# Patient Record
Sex: Female | Born: 1945 | Race: Black or African American | Hispanic: No | Smoking: Former smoker
Health system: Southern US, Community
[De-identification: ages and names within clinical notes are randomized; demographics above are authoritative.]

## PROBLEM LIST (undated history)

## (undated) DIAGNOSIS — E782 Mixed hyperlipidemia: Secondary | ICD-10-CM

## (undated) DIAGNOSIS — G4733 Obstructive sleep apnea (adult) (pediatric): Secondary | ICD-10-CM

## (undated) DIAGNOSIS — E119 Type 2 diabetes mellitus without complications: Secondary | ICD-10-CM

## (undated) DIAGNOSIS — Z9989 Dependence on other enabling machines and devices: Secondary | ICD-10-CM

## (undated) DIAGNOSIS — G4730 Sleep apnea, unspecified: Secondary | ICD-10-CM

## (undated) DIAGNOSIS — I10 Essential (primary) hypertension: Secondary | ICD-10-CM

## (undated) DIAGNOSIS — H269 Unspecified cataract: Secondary | ICD-10-CM

## (undated) DIAGNOSIS — E611 Iron deficiency: Secondary | ICD-10-CM

## (undated) DIAGNOSIS — R011 Cardiac murmur, unspecified: Secondary | ICD-10-CM

## (undated) DIAGNOSIS — D649 Anemia, unspecified: Secondary | ICD-10-CM

## (undated) DIAGNOSIS — E041 Nontoxic single thyroid nodule: Secondary | ICD-10-CM

## (undated) DIAGNOSIS — M1990 Unspecified osteoarthritis, unspecified site: Secondary | ICD-10-CM

## (undated) DIAGNOSIS — N189 Chronic kidney disease, unspecified: Secondary | ICD-10-CM

## (undated) HISTORY — DX: Obstructive sleep apnea (adult) (pediatric): G47.33

## (undated) HISTORY — DX: Unspecified cataract: H26.9

## (undated) HISTORY — DX: Anemia, unspecified: D64.9

## (undated) HISTORY — DX: Nontoxic single thyroid nodule: E04.1

## (undated) HISTORY — DX: Cardiac murmur, unspecified: R01.1

## (undated) HISTORY — DX: Essential (primary) hypertension: I10

## (undated) HISTORY — DX: Type 2 diabetes mellitus without complications: E11.9

## (undated) HISTORY — PX: THYROID SURGERY: SHX805

## (undated) HISTORY — DX: Iron deficiency: E61.1

## (undated) HISTORY — PX: EYE SURGERY: SHX253

## (undated) HISTORY — DX: Dependence on other enabling machines and devices: Z99.89

## (undated) HISTORY — PX: JOINT REPLACEMENT: SHX530

## (undated) HISTORY — DX: Mixed hyperlipidemia: E78.2

## (undated) HISTORY — DX: Chronic kidney disease, unspecified: N18.9

## (undated) HISTORY — DX: Sleep apnea, unspecified: G47.30

## (undated) HISTORY — DX: Unspecified osteoarthritis, unspecified site: M19.90

---

## 1994-10-14 HISTORY — PX: ABDOMINAL HYSTERECTOMY: SHX81

## 2003-01-03 ENCOUNTER — Encounter: Admission: RE | Admit: 2003-01-03 | Discharge: 2003-04-03 | Payer: Self-pay | Admitting: Internal Medicine

## 2003-05-24 ENCOUNTER — Encounter: Admission: RE | Admit: 2003-05-24 | Discharge: 2003-08-22 | Payer: Self-pay | Admitting: Internal Medicine

## 2006-04-10 ENCOUNTER — Emergency Department (HOSPITAL_COMMUNITY): Admission: EM | Admit: 2006-04-10 | Discharge: 2006-04-10 | Payer: Self-pay | Admitting: Emergency Medicine

## 2010-10-18 ENCOUNTER — Encounter
Admission: RE | Admit: 2010-10-18 | Discharge: 2010-10-18 | Payer: Self-pay | Source: Home / Self Care | Attending: Family Medicine | Admitting: Family Medicine

## 2010-12-10 ENCOUNTER — Other Ambulatory Visit: Payer: Self-pay | Admitting: Endocrinology

## 2010-12-10 DIAGNOSIS — E041 Nontoxic single thyroid nodule: Secondary | ICD-10-CM

## 2011-01-08 ENCOUNTER — Other Ambulatory Visit: Payer: Self-pay | Admitting: Interventional Radiology

## 2011-01-08 ENCOUNTER — Other Ambulatory Visit (HOSPITAL_COMMUNITY)
Admission: RE | Admit: 2011-01-08 | Discharge: 2011-01-08 | Disposition: A | Discharge status: Home / Self Care | Payer: PRIVATE HEALTH INSURANCE | Source: Ambulatory Visit | Attending: Interventional Radiology | Admitting: Interventional Radiology

## 2011-01-08 ENCOUNTER — Ambulatory Visit
Admission: RE | Admit: 2011-01-08 | Discharge: 2011-01-08 | Disposition: A | Discharge status: Home / Self Care | Payer: PRIVATE HEALTH INSURANCE | Source: Ambulatory Visit | Attending: Endocrinology | Admitting: Endocrinology

## 2011-01-08 DIAGNOSIS — E041 Nontoxic single thyroid nodule: Secondary | ICD-10-CM

## 2011-06-18 ENCOUNTER — Encounter (HOSPITAL_COMMUNITY)
Admission: RE | Admit: 2011-06-18 | Discharge: 2011-06-18 | Disposition: A | Discharge status: Home / Self Care | Payer: 59 | Source: Ambulatory Visit | Attending: Otolaryngology | Admitting: Otolaryngology

## 2011-06-18 ENCOUNTER — Other Ambulatory Visit (HOSPITAL_COMMUNITY): Payer: Self-pay | Admitting: Otolaryngology

## 2011-06-18 DIAGNOSIS — D34 Benign neoplasm of thyroid gland: Secondary | ICD-10-CM

## 2011-06-18 LAB — BASIC METABOLIC PANEL
BUN: 11 mg/dL (ref 6–23)
CO2: 31 mEq/L (ref 19–32)
Calcium: 10.6 mg/dL — ABNORMAL HIGH (ref 8.4–10.5)
Chloride: 97 mEq/L (ref 96–112)
Creatinine, Ser: 0.77 mg/dL (ref 0.50–1.10)
GFR calc Af Amer: >60 mL/min (ref >60)
GFR calc non Af Amer: >60 mL/min (ref >60)
Glucose, Bld: 209 mg/dL — ABNORMAL HIGH (ref 70–99)
Potassium: 3.6 mEq/L (ref 3.5–5.1)
Sodium: 137 mEq/L (ref 135–145)

## 2011-06-18 LAB — CBC
HCT: 39.9 % (ref 36.0–46.0)
Hemoglobin: 13 g/dL (ref 12.0–15.0)
MCH: 28.1 pg (ref 26.0–34.0)
MCHC: 32.6 g/dL (ref 30.0–36.0)
MCV: 86.4 fL (ref 78.0–100.0)
Platelets: 204 10*3/uL (ref 150–400)
RBC: 4.62 MIL/uL (ref 3.87–5.11)
RDW: 12.1 % (ref 11.5–15.5)
WBC: 3.2 10*3/uL — ABNORMAL LOW (ref 4.0–10.5)

## 2011-06-18 LAB — SURGICAL PCR SCREEN
MRSA, PCR: NEGATIVE
Staphylococcus aureus: NEGATIVE

## 2011-06-19 ENCOUNTER — Other Ambulatory Visit: Payer: Self-pay | Admitting: Otolaryngology

## 2011-06-19 ENCOUNTER — Ambulatory Visit (HOSPITAL_COMMUNITY)
Admission: RE | Admit: 2011-06-19 | Discharge: 2011-06-20 | Disposition: A | Discharge status: Home / Self Care | Payer: 59 | Source: Ambulatory Visit | Attending: Otolaryngology | Admitting: Otolaryngology

## 2011-06-19 DIAGNOSIS — I10 Essential (primary) hypertension: Secondary | ICD-10-CM | POA: Insufficient documentation

## 2011-06-19 DIAGNOSIS — D34 Benign neoplasm of thyroid gland: Secondary | ICD-10-CM | POA: Insufficient documentation

## 2011-06-19 DIAGNOSIS — Z01818 Encounter for other preprocedural examination: Secondary | ICD-10-CM | POA: Insufficient documentation

## 2011-06-19 DIAGNOSIS — Z01812 Encounter for preprocedural laboratory examination: Secondary | ICD-10-CM | POA: Insufficient documentation

## 2011-06-19 DIAGNOSIS — E119 Type 2 diabetes mellitus without complications: Secondary | ICD-10-CM | POA: Insufficient documentation

## 2011-06-19 DIAGNOSIS — E063 Autoimmune thyroiditis: Secondary | ICD-10-CM | POA: Insufficient documentation

## 2011-06-19 DIAGNOSIS — Z01810 Encounter for preprocedural cardiovascular examination: Secondary | ICD-10-CM | POA: Insufficient documentation

## 2011-06-19 LAB — GLUCOSE, CAPILLARY
Glucose-Capillary: 230 mg/dL — ABNORMAL HIGH (ref 70–99)
Glucose-Capillary: 223 mg/dL — ABNORMAL HIGH (ref 70–99)

## 2011-06-20 LAB — GLUCOSE, CAPILLARY
Glucose-Capillary: 223 mg/dL — ABNORMAL HIGH (ref 70–99)
Glucose-Capillary: 205 mg/dL — ABNORMAL HIGH (ref 70–99)

## 2011-07-17 NOTE — Op Note (Signed)
  Catherine Mcconnell, Catherine Mcconnell                 ACCOUNT NO.:  [id number]  MEDICAL RECORD NO.:  [id number]  LOCATION:  5121                         FACILITY:  [facility]  PHYSICIAN:  Yuri Flener H. Pollyann Kennedy, MD     DATE OF BIRTH:  [birth year]  DATE OF PROCEDURE:  06/19/2011 DATE OF DISCHARGE:                              OPERATIVE REPORT   PREOPERATIVE DIAGNOSIS:  Left thyroid mass.  POSTOPERATIVE DIAGNOSIS:  Left thyroid mass.  PROCEDURE:  Left thyroid lobectomy.  SURGEON:  Khalee Mazo H. Pollyann Kennedy, MD  ASSISTANT:  Aquilla Hacker, [facility]  BLOOD LOSS:  None.  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  No complications.  No other findings.  REFERRING PHYSICIAN:  Dorisann Frames, MD.  HISTORY:  A 65 year old lady with a thyroid mass on the left side.  FNA revealed follicular cells with atypical cytology.  Risks, benefits, alternatives, complications to the procedure were explained to the patient who seemed to understand and agreed to surgery.  PROCEDURE:  The patient was taken to the operating room, placed on the operating table in supine position.  Following induction of general endotracheal anesthesia, the table was turned.  The patient was draped in a standard fashion.  A shoulder roll was placed beneath the shoulder blades.  The neck was extended.  A low collar incision was outlined with a marking pen.  Electrocautery was used to incise the skin and subcutaneous tissue and through the platysma layer.  Subplatysmal flap was developed superiorly and inferiorly allowing adequate visualization of the thyroid gland.  Midline fascia was divided and reflected laterally to the left exposing the thyroid lobe which was then medially retracted.  The superior vasculature was ligated between clamps and divided separately.  The superior pole was brought down, the recurrent nerve was identified and preserved.  A superior and inferior parathyroid was identified  and preserved with its blood supply.  The middle thyroid vein and the inferior vasculature was also ligated between clamps and divided.  A 4-0 silk ties were used throughout.  As the gland was brought forward, ligament of Allyson Sabal was divided and the gland was dissected off the trachea.  The isthmus was divided to the right at the midline.  The specimen sent for pathologic evaluation.  The wound was irrigated with saline and hemostasis was completed.  The wound was closed in layers using 4-0 chromic on the midline fascia, the platysma layer and the subcuticular layer.  A 7-French round JP drain was left in the wound, exited through the right side of the incision, secured in place with nylon suture. Dermabond was used on the skin.  The patient was awakened, extubated, and transferred to recovery in stable condition.     Arhum Peeples H. Pollyann Kennedy, MD     JHR/MEDQ  D:  06/19/2011  T:  06/19/2011  Job:  [id number]  cc:   Dorisann Frames, M.D.  Electronically Signed by Serena Colonel MD on 07/17/2011 09:30:44 PM

## 2011-10-10 ENCOUNTER — Ambulatory Visit (INDEPENDENT_AMBULATORY_CARE_PROVIDER_SITE_OTHER): Payer: PRIVATE HEALTH INSURANCE

## 2011-10-10 DIAGNOSIS — E789 Disorder of lipoprotein metabolism, unspecified: Secondary | ICD-10-CM

## 2011-10-10 DIAGNOSIS — I10 Essential (primary) hypertension: Secondary | ICD-10-CM

## 2011-10-10 DIAGNOSIS — E119 Type 2 diabetes mellitus without complications: Secondary | ICD-10-CM

## 2012-03-13 ENCOUNTER — Ambulatory Visit (INDEPENDENT_AMBULATORY_CARE_PROVIDER_SITE_OTHER): Payer: PRIVATE HEALTH INSURANCE | Admitting: Family Medicine

## 2012-03-13 VITALS — BP 152/80 | HR 72 | Temp 98.6°F | Resp 16 | Ht 66.0 in | Wt 159.2 lb

## 2012-03-13 DIAGNOSIS — E785 Hyperlipidemia, unspecified: Secondary | ICD-10-CM

## 2012-03-13 DIAGNOSIS — I10 Essential (primary) hypertension: Secondary | ICD-10-CM

## 2012-03-13 DIAGNOSIS — E119 Type 2 diabetes mellitus without complications: Secondary | ICD-10-CM

## 2012-03-13 DIAGNOSIS — IMO0001 Reserved for inherently not codable concepts without codable children: Secondary | ICD-10-CM

## 2012-03-13 LAB — COMPREHENSIVE METABOLIC PANEL
ALT: 20 U/L (ref 0–35)
AST: 18 U/L (ref 0–37)
Albumin: 4.7 g/dL (ref 3.5–5.2)
Alkaline Phosphatase: 58 U/L (ref 39–117)
BUN: 9 mg/dL (ref 6–23)
CO2: 31 mEq/L (ref 19–32)
Calcium: 10.3 mg/dL (ref 8.4–10.5)
Chloride: 99 mEq/L (ref 96–112)
Creat: 0.84 mg/dL (ref 0.50–1.10)
Glucose, Bld: 148 mg/dL — ABNORMAL HIGH (ref 70–99)
Potassium: 3.9 mEq/L (ref 3.5–5.3)
Sodium: 138 mEq/L (ref 135–145)
Total Bilirubin: 0.5 mg/dL (ref 0.3–1.2)
Total Protein: 7.7 g/dL (ref 6.0–8.3)

## 2012-03-13 LAB — LIPID PANEL
Cholesterol: 146 mg/dL (ref 0–200)
HDL: 52 mg/dL (ref >39)
LDL Cholesterol: 85 mg/dL (ref 0–99)
Total CHOL/HDL Ratio: 2.8 Ratio
Triglycerides: 43 mg/dL (ref <150)
VLDL: 9 mg/dL (ref 0–40)

## 2012-03-13 LAB — POCT GLYCOSYLATED HEMOGLOBIN (HGB A1C): Hemoglobin A1C: 10.3

## 2012-03-13 MED ORDER — METFORMIN HCL 1000 MG PO TABS: 1000.0000 mg | ORAL_TABLET | Freq: Two times a day (BID) | ORAL | Status: DC

## 2012-03-13 MED ORDER — AMLODIPINE BESYLATE 10 MG PO TABS: 10.0000 mg | ORAL_TABLET | Freq: Every day | ORAL | Status: DC

## 2012-03-13 MED ORDER — ROSUVASTATIN CALCIUM 20 MG PO TABS: 20.0000 mg | ORAL_TABLET | Freq: Every day | ORAL | Status: DC

## 2012-03-13 MED ORDER — SITAGLIPTIN PHOSPHATE 100 MG PO TABS: 100.0000 mg | ORAL_TABLET | Freq: Every day | ORAL | Status: DC

## 2012-03-13 NOTE — Patient Instructions (Addendum)
Try to be more faithful in taking her medications.  Continue getting regular exercise  It is good for a diabetic take aspirin 81 mg one daily in addition to the other medications.  When you get to [facility] please establish yourself with a regular physician in the near future. You need better diabetic control.

## 2012-03-13 NOTE — Progress Notes (Signed)
Subjective:  she is moving to [facility] tomorrow. She came in for one last visit here before moving. She has been not very faithful in taking her medications lately. She has a history of fairly poorly controlled diabetes in the past. She says her sugars have run in the 200s.  Review of systems is really unremarkable. HEENT no headaches or dizziness or other symptoms.  Respiratory unremarkable  cardiovascular unremarkable GI unremarkable  GU unremarkable  musculoskeletal unremarkable  Objective: Pleasant alert Afro-American female in no acute distress this time. HEENT: TMs are normal. Throat clear. Eyes PERRLA. Neck supple without nodes thyromegaly. Chest clear. Heart regular without murmurs. Soft and mass or tenderness. Ankles without edema.  Assessment:  Diabetes Hyperlipidemia   Plan: Represcribed her medications. Advise compliance.   Results for orders placed in visit on 03/13/12  POCT GLYCOSYLATED HEMOGLOBIN (HGB A1C)      Component Value Range   Hemoglobin A1C 10.3

## 2012-03-17 ENCOUNTER — Encounter: Payer: Self-pay | Admitting: Family Medicine

## 2012-03-22 ENCOUNTER — Telehealth: Payer: Self-pay | Admitting: Family Medicine

## 2012-03-22 NOTE — Telephone Encounter (Signed)
Received fax from [employer] (see TL wall), needing to verify patients new address and [employer] information.  LMOM for patient to CB.

## 2012-03-22 NOTE — Telephone Encounter (Signed)
Spoke with patient and verified  [employer] id - [id number] Patients address:  [address] [place], [place] [address] Phone: [phone]  Per ppwk, need to call [employer] Monday at [phone] to verify info.  Will leave forms at [place] station to contact them tomorrow.

## 2012-03-23 NOTE — Telephone Encounter (Signed)
Called [facility] [employer] and gave them pt info. They were able to find pt and update phone and address and will contact pt first also to verify shipping address. Gave pharmacist all Rx info from Dr Frederik Pear 5/31 Rxs

## 2013-03-26 ENCOUNTER — Telehealth: Payer: Self-pay

## 2013-03-26 NOTE — Telephone Encounter (Signed)
PT HAS MOVED TO [place].  WANTS [place] TO MAIL HER AN AUTHORIZATION TO RELEASE HER MEDICAL RECORDS.  NEW ADDRESS [address] [place], [place] [address]  [phone]

## 2013-03-30 NOTE — Telephone Encounter (Signed)
Done

## 2015-10-15 HISTORY — PX: MASS EXCISION: SHX2000

## 2018-01-20 LAB — LIPID PANEL
Cholesterol: 219 — AB (ref 0–200)
HDL: 58 (ref 35–70)
LDL Cholesterol: 139

## 2018-01-20 LAB — BASIC METABOLIC PANEL
BUN: 26 — AB (ref 4–21)
Creatinine: 1.9 — AB (ref 0.5–1.1)
Glucose: 316
Potassium: 4.2 (ref 3.4–5.3)
Sodium: 136 — AB (ref 137–147)

## 2018-03-19 ENCOUNTER — Encounter: Payer: Self-pay | Admitting: Neurology

## 2018-03-23 ENCOUNTER — Encounter: Payer: Self-pay | Admitting: Neurology

## 2018-03-23 ENCOUNTER — Ambulatory Visit: Payer: Medicare Other | Admitting: Neurology

## 2018-03-23 VITALS — BP 142/70 | HR 61 | Ht 67.0 in | Wt 164.0 lb

## 2018-03-23 DIAGNOSIS — Z9989 Dependence on other enabling machines and devices: Secondary | ICD-10-CM

## 2018-03-23 DIAGNOSIS — G4733 Obstructive sleep apnea (adult) (pediatric): Secondary | ICD-10-CM

## 2018-03-23 NOTE — Progress Notes (Signed)
SLEEP MEDICINE CLINIC   Provider:  Larey Seat, M.D.   Primary Care Physician:     Referring Provider: Not established.   Chief Complaint  Patient presents with  . New Patient (Initial Visit)    pt alone, rm 10. pt is an established CPAP user. last sleep study was 04/03/2017 and was started on CPAP at that time. DME: [facility]. pt moved here from [facility].     HPI:  Catherine Mcconnell is a 72 y.o. female , seen here as in a referral for transfer of care- The patient has just moved form [place], and has seen Dr. [name] at [facility]. She has not been established with a PCP.   I have the pleasure of meeting Catherine Mcconnell today on 23 March 2018.  She moved to [place] several months ago from.  She has a history of hypertension, diabetes mellitus, hyperlipidemia, chronic kidney disease, heart murmur and was evaluated in [facility] by [facility] [place].  She has an excellent functional status she can walk about a mile without any limitation no shortness of breath is recorded.  Her EKG done at her last primary care physician's office showed left ventricular hypertrophy with normal sinus rhythm.  The patient has no symptoms attributable to valvular heart disease.  An echocardiogram on 01 February 2017 showed normal left ventricular ejection fraction of 60%.  Normal left atrial pressure, minimal tricuspid regurgitation and pulmonary regurgitation, grade 1 diastolic dysfunction.  No functional limitations.  EKG in sinus rhythm as of 09 January 2017.  [facility] also ordered a sleep study on 21 Feb 2017 , resulting in an AHI of 57.3/h with O2 desaturation to a nadir of 81% during non-REM sleep.  CPAP therapy and weight reduction were recommended.  Her cardiologist was Dr. Manuella Ghazi, MD, her primary care physician was [name] , MD.  One of Dr. Trena Platt last office notes from May 20, 2017 stated that the patient's essential  primary hypertension -was controlled on Norvasc 10 mg daily and Coreg 6.25 mg twice daily, type 2 diabetes was managed by primary care, hyperlipidemia treated with Lipitor 40 mg at night, CKD with a creatinine of 1.62 is followed now by nephrologist Dr. [name], cardiac monitor was unspecified the patient was considered not a good candidate for dehydration or diuretics.   About a year ago she had a spell of dehydration and had to go to her local [facility] emergency room where she was IV hydrated.    Obstructive sleep apnea was treated with CPAP at 11 cmH2O pressure.  I told hear from her last sleep study from 12 Mar 2017 the patient's respirations stabilized on the optimal nasal CPAP pressure of 11 cmH2O.   The AHI was reduced to 0.0/h the EKG showed sinus rhythm with a variable heart rate between 44 bpm and 71 bpm snoring was eliminated with nasal CPAP under a DreamWear mask.  No significant periodic leg movement, REM latency was 88 minutes.  She uses medium small cushions no chinstrap and heated humidity.  Interval history the patient had very good compliance data in [facility] and we were able to also interrogate her machine by Wi-Fi.  Over the 90 day period from September through November 2018 she has used the machine 100% of days, with an average user time of 8 hours and 7 minutes, CPAP is set at 11 cmH2O pressure with 3 cm expiratory pressure relief -  her residual AHI in November was 1.1.    Today's compliance record again is 100% average use of time 7 hours 48 minutes, CPAP still set at 11 cmH2O pressure with 3 cm EPR and the residual AHI is 1.7 there were no Cheyne-Stokes respiration arising under therapy.  Air leak varies night by night, her residual apnea is considered low enough to be well controlled.  She continues to feel comfortable with a DreamWear nasal pillow mask.  Chief complaint according to patient :" I need new supplies."   Sleep habits are as follows: She goes to bed around 11  Pm, usually reading for 30-40 minutes before falling asleep, the bedroom is cool, quiet and dark. The patient sleeps on her side- but moves a lot during sleep. She sleeps on 2 pillows. She sleeps alone. 2-3 nocturias each night. She rises between 7-9 AM, mostly around 8 AM.  She feel rested in AM, no headaches, no palpitations, no lightheadedness were reported..     Medical history and family sleep history: HTN and DM 2 were diagnosed 20 years ago, she was of the same weight as now. No tonsillectomy, no nasal septal deviation. Father was a heavy snorer.   Social history: divorced, 6 biological children, 2 son have passed away , middle son in January 30, 2008 following a liver transplant, the eldest in 49 with pancreatic cancer, both were veterans. Non smoker- seldomly drinking alcohol, Caffeine - consumes iced tea and coffee, not sodas. 1 cup in AM, 1 glass of tea with lunch and dinner.  Worked as Training and development officer .     Review of Systems: Out of a complete 14 system review, the patient complains of only the following symptoms, and all other reviewed systems are negative.   Catherine Mcconnell reports swelling in her legs, snoring which may have been alleviated on the CPAP, anemia, joint pain, birthmarks, nocturia, changes in appetite and restless legs.  She has a past surgical history of has to hysterectomy in 1995-01-30 thyroid surgery a biopsy revealed a benign nodule.  And she had a skin mass removed from her right upper arm in January 30, 2016 which  was not cancerous.  Epworth Sleepiness score 13/ 24 - but takes no naps.   , Fatigue severity score 36  , depression score 3/ 5    Social History   Socioeconomic History  . Marital status: Divorced    Spouse name: Not on file  . Number of children: Not on file  . Years of education: Not on file  . Highest education level: Not on file  Occupational History  . Not on file  Social Needs  . Financial resource strain: Not on file  . Food insecurity:    Worry: Not on file      Inability: Not on file  . Transportation needs:    Medical: Not on file    Non-medical: Not on file  Tobacco Use  . Smoking status: Former Research scientist (life sciences)  . Smokeless tobacco: Never Used  . Tobacco comment: only socially in high school  Substance and Sexual Activity  . Alcohol use: Not Currently  . Drug use: Never  . Sexual activity: Not on file  Lifestyle  . Physical activity:    Days per week: Not on file    Minutes per session: Not on file  . Stress: Not on file  Relationships  . Social connections:    Talks on phone: Not on file    Gets together: Not on file  Attends religious service: Not on file    Active member of club or organization: Not on file    Attends meetings of clubs or organizations: Not on file    Relationship status: Not on file  . Intimate partner violence:    Fear of current or ex partner: Not on file    Emotionally abused: Not on file    Physically abused: Not on file    Forced sexual activity: Not on file  Other Topics Concern  . Not on file  Social History Narrative  . Not on file    Family History  Problem Relation Age of Onset  . Stroke Mother   . Hypertension Mother   . Heart attack Father   . Diabetes Sister   . Prostate cancer Son     Past Medical History:  Diagnosis Date  . Chronic kidney disease   . Diabetes mellitus without complication ([place])   . Hypertension   . Mixed hyperlipidemia       Current Outpatient Medications  Medication Sig Dispense Refill  . amLODipine (NORVASC) 10 MG tablet Take 1 tablet (10 mg total) by mouth daily. 90 tablet 1  . atorvastatin (LIPITOR) 40 MG tablet Take 40 mg by mouth daily.    . carvedilol (COREG) 6.25 MG tablet Take 6.25 mg by mouth 2 (two) times daily with a meal.    . ferrous sulfate (IRON SUPPLEMENT) 325 (65 FE) MG tablet Take 325 mg by mouth 2 (two) times daily with a meal.    . glipiZIDE (GLUCOTROL) 10 MG tablet Take 10 mg by mouth daily before breakfast.    . losartan (COZAAR) 25 MG  tablet TK 1 T PO QD  2  . sitaGLIPtin (JANUVIA) 100 MG tablet Take 100 mg by mouth daily.    . traMADol (ULTRAM) 50 MG tablet Take by mouth every 6 (six) hours as needed.    . sitaGLIPtin (JANUVIA) 100 MG tablet Take 1 tablet (100 mg total) by mouth daily. 90 tablet 1   No current facility-administered medications for this visit.     Allergies as of 03/23/2018 - Review Complete 03/23/2018  Allergen Reaction Noted  . Metformin and related Swelling 03/23/2018  . Lisinopril Rash 03/23/2018    Vitals: BP (!) 142/70   Pulse 61   Ht 5\' 7"  (1.702 m)   Wt 164 lb (74.4 kg)   BMI 25.69 kg/m  Last Weight:  Wt Readings from Last 1 Encounters:  03/23/18 164 lb (74.4 kg)   NGE:XBMW mass index is 25.69 kg/m.     Last Height:   Ht Readings from Last 1 Encounters:  03/23/18 5\' 7"  (1.702 m)    Physical exam:  General: The patient is awake, alert and appears not in acute distress. The patient is well groomed. Head: Normocephalic, atraumatic. Neck is supple. Mallampati 4, full plate dentures.  neck circumference 14 . Nasal airflow patent , . Retrognathia is grade 2 .  Cardiovascular:  Regular rate and rhythm , without  murmurs or carotid bruit, and without distended neck veins. Respiratory: Lungs are clear to auscultation. Skin:  Without evidence of edema, or rash Trunk: BMI is 26. The patient's posture is erect.   Neurologic exam : The patient is awake and alert, oriented to place and time.   Memory subjective  described as intact.  Attention span & concentration ability appears normal.  Speech is fluent,  without dysarthria, dysphonia or aphasia.  Mood and affect are appropriate.  Cranial nerves: Pupils are  equal and briskly reactive to light. Funduscopic exam without  evidence of pallor or edema. No cataracts.   Extraocular movements in vertical and horizontal planes intact and without nystagmus. Visual fields by finger perimetry are intact. Hearing to finger rub intact.  Facial  sensation intact to fine touch.  Facial motor strength is symmetric and tongue and uvula move midline. Shoulder shrug was symmetrical.   Motor exam:  Normal tone, muscle bulk and symmetric strength in all extremities.  Sensory:  Fine touch, pinprick and vibration were tested in all extremities. Proprioception tested in the upper extremities was normal.  Coordination: Rapid alternating movements in the fingers/hands was normal. Finger-to-nose maneuver  normal without evidence of ataxia, dysmetria or tremor.  Gait and station: Patient walks without assistive device Deep tendon reflexes: in the  upper and lower extremities are symmetric and intact.   Assessment:  After physical and neurologic examination, review of laboratory studies,  Personal review of imaging studies, reports of other /same  Imaging studies, results of polysomnography and / or neurophysiology testing and pre-existing records as far as provided in visit., my assessment is   1) Transfer of CPAP care- will order the named supplies. This patient is a CPAP user for 13 month and has been highly compliant, feels better on CPAP, sleeps deeper.   2) EDS- she has still some residual excessive sleepiness while on CPAP . May be related to CKD, DM, HTN , anemia and related medications.    The patient was advised of the nature of the diagnosed disorder , the treatment options and the  risks for general health and wellness arising from not treating the condition.   I spent more than 35  minutes of face to face time with the patient.  Greater than 50% of time was spent in counseling and coordination of care. We have discussed the diagnosis and differential and I answered the patient's questions.    Plan:  Treatment plan and additional workup :  I will order CPAP supplies through [facility] - she lives close to [place].    CPAP supplies entail: airfit p 10  mask, small size nasal pillow- and supplies for ResMed Airsense  10 .   Larey Seat, MD 01/07/7123, 5:80 AM  Certified in Neurology by ABPN Certified in [place] by [facility] [address], [place], [place] [address]

## 2018-03-23 NOTE — Patient Instructions (Signed)

## 2018-06-08 ENCOUNTER — Other Ambulatory Visit: Payer: Self-pay | Admitting: Nurse Practitioner

## 2018-06-08 DIAGNOSIS — Z1231 Encounter for screening mammogram for malignant neoplasm of breast: Secondary | ICD-10-CM

## 2018-06-17 ENCOUNTER — Other Ambulatory Visit: Payer: Self-pay | Admitting: Nurse Practitioner

## 2018-06-17 DIAGNOSIS — E2839 Other primary ovarian failure: Secondary | ICD-10-CM

## 2018-07-09 ENCOUNTER — Ambulatory Visit
Admission: RE | Admit: 2018-07-09 | Discharge: 2018-07-09 | Disposition: A | Discharge status: Home / Self Care | Payer: Medicare Other | Source: Ambulatory Visit | Attending: Nurse Practitioner | Admitting: Nurse Practitioner

## 2018-07-09 DIAGNOSIS — Z1231 Encounter for screening mammogram for malignant neoplasm of breast: Secondary | ICD-10-CM

## 2018-07-09 LAB — HM MAMMOGRAPHY

## 2018-07-14 LAB — HM DIABETES EYE EXAM

## 2018-08-11 ENCOUNTER — Ambulatory Visit: Payer: Medicare Other | Admitting: Physician Assistant

## 2018-08-12 ENCOUNTER — Ambulatory Visit
Admission: RE | Admit: 2018-08-12 | Discharge: 2018-08-12 | Disposition: A | Discharge status: Home / Self Care | Payer: Medicare Other | Source: Ambulatory Visit | Attending: Nurse Practitioner | Admitting: Nurse Practitioner

## 2018-08-12 DIAGNOSIS — E2839 Other primary ovarian failure: Secondary | ICD-10-CM

## 2018-08-13 ENCOUNTER — Ambulatory Visit (INDEPENDENT_AMBULATORY_CARE_PROVIDER_SITE_OTHER): Payer: Medicare Other | Admitting: Physician Assistant

## 2018-08-13 ENCOUNTER — Encounter: Payer: Self-pay | Admitting: Physician Assistant

## 2018-08-13 VITALS — BP 138/88 | HR 60 | Temp 98.4°F | Resp 16 | Ht 66.0 in | Wt 167.8 lb

## 2018-08-13 DIAGNOSIS — E785 Hyperlipidemia, unspecified: Secondary | ICD-10-CM

## 2018-08-13 DIAGNOSIS — E119 Type 2 diabetes mellitus without complications: Secondary | ICD-10-CM | POA: Diagnosis not present

## 2018-08-13 DIAGNOSIS — M1990 Unspecified osteoarthritis, unspecified site: Secondary | ICD-10-CM | POA: Diagnosis not present

## 2018-08-13 DIAGNOSIS — N183 Chronic kidney disease, stage 3 unspecified: Secondary | ICD-10-CM

## 2018-08-13 DIAGNOSIS — I10 Essential (primary) hypertension: Secondary | ICD-10-CM

## 2018-08-13 LAB — LIPID PANEL
Cholesterol: 182 mg/dL (ref 0–200)
HDL: 46.7 mg/dL (ref >39.00)
LDL Cholesterol: 118 mg/dL — ABNORMAL HIGH (ref 0–99)
NonHDL: 135.3
Total CHOL/HDL Ratio: 4
Triglycerides: 86 mg/dL (ref 0.0–149.0)
VLDL: 17.2 mg/dL (ref 0.0–40.0)

## 2018-08-13 LAB — CBC WITH DIFFERENTIAL/PLATELET
Basophils Absolute: 0 10*3/uL (ref 0.0–0.1)
Basophils Relative: 0.9 % (ref 0.0–3.0)
Eosinophils Absolute: 0.2 10*3/uL (ref 0.0–0.7)
Eosinophils Relative: 5.8 % — ABNORMAL HIGH (ref 0.0–5.0)
HCT: 34.2 % — ABNORMAL LOW (ref 36.0–46.0)
Hemoglobin: 11.7 g/dL — ABNORMAL LOW (ref 12.0–15.0)
Lymphocytes Relative: 38.1 % (ref 12.0–46.0)
Lymphs Abs: 1.1 10*3/uL (ref 0.7–4.0)
MCHC: 34.3 g/dL (ref 30.0–36.0)
MCV: 86.8 fl (ref 78.0–100.0)
Monocytes Absolute: 0.2 10*3/uL (ref 0.1–1.0)
Monocytes Relative: 8.2 % (ref 3.0–12.0)
Neutro Abs: 1.3 10*3/uL — ABNORMAL LOW (ref 1.4–7.7)
Neutrophils Relative %: 47 % (ref 43.0–77.0)
Platelets: 165 10*3/uL (ref 150.0–400.0)
RBC: 3.94 Mil/uL (ref 3.87–5.11)
RDW: 13.1 % (ref 11.5–15.5)
WBC: 2.8 10*3/uL — ABNORMAL LOW (ref 4.0–10.5)

## 2018-08-13 LAB — COMPREHENSIVE METABOLIC PANEL
ALT: 15 U/L (ref 0–35)
AST: 15 U/L (ref 0–37)
Albumin: 4.1 g/dL (ref 3.5–5.2)
Alkaline Phosphatase: 57 U/L (ref 39–117)
BUN: 25 mg/dL — ABNORMAL HIGH (ref 6–23)
CO2: 30 mEq/L (ref 19–32)
Calcium: 9.2 mg/dL (ref 8.4–10.5)
Chloride: 101 mEq/L (ref 96–112)
Creatinine, Ser: 1.53 mg/dL — ABNORMAL HIGH (ref 0.40–1.20)
GFR: 42.9 mL/min — ABNORMAL LOW (ref >60.00)
Glucose, Bld: 196 mg/dL — ABNORMAL HIGH (ref 70–99)
Potassium: 3.8 mEq/L (ref 3.5–5.1)
Sodium: 136 mEq/L (ref 135–145)
Total Bilirubin: 0.6 mg/dL (ref 0.2–1.2)
Total Protein: 6.6 g/dL (ref 6.0–8.3)

## 2018-08-13 LAB — HEMOGLOBIN A1C: Hgb A1c MFr Bld: 8.6 % — ABNORMAL HIGH (ref 4.6–6.5)

## 2018-08-13 MED ORDER — CARVEDILOL 6.25 MG PO TABS: 6.2500 mg | ORAL_TABLET | Freq: Two times a day (BID) | ORAL | 2 refills | Status: DC

## 2018-08-13 MED ORDER — GLIPIZIDE 10 MG PO TABS: 10.0000 mg | ORAL_TABLET | Freq: Two times a day (BID) | ORAL | 3 refills | Status: DC

## 2018-08-13 MED ORDER — TRAMADOL HCL 50 MG PO TABS: 50.0000 mg | ORAL_TABLET | Freq: Four times a day (QID) | ORAL | 2 refills | Status: DC | PRN

## 2018-08-13 MED ORDER — ATORVASTATIN CALCIUM 40 MG PO TABS: 40.0000 mg | ORAL_TABLET | Freq: Every day | ORAL | 1 refills | Status: DC

## 2018-08-13 MED ORDER — LOSARTAN POTASSIUM 25 MG PO TABS: ORAL_TABLET | ORAL | 1 refills | Status: DC

## 2018-08-13 MED ORDER — SITAGLIPTIN PHOSPHATE 100 MG PO TABS: 100.0000 mg | ORAL_TABLET | Freq: Every day | ORAL | 1 refills | Status: DC

## 2018-08-13 MED ORDER — AMLODIPINE BESYLATE 10 MG PO TABS: 10.0000 mg | ORAL_TABLET | Freq: Every day | ORAL | 1 refills | Status: DC

## 2018-08-13 MED ORDER — FERROUS SULFATE 325 (65 FE) MG PO TABS: 325.0000 mg | ORAL_TABLET | Freq: Two times a day (BID) | ORAL | 2 refills | Status: DC

## 2018-08-13 MED ORDER — ASPIRIN EC 81 MG PO TBEC: 81.0000 mg | DELAYED_RELEASE_TABLET | Freq: Every day | ORAL | 1 refills | Status: DC

## 2018-08-13 NOTE — Progress Notes (Signed)
Catherine Mcconnell is a 72 y.o. female here for a new problem.  History of Present Illness:   Chief Complaint  Patient presents with  . Establish Care    HPI  HLD -- currently on atorvastatin 40 mg, ASA 81 mg daily. She sees cardiology. She is unable to tell me if she has had an echo, stress test, or any other imaging/tests in the past. Will request records.  HTN  -- Currently taking Amlodopine 10 mg daily, Carvedilol 6.25 mg BID, Losartan 25 mg daily. At home blood pressure readings are: 120-130/80-90. Patient denies chest pain, SOB, blurred vision, dizziness, unusual headaches, lower leg swelling. Patient is very compliant with medication. Denies excessive caffeine intake, stimulant usage, excessive alcohol intake, or increase in salt consumption.  DM -- has been diabetic for >20 years (dx at age 76 y/o). Most recent HgbA1c is in the 8's but she is unable to tell me when this was, states at least 6 months. Is currently on Januvia 100 mg and Glipizide XL10 mg BID. Denies any low blood sugars.  CKD 3 -- sees kidney doctor q 6 months or so. She tells me that she had a bad GI bug and waited too long to go to the hospital and a result has had some permanent kidney damage.  Arthritis -- takes Tramadol prn. States that she does not take it daily.  The 10-year ASCVD risk score Mikey Bussing DC Brooke Bonito., et al., 2013) is: 29.1%   Values used to calculate the score:     Age: 51 years     Sex: Female     Is Non-Hispanic African American: Yes     Diabetic: Yes     Tobacco smoker: No     Systolic Blood Pressure: 353 mmHg     Is BP treated: Yes     HDL Cholesterol: 46.7 mg/dL     Total Cholesterol: 182 mg/dL   Past Medical History:  Diagnosis Date  . Arthritis   . Chronic kidney disease   . Diabetes mellitus without complication ([facility])   . Hypertension   . Iron deficiency    on oral iron supplements, has not required transfusion  . Mixed hyperlipidemia   . OSA on CPAP    compliant  . Thyroid nodule     removed     Social History   Socioeconomic History  . Marital status: Divorced    Spouse name: Not on file  . Number of children: 6  . Years of education: some college  . Highest education level: Not on file  Occupational History  . Not on file  Social Needs  . Financial resource strain: Not on file  . Food insecurity:    Worry: Not on file    Inability: Not on file  . Transportation needs:    Medical: Not on file    Non-medical: Not on file  Tobacco Use  . Smoking status: Former Research scientist (life sciences)  . Smokeless tobacco: Never Used  . Tobacco comment: only socially in high school  Substance and Sexual Activity  . Alcohol use: Not Currently  . Drug use: Never  . Sexual activity: Not on file  Lifestyle  . Physical activity:    Days per week: Not on file    Minutes per session: Not on file  . Stress: Not on file  Relationships  . Social connections:    Talks on phone: Not on file    Gets together: Not on file    Attends religious service:  Not on file    Active member of club or organization: Not on file    Attends meetings of clubs or organizations: Not on file    Relationship status: Not on file  . Intimate partner violence:    Fear of current or ex partner: Not on file    Emotionally abused: Not on file    Physically abused: Not on file    Forced sexual activity: Not on file  Other Topics Concern  . Not on file  Social History Narrative   Mother of 5   --Two children have passed, one from from prostate cancer and one from liver failure   Used to be a CNA, [employer]   Recently moved back to [place]    Past Surgical History:  Procedure Laterality Date  . ABDOMINAL HYSTERECTOMY  1996  . MASS EXCISION Right 2017   right arm mass removal  . THYROID SURGERY     left side removal benign nodule    Family History  Problem Relation Age of Onset  . Stroke Mother   . Hypertension Mother   . Heart disease Mother   . Heart attack Father   . Early death Father   .  Hypertension Father   . Diabetes Sister   . Early death Sister   . Hypertension Sister   . Stroke Sister   . Prostate cancer Son   . Cancer Son   . Early death Son   . Breast cancer Cousin   . Diabetes Sister   . Early death Sister   . Hyperlipidemia Sister   . Kidney disease Sister   . Depression Daughter   . Hypertension Daughter   . Hyperlipidemia Daughter   . Diabetes Sister   . Hyperlipidemia Sister   . Hypertension Sister   . Early death Brother   . Arthritis Brother   . Heart attack Brother   . Heart disease Brother   . Arthritis Brother     Allergies  Allergen Reactions  . Metformin And Related Swelling  . Lisinopril Rash    Current Medications:   Current Outpatient Medications:  .  amLODipine (NORVASC) 10 MG tablet, Take 1 tablet (10 mg total) by mouth daily., Disp: 90 tablet, Rfl: 1 .  aspirin EC 81 MG tablet, Take 1 tablet (81 mg total) by mouth daily., Disp: 90 tablet, Rfl: 1 .  atorvastatin (LIPITOR) 40 MG tablet, Take 1 tablet (40 mg total) by mouth daily., Disp: 90 tablet, Rfl: 1 .  carvedilol (COREG) 6.25 MG tablet, Take 1 tablet (6.25 mg total) by mouth 2 (two) times daily with a meal., Disp: 60 tablet, Rfl: 2 .  ferrous sulfate (IRON SUPPLEMENT) 325 (65 FE) MG tablet, Take 1 tablet (325 mg total) by mouth 2 (two) times daily with a meal., Disp: 60 tablet, Rfl: 2 .  losartan (COZAAR) 25 MG tablet, TK 1 T PO QD, Disp: 90 tablet, Rfl: 1 .  sitaGLIPtin (JANUVIA) 100 MG tablet, Take 1 tablet (100 mg total) by mouth daily., Disp: 90 tablet, Rfl: 1 .  traMADol (ULTRAM) 50 MG tablet, Take 1 tablet (50 mg total) by mouth every 6 (six) hours as needed., Disp: 30 tablet, Rfl: 2 .  vitamin B-12 (CYANOCOBALAMIN) 500 MCG tablet, Take 500 mcg by mouth daily., Disp: , Rfl:  .  glipiZIDE (GLUCOTROL) 10 MG tablet, Take 1 tablet (10 mg total) by mouth 2 (two) times daily before a meal., Disp: 60 tablet, Rfl: 3   Review of Systems:  ROS  Negative unless otherwise  specified per HPI.   Vitals:   Vitals:   08/13/18 0835  BP: 138/88  Pulse: 60  Resp: 16  Temp: 98.4 F (36.9 C)  TempSrc: Oral  SpO2: 100%  Weight: 167 lb 12.8 oz (76.1 kg)  Height: 5\' 6"  (1.676 m)     Body mass index is 27.08 kg/m.  Physical Exam:   Physical Exam  Constitutional: She appears well-developed. She is cooperative.  Non-toxic appearance. She does not have a sickly appearance. She does not appear ill. No distress.  Cardiovascular: Normal rate, regular rhythm, S1 normal, S2 normal, normal heart sounds and normal pulses.  No LE edema  Pulmonary/Chest: Effort normal and breath sounds normal.  Neurological: She is alert. GCS eye subscore is 4. GCS verbal subscore is 5. GCS motor subscore is 6.  Skin: Skin is warm, dry and intact.  Psychiatric: She has a normal mood and affect. Her speech is normal and behavior is normal.  Nursing note and vitals reviewed.   Assessment and Plan:    Catherine Mcconnell was seen today for establish care.  Diagnoses and all orders for this visit:  Hyperlipidemia, unspecified hyperlipidemia type Continue lipitor 40 mg. -     Lipid panel  Hypertension Well controlled. Continue current regimen. Requesting cardiology records. -     amLODipine (NORVASC) 10 MG tablet; Take 1 tablet (10 mg total) by mouth daily. -     CBC with Differential/Platelet -     Comprehensive metabolic panel  Diabetes mellitus without complication ([place]) Refer to dietitian. Decrease Januvia to 50 mg due to CKD stage III. Continue Glipizide 10 mg BID. Follow-up in 3 months. -     Hemoglobin A1c  Arthritis She signed pain contract today. Discussed that if she requires more medication than I have sent in, we will have to go to pain management. She verbalized understanding.  CKD (chronic kidney disease) stage 3, GFR 30-59 ml/min (HCC) Management per renal. We do need to adjust her Januvia down to 50 mg.  Other orders -     traMADol (ULTRAM) 50 MG tablet; Take 1 tablet  (50 mg total) by mouth every 6 (six) hours as needed. -     sitaGLIPtin (JANUVIA) 100 MG tablet; Take 1 tablet (100 mg total) by mouth daily. -     losartan (COZAAR) 25 MG tablet; TK 1 T PO QD -     ferrous sulfate (IRON SUPPLEMENT) 325 (65 FE) MG tablet; Take 1 tablet (325 mg total) by mouth 2 (two) times daily with a meal. -     carvedilol (COREG) 6.25 MG tablet; Take 1 tablet (6.25 mg total) by mouth 2 (two) times daily with a meal. -     glipiZIDE (GLUCOTROL) 10 MG tablet; Take 1 tablet (10 mg total) by mouth 2 (two) times daily before a meal. -     atorvastatin (LIPITOR) 40 MG tablet; Take 1 tablet (40 mg total) by mouth daily. -     aspirin EC 81 MG tablet; Take 1 tablet (81 mg total) by mouth daily.  . Reviewed expectations re: course of current medical issues. . Discussed self-management of symptoms. . Outlined signs and symptoms indicating need for more acute intervention. . Patient verbalized understanding and all questions were answered. . See orders for this visit as documented in the electronic medical record. . Patient received an After-Visit Summary.   Inda Coke, PA-C

## 2018-08-14 ENCOUNTER — Other Ambulatory Visit: Payer: Self-pay | Admitting: Physician Assistant

## 2018-08-14 ENCOUNTER — Encounter: Payer: Self-pay | Admitting: Physician Assistant

## 2018-08-14 MED ORDER — SITAGLIPTIN PHOSPHATE 50 MG PO TABS: 50.0000 mg | ORAL_TABLET | Freq: Every day | ORAL | 0 refills | Status: DC

## 2018-08-21 ENCOUNTER — Other Ambulatory Visit: Payer: Self-pay | Admitting: Physician Assistant

## 2018-08-21 ENCOUNTER — Other Ambulatory Visit: Payer: Self-pay | Admitting: Nurse Practitioner

## 2018-08-27 ENCOUNTER — Telehealth: Payer: Self-pay

## 2018-08-27 NOTE — Telephone Encounter (Signed)
Patient called to cancel her appointment and declined to reschedule it at this time. YRL,RMA

## 2018-08-31 ENCOUNTER — Ambulatory Visit: Payer: Medicare Other | Admitting: Nurse Practitioner

## 2018-09-15 LAB — HM DIABETES EYE EXAM

## 2018-09-16 ENCOUNTER — Encounter: Payer: Self-pay | Admitting: Physician Assistant

## 2018-09-16 ENCOUNTER — Ambulatory Visit (INDEPENDENT_AMBULATORY_CARE_PROVIDER_SITE_OTHER): Payer: Medicare Other | Admitting: Physician Assistant

## 2018-09-16 VITALS — BP 152/70 | HR 62 | Temp 98.0°F | Resp 16 | Wt 168.0 lb

## 2018-09-16 DIAGNOSIS — Z713 Dietary counseling and surveillance: Secondary | ICD-10-CM | POA: Diagnosis not present

## 2018-09-16 DIAGNOSIS — E119 Type 2 diabetes mellitus without complications: Secondary | ICD-10-CM | POA: Diagnosis not present

## 2018-09-16 NOTE — Patient Instructions (Addendum)
It was so great to see you!  1. Add veggies at lunch 2. Watch starch portions at dinner (think about a computer mouse when portioning out your spaghetti!) 3. Add protein at breakfast 4. Add protein with dinner at snack 5. Work on Research officer, political party

## 2018-09-16 NOTE — Progress Notes (Signed)
Catherine Mcconnell is a 73 y.o. female here for a follow up of a pre-existing problem.  History of Present Illness:   Chief Complaint  Patient presents with  . nutrition visit  . Diabetes    HPI  Patient is here to discuss diet and nutrition.   Lab Results  Component Value Date   HGBA1C 8.6 (H) 08/13/2018     Dietary recall: Wakes up at 7:30 Breakfast -- oatmeal with honey, coffee with creamer Lunch -- sandwich (fried bologna) [employer] bread and chips (sour cream and onion or BBQ), applesauce or a fruit Dinner -- meat; spaghetti, shepherds pie, soup Snacks -- small bowl of ice cream or graham crackers, applesauce  Beverages  -- water  Weight: Wt Readings from Last 3 Encounters:  09/16/18 168 lb (76.2 kg)  08/13/18 167 lb 12.8 oz (76.1 kg)  03/23/18 164 lb (74.4 kg)    Exercise: Walking in the house for about 20 minutes  Support system: daughter  Sleep: Good sleep  Goals: 1- lose 5 lb 2- eat more balanced 3- slowly come off medications  Estimated daily energy needs: Calories: 1500-1700 kcal Protein: 50-65 g Fluid: 2000 ml  Past Medical History:  Diagnosis Date  . Arthritis   . Chronic kidney disease   . Diabetes mellitus without complication ([place])   . Hypertension   . Iron deficiency    on oral iron supplements, has not required transfusion  . Mixed hyperlipidemia   . OSA on CPAP    compliant  . Thyroid nodule    removed     Social History   Socioeconomic History  . Marital status: Divorced    Spouse name: Not on file  . Number of children: 6  . Years of education: some college  . Highest education level: Not on file  Occupational History  . Not on file  Social Needs  . Financial resource strain: Not on file  . Food insecurity:    Worry: Not on file    Inability: Not on file  . Transportation needs:    Medical: Not on file    Non-medical: Not on file  Tobacco Use  . Smoking status: Former Research scientist (life sciences)  . Smokeless tobacco: Never Used  .  Tobacco comment: only socially in high school  Substance and Sexual Activity  . Alcohol use: Not Currently  . Drug use: Never  . Sexual activity: Not on file  Lifestyle  . Physical activity:    Days per week: Not on file    Minutes per session: Not on file  . Stress: Not on file  Relationships  . Social connections:    Talks on phone: Not on file    Gets together: Not on file    Attends religious service: Not on file    Active member of club or organization: Not on file    Attends meetings of clubs or organizations: Not on file    Relationship status: Not on file  . Intimate partner violence:    Fear of current or ex partner: Not on file    Emotionally abused: Not on file    Physically abused: Not on file    Forced sexual activity: Not on file  Other Topics Concern  . Not on file  Social History Narrative   Mother of 53   --Two children have passed, one from from prostate cancer and one from liver failure   Used to be a CNA, [employer]   Recently moved back to [place]  Past Surgical History:  Procedure Laterality Date  . ABDOMINAL HYSTERECTOMY  1996  . MASS EXCISION Right 2017   right arm mass removal  . THYROID SURGERY     left side removal benign nodule    Family History  Problem Relation Age of Onset  . Stroke Mother   . Hypertension Mother   . Heart disease Mother   . Heart attack Father   . Early death Father   . Hypertension Father   . Diabetes Sister   . Early death Sister   . Hypertension Sister   . Stroke Sister   . Prostate cancer Son   . Cancer Son   . Early death Son   . Breast cancer Cousin   . Diabetes Sister   . Early death Sister   . Hyperlipidemia Sister   . Kidney disease Sister   . Depression Daughter   . Hypertension Daughter   . Hyperlipidemia Daughter   . Diabetes Sister   . Hyperlipidemia Sister   . Hypertension Sister   . Early death Brother   . Arthritis Brother   . Heart attack Brother   . Heart disease Brother   . Arthritis  Brother     Allergies  Allergen Reactions  . Metformin And Related Swelling  . Lisinopril Rash    Current Medications:   Current Outpatient Medications:  .  amLODipine (NORVASC) 10 MG tablet, Take 1 tablet (10 mg total) by mouth daily., Disp: 90 tablet, Rfl: 1 .  aspirin EC 81 MG tablet, Take 1 tablet (81 mg total) by mouth daily., Disp: 90 tablet, Rfl: 1 .  atorvastatin (LIPITOR) 40 MG tablet, Take 1 tablet (40 mg total) by mouth daily., Disp: 90 tablet, Rfl: 1 .  carvedilol (COREG) 6.25 MG tablet, TAKE 1 TABLET BY MOUTH TWO  TIMES DAILY WITH A MEAL, Disp: 180 tablet, Rfl: 0 .  ferrous sulfate (IRON SUPPLEMENT) 325 (65 FE) MG tablet, Take 1 tablet (325 mg total) by mouth 2 (two) times daily with a meal., Disp: 60 tablet, Rfl: 2 .  glipiZIDE (GLUCOTROL) 10 MG tablet, Take 1 tablet (10 mg total) by mouth 2 (two) times daily before a meal., Disp: 60 tablet, Rfl: 3 .  JANUVIA 50 MG tablet, TAKE 1 TABLET BY MOUTH  DAILY, Disp: 90 tablet, Rfl: 0 .  Lancets (ONETOUCH ULTRASOFT) lancets, CHECK TWICE PER DAY BEFORE  BREAKFAST AND DINNER, Disp: 200 each, Rfl: 1 .  losartan (COZAAR) 25 MG tablet, TK 1 T PO QD, Disp: 90 tablet, Rfl: 1 .  traMADol (ULTRAM) 50 MG tablet, Take 1 tablet (50 mg total) by mouth every 6 (six) hours as needed., Disp: 30 tablet, Rfl: 2 .  vitamin B-12 (CYANOCOBALAMIN) 500 MCG tablet, Take 500 mcg by mouth daily., Disp: , Rfl:  .  glucose blood (ONE TOUCH ULTRA TEST) test strip, Use to test blood sugars 2 times daily., Disp: , Rfl:  .  LUMIGAN 0.01 % SOLN, Place 1 drop into both eyes every evening., Disp: , Rfl:    Review of Systems:   ROS  Negative unless otherwise specified per HPI.  Vitals:   Vitals:   09/16/18 0757  BP: (!) 152/70  Pulse: 62  Resp: 16  Temp: 98 F (36.7 C)  TempSrc: Oral  SpO2: 98%  Weight: 168 lb (76.2 kg)     Body mass index is 27.12 kg/m.  Physical Exam:   Physical Exam  Constitutional: She is oriented to person, place, and time.  She  appears well-developed and well-nourished.  HENT:  Head: Normocephalic and atraumatic.  Eyes: Conjunctivae and EOM are normal.  Neck: Normal range of motion. Neck supple.  Pulmonary/Chest: Effort normal.  Musculoskeletal: Normal range of motion.  Neurological: She is alert and oriented to person, place, and time.  Skin: Skin is warm and dry.  Psychiatric: She has a normal mood and affect. Her behavior is normal. Judgment and thought content normal.    Assessment and Plan:    Alvin was seen today for nutrition visit and diabetes.  Diagnoses and all orders for this visit:  Encounter for nutritional counseling  Diabetes mellitus without complication ([place])   Discussed specific, individualized recommendations regarding nutrition including decreasing sugary beverages, limiting portions, balancing out meals, and eating regularly throughout the day. Handouts provided included: Balanced Plate, Balanced Snack List, Balanced Breakfast, 1800 Calorie Sample Menus. Provided emotional support and encouraged slow, steady weight loss. Patient's questions answered throughout encounter. Follow-up with me prn.  . Reviewed expectations re: course of current medical issues. . Discussed self-management of symptoms. . Outlined signs and symptoms indicating need for more acute intervention. . Patient verbalized understanding and all questions were answered. . See orders for this visit as documented in the electronic medical record. . Patient received an After-Visit Summary.  Inda Coke, PA-C

## 2018-11-12 NOTE — Progress Notes (Signed)
Catherine Mcconnell is a 73 y.o. female is here to discuss: Diabetes   I acted as a Education administrator for [employer], PA-C Anselmo Pickler, LPN   History of Present Illness:   Chief Complaint  Patient presents with  . Diabetes    Diabetes  She presents for her follow-up diabetic visit. She has type 2 diabetes mellitus. No MedicAlert identification noted. Her disease course has been fluctuating. There are no hypoglycemic associated symptoms. Pertinent negatives for hypoglycemia include no dizziness or headaches. Pertinent negatives for diabetes include no blurred vision, no chest pain, no foot paresthesias, no foot ulcerations, no visual change and no weight loss. There are no hypoglycemic complications. Symptoms are stable. She is compliant with treatment all of the time. Her weight is stable. She is following a low fat/cholesterol diet. Meal planning includes avoidance of concentrated sweets. She has not had a previous visit with a dietitian. She participates in exercise intermittently. (Pt is checking blood sugars twice a day, first in the AM and then evening. Sugars ranging 130's to 240.) An ACE inhibitor/angiotensin II receptor blocker is being taken. She does not see a podiatrist.Eye exam is current.   She is currently on Januvia 50 mg daily and Glipizide 10 mg BID.  Denies any hypoglycemia.  Admits to some dietary indiscretion since she last saw me.   Health Maintenance Due  Topic Date Due  . Hepatitis C Screening  1946/09/03  . FOOT EXAM  07/15/1956  . COLONOSCOPY  07/15/1996  . PNA vac Low Risk Adult (1 of 2 - PCV13) 07/16/2011    Past Medical History:  Diagnosis Date  . Arthritis   . Chronic kidney disease   . Diabetes mellitus without complication ([place])   . Hypertension   . Iron deficiency    on oral iron supplements, has not required transfusion  . Mixed hyperlipidemia   . OSA on CPAP    compliant  . Thyroid nodule    removed     Social History   Socioeconomic  History  . Marital status: Divorced    Spouse name: Not on file  . Number of children: 6  . Years of education: some college  . Highest education level: Not on file  Occupational History  . Not on file  Social Needs  . Financial resource strain: Not on file  . Food insecurity:    Worry: Not on file    Inability: Not on file  . Transportation needs:    Medical: Not on file    Non-medical: Not on file  Tobacco Use  . Smoking status: Former Research scientist (life sciences)  . Smokeless tobacco: Never Used  . Tobacco comment: only socially in high school  Substance and Sexual Activity  . Alcohol use: Not Currently  . Drug use: Never  . Sexual activity: Not on file  Lifestyle  . Physical activity:    Days per week: Not on file    Minutes per session: Not on file  . Stress: Not on file  Relationships  . Social connections:    Talks on phone: Not on file    Gets together: Not on file    Attends religious service: Not on file    Active member of club or organization: Not on file    Attends meetings of clubs or organizations: Not on file    Relationship status: Not on file  . Intimate partner violence:    Fear of current or ex partner: Not on file    Emotionally  abused: Not on file    Physically abused: Not on file    Forced sexual activity: Not on file  Other Topics Concern  . Not on file  Social History Narrative   Mother of 46   --Two children have passed, one from from prostate cancer and one from liver failure   Used to be a CNA, [employer]   Recently moved back to [place]    Past Surgical History:  Procedure Laterality Date  . ABDOMINAL HYSTERECTOMY  1996  . MASS EXCISION Right 2017   right arm mass removal  . THYROID SURGERY     left side removal benign nodule    Family History  Problem Relation Age of Onset  . Stroke Mother   . Hypertension Mother   . Heart disease Mother   . Heart attack Father   . Early death Father   . Hypertension Father   . Diabetes Sister   . Early death  Sister   . Hypertension Sister   . Stroke Sister   . Prostate cancer Son   . Cancer Son   . Early death Son   . Breast cancer Cousin   . Diabetes Sister   . Early death Sister   . Hyperlipidemia Sister   . Kidney disease Sister   . Depression Daughter   . Hypertension Daughter   . Hyperlipidemia Daughter   . Diabetes Sister   . Hyperlipidemia Sister   . Hypertension Sister   . Early death Brother   . Arthritis Brother   . Heart attack Brother   . Heart disease Brother   . Arthritis Brother     PMHx, SurgHx, SocialHx, FamHx, Medications, and Allergies were reviewed in the Visit Navigator and updated as appropriate.   Patient Active Problem List   Diagnosis Date Noted  . Diabetes mellitus without complication ([place]) 38/75/6433  . OSA on CPAP   . Chronic kidney disease   . Hypertension   . Mixed hyperlipidemia     Social History   Tobacco Use  . Smoking status: Former Research scientist (life sciences)  . Smokeless tobacco: Never Used  . Tobacco comment: only socially in high school  Substance Use Topics  . Alcohol use: Not Currently  . Drug use: Never    Current Medications and Allergies:    Current Outpatient Medications:  .  amLODipine (NORVASC) 10 MG tablet, Take 1 tablet (10 mg total) by mouth daily., Disp: 90 tablet, Rfl: 1 .  aspirin EC 81 MG tablet, Take 1 tablet (81 mg total) by mouth daily., Disp: 90 tablet, Rfl: 1 .  atorvastatin (LIPITOR) 40 MG tablet, Take 1 tablet (40 mg total) by mouth daily., Disp: 90 tablet, Rfl: 1 .  carvedilol (COREG) 6.25 MG tablet, TAKE 1 TABLET BY MOUTH TWO  TIMES DAILY WITH A MEAL, Disp: 180 tablet, Rfl: 0 .  ferrous sulfate (IRON SUPPLEMENT) 325 (65 FE) MG tablet, Take 1 tablet (325 mg total) by mouth 2 (two) times daily with a meal., Disp: 60 tablet, Rfl: 2 .  glipiZIDE (GLUCOTROL) 10 MG tablet, Take 1 tablet (10 mg total) by mouth 2 (two) times daily before a meal., Disp: 60 tablet, Rfl: 3 .  glucose blood (ONE TOUCH ULTRA TEST) test strip, Use to  test blood sugars 2 times daily., Disp: , Rfl:  .  JANUVIA 50 MG tablet, TAKE 1 TABLET BY MOUTH  DAILY, Disp: 90 tablet, Rfl: 0 .  Lancets (ONETOUCH ULTRASOFT) lancets, CHECK TWICE PER DAY BEFORE  BREAKFAST AND DINNER,  Disp: 200 each, Rfl: 1 .  losartan (COZAAR) 25 MG tablet, TK 1 T PO QD, Disp: 90 tablet, Rfl: 1 .  LUMIGAN 0.01 % SOLN, Place 1 drop into both eyes every evening., Disp: , Rfl:  .  traMADol (ULTRAM) 50 MG tablet, Take 1 tablet (50 mg total) by mouth every 6 (six) hours as needed., Disp: 30 tablet, Rfl: 2 .  vitamin B-12 (CYANOCOBALAMIN) 500 MCG tablet, Take 500 mcg by mouth daily., Disp: , Rfl:    Allergies  Allergen Reactions  . Metformin And Related Swelling  . Lisinopril Rash    Review of Systems   Review of Systems  Constitutional: Negative for chills, fever, malaise/fatigue and weight loss.  Eyes: Negative for blurred vision.  Respiratory: Negative for shortness of breath.   Cardiovascular: Negative for chest pain, orthopnea, claudication and leg swelling.  Gastrointestinal: Negative for heartburn, nausea and vomiting.  Neurological: Negative for dizziness, tingling and headaches.    Vitals:   Vitals:   11/13/18 0833  BP: 140/80  Pulse: (!) 56  Temp: 98.7 F (37.1 C)  TempSrc: Oral  SpO2: 99%  Weight: 165 lb 8 oz (75.1 kg)  Height: 5\' 6"  (1.676 m)     Body mass index is 26.71 kg/m.   Physical Exam:    Physical Exam Vitals signs and nursing note reviewed.  Constitutional:      General: She is not in acute distress.    Appearance: She is well-developed. She is not ill-appearing or toxic-appearing.  Cardiovascular:     Rate and Rhythm: Normal rate and regular rhythm.     Pulses: Normal pulses.     Heart sounds: Normal heart sounds, S1 normal and S2 normal.     Comments: No LE edema Pulmonary:     Effort: Pulmonary effort is normal.     Breath sounds: Normal breath sounds.  Skin:    General: Skin is warm and dry.  Neurological:     Mental  Status: She is alert.     GCS: GCS eye subscore is 4. GCS verbal subscore is 5. GCS motor subscore is 6.  Psychiatric:        Speech: Speech normal.        Behavior: Behavior normal. Behavior is cooperative.      Assessment and Plan:    Maragret was seen today for diabetes.  Diagnoses and all orders for this visit:  Diabetes mellitus without complication ([place]); Stage 3 chronic kidney disease ([place]) Poorly controlled. We did discuss that her goal is closer to 7-8, and not necessarily 6.5 due to her age. If kidney function allows, we will increase Januvia to 100 mg. Otherwise, our options include patient working harder on diet and exercise, considering switching out Januvia to [employer] if insurance allows. -     POCT glycosylated hemoglobin (Hb A1C) -     Basic metabolic panel   . Reviewed expectations re: course of current medical issues. . Discussed self-management of symptoms. . Outlined signs and symptoms indicating need for more acute intervention. . Patient verbalized understanding and all questions were answered. . See orders for this visit as documented in the electronic medical record. . Patient received an After Visit Summary.  CMA or LPN served as scribe during this visit. History, Physical, and Plan performed by medical provider. The above documentation has been reviewed and is accurate and complete.  Inda Coke, PA-C [place] 11/13/2018  Follow-up: Return in 13 weeks (on 02/12/2019) for Diabetes F/U.

## 2018-11-13 ENCOUNTER — Encounter: Payer: Self-pay | Admitting: Physician Assistant

## 2018-11-13 ENCOUNTER — Ambulatory Visit (INDEPENDENT_AMBULATORY_CARE_PROVIDER_SITE_OTHER): Payer: Medicare Other | Admitting: Physician Assistant

## 2018-11-13 VITALS — BP 140/80 | HR 56 | Temp 98.7°F | Ht 66.0 in | Wt 165.5 lb

## 2018-11-13 DIAGNOSIS — N183 Chronic kidney disease, stage 3 unspecified: Secondary | ICD-10-CM

## 2018-11-13 DIAGNOSIS — Z23 Encounter for immunization: Secondary | ICD-10-CM | POA: Diagnosis not present

## 2018-11-13 DIAGNOSIS — E119 Type 2 diabetes mellitus without complications: Secondary | ICD-10-CM

## 2018-11-13 DIAGNOSIS — G4733 Obstructive sleep apnea (adult) (pediatric): Secondary | ICD-10-CM | POA: Insufficient documentation

## 2018-11-13 DIAGNOSIS — Z9989 Dependence on other enabling machines and devices: Secondary | ICD-10-CM

## 2018-11-13 DIAGNOSIS — N189 Chronic kidney disease, unspecified: Secondary | ICD-10-CM | POA: Insufficient documentation

## 2018-11-13 DIAGNOSIS — E782 Mixed hyperlipidemia: Secondary | ICD-10-CM | POA: Insufficient documentation

## 2018-11-13 DIAGNOSIS — I10 Essential (primary) hypertension: Secondary | ICD-10-CM | POA: Insufficient documentation

## 2018-11-13 LAB — BASIC METABOLIC PANEL
BUN: 18 mg/dL (ref 6–23)
CO2: 29 mEq/L (ref 19–32)
Calcium: 9.4 mg/dL (ref 8.4–10.5)
Chloride: 101 mEq/L (ref 96–112)
Creatinine, Ser: 1.4 mg/dL — ABNORMAL HIGH (ref 0.40–1.20)
GFR: 44.68 mL/min — ABNORMAL LOW (ref >60.00)
Glucose, Bld: 180 mg/dL — ABNORMAL HIGH (ref 70–99)
Potassium: 3.9 mEq/L (ref 3.5–5.1)
Sodium: 136 mEq/L (ref 135–145)

## 2018-11-13 LAB — POCT GLYCOSYLATED HEMOGLOBIN (HGB A1C): Hemoglobin A1C: 9 % — AB (ref 4.0–5.6)

## 2018-11-13 NOTE — Patient Instructions (Signed)
It was great to see you!  I will call you with your lab results and a recommendation for a plan.   Take care,  Inda Coke PA-C

## 2018-11-26 LAB — HM DIABETES EYE EXAM

## 2018-12-02 ENCOUNTER — Ambulatory Visit (INDEPENDENT_AMBULATORY_CARE_PROVIDER_SITE_OTHER): Payer: Medicare Other | Admitting: Physician Assistant

## 2018-12-02 ENCOUNTER — Encounter: Payer: Self-pay | Admitting: Physician Assistant

## 2018-12-02 VITALS — BP 138/70 | HR 88 | Temp 98.3°F | Ht 66.0 in | Wt 167.0 lb

## 2018-12-02 DIAGNOSIS — E119 Type 2 diabetes mellitus without complications: Secondary | ICD-10-CM

## 2018-12-02 DIAGNOSIS — M1990 Unspecified osteoarthritis, unspecified site: Secondary | ICD-10-CM

## 2018-12-02 MED ORDER — DULAGLUTIDE 0.75 MG/0.5ML ~~LOC~~ SOAJ: 0.7500 mg | SUBCUTANEOUS | 0 refills | Status: DC

## 2018-12-02 MED ORDER — DULAGLUTIDE 0.75 MG/0.5ML ~~LOC~~ SOAJ: 0.7500 mg | SUBCUTANEOUS | 1 refills | Status: DC

## 2018-12-02 NOTE — Progress Notes (Signed)
Catherine Mcconnell is a 73 y.o. female is here to follow up on Diabetes.  I as a scribe for [employer], PA-C Anselmo Pickler, LPN  History of Present Illness:   Chief Complaint  Patient presents with  . Diabetes    Medication follow up    HPI   Diabetes Pt here for follow up on medication Trulicity, Pt tolerating medication well. She is checking blood sugar twice a day, on average in the morning 140-160 and 160-170 in the evening.  Wt Readings from Last 3 Encounters:  12/02/18 167 lb (75.8 kg)  11/13/18 165 lb 8 oz (75.1 kg)  09/16/18 168 lb (76.2 kg)   Arthritis Patient reports issues with pain control at times, especially with rainy and colder weather. Has R hip arthritis. Takes Tylenol Arthritis prn and if insufficient for pain control, will also try Tramadol. Denies: numbness, tingling, lower leg swelling.    Health Maintenance Due  Topic Date Due  . Hepatitis C Screening  1946-01-29  . FOOT EXAM  07/15/1956  . COLONOSCOPY  07/15/1996    Past Medical History:  Diagnosis Date  . Arthritis   . Chronic kidney disease   . Diabetes mellitus without complication ([place])   . Hypertension   . Iron deficiency    on oral iron supplements, has not required transfusion  . Mixed hyperlipidemia   . OSA on CPAP    compliant  . Thyroid nodule    removed     Social History   Socioeconomic History  . Marital status: Divorced    Spouse name: Not on file  . Number of children: 6  . Years of education: some college  . Highest education level: Not on file  Occupational History  . Not on file  Social Needs  . Financial resource strain: Not on file  . Food insecurity:    Worry: Not on file    Inability: Not on file  . Transportation needs:    Medical: Not on file    Non-medical: Not on file  Tobacco Use  . Smoking status: Former Research scientist (life sciences)  . Smokeless tobacco: Never Used  . Tobacco comment: only socially in high school  Substance and Sexual Activity  . Alcohol  use: Not Currently  . Drug use: Never  . Sexual activity: Not on file  Lifestyle  . Physical activity:    Days per week: Not on file    Minutes per session: Not on file  . Stress: Not on file  Relationships  . Social connections:    Talks on phone: Not on file    Gets together: Not on file    Attends religious service: Not on file    Active member of club or organization: Not on file    Attends meetings of clubs or organizations: Not on file    Relationship status: Not on file  . Intimate partner violence:    Fear of current or ex partner: Not on file    Emotionally abused: Not on file    Physically abused: Not on file    Forced sexual activity: Not on file  Other Topics Concern  . 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 ABDOMINAL HYSTERECTOMY  1996  .  MASS EXCISION Right 2017   right arm mass removal  . THYROID SURGERY     left side removal benign nodule    Family History  Problem Relation Age of Onset  . Stroke Mother   . Hypertension Mother   . Heart disease Mother   . Heart attack Father   . Early death Father   . Hypertension Father   . Diabetes Sister   . Early death Sister   . Hypertension Sister   . Stroke Sister   . Prostate cancer Son   . Cancer Son   . Early death Son   . Breast cancer Cousin   . Diabetes Sister   . Early death Sister   . Hyperlipidemia Sister   . Kidney disease Sister   . Depression Daughter   . Hypertension Daughter   . Hyperlipidemia Daughter   . Diabetes Sister   . Hyperlipidemia Sister   . Hypertension Sister   . Early death Brother   . Arthritis Brother   . Heart attack Brother   . Heart disease Brother   . Arthritis Brother     PMHx, SurgHx, SocialHx, FamHx, Medications, and Allergies were reviewed in  the Visit Navigator and updated as appropriate.   Patient Active Problem List   Diagnosis Date Noted  . Diabetes mellitus without complication ([place]) 47/42/5956  . OSA on CPAP   . Chronic kidney disease   . Hypertension   . Mixed hyperlipidemia     Social History   Tobacco Use  . Smoking status: Former Research scientist (life sciences)  . Smokeless tobacco: Never Used  . Tobacco comment: only socially in high school  Substance Use Topics  . Alcohol use: Not Currently  . Drug use: Never    Current Medications and Allergies:    Current Outpatient Medications:  .  amLODipine (NORVASC) 10 MG tablet, Take 1 tablet (10 mg total) by mouth daily., Disp: 90 tablet, Rfl: 1 .  aspirin EC 81 MG tablet, Take 1 tablet (81 mg total) by mouth daily., Disp: 90 tablet, Rfl: 1 .  atorvastatin (LIPITOR) 40 MG tablet, Take 1 tablet (40 mg total) by mouth daily., Disp: 90 tablet, Rfl: 1 .  carvedilol (COREG) 6.25 MG tablet, TAKE 1 TABLET BY MOUTH TWO  TIMES DAILY WITH A MEAL, Disp: 180 tablet, Rfl: 0 .  ferrous sulfate (IRON SUPPLEMENT) 325 (65 FE) MG tablet, Take 1 tablet (325 mg total) by mouth 2 (two) times daily with a meal., Disp: 60 tablet, Rfl: 2 .  glucose blood (ONE TOUCH ULTRA TEST) test strip, Use to test blood sugars 2 times daily., Disp: , Rfl:  .  JANUVIA 50 MG tablet, TAKE 1 TABLET BY MOUTH  DAILY, Disp: 90 tablet, Rfl: 0 .  Lancets (ONETOUCH ULTRASOFT) lancets, CHECK TWICE PER DAY BEFORE  BREAKFAST AND DINNER, Disp: 200 each, Rfl: 1 .  losartan (COZAAR) 25 MG tablet, TK 1 T PO QD, Disp: 90 tablet, Rfl: 1 .  LUMIGAN 0.01 % SOLN, Place 1 drop into both eyes every evening., Disp: , Rfl:  .  traMADol (ULTRAM) 50 MG tablet, Take 1 tablet (50 mg total) by mouth every 6 (six) hours as needed., Disp: 30 tablet, Rfl: 2 .  vitamin B-12 (CYANOCOBALAMIN) 500 MCG tablet, Take 500 mcg by mouth daily., Disp: , Rfl:  .  Dulaglutide (TRULICITY) 3.87 FI/4.3PI SOPN, Inject 0.75 mg into the skin once a week., Disp: 4 pen, Rfl: 1 .   Dulaglutide (TRULICITY) 9.51 OA/4.1YS SOPN, Inject 0.75 mg  into the skin once a week., Disp: 2 pen, Rfl: 0   Allergies  Allergen Reactions  . Metformin And Related Swelling  . Lisinopril Rash    Review of Systems   Review of Systems  Constitutional: Negative for chills, fever, malaise/fatigue and weight loss.  Respiratory: Negative for shortness of breath.   Cardiovascular: Negative for chest pain, orthopnea, claudication and leg swelling.  Gastrointestinal: Negative for heartburn, nausea and vomiting.  Genitourinary: Negative for dysuria.  Musculoskeletal: Positive for joint pain.  Neurological: Negative for dizziness, tingling and headaches.  Psychiatric/Behavioral: Negative for depression. The patient is not nervous/anxious and does not have insomnia.     Vitals:   Vitals:   12/02/18 0819  BP: 138/70  Pulse: 88  Temp: 98.3 F (36.8 C)  TempSrc: Oral  SpO2: 99%  Weight: 167 lb (75.8 kg)  Height: 5\' 6"  (1.676 m)     Body mass index is 26.95 kg/m.   Physical Exam:    Physical Exam Vitals signs and nursing note reviewed.  Constitutional:      General: She is not in acute distress.    Appearance: She is well-developed. She is not ill-appearing or toxic-appearing.  Cardiovascular:     Rate and Rhythm: Normal rate and regular rhythm.     Pulses: Normal pulses.     Heart sounds: Normal heart sounds, S1 normal and S2 normal.     Comments: No LE edema Pulmonary:     Effort: Pulmonary effort is normal.     Breath sounds: Normal breath sounds.  Skin:    General: Skin is warm and dry.  Neurological:     Mental Status: She is alert.     GCS: GCS eye subscore is 4. GCS verbal subscore is 5. GCS motor subscore is 6.  Psychiatric:        Speech: Speech normal.        Behavior: Behavior normal. Behavior is cooperative.      Assessment and Plan:    Kandra was seen today for diabetes.  Diagnoses and all orders for this visit:  Diabetes mellitus without  complication [facility]) Doing well on the Trulicity thus far. Not due for HgbA1c. Will plan to recheck after 02/11/2019. Continue current regimen. Discussed that she should work on adequate calorie and protein intake.  Arthritis She declines change in medication. We briefly discussed trialing gabapentin but she doesn't want another daily medication. Continue to monitor.  Other orders -     Dulaglutide (TRULICITY) 7.62 GB/1.5VV SOPN; Inject 0.75 mg into the skin once a week. -     Dulaglutide (TRULICITY) 6.16 WV/3.7TG SOPN; Inject 0.75 mg into the skin once a week.   Reviewed expectations re: course of current medical issues. . Discussed self-management of symptoms. . Outlined signs and symptoms indicating need for more acute intervention. . Patient verbalized understanding and all questions were answered. . See orders for this visit as documented in the electronic medical record. . Patient received an After Visit Summary.  CMA or LPN served as scribe during this visit. History, Physical, and Plan performed by medical provider. The above documentation has been reviewed and is accurate and complete.   Inda Coke, PA-C [place] 12/02/2018  Follow-up: No follow-ups on file.

## 2018-12-02 NOTE — Patient Instructions (Signed)
It was great to see you!  Continue Trulicity.  Follow up with [place] after February 11, 2019.  Take care,  Inda Coke PA-C

## 2018-12-04 ENCOUNTER — Telehealth: Payer: Self-pay | Admitting: *Deleted

## 2018-12-04 NOTE — Telephone Encounter (Signed)
Spoke to pt, told her [name] would like you to stop your Januvia 50 mg tablet due to on Trulicity. Pt verbalized understanding.

## 2018-12-08 ENCOUNTER — Encounter: Payer: Self-pay | Admitting: Physician Assistant

## 2019-01-27 NOTE — Progress Notes (Signed)
Virtual Visit via Video Note   Subjective:   Catherine Mcconnell, female    DOB: Nov 14, 1945, 73 y.o.   MRN: [id number]   I connected with the patient on 01/28/19 by a video enabled telemedicine application and verified that I am speaking with the correct person using two identifiers.     I discussed the limitations of evaluation and management by telemedicine and the availability of in person appointments. The patient expressed understanding and agreed to proceed.   This visit type was conducted due to national recommendations for restrictions regarding the COVID-19 Pandemic (e.g. social distancing).  This format is felt to be most appropriate for this patient at this time.  All issues noted in this document were discussed and addressed.  No physical exam was performed (except for noted visual exam findings with Tele health visits).  The patient has consented to conduct a Tele health visit and understands insurance will be billed.     Chief complaint:  Shortness of breath  HPI  73 y/o [place] female with hypertension, hyperlipidemia, type 2 DM, CKD 3.  Workup was reassuring in 2019, with normal stress test. Patient has had no new complaints since last visit. She denies chest pain, shortness of breath, palpitations, leg edema, orthopnea, PND, TIA/syncope. Her diabetes remains uncontrolled, but improving after starting Trulicity. She is no longer on Glipizide. She is on Aspirin 81 mg. While she denies any melena, she is on iron supplements for anemia.   Past Medical History:  Diagnosis Date   Arthritis    Chronic kidney disease    Diabetes mellitus without complication (HCC)    Hypertension    Iron deficiency    on oral iron supplements, has not required transfusion   Mixed hyperlipidemia    OSA on CPAP    compliant   Thyroid nodule    removed     Past Surgical History:  Procedure Laterality Date   ABDOMINAL HYSTERECTOMY  1996   MASS EXCISION Right 2017   right arm mass removal   THYROID SURGERY     left side removal benign nodule     Social History   Socioeconomic History   Marital status: Divorced    Spouse name: Not on file   Number of children: 6   Years of education: some college   Highest education level: Not on file  Occupational History   Not on file  Social Needs   Financial resource strain: Not on file   Food insecurity:    Worry: Not on file    Inability: Not on file   Transportation needs:    Medical: Not on file    Non-medical: Not on file  Tobacco Use   Smoking status: Former Smoker   Smokeless tobacco: Never Used   Tobacco comment: only socially in high school  Substance and Sexual Activity   Alcohol use: Not Currently   Drug use: Never   Sexual activity: Not on file  Lifestyle   Physical activity:    Days per week: Not on file    Minutes per session: Not on file   Stress: Not on file  Relationships   Social connections:    Talks on phone: Not on file    Gets together: Not on file    Attends religious service: Not on file    Active member of club or organization: Not on file    Attends meetings of clubs or organizations: Not on file    Relationship status: Not  on file   Intimate partner violence:    Fear of current or ex partner: Not on file    Emotionally abused: Not on file    Physically abused: Not on file    Forced sexual activity: Not on file  Other Topics Concern   Not on file  Social History Narrative   Mother of 37   --Two children have passed, one from from prostate cancer and one from liver failure   Used to be a CNA, Child psychotherapist   Recently moved back to [place]     Family History  Problem Relation Age of Onset   Stroke Mother    Hypertension Mother    Heart disease Mother    Heart attack Father    Early death Father    Hypertension Father    Diabetes Sister    Early death Sister    Hypertension Sister    Stroke Sister    Prostate cancer Son     Cancer Son    Early death Son    Breast cancer Cousin    Diabetes Sister    Early death Sister    Hyperlipidemia Sister    Kidney disease Sister    Depression Daughter    Hypertension Daughter    Hyperlipidemia Daughter    Diabetes Sister    Hyperlipidemia Sister    Hypertension Sister    Early death Brother    Arthritis Brother    Heart attack Brother    Heart disease Brother    Arthritis Brother      Current Outpatient Medications on File Prior to Visit  Medication Sig Dispense Refill   amLODipine (NORVASC) 10 MG tablet Take 1 tablet (10 mg total) by mouth daily. 90 tablet 1   aspirin EC 81 MG tablet Take 1 tablet (81 mg total) by mouth daily. 90 tablet 1   atorvastatin (LIPITOR) 40 MG tablet Take 1 tablet (40 mg total) by mouth daily. 90 tablet 1   carvedilol (COREG) 6.25 MG tablet TAKE 1 TABLET BY MOUTH TWO  TIMES DAILY WITH A MEAL 180 tablet 0   Dulaglutide (TRULICITY) 8.78 MV/6.7MC SOPN Inject 0.75 mg into the skin once a week. 2 pen 0   ferrous sulfate (IRON SUPPLEMENT) 325 (65 FE) MG tablet Take 1 tablet (325 mg total) by mouth 2 (two) times daily with a meal. 60 tablet 2         glucose blood (ONE TOUCH ULTRA TEST) test strip Use to test blood sugars 2 times daily.     Lancets (ONETOUCH ULTRASOFT) lancets CHECK TWICE PER DAY BEFORE  BREAKFAST AND DINNER 200 each 1   losartan (COZAAR) 25 MG tablet TK 1 T PO QD (Patient taking differently: Take 25 mg by mouth daily. TK 1 T PO QD) 90 tablet 1   LUMIGAN 0.01 % SOLN Place 1 drop into both eyes every evening.     traMADol (ULTRAM) 50 MG tablet Take 1 tablet (50 mg total) by mouth every 6 (six) hours as needed. 30 tablet 2   No current facility-administered medications on file prior to visit.     Cardiovascular studies:  Exercise Treadmill Stress Test 03/06/2018:  Indication: Cardiac Evaluation  The patient exercised on Bruce protocol for 5:04 min. Patient achieved  7.05 METS and reached HR   148 bpm, which is   99% of maximum age-predicted HR.  Stress test terminated due to Fatigue.  Exercise capacity was below average for age. HR Response to Exercise: Exaggerated HR response.BP  Response to Exercise: Resting hypertension- exaggerated response. Chest Pain: none. Arrhythmias: none. ST Changes: With peak exercise there waere nonspecific ST-T changes in inferior leads.   Overall Impression: Normal stress test. Continue primary/secondary prevention.  EKG 12/24/2017: Sinus bradycardia 53 bpm. Low voltage precordial leads. Otherwise normal EKG.   Echocardiogram 02/05/2017: EF 60-65%.  Grade 1 diastolic dysfunction.  Normal tricuspid regurgitation.  Recent labs: Results for ROYETTA, PROBUS (MRN [id number]) as of 01/28/2019 07:22  Ref. Range 11/13/2018 08:45 11/13/2018 08:54 11/26/2018 76:73  BASIC METABOLIC PANEL Unknown  Rpt (A)   Sodium Latest Ref Range: 135 - 145 mEq/L  136   Potassium Latest Ref Range: 3.5 - 5.1 mEq/L  3.9   Chloride Latest Ref Range: 96 - 112 mEq/L  101   CO2 Latest Ref Range: 19 - 32 mEq/L  29   Glucose Latest Ref Range: 70 - 99 mg/dL  180 (H)   BUN Latest Ref Range: 6 - 23 mg/dL  18   Creatinine Latest Ref Range: 0.40 - 1.20 mg/dL  1.40 (H)   Calcium Latest Ref Range: 8.4 - 10.5 mg/dL  9.4   GFR Latest Ref Range: >60.00 mL/min  44.68 (L)   Hemoglobin A1C Latest Ref Range: 4.0 - 5.6 % 9.0 (A)     Results for EVANGELYNE, LOJA (MRN [id number]) as of 01/28/2019 07:22  Ref. Range 08/13/2018 09:16  WBC Latest Ref Range: 4.0 - 10.5 K/uL 2.8 (L)  RBC Latest Ref Range: 3.87 - 5.11 Mil/uL 3.94  Hemoglobin Latest Ref Range: 12.0 - 15.0 g/dL 11.7 (L)  HCT Latest Ref Range: 36.0 - 46.0 % 34.2 (L)  MCV Latest Ref Range: 78.0 - 100.0 fl 86.8  MCHC Latest Ref Range: 30.0 - 36.0 g/dL 34.3  RDW Latest Ref Range: 11.5 - 15.5 % 13.1  Platelets Latest Ref Range: 150.0 - 400.0 K/uL 165.0    Review of Systems  Constitution: Negative for decreased appetite,  malaise/fatigue, weight gain and weight loss.  HENT: Negative for congestion.   Eyes: Negative for visual disturbance.  Cardiovascular: Negative for chest pain, dyspnea on exertion, leg swelling, palpitations and syncope.  Respiratory: Negative for shortness of breath.   Endocrine: Negative for cold intolerance.  Hematologic/Lymphatic: Does not bruise/bleed easily.  Skin: Negative for itching and rash.  Musculoskeletal: Negative for myalgias.  Gastrointestinal: Negative for abdominal pain, nausea and vomiting.  Genitourinary: Negative for dysuria.  Neurological: Negative for dizziness and weakness.  Psychiatric/Behavioral: The patient is not nervous/anxious.   All other systems reviewed and are negative.        Vitals:   01/28/19 1011  BP: 131/71  Pulse: (!) 55   (Measured by the patient using a home BP monitor)   Observation/findings during video visit   Objective:    Physical Exam  Constitutional: She is oriented to person, place, and time. She appears well-developed and well-nourished. No distress.  Pulmonary/Chest: Effort normal.  Neurological: She is alert and oriented to person, place, and time.  Psychiatric: She has a normal mood and affect.  Nursing note and vitals reviewed.         Assessment & Recommendations:   72 y/o [place] female with hypertension, hyperlipidemia, type 2 DM, CKD 3.  Hypertension: Well controlled. Continue current therapy  Hyperlipidemia: Improved, but could be better. Getting lipid panel next month with PCP.   Type 2 DM: Uncontrolled. HbA1C 9.1%. Following up with PCP. Encouraged regular physical activity.  Primary prevention: In absence of known CAD, low risk stress  test, and anemia, risks of aspirin outweigh benefits. Stopped aspirin.  I will see her back in 1 year.    Nigel Mormon, MD [facility] [place] Pager: [phone] Office: [phone] If no answer Cell [phone]

## 2019-01-28 ENCOUNTER — Other Ambulatory Visit: Payer: Self-pay

## 2019-01-28 ENCOUNTER — Ambulatory Visit (INDEPENDENT_AMBULATORY_CARE_PROVIDER_SITE_OTHER): Payer: Medicare Other | Admitting: Cardiology

## 2019-01-28 ENCOUNTER — Encounter: Payer: Self-pay | Admitting: Cardiology

## 2019-01-28 VITALS — BP 131/71 | HR 55 | Ht 67.0 in | Wt 157.2 lb

## 2019-01-28 DIAGNOSIS — E119 Type 2 diabetes mellitus without complications: Secondary | ICD-10-CM | POA: Diagnosis not present

## 2019-01-28 DIAGNOSIS — E782 Mixed hyperlipidemia: Secondary | ICD-10-CM

## 2019-01-28 DIAGNOSIS — I10 Essential (primary) hypertension: Secondary | ICD-10-CM | POA: Diagnosis not present

## 2019-01-29 ENCOUNTER — Other Ambulatory Visit: Payer: Self-pay | Admitting: Physician Assistant

## 2019-02-02 ENCOUNTER — Other Ambulatory Visit: Payer: Self-pay | Admitting: Physician Assistant

## 2019-02-09 ENCOUNTER — Telehealth: Payer: Self-pay | Admitting: *Deleted

## 2019-02-09 NOTE — Telephone Encounter (Signed)
Left message on voicemail to call office. Need to change appt 5/1 to Doxy and also schedule Annual Wellness visit one week later.

## 2019-02-12 ENCOUNTER — Ambulatory Visit: Payer: Medicare Other | Admitting: Physician Assistant

## 2019-02-14 NOTE — Progress Notes (Signed)
Virtual Visit via Video   I connected with Catherine Mcconnell on 02/15/19 at  8:00 AM EDT by a video enabled telemedicine application and verified that I am speaking with the correct person using two identifiers. Location patient: Home Location provider: [facility], Office Persons participating in the virtual visit: Tamantha, Saline [place], [place]   I discussed the limitations of evaluation and management by telemedicine and the availability of in person appointments. The patient expressed understanding and agreed to proceed.  Subjective:   HPI:   Diabetes Patient presents for follow-up on her DM. Last HgbA1c on 11/13/18 was 9% (up from 8.6%, 3 months prior). She was changed from Glipizide 10 mg daily and Januvia 50 mg daily to Trulicity 2.68 mg weekly. She is tolerating this regimen well. Current fasting blood sugars are ranging from 129 - 165. Her weight is overall stable, currently at 157 lb.  Wt Readings from Last 3 Encounters:  01/28/19 157 lb 3.2 oz (71.3 kg)  12/02/18 167 lb (75.8 kg)  11/13/18 165 lb 8 oz (75.1 kg)   CKD We are also following up on her CKD today. She is due for recheck of her renal labs. She avoids nephrotoxic agents, such as ibuprofen.  Hip pain and R wrist pain She continues to have ongoing issues with pain. She takes about two tramadol a week and intermittently will take tylenol arthritis in between. She feels like she may be dealing with some R carpal tunnel. She has a R wrist brace that she is tolerating well.  ROS: See pertinent positives and negatives per HPI.  Patient Active Problem List   Diagnosis Date Noted  . Diabetes mellitus without complication ([place]) 34/19/6222  . OSA on CPAP   . Chronic kidney disease   . Hypertension   . Mixed hyperlipidemia     Social History   Tobacco Use  . Smoking status: Former Research scientist (life sciences)  . Smokeless tobacco: Never Used  . Tobacco comment: only socially in high school  Substance Use Topics  . Alcohol  use: Not Currently    Current Outpatient Medications:  .  amLODipine (NORVASC) 10 MG tablet, Take 1 tablet (10 mg total) by mouth daily., Disp: 90 tablet, Rfl: 1 .  atorvastatin (LIPITOR) 40 MG tablet, TAKE 1 TABLET BY MOUTH  DAILY, Disp: 90 tablet, Rfl: 0 .  carvedilol (COREG) 6.25 MG tablet, TAKE 1 TABLET BY MOUTH TWO  TIMES DAILY WITH A MEAL, Disp: 180 tablet, Rfl: 0 .  ferrous sulfate (IRON SUPPLEMENT) 325 (65 FE) MG tablet, Take 1 tablet (325 mg total) by mouth 2 (two) times daily with a meal., Disp: 60 tablet, Rfl: 2 .  gabapentin (NEURONTIN) 100 MG capsule, Take 1 capsule by mouth daily for three days, then increase to 2 capsules daily for three days, then increase to goal of 3 capsules daily., Disp: 30 capsule, Rfl: 3 .  glucose blood (ONE TOUCH ULTRA TEST) test strip, Use to test blood sugars 2 times daily., Disp: , Rfl:  .  Lancets (ONETOUCH ULTRASOFT) lancets, CHECK TWICE PER DAY BEFORE  BREAKFAST AND DINNER, Disp: 200 each, Rfl: 1 .  losartan (COZAAR) 25 MG tablet, TK 1 T PO QD (Patient taking differently: Take 25 mg by mouth daily. TK 1 T PO QD), Disp: 90 tablet, Rfl: 1 .  LUMIGAN 0.01 % SOLN, Place 1 drop into both eyes every evening., Disp: , Rfl:  .  traMADol (ULTRAM) 50 MG tablet, Take 1 tablet (50 mg total) by  mouth every 6 (six) hours as needed., Disp: 30 tablet, Rfl: 2 .  TRULICITY 4.81 EH/6.3JS SOPN, INJECT THE CONTENTS OF 1  PEN SUBCUTANEOUSLY WEEKLY  AS DIRECTED, Disp: 6 mL, Rfl: 1  Allergies  Allergen Reactions  . Lisinopril Rash  . Metformin And Related Swelling    Objective:   VITALS: Per patient if applicable, see vitals. GENERAL: Alert, appears well and in no acute distress. HEENT: Atraumatic, conjunctiva clear, no obvious abnormalities on inspection of external nose and ears. NECK: Normal movements of the head and neck. CARDIOPULMONARY: No increased WOB. Speaking in clear sentences. I:E ratio WNL.  MS: Moves all visible extremities without noticeable  abnormality. PSYCH: Pleasant and cooperative, well-groomed. Speech normal rate and rhythm. Affect is appropriate. Insight and judgement are appropriate. Attention is focused, linear, and appropriate.  NEURO: CN grossly intact. Oriented as arrived to appointment on time with no prompting. Moves both UE equally.  SKIN: No obvious lesions, wounds, erythema, or cyanosis noted on face or hands.  Assessment and Plan:   Diagnoses and all orders for this visit:  Diabetes mellitus without complication ([place]) Due for recheck of HgbA1c today. She is going to come by the office this afternoon. Will adjust regimen based on results. -     Basic metabolic panel; Future -     Hemoglobin A1c; Future  Stage 3 chronic kidney disease ([place]) Recheck today.  Pain of both hip joints We are going to trial gabapentin. Discussed ramping up from 100 mg daily to eventual dose of 300 mg daily. I recommended that she let us know if this is not sufficient for her, we may need to send to Charlann Boxer for further eval and treatment.  Other orders -     gabapentin (NEURONTIN) 100 MG capsule; Take 1 capsule by mouth daily for three days, then increase to 2 capsules daily for three days, then increase to goal of 3 capsules daily.    . Reviewed expectations re: course of current medical issues. . Discussed self-management of symptoms. . Outlined signs and symptoms indicating need for more acute intervention. . Patient verbalized understanding and all questions were answered. Marland Kitchen Health Maintenance issues including appropriate healthy diet, exercise, and smoking avoidance were discussed with patient. . See orders for this visit as documented in the electronic medical record.  I discussed the assessment and treatment plan with the patient. The patient was provided an opportunity to ask questions and all were answered. The patient agreed with the plan and demonstrated an understanding of the instructions.   The patient was  advised to call back or seek an in-person evaluation if the symptoms worsen or if the condition fails to improve as anticipated.    [place], [place] 02/15/2019

## 2019-02-15 ENCOUNTER — Ambulatory Visit (INDEPENDENT_AMBULATORY_CARE_PROVIDER_SITE_OTHER): Payer: Medicare Other | Admitting: Physician Assistant

## 2019-02-15 ENCOUNTER — Encounter: Payer: Self-pay | Admitting: Physician Assistant

## 2019-02-15 ENCOUNTER — Other Ambulatory Visit: Payer: Self-pay | Admitting: Physician Assistant

## 2019-02-15 DIAGNOSIS — M25552 Pain in left hip: Secondary | ICD-10-CM

## 2019-02-15 DIAGNOSIS — M25551 Pain in right hip: Secondary | ICD-10-CM

## 2019-02-15 DIAGNOSIS — E119 Type 2 diabetes mellitus without complications: Secondary | ICD-10-CM | POA: Diagnosis not present

## 2019-02-15 DIAGNOSIS — N183 Chronic kidney disease, stage 3 unspecified: Secondary | ICD-10-CM

## 2019-02-15 LAB — BASIC METABOLIC PANEL
BUN: 21 mg/dL (ref 6–23)
CO2: 28 mEq/L (ref 19–32)
Calcium: 9.3 mg/dL (ref 8.4–10.5)
Chloride: 101 mEq/L (ref 96–112)
Creatinine, Ser: 1.38 mg/dL — ABNORMAL HIGH (ref 0.40–1.20)
GFR: 45.4 mL/min — ABNORMAL LOW (ref >60.00)
Glucose, Bld: 204 mg/dL — ABNORMAL HIGH (ref 70–99)
Potassium: 4 mEq/L (ref 3.5–5.1)
Sodium: 136 mEq/L (ref 135–145)

## 2019-02-15 LAB — HEMOGLOBIN A1C: Hgb A1c MFr Bld: 9.4 % — ABNORMAL HIGH (ref 4.6–6.5)

## 2019-02-15 MED ORDER — DULAGLUTIDE 1.5 MG/0.5ML ~~LOC~~ SOAJ: 1.5000 mg | SUBCUTANEOUS | 2 refills | Status: DC

## 2019-02-15 MED ORDER — GABAPENTIN 100 MG PO CAPS: ORAL_CAPSULE | ORAL | 3 refills | Status: DC

## 2019-02-15 NOTE — Addendum Note (Signed)
Addended by: Francis Dowse T on: 02/15/2019 02:23 PM   Modules accepted: Orders

## 2019-02-16 ENCOUNTER — Other Ambulatory Visit: Payer: Self-pay | Admitting: Nephrology

## 2019-02-16 DIAGNOSIS — N183 Chronic kidney disease, stage 3 unspecified: Secondary | ICD-10-CM

## 2019-03-04 ENCOUNTER — Ambulatory Visit
Admission: RE | Admit: 2019-03-04 | Discharge: 2019-03-04 | Disposition: A | Discharge status: Home / Self Care | Payer: Medicare Other | Source: Ambulatory Visit | Attending: Nephrology | Admitting: Nephrology

## 2019-03-04 DIAGNOSIS — N183 Chronic kidney disease, stage 3 unspecified: Secondary | ICD-10-CM

## 2019-03-12 ENCOUNTER — Other Ambulatory Visit: Payer: Self-pay | Admitting: Physician Assistant

## 2019-03-12 DIAGNOSIS — I10 Essential (primary) hypertension: Secondary | ICD-10-CM

## 2019-03-23 ENCOUNTER — Encounter: Payer: Self-pay | Admitting: Adult Health

## 2019-03-24 ENCOUNTER — Telehealth: Payer: Self-pay | Admitting: *Deleted

## 2019-03-24 NOTE — Telephone Encounter (Signed)
Due to current COVID 19 pandemic, our office is severely reducing in office visits until further notice, in order to minimize the risk to our patients and healthcare providers.  Pt understands that although there may be some limitations with this type of visit, we will take all precautions to reduce any security or privacy concerns.  Pt understands that this will be treated like an in office visit and we will file with pt's insurance, and there may be a patient responsible charge related to this service.  She consented to [email or web address] visit.  email sent to [email or web address].

## 2019-03-29 ENCOUNTER — Ambulatory Visit (INDEPENDENT_AMBULATORY_CARE_PROVIDER_SITE_OTHER): Payer: Medicare Other | Admitting: Adult Health

## 2019-03-29 ENCOUNTER — Other Ambulatory Visit: Payer: Self-pay | Admitting: Physician Assistant

## 2019-03-29 ENCOUNTER — Other Ambulatory Visit: Payer: Self-pay

## 2019-03-29 DIAGNOSIS — G4733 Obstructive sleep apnea (adult) (pediatric): Secondary | ICD-10-CM

## 2019-03-29 DIAGNOSIS — Z9989 Dependence on other enabling machines and devices: Secondary | ICD-10-CM

## 2019-03-29 NOTE — Progress Notes (Signed)
  [facility] [address] [place]. [place] [address] [phone]     Virtual Visit via Telephone Note  I connected with Catherine Mcconnell on 03/29/19 at 11:00 AM EDT by telephone located remotely at [facility] and verified that I am speaking with the correct person using two identifiers who reports being located at home.     Visit scheduled by RN. She discussed the limitations, risks, security and privacy concerns of performing an evaluation and management service by telephone and the availability of in person appointments. I also discussed with the patient that there may be a patient responsible charge related to this service. The patient expressed understanding and agreed to proceed. See telephone note for consent and additional scheduling information.    History of Present Illness:  Catherine Mcconnell is a 73 y.o. female who has been followed in this office for OSA on CPAP. She was initially scheduled for face-to-face office follow up visit today time but due to [place], visit rescheduled for non-face-to-face telephone visit with patients consent. Unable to participate in video visit due to lack of access to device with camera.    Today 03/29/19 Catherine Mcconnell is a 74 year old female with a history of obstructive sleep apnea.  Her download shows that she use her machine 30 out of 30 days for compliance of 100%.  She use her machine greater than 4 hours each night.  On average she uses her machine 8 hours and 22 minutes.  Her residual AHI is 1.8 on 11 cm of water with EPR of 3.  She has a leak in the 95th percentile at 35.2 L/min.  She states that she sometimes feels the mask leaking at night.  She states that she does have an extra small mask and she may try that as it may get better.    Observations/Objective:   Neurological examination  Mentation: Alert oriented to time, place, history taking. speech and language fluent  Assessment and Plan:  1.   Obstructive sleep apnea on CPAP  The patient CPAP download shows excellent compliance and good treatment of her apnea.  She is encouraged to continue using the CPAP nightly and greater than 4 hours each night.  She will try the extra small mask.  If her mask continues to leak we will send her for mask refitting.  She will follow-up in 1 year or sooner if needed.  Follow Up Instructions:   FU in 1 year    I discussed the assessment and treatment plan with the patient.  The patient was provided an opportunity to ask questions and all were answered to their satisfaction. The patient agreed with the plan and verbalized an understanding of the instructions.   I provided 15 minutes of non-face-to-face time during this encounter.    Ward Givens NP-C  [facility] [address] [place], [place] [address]  Phone [phone] Fax [phone]

## 2019-04-25 ENCOUNTER — Other Ambulatory Visit: Payer: Self-pay | Admitting: Physician Assistant

## 2019-04-26 ENCOUNTER — Other Ambulatory Visit: Payer: Self-pay | Admitting: Physician Assistant

## 2019-05-17 ENCOUNTER — Other Ambulatory Visit: Payer: Self-pay | Admitting: Nurse Practitioner

## 2019-06-10 ENCOUNTER — Other Ambulatory Visit: Payer: Self-pay | Admitting: Physician Assistant

## 2019-06-14 ENCOUNTER — Other Ambulatory Visit: Payer: Self-pay | Admitting: Physician Assistant

## 2019-07-02 ENCOUNTER — Other Ambulatory Visit: Payer: Self-pay

## 2019-07-02 ENCOUNTER — Ambulatory Visit (INDEPENDENT_AMBULATORY_CARE_PROVIDER_SITE_OTHER): Payer: Medicare Other

## 2019-07-02 ENCOUNTER — Encounter: Payer: Self-pay | Admitting: Physician Assistant

## 2019-07-02 DIAGNOSIS — Z23 Encounter for immunization: Secondary | ICD-10-CM

## 2019-07-07 ENCOUNTER — Telehealth: Payer: Self-pay | Admitting: Physician Assistant

## 2019-07-07 DIAGNOSIS — E119 Type 2 diabetes mellitus without complications: Secondary | ICD-10-CM

## 2019-07-07 NOTE — Telephone Encounter (Signed)
Patient requesting traMADol (ULTRAM) 50 MG tablet, patient states she's completely out, informed patient please allow 48 to 72 hour turn around time .  Patient requesting a referral to ophthalmology for Diabetic retinopathy, please advise

## 2019-07-07 NOTE — Telephone Encounter (Signed)
See note

## 2019-07-08 ENCOUNTER — Other Ambulatory Visit: Payer: Self-pay | Admitting: Physician Assistant

## 2019-07-08 NOTE — Telephone Encounter (Signed)
Given [name], I need to do a visit with her to refill prescription pain medications. Can be virtual.  Catherine Mcconnell

## 2019-07-08 NOTE — Telephone Encounter (Signed)
Catherine Mcconnell, pt requesting Tramadol refill. Please send and then send back to me so I can take care of referral. Thanks

## 2019-07-08 NOTE — Telephone Encounter (Signed)
Left message on voicemail to call office. Need to ask pt about referral.

## 2019-07-09 NOTE — Telephone Encounter (Signed)
Spoke to pt told her needs to have a virtual visit with [name] before she can refill your Tramadol. Pt verbalized understanding. Appt scheduled for Monday at 8:40 AM virtual visit. Pt verbalized understanding. Asked pt about referral to eye doctor, looks like you already see someone? Pt said yes but not happy with provider wants to change. Told pt I will send referral for Ophthalmologist and someone will contact you to schedule appt. Pt verbalized understanding. Referral placed.

## 2019-07-12 ENCOUNTER — Other Ambulatory Visit (INDEPENDENT_AMBULATORY_CARE_PROVIDER_SITE_OTHER): Payer: Medicare Other

## 2019-07-12 ENCOUNTER — Encounter: Payer: Self-pay | Admitting: Physician Assistant

## 2019-07-12 ENCOUNTER — Ambulatory Visit (INDEPENDENT_AMBULATORY_CARE_PROVIDER_SITE_OTHER): Payer: Medicare Other | Admitting: Physician Assistant

## 2019-07-12 ENCOUNTER — Other Ambulatory Visit: Payer: Self-pay

## 2019-07-12 VITALS — BP 124/67 | HR 55

## 2019-07-12 DIAGNOSIS — N183 Chronic kidney disease, stage 3 unspecified: Secondary | ICD-10-CM

## 2019-07-12 DIAGNOSIS — M1990 Unspecified osteoarthritis, unspecified site: Secondary | ICD-10-CM

## 2019-07-12 DIAGNOSIS — E119 Type 2 diabetes mellitus without complications: Secondary | ICD-10-CM

## 2019-07-12 LAB — BASIC METABOLIC PANEL
BUN: 26 mg/dL — ABNORMAL HIGH (ref 6–23)
CO2: 27 mEq/L (ref 19–32)
Calcium: 9.7 mg/dL (ref 8.4–10.5)
Chloride: 99 mEq/L (ref 96–112)
Creatinine, Ser: 1.6 mg/dL — ABNORMAL HIGH (ref 0.40–1.20)
GFR: 38.23 mL/min — ABNORMAL LOW (ref >60.00)
Glucose, Bld: 211 mg/dL — ABNORMAL HIGH (ref 70–99)
Potassium: 4 mEq/L (ref 3.5–5.1)
Sodium: 135 mEq/L (ref 135–145)

## 2019-07-12 LAB — HEMOGLOBIN A1C: Hgb A1c MFr Bld: 10 % — ABNORMAL HIGH (ref 4.6–6.5)

## 2019-07-12 MED ORDER — TRAMADOL HCL 50 MG PO TABS: 50.0000 mg | ORAL_TABLET | Freq: Two times a day (BID) | ORAL | 1 refills | Status: DC | PRN

## 2019-07-12 NOTE — Progress Notes (Signed)
I have reviewed this visit and I agree on the patient's plan of dosage and recommendations. Elya Tarquinio, DO   

## 2019-07-12 NOTE — Progress Notes (Signed)
Virtual Visit via Video   I connected with Catherine Mcconnell on 07/12/19 at  8:40 AM EDT by a video enabled telemedicine application and verified that I am speaking with the correct person using two identifiers. Location patient: Home Location provider: [facility], Office Persons participating in the virtual visit: Adolph Platania, Inda Coke PA-C, Anselmo Pickler, LPN   I discussed the limitations of evaluation and management by telemedicine and the availability of in person appointments. The patient expressed understanding and agreed to proceed.  I acted as a Education administrator for [employer], PA-C [employer], LPN  Subjective:   HPI:   Arthritis Ongoing issue for her. Last refill of Tramadol was in early spring (April), was given 30 tablets of Tramadol 50 mg. She is requesting refill today. Pt says Tramadol is helping with her pain, but the Gabapentin is not helping at all.  She denies any new changes to her pain.  Diabetes Last A1c in May was 9.4%, which is an ongoing increase for her.  She was increased to max dose of Trulicity at last visit.  She states that she takes this regularly.  She does state that she is noncompliant with her diet.  She may have had some slight weight gain since May.  She is also overdue for recheck of kidney function, so we will also get that checked today. Lab Results  Component Value Date   HGBA1C 9.4 (H) 02/15/2019   ROS: See pertinent positives and negatives per HPI.  Patient Active Problem List   Diagnosis Date Noted  . Diabetes mellitus without complication ([place]) 123XX123  . OSA on CPAP   . Chronic kidney disease   . Hypertension   . Mixed hyperlipidemia     Social History   Tobacco Use  . Smoking status: Former Research scientist (life sciences)  . Smokeless tobacco: Never Used  . Tobacco comment: only socially in high school  Substance Use Topics  . Alcohol use: Not Currently    Current Outpatient Medications:  .  amLODipine (NORVASC) 10 MG tablet,  TAKE 1 TABLET BY MOUTH  DAILY, Disp: 90 tablet, Rfl: 1 .  atorvastatin (LIPITOR) 40 MG tablet, TAKE 1 TABLET BY MOUTH  DAILY, Disp: 90 tablet, Rfl: 3 .  carvedilol (COREG) 6.25 MG tablet, TAKE 1 TABLET BY MOUTH  TWICE DAILY WITH MEALS, Disp: 180 tablet, Rfl: 3 .  ferrous sulfate (IRON SUPPLEMENT) 325 (65 FE) MG tablet, Take 1 tablet (325 mg total) by mouth 2 (two) times daily with a meal., Disp: 60 tablet, Rfl: 2 .  gabapentin (NEURONTIN) 100 MG capsule, TAKE 3 CAPSULES BY MOUTH DAILY., Disp: 360 capsule, Rfl: 1 .  glucose blood (ONE TOUCH ULTRA TEST) test strip, Use to test blood sugars 2 times daily., Disp: , Rfl:  .  Lancets (ONETOUCH ULTRASOFT) lancets, CHECK TWICE PER DAY BEFORE  BREAKFAST AND DINNER, Disp: 200 each, Rfl: 1 .  losartan (COZAAR) 25 MG tablet, TAKE 1 TABLET BY MOUTH  EVERY DAY, Disp: 90 tablet, Rfl: 1 .  LUMIGAN 0.01 % SOLN, Place 1 drop into both eyes every evening., Disp: , Rfl:  .  traMADol (ULTRAM) 50 MG tablet, Take 1 tablet (50 mg total) by mouth every 6 (six) hours as needed., Disp: 30 tablet, Rfl: 2 .  TRULICITY 1.5 0000000 SOPN, INJECT THE CONTENTS OF 1  PEN SUBCUTANEOUSLY WEEKLY  AS DIRECTED, Disp: 4 pen, Rfl: 0  Allergies  Allergen Reactions  . Lisinopril Rash  . Metformin And Related Swelling  Objective:   VITALS: Per patient if applicable, see vitals. GENERAL: Alert, appears well and in no acute distress. HEENT: Atraumatic, conjunctiva clear, no obvious abnormalities on inspection of external nose and ears. NECK: Normal movements of the head and neck. CARDIOPULMONARY: No increased WOB. Speaking in clear sentences. I:E ratio WNL.  MS: Moves all visible extremities without noticeable abnormality. PSYCH: Pleasant and cooperative, well-groomed. Speech normal rate and rhythm. Affect is appropriate. Insight and judgement are appropriate. Attention is focused, linear, and appropriate.  NEURO: CN grossly intact. Oriented as arrived to appointment on time with no  prompting. Moves both UE equally.  SKIN: No obvious lesions, wounds, erythema, or cyanosis noted on face or hands.  Assessment and Plan:   [name] was seen today for medication refill.  Diagnoses and all orders for this visit:  Arthritis Reviewed PDMP, she is compliant with 1 prescriber.  Will refill her tramadol today, with 1 refill.  I do think it is appropriate for her to continue this medication.  Follow-up in 3 months.  Stage 3 chronic kidney disease ([place]) Update renal function labs today.  She is coming in later this afternoon for labs. -     Basic metabolic panel; Future  Diabetes mellitus without complication ([place]) Noncompliant with diet.  Will reassess A1c with lab draw later today.  Will make recommendations based on results. -     Hemoglobin A1c; Future  . Reviewed expectations re: course of current medical issues. . Discussed self-management of symptoms. . Outlined signs and symptoms indicating need for more acute intervention. . Patient verbalized understanding and all questions were answered. Marland Kitchen Health Maintenance issues including appropriate healthy diet, exercise, and smoking avoidance were discussed with patient. . See orders for this visit as documented in the electronic medical record.  I discussed the assessment and treatment plan with the patient. The patient was provided an opportunity to ask questions and all were answered. The patient agreed with the plan and demonstrated an understanding of the instructions.   The patient was advised to call back or seek an in-person evaluation if the symptoms worsen or if the condition fails to improve as anticipated.   CMA or LPN served as scribe during this visit. History, Physical, and Plan performed by medical provider. The above documentation has been reviewed and is accurate and complete.   [place], [place] 07/12/2019

## 2019-07-14 ENCOUNTER — Other Ambulatory Visit: Payer: Self-pay

## 2019-07-14 ENCOUNTER — Ambulatory Visit (INDEPENDENT_AMBULATORY_CARE_PROVIDER_SITE_OTHER): Payer: Medicare Other | Admitting: Physician Assistant

## 2019-07-14 ENCOUNTER — Encounter: Payer: Self-pay | Admitting: Physician Assistant

## 2019-07-14 VITALS — BP 150/80 | HR 62 | Temp 97.3°F | Ht 67.0 in | Wt 161.2 lb

## 2019-07-14 DIAGNOSIS — E119 Type 2 diabetes mellitus without complications: Secondary | ICD-10-CM

## 2019-07-14 DIAGNOSIS — N183 Chronic kidney disease, stage 3 unspecified: Secondary | ICD-10-CM

## 2019-07-14 MED ORDER — GLIPIZIDE 5 MG PO TABS: 2.5000 mg | ORAL_TABLET | Freq: Every day | ORAL | 0 refills | Status: DC

## 2019-07-14 NOTE — Patient Instructions (Addendum)
It was great to see you!  You will be contacted about your referral to endocrinology.  Please get back on track with eating.  Continue current medications.  Let's follow-up anytime after January 1st, sooner if you have concerns.  Take care,  Inda Coke PA-C

## 2019-07-14 NOTE — Progress Notes (Signed)
Catherine Mcconnell is a 73 y.o. female is here to discuss: Lab results  I acted as a Education administrator for [employer], PA-C Anselmo Pickler, LPN  History of Present Illness:   Chief Complaint  Patient presents with  . Discuss lab results    HPI  Lab results Pt is here to discuss abnormal lab results.  HgbA1c has increased to 10%. GFR has decreased to 38. She is currently not on any medications that need to be adjusted for kidney disease.  Her daughter, Catherine Mcconnell, is here today with her. She reports that the patient is not eating much throughout the day. She has had trouble with maintaining a schedule with sleeping and eating. She does not exercise. She is about to move to a new house with her daughter.  She denies any hypoglycemic symptoms, vision changes.   Health Maintenance Due  Topic Date Due  . Hepatitis C Screening  17-Dec-1945  . FOOT EXAM  07/15/1956  . COLONOSCOPY  07/15/1996    Past Medical History:  Diagnosis Date  . Arthritis   . Chronic kidney disease   . Diabetes mellitus without complication ([place])   . Hypertension   . Iron deficiency    on oral iron supplements, has not required transfusion  . Mixed hyperlipidemia   . OSA on CPAP    compliant  . Thyroid nodule    removed     Social History   Socioeconomic History  . Marital status: Divorced    Spouse name: Not on file  . Number of children: 6  . Years of education: some college  . Highest education level: Not on file  Occupational History  . Not on file  Social Needs  . Financial resource strain: Not on file  . Food insecurity    Worry: Not on file    Inability: Not on file  . Transportation needs    Medical: Not on file    Non-medical: Not on file  Tobacco Use  . Smoking status: Former Research scientist (life sciences)  . Smokeless tobacco: Never Used  . Tobacco comment: only socially in high school  Substance and Sexual Activity  . Alcohol use: Not Currently  . Drug use: Never  . Sexual activity: Not on file   Lifestyle  . Physical activity    Days per week: Not on file    Minutes per session: Not on file  . Stress: Not on file  Relationships  . Social Herbalist on phone: Not on file    Gets together: Not on file    Attends religious service: Not on file    Active member of club or organization: Not on file    Attends meetings of clubs or organizations: Not on file    Relationship status: Not on file  . Intimate partner violence    Fear of current or ex partner: Not on file    Emotionally abused: Not on file    Physically abused: Not on file    Forced sexual activity: Not on file  Other Topics Concern  . Not on file  Social History Narrative   Mother of 66   --Two children have passed, one from from prostate cancer and one from liver failure   Used to be a CNA, [employer]   Recently moved back to [place]    Past Surgical History:  Procedure Laterality Date  . ABDOMINAL HYSTERECTOMY  1996  . MASS EXCISION Right 2017   right arm mass removal  .  THYROID SURGERY     left side removal benign nodule    Family History  Problem Relation Age of Onset  . Stroke Mother   . Hypertension Mother   . Heart disease Mother   . Heart attack Father   . Early death Father   . Hypertension Father   . Diabetes Sister   . Early death Sister   . Hypertension Sister   . Stroke Sister   . Prostate cancer Son   . Cancer Son   . Early death Son   . Breast cancer Cousin   . Diabetes Sister   . Early death Sister   . Hyperlipidemia Sister   . Kidney disease Sister   . Depression Daughter   . Hypertension Daughter   . Hyperlipidemia Daughter   . Diabetes Sister   . Hyperlipidemia Sister   . Hypertension Sister   . Early death Brother   . Arthritis Brother   . Heart attack Brother   . Heart disease Brother   . Arthritis Brother     PMHx, SurgHx, SocialHx, FamHx, Medications, and Allergies were reviewed in the Visit Navigator and updated as appropriate.   Patient Active Problem  List   Diagnosis Date Noted  . Diabetes mellitus without complication ([place]) 123XX123  . OSA on CPAP   . Chronic kidney disease   . Hypertension   . Mixed hyperlipidemia     Social History   Tobacco Use  . Smoking status: Former Research scientist (life sciences)  . Smokeless tobacco: Never Used  . Tobacco comment: only socially in high school  Substance Use Topics  . Alcohol use: Not Currently  . Drug use: Never    Current Medications and Allergies:    Current Outpatient Medications:  .  amLODipine (NORVASC) 10 MG tablet, TAKE 1 TABLET BY MOUTH  DAILY, Disp: 90 tablet, Rfl: 1 .  atorvastatin (LIPITOR) 40 MG tablet, TAKE 1 TABLET BY MOUTH  DAILY, Disp: 90 tablet, Rfl: 3 .  carvedilol (COREG) 6.25 MG tablet, TAKE 1 TABLET BY MOUTH  TWICE DAILY WITH MEALS, Disp: 180 tablet, Rfl: 3 .  ferrous sulfate (IRON SUPPLEMENT) 325 (65 FE) MG tablet, Take 1 tablet (325 mg total) by mouth 2 (two) times daily with a meal., Disp: 60 tablet, Rfl: 2 .  gabapentin (NEURONTIN) 100 MG capsule, TAKE 3 CAPSULES BY MOUTH DAILY., Disp: 360 capsule, Rfl: 1 .  glucose blood (ONE TOUCH ULTRA TEST) test strip, Use to test blood sugars 2 times daily., Disp: , Rfl:  .  Lancets (ONETOUCH ULTRASOFT) lancets, CHECK TWICE PER DAY BEFORE  BREAKFAST AND DINNER, Disp: 200 each, Rfl: 1 .  losartan (COZAAR) 25 MG tablet, TAKE 1 TABLET BY MOUTH  EVERY DAY, Disp: 90 tablet, Rfl: 1 .  LUMIGAN 0.01 % SOLN, Place 1 drop into both eyes every evening., Disp: , Rfl:  .  traMADol (ULTRAM) 50 MG tablet, Take 1 tablet (50 mg total) by mouth every 12 (twelve) hours as needed., Disp: 30 tablet, Rfl: 1 .  TRULICITY 1.5 0000000 SOPN, INJECT THE CONTENTS OF 1  PEN SUBCUTANEOUSLY WEEKLY  AS DIRECTED, Disp: 4 pen, Rfl: 0   Allergies  Allergen Reactions  . Lisinopril Rash  . Metformin And Related Swelling    Review of Systems   ROS  Negative unless otherwise specified per HPI.   Vitals:   Vitals:   07/14/19 1352  BP: (!) 150/80  Pulse: 62  Temp:  (!) 97.3 F (36.3 C)  TempSrc: Temporal  SpO2: 97%  Weight: 161 lb 4 oz (73.1 kg)  Height: 5\' 7"  (1.702 m)     Body mass index is 25.26 kg/m.   Physical Exam:    Physical Exam Constitutional:      Appearance: She is well-developed.  HENT:     Head: Normocephalic and atraumatic.  Eyes:     Conjunctiva/sclera: Conjunctivae normal.  Neck:     Musculoskeletal: Normal range of motion and neck supple.  Pulmonary:     Effort: Pulmonary effort is normal.  Musculoskeletal: Normal range of motion.  Skin:    General: Skin is warm and dry.  Neurological:     Mental Status: She is alert and oriented to person, place, and time.  Psychiatric:        Behavior: Behavior normal.        Thought Content: Thought content normal.        Judgment: Judgment normal.      Assessment and Plan:    Catherine Mcconnell was seen today for discuss lab results.  Diagnoses and all orders for this visit:  Diabetes mellitus without complication ([place]); Stage 3 chronic kidney disease Long discussion with patient and daughter regarding need for better control. Recommend more regular diet and consistent exercise. Referral to endocrinology for further evaluation and treatment.   . Reviewed expectations re: course of current medical issues. . Discussed self-management of symptoms. . Outlined signs and symptoms indicating need for more acute intervention. . Patient verbalized understanding and all questions were answered. . See orders for this visit as documented in the electronic medical record. . Patient received an After Visit Summary.  CMA or LPN served as scribe during this visit. History, Physical, and Plan performed by medical provider. The above documentation has been reviewed and is accurate and complete.  I spent 25 minutes with this patient, greater than 50% was face-to-face time counseling regarding the above diagnoses.  Inda Coke, PA-C [place] 07/14/2019  Follow-up: No  follow-ups on file.

## 2019-08-04 ENCOUNTER — Other Ambulatory Visit: Payer: Self-pay | Admitting: Physician Assistant

## 2019-08-04 DIAGNOSIS — I10 Essential (primary) hypertension: Secondary | ICD-10-CM

## 2019-08-05 LAB — BASIC METABOLIC PANEL
BUN: 19 (ref 4–21)
Creatinine: 1.4 — AB (ref 0.5–1.1)
Glucose: 226
Potassium: 3.8 (ref 3.4–5.3)
Sodium: 136 — AB (ref 137–147)

## 2019-08-05 LAB — CBC AND DIFFERENTIAL
HCT: 34 — AB (ref 36–46)
Hemoglobin: 11.5 — AB (ref 12.0–16.0)
Platelets: 200 (ref 150–399)
WBC: 2.8

## 2019-08-05 LAB — COMPREHENSIVE METABOLIC PANEL
Albumin: 4.6 (ref 3.5–5.0)
Calcium: 9.6 (ref 8.7–10.7)
GFR calc Af Amer: 44
GFR calc non Af Amer: 38

## 2019-08-05 LAB — CBC: RBC: 3.85 — AB (ref 3.87–5.11)

## 2019-08-09 ENCOUNTER — Other Ambulatory Visit: Payer: Self-pay

## 2019-08-09 ENCOUNTER — Ambulatory Visit (INDEPENDENT_AMBULATORY_CARE_PROVIDER_SITE_OTHER): Payer: Medicare Other

## 2019-08-09 DIAGNOSIS — Z0000 Encounter for general adult medical examination without abnormal findings: Secondary | ICD-10-CM | POA: Diagnosis not present

## 2019-08-09 DIAGNOSIS — Z1231 Encounter for screening mammogram for malignant neoplasm of breast: Secondary | ICD-10-CM | POA: Diagnosis not present

## 2019-08-09 NOTE — Progress Notes (Addendum)
I connected with Catherine Mcconnell on 08/09/19 at 47 by phone and verified that I am speaking with the correct person using two identifiers. Location patient: Home Location provider:  [facility], Office Persons participating in the virtual visit: Denman George LPN and Dr. Orma Flaming    I discussed the limitations of evaluation and management by telemedicine and the availability of in person appointments. The patient expressed understanding and agreed to proceed.  Subjective:   Catherine Mcconnell is a 73 y.o. female who presents for an Initial Medicare Annual Wellness Visit.  Review of Systems     Cardiac Risk Factors include: advanced age (>52men, >22 women);diabetes mellitus;dyslipidemia;hypertension    Objective:    There were no vitals filed for this visit. There is no height or weight on file to calculate BMI.  Advanced Directives 08/09/2019  Does Patient Have a Medical Advance Directive? No  Would patient like information on creating a medical advance directive? No - Patient declined    Current Medications (verified) Outpatient Encounter Medications as of 08/09/2019  Medication Sig  . amLODipine (NORVASC) 10 MG tablet TAKE 1 TABLET BY MOUTH  DAILY  . aspirin EC 81 MG tablet Take 81 mg by mouth daily.  Marland Kitchen atorvastatin (LIPITOR) 40 MG tablet TAKE 1 TABLET BY MOUTH  DAILY  . carvedilol (COREG) 6.25 MG tablet TAKE 1 TABLET BY MOUTH  TWICE DAILY WITH MEALS  . ferrous sulfate (IRON SUPPLEMENT) 325 (65 FE) MG tablet Take 1 tablet (325 mg total) by mouth 2 (two) times daily with a meal.  . gabapentin (NEURONTIN) 100 MG capsule TAKE 3 CAPSULES BY MOUTH  DAILY  . glucose blood (ONE TOUCH ULTRA TEST) test strip Use to test blood sugars 2 times daily.  . Lancets (ONETOUCH ULTRASOFT) lancets CHECK TWICE PER DAY BEFORE  BREAKFAST AND DINNER  . losartan (COZAAR) 25 MG tablet TAKE 1 TABLET BY MOUTH  DAILY  . LUMIGAN 0.01 % SOLN Place 1 drop into both eyes every evening.  . traMADol  (ULTRAM) 50 MG tablet Take 1 tablet (50 mg total) by mouth every 12 (twelve) hours as needed.  . TRULICITY 1.5 0000000 SOPN INJECT THE CONTENTS OF 1  PEN SUBCUTANEOUSLY WEEKLY  AS DIRECTED   No facility-administered encounter medications on file as of 08/09/2019.     Allergies (verified) Lisinopril and Metformin and related   History: Past Medical History:  Diagnosis Date  . Arthritis   . Chronic kidney disease   . Diabetes mellitus without complication ([place])   . Hypertension   . Iron deficiency    on oral iron supplements, has not required transfusion  . Mixed hyperlipidemia   . OSA on CPAP    compliant  . Thyroid nodule    removed   Past Surgical History:  Procedure Laterality Date  . ABDOMINAL HYSTERECTOMY  1996  . MASS EXCISION Right 2017   right arm mass removal  . THYROID SURGERY     left side removal benign nodule   Family History  Problem Relation Age of Onset  . Stroke Mother   . Hypertension Mother   . Heart disease Mother   . Heart attack Father   . Early death Father   . Hypertension Father   . Diabetes Sister   . Early death Sister   . Hypertension Sister   . Stroke Sister   . Prostate cancer Son   . Cancer Son   . Early death Son   . Breast cancer Cousin   .  Diabetes Sister   . Early death Sister   . Hyperlipidemia Sister   . Kidney disease Sister   . Depression Daughter   . Hypertension Daughter   . Hyperlipidemia Daughter   . Diabetes Sister   . Hyperlipidemia Sister   . Hypertension Sister   . Early death Brother   . Arthritis Brother   . Heart attack Brother   . Heart disease Brother   . Arthritis Brother    Social History   Socioeconomic History  . Marital status: Divorced    Spouse name: Not on file  . Number of children: 6  . Years of education: some college  . Highest education level: Not on file  Occupational History  . Occupation: Retired   Scientific laboratory technician  . Financial resource strain: Not on file  . Food insecurity     Worry: Not on file    Inability: Not on file  . Transportation needs    Medical: Not on file    Non-medical: Not on file  Tobacco Use  . Smoking status: Former Research scientist (life sciences)  . Smokeless tobacco: Never Used  . Tobacco comment: only socially in high school  Substance and Sexual Activity  . Alcohol use: Not Currently  . Drug use: Never  . Sexual activity: Not on file  Lifestyle  . Physical activity    Days per week: Not on file    Minutes per session: Not on file  . Stress: Not on file  Relationships  . Social Herbalist on phone: Not on file    Gets together: Not on file    Attends religious service: Not on file    Active member of club or organization: Not on file    Attends meetings of clubs or organizations: Not on file    Relationship status: Not on file  Other Topics Concern  . 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 bring in CBG monitor from home?: No  How often do you need to have someone help you when you read instructions, pamphlets, or other written materials from your doctor or pharmacy?: 2 - Rarely  Interpreter Needed?: No  Information entered by :: Denman George LPN   Activities of Daily Living In your present state of health, do you have any difficulty performing the following activities: 08/09/2019  Hearing? N  Vision? N  Difficulty concentrating or making decisions? N  Walking or climbing stairs? N  Dressing or bathing? N  Doing errands, shopping? N  Preparing Food and eating ? N  Using the Toilet? N  In the past six months, have you  accidently leaked urine? N  Do you have problems with loss of bowel control? N  Managing your Medications? N  Managing your Finances? N  Housekeeping or managing your Housekeeping? N  Some recent data might be hidden     Immunizations and Health Maintenance Immunization History  Administered Date(s) Administered  . Fluad Quad(high Dose 65+) 07/02/2019  . Influenza, High Dose Seasonal PF 07/14/2018  . Pneumococcal Polysaccharide-23 11/13/2018  .  Tdap 08/05/2018  . Zoster Recombinat (Shingrix) 06/05/2018, 08/05/2018   Health Maintenance Due  Topic Date Due  . Hepatitis C Screening  [birth year]  . FOOT EXAM  07/15/1956  . COLONOSCOPY  07/15/1996    Patient Care Team: Inda Coke, [place] as PCP - General (Physician Assistant) Nigel Mormon, MD as Consulting Physician (Cardiology) [name], MD as Consulting Physician (Nephrology) Minette Brine, FNP (General Practice) Doh[place]ier, Asencion Partridge, MD as Consulting Physician (Neurology) [facility] as Consulting Physician  Indicate any recent Medical Services you may have received from other than [facility] providers in the past year (date may be approximate).     Assessment:   This is a routine wellness examination for [place].  Hearing/Vision screen No exam data present  Dietary issues and exercise activities discussed: Current Exercise Habits: The patient does not participate in regular exercise at present  Goals   None    Depression Screen PHQ 2/9 Scores 08/09/2019 08/13/2018  PHQ - 2 Score 0 0    Fall Risk Fall Risk  08/09/2019 08/13/2018  Falls in the past year? 0 No  Injury with Fall? 0 -  Follow up Falls evaluation completed;Education provided;Falls prevention discussed -    Is the patient's home free of loose throw rugs in walkways, pet beds, electrical cords, etc?   yes      Grab bars in the bathroom? yes      Handrails on the stairs?   yes      Adequate lighting?   yes  Cognitive  Function: no cognitive concerns at this time    6CIT Screen 08/09/2019  What Year? 0 points  What month? 0 points  What time? 0 points  Count back from 20 0 points  Months in reverse 0 points  Repeat phrase 0 points  Total Score 0    Screening Tests Health Maintenance  Topic Date Due  . Hepatitis C Screening  24-Apr-1946  . FOOT EXAM  07/15/1956  . COLONOSCOPY  07/15/1996  . PNA vac Low Risk Adult (2 of 2 - PCV13) 11/14/2019  . OPHTHALMOLOGY EXAM  11/27/2019  . HEMOGLOBIN A1C  01/09/2020  . MAMMOGRAM  07/09/2020  . TETANUS/TDAP  08/05/2028  . INFLUENZA VACCINE  Completed  . DEXA SCAN  Completed    Qualifies for Shingles Vaccine? Discussed and patient will check with pharmacy for coverage.  Patient education handout provided   Cancer Screenings: Lung: Low Dose CT Chest recommended if Age 56-80 years, 30 pack-year currently smoking OR have quit w/in 15years. Patient does not qualify. Breast: Up to date on Mammogram? Yes; ordered today Up to date of Bone Density/Dexa? Yes; last 08/12/18 Colorectal: Cologuard information provided to patient; will discuss at next visit with pcp   Additional Screenings:  Hepatitis C Screening: needed    Plan:  I have personally reviewed and addressed the Medicare Annual Wellness questionnaire and have noted the following in the patient's chart:  A. Medical and social history B. Use of alcohol, tobacco or illicit drugs  C. Current medications and supplements D. Functional ability and status E.  Nutritional status F.  Physical activity G. Advance directives H. List of other physicians I.  Hospitalizations, surgeries, and ER visits in previous 12 months J.  [place] such as hearing and vision if needed, cognitive and depression L. Referrals, records requested, and appointments- mammogram ordered   In addition, I have reviewed and discussed with patient certain preventive protocols, quality metrics, and best practice  recommendations. A  written personalized care plan for preventive services as well as general preventive health recommendations were provided to patient.   Signed,  Denman George, LPN  Nurse Health Advisor   Nurse Notes: no additional

## 2019-08-09 NOTE — Progress Notes (Signed)
I have reviewed the documentation from the recent AWV done by Courtney Slade; I agree with the documentation and will follow up on any recommendations or abnormal findings as suggested. Hillary Struss, MD [place]    

## 2019-08-09 NOTE — Patient Instructions (Signed)
Catherine Mcconnell , Thank you for taking time to come for your Medicare Wellness Visit. I appreciate your ongoing commitment to your health goals. Please review the following plan we discussed and let me know if I can assist you in the future.   Screening recommendations/referrals: Colorectal Screening: recommended; please review the information on Cologuard Mammogram: ordered today Bone Density: up to date; last 08/12/18  Vision and Dental Exams: Recommended annual ophthalmology exams for early detection of glaucoma and other disorders of the eye Recommended annual dental exams for proper oral hygiene  Diabetic Exams: Diabetic Eye Exam: recommended yearly; scheduled with Dr. Katy Fitch  Diabetic Foot Exam: at next office visit   Vaccinations: Influenza vaccine: completed 07/02/19 Pneumococcal vaccine: up to date; last 11/13/18 Tdap vaccine: up to date; last 08/05/18  Shingles vaccine: Shingrix completed   Advanced directives: Advance directives discussed with you today. I have provided information on how to complete the notarization portion.  Please bring a copy of your POA (Power of [place] ) and/or Living Will to your next appointment.    Goals: Recommend to drink at least 6-8 8oz glasses of water per day and consume a balanced diet rich in fresh fruits and vegetables.   Work to lower A1C.   Next appointment: Please schedule your Annual Wellness Visit with your Nurse Health Advisor in one year.  Preventive Care 73 Years and Older, Female Preventive care refers to lifestyle choices and visits with your health care provider that can promote health and wellness. What does preventive care include?  A yearly physical exam. This is also called an annual well check.  Dental exams once or twice a year.  Routine eye exams. Ask your health care provider how often you should have your eyes checked.  Personal lifestyle choices, including:  Daily care of your teeth and gums.  Regular physical  activity.  Eating a healthy diet.  Avoiding tobacco and drug use.  Limiting alcohol use.  Practicing safe sex.  Taking low-dose aspirin every day if recommended by your health care provider.  Taking vitamin and mineral supplements as recommended by your health care provider. What happens during an annual well check? The services and screenings done by your health care provider during your annual well check will depend on your age, overall health, lifestyle risk factors, and family history of disease. Counseling  Your health care provider may ask you questions about your:  Alcohol use.  Tobacco use.  Drug use.  Emotional well-being.  Home and relationship well-being.  Sexual activity.  Eating habits.  History of falls.  Memory and ability to understand (cognition).  Work and work Statistician.  Reproductive health. Screening  You may have the following tests or measurements:  Height, weight, and BMI.  Blood pressure.  Lipid and cholesterol levels. These may be checked every 5 years, or more frequently if you are over 56 years old.  Skin check.  Lung cancer screening. You may have this screening every year starting at age 54 if you have a 30-pack-year history of smoking and currently smoke or have quit within the past 15 years.  Fecal occult blood test (FOBT) of the stool. You may have this test every year starting at age 85.  Flexible sigmoidoscopy or colonoscopy. You may have a sigmoidoscopy every 5 years or a colonoscopy every 10 years starting at age 22.  Hepatitis C blood test.  Hepatitis B blood test.  Sexually transmitted disease (STD) testing.  Diabetes screening. This is done by checking your  blood sugar (glucose) after you have not eaten for a while (fasting). You may have this done every 1-3 years.  Bone density scan. This is done to screen for osteoporosis. You may have this done starting at age 82.  Mammogram. This may be done every 1-2  years. Talk to your health care provider about how often you should have regular mammograms. Talk with your health care provider about your test results, treatment options, and if necessary, the need for more tests. Vaccines  Your health care provider may recommend certain vaccines, such as:  Influenza vaccine. This is recommended every year.  Tetanus, diphtheria, and acellular pertussis (Tdap, Td) vaccine. You may need a Td booster every 10 years.  Zoster vaccine. You may need this after age 76.  Pneumococcal 13-valent conjugate (PCV13) vaccine. One dose is recommended after age 54.  Pneumococcal polysaccharide (PPSV23) vaccine. One dose is recommended after age 42. Talk to your health care provider about which screenings and vaccines you need and how often you need them. This information is not intended to replace advice given to you by your health care provider. Make sure you discuss any questions you have with your health care provider. Document Released: 10/27/2015 Document Revised: 06/19/2016 Document Reviewed: 08/01/2015 Elsevier Interactive Patient Education  2017 [place] Prevention in the Home Falls can cause injuries. They can happen to people of all ages. There are many things you can do to make your home safe and to help prevent falls. What can I do on the outside of my home?  Regularly fix the edges of walkways and driveways and fix any cracks.  Remove anything that might make you trip as you walk through a door, such as a raised step or threshold.  Trim any bushes or trees on the path to your home.  Use bright outdoor lighting.  Clear any walking paths of anything that might make someone trip, such as rocks or tools.  Regularly check to see if handrails are loose or broken. Make sure that both sides of any steps have handrails.  Any raised decks and porches should have guardrails on the edges.  Have any leaves, snow, or ice cleared regularly.  Use  sand or salt on walking paths during winter.  Clean up any spills in your garage right away. This includes oil or grease spills. What can I do in the bathroom?  Use night lights.  Install grab bars by the toilet and in the tub and shower. Do not use towel bars as grab bars.  Use non-skid mats or decals in the tub or shower.  If you need to sit down in the shower, use a plastic, non-slip stool.  Keep the floor dry. Clean up any water that spills on the floor as soon as it happens.  Remove soap buildup in the tub or shower regularly.  Attach bath mats securely with double-sided non-slip rug tape.  Do not have throw rugs and other things on the floor that can make you trip. What can I do in the bedroom?  Use night lights.  Make sure that you have a light by your bed that is easy to reach.  Do not use any sheets or blankets that are too big for your bed. They should not hang down onto the floor.  Have a firm chair that has side arms. You can use this for support while you get dressed.  Do not have throw rugs and other things on the floor that  can make you trip. What can I do in the kitchen?  Clean up any spills right away.  Avoid walking on wet floors.  Keep items that you use a lot in easy-to-reach places.  If you need to reach something above you, use a strong step stool that has a grab bar.  Keep electrical cords out of the way.  Do not use floor polish or wax that makes floors slippery. If you must use wax, use non-skid floor wax.  Do not have throw rugs and other things on the floor that can make you trip. What can I do with my stairs?  Do not leave any items on the stairs.  Make sure that there are handrails on both sides of the stairs and use them. Fix handrails that are broken or loose. Make sure that handrails are as long as the stairways.  Check any carpeting to make sure that it is firmly attached to the stairs. Fix any carpet that is loose or worn.  Avoid  having throw rugs at the top or bottom of the stairs. If you do have throw rugs, attach them to the floor with carpet tape.  Make sure that you have a light switch at the top of the stairs and the bottom of the stairs. If you do not have them, ask someone to add them for you. What else can I do to help prevent falls?  Wear shoes that:  Do not have high heels.  Have rubber bottoms.  Are comfortable and fit you well.  Are closed at the toe. Do not wear sandals.  If you use a stepladder:  Make sure that it is fully opened. Do not climb a closed stepladder.  Make sure that both sides of the stepladder are locked into place.  Ask someone to hold it for you, if possible.  Clearly mark and make sure that you can see:  Any grab bars or handrails.  First and last steps.  Where the edge of each step is.  Use tools that help you move around (mobility aids) if they are needed. These include:  Canes.  Walkers.  Scooters.  Crutches.  Turn on the lights when you go into a dark area. Replace any light bulbs as soon as they burn out.  Set up your furniture so you have a clear path. Avoid moving your furniture around.  If any of your floors are uneven, fix them.  If there are any pets around you, be aware of where they are.  Review your medicines with your doctor. Some medicines can make you feel dizzy. This can increase your chance of falling. Ask your doctor what other things that you can do to help prevent falls. This information is not intended to replace advice given to you by your health care provider. Make sure you discuss any questions you have with your health care provider. Document Released: 07/27/2009 Document Revised: 03/07/2016 Document Reviewed: 11/04/2014 Elsevier Interactive Patient Education  2017 [employer].

## 2019-08-10 ENCOUNTER — Telehealth: Payer: Self-pay | Admitting: Physical Therapy

## 2019-08-10 ENCOUNTER — Ambulatory Visit (INDEPENDENT_AMBULATORY_CARE_PROVIDER_SITE_OTHER): Payer: Medicare Other | Admitting: Endocrinology

## 2019-08-10 ENCOUNTER — Encounter: Payer: Self-pay | Admitting: Endocrinology

## 2019-08-10 VITALS — BP 126/72 | HR 62 | Ht 67.0 in | Wt 158.8 lb

## 2019-08-10 DIAGNOSIS — E119 Type 2 diabetes mellitus without complications: Secondary | ICD-10-CM

## 2019-08-10 MED ORDER — TRULICITY 1.5 MG/0.5ML ~~LOC~~ SOAJ: 1.5000 mg | SUBCUTANEOUS | 3 refills | Status: DC

## 2019-08-10 MED ORDER — CANAGLIFLOZIN 300 MG PO TABS: 300.0000 mg | ORAL_TABLET | Freq: Every day | ORAL | 3 refills | Status: DC

## 2019-08-10 NOTE — Telephone Encounter (Signed)
Copied from [place] [id number]. Topic: General - Other >> Aug 10, 2019 11:15 AM Keene Breath wrote: Reason for CRM: Patient's daughter would like the nurse or doctor to call her regarding an appt. The patient had today with an Endo doctor.  Please call Willimena at [phone]

## 2019-08-10 NOTE — Patient Instructions (Addendum)
good diet and exercise significantly improve the control of your diabetes.  please let me know if you wish to be referred to a dietician.  high blood sugar is very risky to your health.  you should see an eye doctor and dentist every year.  It is very important to get all recommended vaccinations.  Controlling your blood pressure and cholesterol drastically reduces the damage diabetes does to your body.  Those who smoke should quit.  Please discuss these with your doctor.  check your blood sugar once a day.  vary the time of day when you check, between before the 3 meals, and at bedtime.  also check if you have symptoms of your blood sugar being too high or too low.  please keep a record of the readings and bring it to your next appointment here (or you can bring the meter itself).  You can write it on any piece of paper.  please call us sooner if your blood sugar goes below 70, or if you have a lot of readings over 200. Please continue the same Trulicity, and:  I have sent a prescription to your pharmacy, to add "Invokana." Our goal will be to see if we can get the blood sugar better without taking insulin.   Please come back for a follow-up appointment in 1 month.

## 2019-08-10 NOTE — Telephone Encounter (Signed)
Samantha, please see message. 

## 2019-08-10 NOTE — Telephone Encounter (Signed)
Spoke to pt's daughter, she is called about today's visit with Dr. Loanne Drilling. She and pt were not happy with visit, he did not listen to them at all and was rushing them, did not give them any education and just said pt will have to go on Insulin for the rest of her life if started and said see you back in a month. Catherine Mcconnell said pt has been doing very well on watching her sugars and exercising, sugars past 2 mornings were 124 and 130. They would like to see another provider. [name] I will send [name] the message and see what we can do, she is out of the office till Thurs but I will get back to you. Catherine Mcconnell verbalized understanding.

## 2019-08-10 NOTE — Progress Notes (Signed)
Subjective:    Patient ID: Catherine Mcconnell, female    DOB: [birth year], 73 y.o.   MRN: [id number]  HPI pt is referred by Inda Coke, PA, for diabetes.  Pt states DM was dx'ed in 1998; she has mild if any neuropathy of the lower extremities, but she has associated renal failure; she has never been on insulin; pt says her diet and exercise are improved recently; she has never had GDM, pancreatitis, pancreatic surgery, severe hypoglycemia or DKA.  Pt says cbg's are in the 100's.  He takes Trulicity only, as renal failure limits rx options Past Medical History:  Diagnosis Date  . Arthritis   . Chronic kidney disease   . Diabetes mellitus without complication ([place])   . Hypertension   . Iron deficiency    on oral iron supplements, has not required transfusion  . Mixed hyperlipidemia   . OSA on CPAP    compliant  . Thyroid nodule    removed    Past Surgical History:  Procedure Laterality Date  . ABDOMINAL HYSTERECTOMY  1996  . MASS EXCISION Right 2017   right arm mass removal  . THYROID SURGERY     left side removal benign nodule    Social History   Socioeconomic History  . Marital status: Divorced    Spouse name: Not on file  . Number of children: 6  . Years of education: some college  . Highest education level: Not on file  Occupational History  . Occupation: Retired   Scientific laboratory technician  . Financial resource strain: Not on file  . Food insecurity    Worry: Not on file    Inability: Not on file  . Transportation needs    Medical: Not on file    Non-medical: Not on file  Tobacco Use  . Smoking status: Former Research scientist (life sciences)  . Smokeless tobacco: Never Used  . Tobacco comment: only socially in high school  Substance and Sexual Activity  . Alcohol use: Not Currently  . Drug use: Never  . Sexual activity: Not on file  Lifestyle  . Physical activity    Days per week: Not on file    Minutes per session: Not on file  . Stress: Not on file  Relationships  . Social  Herbalist on phone: Not on file    Gets together: Not on file    Attends religious service: Not on file    Active member of club or organization: Not on file    Attends meetings of clubs or organizations: Not on file    Relationship status: Not on file  . Intimate partner violence    Fear of current or ex partner: Not on file    Emotionally abused: Not on file    Physically abused: Not on file    Forced sexual activity: Not on file  Other Topics Concern  . Not on file  Social History Narrative   Mother of 56   --Two children have passed, one from from prostate cancer and one from liver failure   Used to be a CNA, [employer]   Recently moved back to [place]    Current Outpatient Medications on File Prior to Visit  Medication Sig Dispense Refill  . amLODipine (NORVASC) 10 MG tablet TAKE 1 TABLET BY MOUTH  DAILY 90 tablet 1  . aspirin EC 81 MG tablet Take 81 mg by mouth daily.    Marland Kitchen atorvastatin (LIPITOR) 40 MG tablet TAKE 1 TABLET BY MOUTH  DAILY 90 tablet 3  . carvedilol (COREG) 6.25 MG tablet TAKE 1 TABLET BY MOUTH  TWICE DAILY WITH MEALS 180 tablet 3  . ferrous sulfate (IRON SUPPLEMENT) 325 (65 FE) MG tablet Take 1 tablet (325 mg total) by mouth 2 (two) times daily with a meal. (Patient taking differently: Take 325 mg by mouth daily. ) 60 tablet 2  . gabapentin (NEURONTIN) 100 MG capsule TAKE 3 CAPSULES BY MOUTH  DAILY 270 capsule 1  . glucose blood (ONE TOUCH ULTRA TEST) test strip Use to test blood sugars 2 times daily.    . Lancets (ONETOUCH ULTRASOFT) lancets CHECK TWICE PER DAY BEFORE  BREAKFAST AND DINNER 200 each 1  . losartan (COZAAR) 25 MG tablet TAKE 1 TABLET BY MOUTH  DAILY 90 tablet 1  . LUMIGAN 0.01 % SOLN Place 1 drop into both eyes every evening.    . traMADol (ULTRAM) 50 MG tablet Take 1 tablet (50 mg total) by mouth every 12 (twelve) hours as needed. 30 tablet 1   No current facility-administered medications on file prior to visit.     Allergies  Allergen  Reactions  . Lisinopril Rash  . Metformin And Related Swelling    Family History  Problem Relation Age of Onset  . Stroke Mother   . Hypertension Mother   . Heart disease Mother   . Heart attack Father   . Early death Father   . Hypertension Father   . Diabetes Sister   . Early death Sister   . Hypertension Sister   . Stroke Sister   . Prostate cancer Son   . Cancer Son   . Early death Son   . Breast cancer Cousin   . Diabetes Sister   . Early death Sister   . Hyperlipidemia Sister   . Kidney disease Sister   . Depression Daughter   . Hypertension Daughter   . Hyperlipidemia Daughter   . Diabetes Sister   . Hyperlipidemia Sister   . Hypertension Sister   . Early death Brother   . Arthritis Brother   . Heart attack Brother   . Heart disease Brother   . Arthritis Brother     BP 126/72 (BP Location: Left Arm, Patient Position: Sitting, Cuff Size: Normal)   Pulse 62   Ht 5\' 7"  (1.702 m)   Wt 158 lb 12.8 oz (72 kg)   SpO2 97%   BMI 24.87 kg/m    Review of Systems denies weight loss, blurry vision, headache, chest pain, sob, n/v, urinary frequency, muscle cramps, excessive diaphoresis, memory loss, depression, cold intolerance, rhinorrhea, and easy bruising.       Objective:   Physical Exam VS: see vs page GEN: no distress HEAD: head: no deformity eyes: no periorbital swelling, no proptosis external nose and ears are normal NECK: supple, thyroid is not enlarged CHEST WALL: no deformity LUNGS: clear to auscultation CV: reg rate and rhythm, no murmur ABD: abdomen is soft, nontender.  no hepatosplenomegaly.  not distended.  no hernia MUSCULOSKELETAL: muscle bulk and strength are grossly normal.  no obvious joint swelling.  gait is normal and steady EXTEMITIES: no deformity.  no ulcer on the feet.  feet are of normal color and temp.  no edema.  Several ingrown toenails, but no swell/erythema/drainage.  there is bilateral onychomycosis of the toenails.   PULSES:  dorsalis pedis intact bilat.  no carotid bruit.   NEURO:  cn 2-12 grossly intact.   readily moves all 4's.  sensation is  intact to touch on the feet.   SKIN:  Normal texture and temperature.  No rash or suspicious lesion is visible.   NODES:  None palpable at the neck.   PSYCH: alert, well-oriented.  Does not appear anxious nor depressed.    Lab Results  Component Value Date   HGBA1C 10.0 (H) 07/12/2019   Lab Results  Component Value Date   CREATININE 1.60 (H) 07/12/2019   BUN 26 (H) 07/12/2019   NA 135 07/12/2019   K 4.0 07/12/2019   CL 99 07/12/2019   CO2 27 07/12/2019   I have reviewed outside records, and summarized: Pt was noted to have elevated a1c, and referred here.  Worsening of renal function was also noted.   I personally reviewed electrocardiogram tracing (06/18/11): Indication: preop Impression: NSR.  No MI.  LVH No comparison is available      Assessment & Plan:  Type 2 DM: severe exacerbation: She declines insulin.  Renal failure: This limits rx options.   Patient Instructions  good diet and exercise significantly improve the control of your diabetes.  please let me know if you wish to be referred to a dietician.  high blood sugar is very risky to your health.  you should see an eye doctor and dentist every year.  It is very important to get all recommended vaccinations.  Controlling your blood pressure and cholesterol drastically reduces the damage diabetes does to your body.  Those who smoke should quit.  Please discuss these with your doctor.  check your blood sugar once a day.  vary the time of day when you check, between before the 3 meals, and at bedtime.  also check if you have symptoms of your blood sugar being too high or too low.  please keep a record of the readings and bring it to your next appointment here (or you can bring the meter itself).  You can write it on any piece of paper.  please call us sooner if your blood sugar goes below 70, or if you have  a lot of readings over 200. Please continue the same Trulicity, and:  I have sent a prescription to your pharmacy, to add "Invokana." Our goal will be to see if we can get the blood sugar better without taking insulin.   Please come back for a follow-up appointment in 1 month.

## 2019-08-12 NOTE — Telephone Encounter (Signed)
Spoke to pt's daughter Rockwell [place] and told her [name] said she is happy to her follow up with here when she is due for her next A1c and can re discuss at that time. Willimena verbalized understanding, and appt already scheduled.

## 2019-08-12 NOTE — Telephone Encounter (Signed)
I am happy for her to follow-up with me when her hemoglobin A1c is due we can rediscuss this.

## 2019-08-19 ENCOUNTER — Other Ambulatory Visit: Payer: Self-pay | Admitting: Physician Assistant

## 2019-09-06 LAB — HM DIABETES EYE EXAM

## 2019-09-07 ENCOUNTER — Other Ambulatory Visit: Payer: Self-pay

## 2019-09-07 ENCOUNTER — Ambulatory Visit
Admission: RE | Admit: 2019-09-07 | Discharge: 2019-09-07 | Disposition: A | Discharge status: Home / Self Care | Payer: Medicare Other | Source: Ambulatory Visit | Attending: Family Medicine | Admitting: Family Medicine

## 2019-09-07 DIAGNOSIS — Z1231 Encounter for screening mammogram for malignant neoplasm of breast: Secondary | ICD-10-CM

## 2019-09-15 ENCOUNTER — Ambulatory Visit: Payer: Medicare Other | Admitting: Endocrinology

## 2019-09-23 ENCOUNTER — Encounter: Payer: Self-pay | Admitting: Physician Assistant

## 2019-10-09 ENCOUNTER — Other Ambulatory Visit: Payer: Self-pay | Admitting: Nurse Practitioner

## 2019-10-13 ENCOUNTER — Other Ambulatory Visit: Payer: Self-pay | Admitting: *Deleted

## 2019-10-13 MED ORDER — ONETOUCH ULTRASOFT LANCETS MISC: 4 refills | Status: DC

## 2019-10-18 ENCOUNTER — Other Ambulatory Visit: Payer: Self-pay

## 2019-10-19 ENCOUNTER — Ambulatory Visit (INDEPENDENT_AMBULATORY_CARE_PROVIDER_SITE_OTHER): Payer: Medicare Other | Admitting: Physician Assistant

## 2019-10-19 ENCOUNTER — Encounter: Payer: Self-pay | Admitting: Physician Assistant

## 2019-10-19 VITALS — BP 158/70 | HR 62 | Temp 97.9°F | Ht 67.0 in | Wt 159.2 lb

## 2019-10-19 DIAGNOSIS — E119 Type 2 diabetes mellitus without complications: Secondary | ICD-10-CM

## 2019-10-19 LAB — POCT GLYCOSYLATED HEMOGLOBIN (HGB A1C): Hemoglobin A1C: 7.7 % — AB (ref 4.0–5.6)

## 2019-10-19 MED ORDER — TRULICITY 1.5 MG/0.5ML ~~LOC~~ SOAJ: 1.5000 mg | SUBCUTANEOUS | 2 refills | Status: DC

## 2019-10-19 NOTE — Patient Instructions (Signed)
It was great to see you!  Keep up the good work!!!! I'm so very proud of you.  Let's follow-up after January 18 2020, sooner if you have concerns.  Take care,  Inda Coke PA-C

## 2019-10-19 NOTE — Progress Notes (Signed)
Catherine Mcconnell is a 74 y.o. female here for a follow up of a pre-existing problem.  I acted as a Education administrator for [employer], PA-C Anselmo Pickler, LPN  History of Present Illness:   Chief Complaint  Patient presents with  . Diabetes    HPI   Diabetes 3 month follow-up. Current DM meds: Trulicity 1.5 mg weekly. Blood sugars at home are: averaging 115-150. This morning was 167. Patient is compliant with medication. Denies: hypoglycemic or hyperglycemic episodes or symptoms. Her diabetes is complicated by her CKD, last GFR was 25 -- sees [facility]  Lab Results  Component Value Date   HGBA1C 7.7 (A) 10/19/2019   Wt Readings from Last 4 Encounters:  10/19/19 159 lb 4 oz (72.2 kg)  08/10/19 158 lb 12.8 oz (72 kg)  07/14/19 161 lb 4 oz (73.1 kg)  01/28/19 157 lb 3.2 oz (71.3 kg)      Past Medical History:  Diagnosis Date  . Arthritis   . Chronic kidney disease   . Diabetes mellitus without complication ([place])   . Hypertension   . Iron deficiency    on oral iron supplements, has not required transfusion  . Mixed hyperlipidemia   . OSA on CPAP    compliant  . Thyroid nodule    removed     Social History   Socioeconomic History  . Marital status: Divorced    Spouse name: Not on file  . Number of children: 6  . Years of education: some college  . Highest education level: Not on file  Occupational History  . Occupation: Retired   Tobacco Use  . Smoking status: Former Research scientist (life sciences)  . Smokeless tobacco: Never Used  . Tobacco comment: only socially in high school  Substance and Sexual Activity  . Alcohol use: Not Currently  . Drug use: Never  . Sexual activity: Not on file  Other Topics Concern  . Not on file  Social History Narrative   Mother of 5   --Two children have passed, one from from prostate cancer and one from liver failure   Used to be a CNA, Child psychotherapist   Recently moved back to [facility]   Financial Resource Strain:   .  Difficulty of Paying Living Expenses: Not on file  Food Insecurity:   . Worried About Charity fundraiser in the Last Year: Not on file  . Ran Out of Food in the Last Year: Not on file  Transportation Needs:   . Lack of Transportation (Medical): Not on file  . Lack of Transportation (Non-Medical): Not on file  Physical Activity:   . Days of Exercise per Week: Not on file  . Minutes of Exercise per Session: Not on file  Stress:   . Feeling of Stress : Not on file  Social Connections:   . Frequency of Communication with Friends and Family: Not on file  . Frequency of Social Gatherings with Friends and Family: Not on file  . Attends Religious Services: Not on file  . Active Member of Clubs or Organizations: Not on file  . Attends Archivist Meetings: Not on file  . Marital Status: Not on file  Intimate Partner Violence:   . Fear of Current or Ex-Partner: Not on file  . Emotionally Abused: Not on file  . Physically Abused: Not on file  . Sexually Abused: Not on file    Past Surgical History:  Procedure Laterality Date  . ABDOMINAL HYSTERECTOMY  [place] Right 2017   right arm mass removal  . THYROID SURGERY     left side removal benign nodule    Family History  Problem Relation Age of Onset  . Stroke Mother   . Hypertension Mother   . Heart disease Mother   . Heart attack Father   . Early death Father   . Hypertension Father   . Diabetes Sister   . Early death Sister   . Hypertension Sister   . Stroke Sister   . Prostate cancer Son   . Cancer Son   . Early death Son   . Breast cancer Cousin   . Diabetes Sister   . Early death Sister   . Hyperlipidemia Sister   . Kidney disease Sister   . Depression Daughter   . Hypertension Daughter   . Hyperlipidemia Daughter   . Diabetes Sister   . Hyperlipidemia Sister   . Hypertension Sister   . Early death Brother   . Arthritis Brother   . Heart attack Brother   . Heart disease Brother   .  Arthritis Brother     Allergies  Allergen Reactions  . Lisinopril Rash  . Metformin And Related Swelling    Current Medications:   Current Outpatient Medications:  .  amLODipine (NORVASC) 10 MG tablet, TAKE 1 TABLET BY MOUTH  DAILY, Disp: 90 tablet, Rfl: 1 .  aspirin EC 81 MG tablet, Take 81 mg by mouth daily., Disp: , Rfl:  .  atorvastatin (LIPITOR) 40 MG tablet, TAKE 1 TABLET BY MOUTH  DAILY, Disp: 90 tablet, Rfl: 3 .  carvedilol (COREG) 6.25 MG tablet, TAKE 1 TABLET BY MOUTH  TWICE DAILY WITH MEALS, Disp: 180 tablet, Rfl: 3 .  ferrous sulfate (IRON SUPPLEMENT) 325 (65 FE) MG tablet, Take 1 tablet (325 mg total) by mouth 2 (two) times daily with a meal. (Patient taking differently: Take 325 mg by mouth daily. ), Disp: 60 tablet, Rfl: 2 .  gabapentin (NEURONTIN) 100 MG capsule, TAKE 3 CAPSULES BY MOUTH  DAILY, Disp: 270 capsule, Rfl: 1 .  glucose blood (ONE TOUCH ULTRA TEST) test strip, Use to test blood sugars 2 times daily., Disp: , Rfl:  .  Lancets (ONETOUCH ULTRASOFT) lancets, CHECK TWICE PER DAY BEFORE  BREAKFAST AND DINNER, Disp: 200 each, Rfl: 4 .  losartan (COZAAR) 25 MG tablet, TAKE 1 TABLET BY MOUTH  DAILY, Disp: 90 tablet, Rfl: 1 .  LUMIGAN 0.01 % SOLN, Place 1 drop into both eyes every evening., Disp: , Rfl:  .  traMADol (ULTRAM) 50 MG tablet, Take 1 tablet (50 mg total) by mouth every 12 (twelve) hours as needed., Disp: 30 tablet, Rfl: 1 .  TRULICITY 1.5 0000000 SOPN, INJECT THE CONTENTS OF 1  PEN SUBCUTANEOUSLY WEEKLY  AS DIRECTED, Disp: 4 pen, Rfl: 2   Review of Systems:   ROS Negative unless otherwise specified per HPI.  Vitals:   Vitals:   10/19/19 0954  BP: (!) 158/70  Pulse: 62  Temp: 97.9 F (36.6 C)  TempSrc: Oral  SpO2: 98%  Weight: 159 lb 4 oz (72.2 kg)  Height: 5\' 7"  (1.702 m)     Body mass index is 24.94 kg/m.  Physical Exam:   Physical Exam Vitals and nursing note reviewed.  Constitutional:      General: She is not in acute distress.     Appearance: She is well-developed. She is not ill-appearing or toxic-appearing.  Cardiovascular:  Rate and Rhythm: Normal rate and regular rhythm.     Pulses: Normal pulses.     Heart sounds: Normal heart sounds, S1 normal and S2 normal.     Comments: No LE edema Pulmonary:     Effort: Pulmonary effort is normal.     Breath sounds: Normal breath sounds.  Skin:    General: Skin is warm and dry.  Neurological:     Mental Status: She is alert.     GCS: GCS eye subscore is 4. GCS verbal subscore is 5. GCS motor subscore is 6.  Psychiatric:        Speech: Speech normal.        Behavior: Behavior normal. Behavior is cooperative.     Results for orders placed or performed in visit on 10/19/19  POCT HgB A1C  Result Value Ref Range   Hemoglobin A1C 7.7 (A) 4.0 - 5.6 %    Assessment and Plan:   Zuleidy was seen today for diabetes.  Diagnoses and all orders for this visit:  Diabetes mellitus without complication ([place]) Significant improvement of HgbA1c over the past 3 months. Continue Trulicity 1.5 mg weekly and follow-up in 3 months, sooner if concerns. -     POCT HgB A1C  . Reviewed expectations re: course of current medical issues. . Discussed self-management of symptoms. . Outlined signs and symptoms indicating need for more acute intervention. . Patient verbalized understanding and all questions were answered. . See orders for this visit as documented in the electronic medical record. . Patient received an After-Visit Summary.  CMA or LPN served as scribe during this visit. History, Physical, and Plan performed by medical provider. The above documentation has been reviewed and is accurate and complete.   Inda Coke, PA-C

## 2019-10-21 ENCOUNTER — Telehealth: Payer: Self-pay | Admitting: Physician Assistant

## 2019-10-21 ENCOUNTER — Other Ambulatory Visit: Payer: Self-pay | Admitting: Physician Assistant

## 2019-10-21 MED ORDER — GLUCOSE BLOOD VI STRP: ORAL_STRIP | 4 refills | Status: DC

## 2019-10-21 MED ORDER — TRAMADOL HCL 50 MG PO TABS: 50.0000 mg | ORAL_TABLET | Freq: Two times a day (BID) | ORAL | 1 refills | Status: DC | PRN

## 2019-10-21 NOTE — Telephone Encounter (Signed)
Patient called in needing a refill on Tramadol and her test strips, and for it to be sent to [address], [place], [place] [address]

## 2019-10-21 NOTE — Telephone Encounter (Signed)
Catherine Mcconnell, pt requesting refill on Tramadol.

## 2019-10-21 NOTE — Telephone Encounter (Signed)
Tramadol refill sent 

## 2019-10-21 NOTE — Telephone Encounter (Signed)
Spoke to pt told her I will send in Rx for test strips but Catherine Mcconnell has left for the day so the Tramadol will not be sent until tomorrow. Pt verbalized understanding. Rx sent for Test strips.

## 2019-10-22 NOTE — Telephone Encounter (Signed)
Left message on personal voicemail that [facility] sent Rx to pharmacy for you.

## 2019-11-12 ENCOUNTER — Other Ambulatory Visit: Payer: Self-pay | Admitting: *Deleted

## 2019-11-12 MED ORDER — GLUCOSE BLOOD VI STRP: ORAL_STRIP | 4 refills | Status: DC

## 2020-01-11 ENCOUNTER — Other Ambulatory Visit: Payer: Self-pay | Admitting: Physician Assistant

## 2020-01-11 DIAGNOSIS — I10 Essential (primary) hypertension: Secondary | ICD-10-CM

## 2020-01-19 ENCOUNTER — Other Ambulatory Visit: Payer: Self-pay

## 2020-01-19 ENCOUNTER — Encounter: Payer: Self-pay | Admitting: Physician Assistant

## 2020-01-19 ENCOUNTER — Ambulatory Visit (INDEPENDENT_AMBULATORY_CARE_PROVIDER_SITE_OTHER): Payer: Medicare Other | Admitting: Physician Assistant

## 2020-01-19 VITALS — BP 134/78 | HR 60 | Temp 97.0°F | Ht 67.0 in | Wt 156.2 lb

## 2020-01-19 DIAGNOSIS — Z1159 Encounter for screening for other viral diseases: Secondary | ICD-10-CM | POA: Diagnosis not present

## 2020-01-19 DIAGNOSIS — L608 Other nail disorders: Secondary | ICD-10-CM

## 2020-01-19 DIAGNOSIS — E119 Type 2 diabetes mellitus without complications: Secondary | ICD-10-CM

## 2020-01-19 LAB — POCT GLYCOSYLATED HEMOGLOBIN (HGB A1C): Hemoglobin A1C: 8.4 % — AB (ref 4.0–5.6)

## 2020-01-19 LAB — BASIC METABOLIC PANEL
BUN: 19 mg/dL (ref 6–23)
CO2: 32 mEq/L (ref 19–32)
Calcium: 9.3 mg/dL (ref 8.4–10.5)
Chloride: 102 mEq/L (ref 96–112)
Creatinine, Ser: 1.47 mg/dL — ABNORMAL HIGH (ref 0.40–1.20)
GFR: 42.1 mL/min — ABNORMAL LOW (ref >60.00)
Glucose, Bld: 183 mg/dL — ABNORMAL HIGH (ref 70–99)
Potassium: 4.1 mEq/L (ref 3.5–5.1)
Sodium: 138 mEq/L (ref 135–145)

## 2020-01-19 NOTE — Patient Instructions (Signed)
It was great to see you!  Continue Trulicity -- keep me posted if you are having issues with the injection sites.  Vaccine tomorrow!  Let's follow-up in 91 days, sooner if you have concerns.  Take care,  Inda Coke PA-C

## 2020-01-19 NOTE — Progress Notes (Signed)
Catherine Mcconnell is a 74 y.o. female is here to discuss: Diabetes  I acted as a scibe to [employer], [place], [place]  History of Present Illness:   Chief Complaint  Patient presents with  . Diabetes    HPI  Diabetes Continues on Trulicity 1.5 mg weekly. She has noticed that she is starting to have some injection site irritation after each dose. She gives herself her medication in her arms/legs, has not tried her abdomen. Has had a stressful past few months at home due to plumbing issues and has therefore been eating out more. She denies any symptoms of low blood sugar, issues with feet (numbness/tingling, sores.) Does have thickened toenails. Has not had her kidney function updated in a while.      Health Maintenance Due  Topic Date Due  . Hepatitis C Screening  Never done  . COLONOSCOPY  Never done  . PNA vac Low Risk Adult (2 of 2 - PCV13) 11/14/2019    Past Medical History:  Diagnosis Date  . Arthritis   . Chronic kidney disease   . Diabetes mellitus without complication ([place])   . Hypertension   . Iron deficiency    on oral iron supplements, has not required transfusion  . Mixed hyperlipidemia   . OSA on CPAP    compliant  . Thyroid nodule    removed     Social History   Socioeconomic History  . Marital status: Divorced    Spouse name: Not on file  . Number of children: 6  . Years of education: some college  . Highest education level: Not on file  Occupational History  . Occupation: Retired   Tobacco Use  . Smoking status: Former Research scientist (life sciences)  . Smokeless tobacco: Never Used  . Tobacco comment: only socially in high school  Substance and Sexual Activity  . Alcohol use: Not Currently  . Drug use: Never  . Sexual activity: Not on file  Other Topics Concern  . 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 Difficulty of Paying Living Expenses:   Food Insecurity:   . Worried About Charity fundraiser in the Last Year:   . Arboriculturist in the Last Year:   Transportation Needs:   . Film/video editor (Medical):   Marland Kitchen Lack of Transportation (Non-Medical):   Physical Activity:   . Days of Exercise per Week:   . Minutes of Exercise per Session:   Stress:   . Feeling of Stress :   Social Connections:   . Frequency of Communication with Friends and Family:   . Frequency of Social Gatherings with Friends and Family:   . Attends Religious Services:   . Active Member of Clubs or Organizations:   . Attends Archivist Meetings:   Marland Kitchen Marital Status:   Intimate Partner Violence:   . Fear of Current or Ex-Partner:   . Emotionally Abused:   Marland Kitchen Physically Abused:   . Sexually Abused:     Past Surgical History:  Procedure Laterality Date  . ABDOMINAL HYSTERECTOMY  1996  . MASS EXCISION Right 2017   right arm mass removal  . THYROID SURGERY  left side removal benign nodule    Family History  Problem Relation Age of Onset  . Stroke Mother   . Hypertension Mother   . Heart disease Mother   . Heart attack Father   . Early death Father   . Hypertension Father   . Diabetes Sister   . Early death Sister   . Hypertension Sister   . Stroke Sister   . Prostate cancer Son   . Cancer Son   . Early death Son   . Breast cancer Cousin   . Diabetes Sister   . Early death Sister   . Hyperlipidemia Sister   . Kidney disease Sister   . Depression Daughter   . Hypertension Daughter   . Hyperlipidemia Daughter   . Diabetes Sister   . Hyperlipidemia Sister   . Hypertension Sister   . Early death Brother   . Arthritis Brother   . Heart attack Brother   . Heart disease Brother   . Arthritis Brother     PMHx, SurgHx, SocialHx, FamHx, Medications, and Allergies were reviewed in the Visit  Navigator and updated as appropriate.   Patient Active Problem List   Diagnosis Date Noted  . Diabetes mellitus without complication ([place]) [id number]  . OSA on CPAP   . Chronic kidney disease   . Hypertension   . Mixed hyperlipidemia     Social History   Tobacco Use  . Smoking status: Former Research scientist (life sciences)  . Smokeless tobacco: Never Used  . Tobacco comment: only socially in high school  Substance Use Topics  . Alcohol use: Not Currently  . Drug use: Never    Current Medications and Allergies:    Current Outpatient Medications:  .  amLODipine (NORVASC) 10 MG tablet, TAKE 1 TABLET BY MOUTH  DAILY, Disp: 90 tablet, Rfl: 3 .  aspirin EC 81 MG tablet, Take 81 mg by mouth daily., Disp: , Rfl:  .  atorvastatin (LIPITOR) 40 MG tablet, TAKE 1 TABLET BY MOUTH  DAILY, Disp: 90 tablet, Rfl: 3 .  carvedilol (COREG) 6.25 MG tablet, TAKE 1 TABLET BY MOUTH  TWICE DAILY WITH MEALS, Disp: 180 tablet, Rfl: 3 .  Dulaglutide (TRULICITY) 1.5 0000000 SOPN, Inject 1.5 mg into the skin once a week., Disp: 4 pen, Rfl: 2 .  ferrous sulfate (IRON SUPPLEMENT) 325 (65 FE) MG tablet, Take 1 tablet (325 mg total) by mouth 2 (two) times daily with a meal. (Patient taking differently: Take 325 mg by mouth daily. ), Disp: 60 tablet, Rfl: 2 .  gabapentin (NEURONTIN) 100 MG capsule, TAKE 3 CAPSULES BY MOUTH  DAILY, Disp: 270 capsule, Rfl: 1 .  glucose blood (ONE TOUCH ULTRA TEST) test strip, Use to test blood sugars 2 times daily., Disp: 100 each, Rfl: 4 .  Lancets (ONETOUCH ULTRASOFT) lancets, CHECK TWICE PER DAY BEFORE  BREAKFAST AND DINNER, Disp: 200 each, Rfl: 4 .  losartan (COZAAR) 25 MG tablet, TAKE 1 TABLET BY MOUTH  DAILY, Disp: 90 tablet, Rfl: 3 .  LUMIGAN 0.01 % SOLN, Place 1 drop into both eyes every evening., Disp: , Rfl:  .  traMADol (ULTRAM) 50 MG tablet, Take 1 tablet (50 mg total) by mouth every 12 (twelve) hours as needed., Disp: 30 tablet, Rfl: 1   Allergies  Allergen Reactions  . Lisinopril Rash    . Metformin And Related Swelling    Review of Systems   ROS  Negative unless otherwise specified per HPI.  Vitals:   Vitals:   01/19/20 1002  BP: 134/78  Pulse: 60  Temp: (!) 97 F (36.1 C)  TempSrc: Temporal  SpO2: 99%  Weight: 156 lb 3.2 oz (70.9 kg)  Height: 5\' 7"  (1.702 m)     Body mass index is 24.46 kg/m.   Physical Exam:    Physical Exam Vitals and nursing note reviewed.  Constitutional:      General: She is not in acute distress.    Appearance: She is well-developed. She is not ill-appearing or toxic-appearing.  Cardiovascular:     Rate and Rhythm: Normal rate and regular rhythm.     Pulses: Normal pulses.     Heart sounds: Normal heart sounds, S1 normal and S2 normal.     Comments: No LE edema Pulmonary:     Effort: Pulmonary effort is normal.     Breath sounds: Normal breath sounds.  Feet:     Comments: Thickened second toenails on bilateral feet Skin:    General: Skin is warm and dry.  Neurological:     Mental Status: She is alert.     GCS: GCS eye subscore is 4. GCS verbal subscore is 5. GCS motor subscore is 6.  Psychiatric:        Speech: Speech normal.        Behavior: Behavior normal. Behavior is cooperative.    Results for orders placed or performed in visit on 01/19/20  POCT glycosylated hemoglobin (Hb A1C)  Result Value Ref Range   Hemoglobin A1C 8.4 (A) 4.0 - 5.6 %   HbA1c POC (<> result, manual entry)     HbA1c, POC (prediabetic range)     HbA1c, POC (controlled diabetic range)     Diabetic Foot Exam - Simple   Simple Foot Form Diabetic Foot exam was performed with the following findings: Yes 01/19/2020 10:35 AM  Visual Inspection No deformities, no ulcerations, no other skin breakdown bilaterally: Yes Sensation Testing Intact to touch and monofilament testing bilaterally: Yes Pulse Check Posterior Tibialis and Dorsalis pulse intact bilaterally: Yes Comments     Assessment and Plan:    Cordia was seen today for  diabetes.  Diagnoses and all orders for this visit:  Diabetes mellitus without complication ([place]) Uncontrolled, however suspect due to recent dietary noncompliance. When motivated, she has done better in the past. Discussed need for better diet control. We are going to maintain Trulicity 1.5 mg weekly, and trial abdominal injections. If she still has concerns with the injection sites, will stop injections and possibly switch to 100 mg ivokana. Patient will let me know via mychart if she has issues. -     POCT glycosylated hemoglobin (Hb A1C) -     Basic metabolic panel  Encounter for screening for other viral diseases -     Hepatitis C Antibody  Toenail deformity -     Ambulatory referral to Podiatry    . Reviewed expectations re: course of current medical issues. . Discussed self-management of symptoms. . Outlined signs and symptoms indicating need for more acute intervention. . Patient verbalized understanding and all questions were answered. . See orders for this visit as documented in the electronic medical record. . Patient received an After Visit Summary.  CMA or LPN served as scribe during this visit. History, Physical, and Plan performed by medical provider. The above documentation has been reviewed and is accurate and complete.   Inda Coke, PA-C [place], [place] 01/19/2020  Follow-up: No follow-ups on file.

## 2020-01-20 ENCOUNTER — Ambulatory Visit: Payer: Medicare Other | Attending: Internal Medicine

## 2020-01-20 DIAGNOSIS — Z23 Encounter for immunization: Secondary | ICD-10-CM

## 2020-01-20 LAB — HEPATITIS C ANTIBODY
Hepatitis C Ab: NONREACTIVE
SIGNAL TO CUT-OFF: 0.02 (ref <1.00)

## 2020-01-20 NOTE — Progress Notes (Signed)
   Covid-19 Vaccination Clinic  Name:  Taneshia Guel    MRN: [id number] DOB: May 10, 1946  01/20/2020  Ms. Hamlyn was observed post Covid-19 immunization for 15 minutes without incident. She was provided with Vaccine Information Sheet and instruction to access the V-Safe system.   Ms. Caudle was instructed to call 911 with any severe reactions post vaccine: Marland Kitchen Difficulty breathing  . Swelling of face and throat  . A fast heartbeat  . A bad rash all over body  . Dizziness and weakness   Immunizations Administered    Name Date Dose VIS Date Route   Pfizer COVID-19 Vaccine 01/20/2020 10:28 AM 0.3 mL 09/24/2019 Intramuscular   Manufacturer: [place]   Lot: [id number]   [place]: [id number]

## 2020-01-25 NOTE — Progress Notes (Signed)
No show

## 2020-01-26 ENCOUNTER — Ambulatory Visit: Payer: Medicare Other | Admitting: Cardiology

## 2020-01-27 DIAGNOSIS — Z91199 Patient's noncompliance with other medical treatment and regimen due to unspecified reason: Secondary | ICD-10-CM | POA: Insufficient documentation

## 2020-01-27 DIAGNOSIS — Z5329 Procedure and treatment not carried out because of patient's decision for other reasons: Secondary | ICD-10-CM | POA: Insufficient documentation

## 2020-02-04 ENCOUNTER — Other Ambulatory Visit: Payer: Self-pay

## 2020-02-04 ENCOUNTER — Ambulatory Visit: Payer: Medicare Other | Admitting: Podiatry

## 2020-02-04 VITALS — Temp 96.5°F

## 2020-02-04 DIAGNOSIS — E1169 Type 2 diabetes mellitus with other specified complication: Secondary | ICD-10-CM

## 2020-02-04 DIAGNOSIS — E1142 Type 2 diabetes mellitus with diabetic polyneuropathy: Secondary | ICD-10-CM | POA: Diagnosis not present

## 2020-02-04 DIAGNOSIS — B351 Tinea unguium: Secondary | ICD-10-CM

## 2020-02-11 ENCOUNTER — Other Ambulatory Visit: Payer: Self-pay

## 2020-02-11 ENCOUNTER — Ambulatory Visit (INDEPENDENT_AMBULATORY_CARE_PROVIDER_SITE_OTHER): Payer: Medicare Other | Admitting: Physician Assistant

## 2020-02-11 ENCOUNTER — Encounter: Payer: Self-pay | Admitting: Physician Assistant

## 2020-02-11 VITALS — BP 150/86 | HR 63 | Temp 98.6°F | Ht 67.0 in | Wt 154.5 lb

## 2020-02-11 DIAGNOSIS — R1900 Intra-abdominal and pelvic swelling, mass and lump, unspecified site: Secondary | ICD-10-CM | POA: Diagnosis not present

## 2020-02-11 DIAGNOSIS — E119 Type 2 diabetes mellitus without complications: Secondary | ICD-10-CM | POA: Diagnosis not present

## 2020-02-11 DIAGNOSIS — R413 Other amnesia: Secondary | ICD-10-CM | POA: Diagnosis not present

## 2020-02-11 MED ORDER — CANAGLIFLOZIN 100 MG PO TABS: 100.0000 mg | ORAL_TABLET | Freq: Every day | ORAL | 1 refills | Status: DC

## 2020-02-11 NOTE — Patient Instructions (Addendum)
It was great to see you!  We will update your labs and urine.  Stop Trulicity. Start 100 mg invokana.  Weigh self weekly.  Let's follow-up in 1 month, sooner if you have concerns.  Due to recent changes in healthcare laws, you may see the results of your imaging and laboratory studies on MyChart before your provider has had a chance to review them.  We understand that in some cases there may be results that are confusing or concerning to you. Not all laboratory results come back in the same time frame and the provider may be waiting for multiple results in order to interpret others.  Please give [place] 48 hours in order for your provider to thoroughly review all the results before contacting the office for clarification of your results.   Take care,  Inda Coke PA-C

## 2020-02-11 NOTE — Progress Notes (Signed)
Catherine Mcconnell is a 74 y.o. female is here to discuss: memory issues and lump on side  I acted as a Education administrator for [employer], PA-C Anselmo Pickler, LPN   History of Present Illness:   Chief Complaint  Patient presents with  . Memory issues  . Mass   History provided by patient and daughter.  HPI   Memory Issues Pt c/o memory issues for the past month. She is having trouble with directions, gets turned around when driving. She is not mis-splacing things more than usual. She feels like her episodes are more frequently occurring right after Trulicity injections (Wednesday pm.) Doesn't usually pay bills so doesn't feel like she has issues with things like this. She did miss her cardiology appointment due to getting lost.  Sleeps 6-7 hours a night, feels like this is adequate. Uses CPAP religiously. No alcohol. Uses pill box for her medications.  Denies concerns for UTI, mismanagement of medications, insomnia.  Has had two pounds of unintentional weight loss, but patient is not concerned. Denies changes to appetite or food intake.  Wt Readings from Last 5 Encounters:  02/11/20 154 lb 8 oz (70.1 kg)  01/19/20 156 lb 3.2 oz (70.9 kg)  10/19/19 159 lb 4 oz (72.2 kg)  08/10/19 158 lb 12.8 oz (72 kg)  07/14/19 161 lb 4 oz (73.1 kg)   Body mass index is 24.2 kg/m.  Diabetes Struggling with irritation at injection sites of the Trulicity -- getting slightly raised areas that are worsening with time. Blood sugars have been in the 200's over the past few weeks.   Lump Pt c/o lump below right rib cage area, irritating when she moves. Having increase in pain low back area hip on right side. Pt is taking Tylenol arthritis and Tramadol for pain. Lump is mobile, and the size of a half dollar. Denies: known trauma.    Health Maintenance Due  Topic Date Due  . COLONOSCOPY  Never done  . PNA vac Low Risk Adult (2 of 2 - PCV13) 11/14/2019  . COVID-19 Vaccine (2 - Pfizer 2-dose series)  02/10/2020    Past Medical History:  Diagnosis Date  . Arthritis   . Chronic kidney disease   . Diabetes mellitus without complication ([place])   . Hypertension   . Iron deficiency    on oral iron supplements, has not required transfusion  . Mixed hyperlipidemia   . OSA on CPAP    compliant  . Thyroid nodule    removed     Social History   Socioeconomic History  . Marital status: Divorced    Spouse name: Not on file  . Number of children: 6  . Years of education: some college  . Highest education level: Not on file  Occupational History  . Occupation: Retired   Tobacco Use  . Smoking status: Former Research scientist (life sciences)  . Smokeless tobacco: Never Used  . Tobacco comment: only socially in high school  Substance and Sexual Activity  . Alcohol use: Not Currently  . Drug use: Never  . Sexual activity: Not on file  Other Topics Concern  . Not on file  Social History Narrative   Mother of 70   --Two children have passed, one from from prostate cancer and one from liver failure   Used to be a CNA, Child psychotherapist   Recently moved back to [facility]   Financial Resource Strain:   . Difficulty of Paying Living Expenses:   Food  Insecurity:   . Worried About Charity fundraiser in the Last Year:   . Arboriculturist in the Last Year:   Transportation Needs:   . Film/video editor (Medical):   Marland Kitchen Lack of Transportation (Non-Medical):   Physical Activity:   . Days of Exercise per Week:   . Minutes of Exercise per Session:   Stress:   . Feeling of Stress :   Social Connections:   . Frequency of Communication with Friends and Family:   . Frequency of Social Gatherings with Friends and Family:   . Attends Religious Services:   . Active Member of Clubs or Organizations:   . Attends Archivist Meetings:   Marland Kitchen Marital Status:   Intimate Partner Violence:   . Fear of Current or Ex-Partner:   . Emotionally Abused:   Marland Kitchen Physically Abused:   . Sexually Abused:       Past Surgical History:  Procedure Laterality Date  . ABDOMINAL HYSTERECTOMY  1996  . MASS EXCISION Right 2017   right arm mass removal  . THYROID SURGERY     left side removal benign nodule    Family History  Problem Relation Age of Onset  . Stroke Mother   . Hypertension Mother   . Heart disease Mother   . Heart attack Father   . Early death Father   . Hypertension Father   . Diabetes Sister   . Early death Sister   . Hypertension Sister   . Stroke Sister   . Prostate cancer Son   . Cancer Son   . Early death Son   . Breast cancer Cousin   . Diabetes Sister   . Early death Sister   . Hyperlipidemia Sister   . Kidney disease Sister   . Depression Daughter   . Hypertension Daughter   . Hyperlipidemia Daughter   . Diabetes Sister   . Hyperlipidemia Sister   . Hypertension Sister   . Early death Brother   . Arthritis Brother   . Heart attack Brother   . Heart disease Brother   . Arthritis Brother   . Diabetes Sister     PMHx, SurgHx, SocialHx, FamHx, Medications, and Allergies were reviewed in the Visit Navigator and updated as appropriate.   Patient Active Problem List   Diagnosis Date Noted  . No-show for appointment 01/27/2020  . Diabetes mellitus without complication ([place]) 123XX123  . OSA on CPAP   . Chronic kidney disease   . Hypertension   . Mixed hyperlipidemia     Social History   Tobacco Use  . Smoking status: Former Research scientist (life sciences)  . Smokeless tobacco: Never Used  . Tobacco comment: only socially in high school  Substance Use Topics  . Alcohol use: Not Currently  . Drug use: Never    Current Medications and Allergies:    Current Outpatient Medications:  .  amLODipine (NORVASC) 10 MG tablet, TAKE 1 TABLET BY MOUTH  DAILY, Disp: 90 tablet, Rfl: 3 .  aspirin EC 81 MG tablet, Take 81 mg by mouth daily., Disp: , Rfl:  .  atorvastatin (LIPITOR) 40 MG tablet, TAKE 1 TABLET BY MOUTH  DAILY, Disp: 90 tablet, Rfl: 3 .  carvedilol (COREG) 6.25 MG  tablet, TAKE 1 TABLET BY MOUTH  TWICE DAILY WITH MEALS, Disp: 180 tablet, Rfl: 3 .  Dulaglutide (TRULICITY) 1.5 0000000 SOPN, Inject 1.5 mg into the skin once a week., Disp: 4 pen, Rfl: 2 .  ferrous sulfate (IRON  SUPPLEMENT) 325 (65 FE) MG tablet, Take 1 tablet (325 mg total) by mouth 2 (two) times daily with a meal. (Patient taking differently: Take 325 mg by mouth daily. ), Disp: 60 tablet, Rfl: 2 .  gabapentin (NEURONTIN) 100 MG capsule, TAKE 3 CAPSULES BY MOUTH  DAILY, Disp: 270 capsule, Rfl: 1 .  glucose blood (ONE TOUCH ULTRA TEST) test strip, Use to test blood sugars 2 times daily., Disp: 100 each, Rfl: 4 .  Lancets (ONETOUCH ULTRASOFT) lancets, CHECK TWICE PER DAY BEFORE  BREAKFAST AND DINNER, Disp: 200 each, Rfl: 4 .  losartan (COZAAR) 25 MG tablet, TAKE 1 TABLET BY MOUTH  DAILY, Disp: 90 tablet, Rfl: 3 .  LUMIGAN 0.01 % SOLN, Place 1 drop into both eyes every evening., Disp: , Rfl:  .  traMADol (ULTRAM) 50 MG tablet, Take 1 tablet (50 mg total) by mouth every 12 (twelve) hours as needed., Disp: 30 tablet, Rfl: 1   Allergies  Allergen Reactions  . Lisinopril Rash  . Metformin And Related Swelling    Review of Systems   ROS  Negative unless otherwise specified per HPI.  Vitals:   Vitals:   02/11/20 1434  BP: (!) 150/86  Pulse: 63  Temp: 98.6 F (37 C)  TempSrc: Temporal  SpO2: 97%  Weight: 154 lb 8 oz (70.1 kg)  Height: 5\' 7"  (1.702 m)     Body mass index is 24.2 kg/m.   Physical Exam:    Physical Exam Vitals and nursing note reviewed.  Constitutional:      General: She is not in acute distress.    Appearance: She is well-developed. She is not ill-appearing or toxic-appearing.  Cardiovascular:     Rate and Rhythm: Normal rate and regular rhythm.     Pulses: Normal pulses.     Heart sounds: Normal heart sounds, S1 normal and S2 normal.     Comments: No LE edema Pulmonary:     Effort: Pulmonary effort is normal.     Breath sounds: Normal breath sounds.    Musculoskeletal:     Comments: No palpable abnormality to area of concern on R lower rib cage  Skin:    General: Skin is warm and dry.  Neurological:     Mental Status: She is alert.     GCS: GCS eye subscore is 4. GCS verbal subscore is 5. GCS motor subscore is 6.     Cranial Nerves: Cranial nerves are intact.     Sensory: Sensation is intact.     Motor: Motor function is intact.     Coordination: Coordination is intact.  Psychiatric:        Speech: Speech normal.        Behavior: Behavior normal. Behavior is cooperative.    MMSE - Mini Mental State Exam 02/11/2020 02/11/2020  Orientation to time 5 5  Orientation to Place 5 5  Attention/ Calculation 5 5  Recall - 1  Language- follow 3 step command 3 -  Write a sentence 1 1     Assessment and Plan:    Jaala was seen today for memory issues and mass.  Diagnoses and all orders for this visit:  Memory loss No red flags on exam. Will update labs and check urine today. Will stop Trulicity due to concerns for this causing worsening memory, but also due to injection site issues. Follow-up in 1 month, sooner if concerns. Consider referral to neurology for further evaluation and mgmt. Patient and daughter in agreement. -  CBC with Differential/Platelet -     Comprehensive metabolic panel -     TSH -     Vitamin B12 -     Urine Culture -     Urinalysis, Routine w reflex microscopic  Diabetes mellitus without complication ([facility]) Stop Trulicity 2/2 injection site irritation. Start on 100 mg invokana. Samples provided in office and rx sent. Follow-up in 1 month, sooner if concerns.  Abdominal mass, unspecified abdominal location No palpable mass today. Recommend watchful waiting and if returns or new symptoms develop, will likely image. Patient is in agreement.  . Reviewed expectations re: course of current medical issues. . Discussed self-management of symptoms. . Outlined signs and symptoms indicating need for more acute  intervention. . Patient verbalized understanding and all questions were answered. . See orders for this visit as documented in the electronic medical record. . Patient received an After Visit Summary.  CMA or LPN served as scribe during this visit. History, Physical, and Plan performed by medical provider. The above documentation has been reviewed and is accurate and complete.   Inda Coke, PA-C [place] 02/11/2020  Follow-up: No follow-ups on file.

## 2020-02-12 LAB — URINALYSIS, ROUTINE W REFLEX MICROSCOPIC
Bacteria, UA: NONE SEEN /HPF
Bilirubin Urine: NEGATIVE
Hgb urine dipstick: NEGATIVE
Hyaline Cast: NONE SEEN /LPF
Ketones, ur: NEGATIVE
Leukocytes,Ua: NEGATIVE
Nitrite: NEGATIVE
Specific Gravity, Urine: 1.015 (ref 1.001–1.03)
Squamous Epithelial / HPF: NONE SEEN /HPF (ref <=5)
WBC, UA: NONE SEEN /HPF (ref 0–5)
pH: 7 (ref 5.0–8.0)

## 2020-02-12 LAB — COMPREHENSIVE METABOLIC PANEL
AG Ratio: 1.6 (calc) (ref 1.0–2.5)
ALT: 28 U/L (ref 6–29)
AST: 36 U/L — ABNORMAL HIGH (ref 10–35)
Albumin: 4.1 g/dL (ref 3.6–5.1)
Alkaline phosphatase (APISO): 55 U/L (ref 37–153)
BUN/Creatinine Ratio: 11 (calc) (ref 6–22)
BUN: 16 mg/dL (ref 7–25)
CO2: 26 mmol/L (ref 20–32)
Calcium: 9.6 mg/dL (ref 8.6–10.4)
Chloride: 99 mmol/L (ref 98–110)
Creat: 1.49 mg/dL — ABNORMAL HIGH (ref 0.60–0.93)
Globulin: 2.5 g/dL (calc) (ref 1.9–3.7)
Glucose, Bld: 152 mg/dL — ABNORMAL HIGH (ref 65–99)
Potassium: 4 mmol/L (ref 3.5–5.3)
Sodium: 136 mmol/L (ref 135–146)
Total Bilirubin: 0.6 mg/dL (ref 0.2–1.2)
Total Protein: 6.6 g/dL (ref 6.1–8.1)

## 2020-02-12 LAB — CBC WITH DIFFERENTIAL/PLATELET
Absolute Monocytes: 289 cells/uL (ref 200–950)
Basophils Absolute: 31 cells/uL (ref 0–200)
Basophils Relative: 0.8 %
Eosinophils Absolute: 140 cells/uL (ref 15–500)
Eosinophils Relative: 3.6 %
HCT: 36.5 % (ref 35.0–45.0)
Hemoglobin: 12.2 g/dL (ref 11.7–15.5)
Lymphs Abs: 1447 cells/uL (ref 850–3900)
MCH: 30.1 pg (ref 27.0–33.0)
MCHC: 33.4 g/dL (ref 32.0–36.0)
MCV: 90.1 fL (ref 80.0–100.0)
MPV: 11.2 fL (ref 7.5–12.5)
Monocytes Relative: 7.4 %
Neutro Abs: 1993 cells/uL (ref 1500–7800)
Neutrophils Relative %: 51.1 %
Platelets: 187 10*3/uL (ref 140–400)
RBC: 4.05 10*6/uL (ref 3.80–5.10)
RDW: 12.3 % (ref 11.0–15.0)
Total Lymphocyte: 37.1 %
WBC: 3.9 10*3/uL (ref 3.8–10.8)

## 2020-02-12 LAB — VITAMIN B12: Vitamin B-12: 495 pg/mL (ref 200–1100)

## 2020-02-12 LAB — URINE CULTURE
MICRO NUMBER:: 10425940
Result:: NO GROWTH
SPECIMEN QUALITY:: ADEQUATE

## 2020-02-12 LAB — TSH: TSH: 1.72 mIU/L (ref 0.40–4.50)

## 2020-02-14 ENCOUNTER — Ambulatory Visit: Payer: Medicare Other | Attending: Internal Medicine

## 2020-02-14 ENCOUNTER — Other Ambulatory Visit: Payer: Self-pay | Admitting: Physician Assistant

## 2020-02-14 DIAGNOSIS — Z23 Encounter for immunization: Secondary | ICD-10-CM

## 2020-02-14 MED ORDER — SITAGLIPTIN PHOSPHATE 50 MG PO TABS
50.0000 mg | ORAL_TABLET | Freq: Every day | ORAL | 0 refills | Status: DC
Start: 2020-02-14 — End: 2020-04-18

## 2020-02-14 NOTE — Progress Notes (Signed)
   Covid-19 Vaccination Clinic  Name:  Catherine Mcconnell    MRN: [id number] DOB: 20-Oct-1945  02/14/2020  [name] was observed post Covid-19 immunization for 15 minutes without incident. She was provided with Vaccine Information Sheet and instruction to access the V-Safe system.   [name] was instructed to call 911 with any severe reactions post vaccine: Marland Kitchen Difficulty breathing  . Swelling of face and throat  . A fast heartbeat  . A bad rash all over body  . Dizziness and weakness   Immunizations Administered    Name Date Dose VIS Date Route   Pfizer COVID-19 Vaccine 02/14/2020 10:10 AM 0.3 mL 12/08/2018 Intramuscular   Manufacturer: [place]   Lot: [id number]   [place]: [id number]

## 2020-02-28 ENCOUNTER — Other Ambulatory Visit: Payer: Self-pay | Admitting: Physician Assistant

## 2020-02-28 DIAGNOSIS — R413 Other amnesia: Secondary | ICD-10-CM

## 2020-03-03 ENCOUNTER — Ambulatory Visit (INDEPENDENT_AMBULATORY_CARE_PROVIDER_SITE_OTHER): Payer: Medicare Other | Admitting: Physician Assistant

## 2020-03-03 ENCOUNTER — Other Ambulatory Visit: Payer: Self-pay

## 2020-03-03 ENCOUNTER — Encounter: Payer: Self-pay | Admitting: Physician Assistant

## 2020-03-03 VITALS — BP 150/66 | HR 69 | Temp 98.1°F | Ht 67.0 in | Wt 156.0 lb

## 2020-03-03 DIAGNOSIS — E119 Type 2 diabetes mellitus without complications: Secondary | ICD-10-CM

## 2020-03-03 DIAGNOSIS — R413 Other amnesia: Secondary | ICD-10-CM

## 2020-03-03 MED ORDER — RYBELSUS 3 MG PO TABS: 3.0000 mg | ORAL_TABLET | Freq: Every day | ORAL | 1 refills | Status: DC

## 2020-03-03 NOTE — Progress Notes (Signed)
Catherine Mcconnell is a 74 y.o. female is here to for follow up.  I acted as a Education administrator for [employer], PA-C Anselmo Pickler, LPN   History of Present Illness:   Chief Complaint  Patient presents with  . Diabetes  . Memory Loss    HPI   Diabetes Pt following up, was started on Invokana and had to stop due to insurance would not cover medication. Pt was started on Januvia 50 mg on 02/16/2020. Pt tolerating well but is concerned because she is still having some issues with elevated blood sugars -- fasting blood sugars running 143-200. Denies hypoglycemia, no blurred or dizziness.  Lab Results  Component Value Date   HGBA1C 8.4 (A) 01/19/2020    Memory loss Pt says has noticed an improvement since stopping Trulicity. She is able to do things without thinking about it now. She did forget to pay her phone bill at the beginning of May. Hasn't had to drive anywhere recently so she is unable to comment on whether or not she has had issues with remembering how to navigate common streets. She has an appointment with a neurologist in a few weeks.  Health Maintenance Due  Topic Date Due  . COLONOSCOPY  Never done  . PNA vac Low Risk Adult (2 of 2 - PCV13) 11/14/2019    Past Medical History:  Diagnosis Date  . Arthritis   . Chronic kidney disease   . Diabetes mellitus without complication ([place])   . Hypertension   . Iron deficiency    on oral iron supplements, has not required transfusion  . Mixed hyperlipidemia   . OSA on CPAP    compliant  . Thyroid nodule    removed     Social History   Tobacco Use  . Smoking status: Former Research scientist (life sciences)  . Smokeless tobacco: Never Used  . Tobacco comment: only socially in high school  Substance Use Topics  . Alcohol use: Not Currently  . Drug use: Never    Past Surgical History:  Procedure Laterality Date  . ABDOMINAL HYSTERECTOMY  1996  . MASS EXCISION Right 2017   right arm mass removal  . THYROID SURGERY     left side removal  benign nodule    Family History  Problem Relation Age of Onset  . Stroke Mother   . Hypertension Mother   . Heart disease Mother   . Heart attack Father   . Early death Father   . Hypertension Father   . Diabetes Sister   . Early death Sister   . Hypertension Sister   . Stroke Sister   . Prostate cancer Son   . Cancer Son   . Early death Son   . Breast cancer Cousin   . Diabetes Sister   . Early death Sister   . Hyperlipidemia Sister   . Kidney disease Sister   . Depression Daughter   . Hypertension Daughter   . Hyperlipidemia Daughter   . Diabetes Sister   . Hyperlipidemia Sister   . Hypertension Sister   . Early death Brother   . Arthritis Brother   . Heart attack Brother   . Heart disease Brother   . Arthritis Brother   . Diabetes Sister     PMHx, SurgHx, SocialHx, FamHx, Medications, and Allergies were reviewed in the Visit Navigator and updated as appropriate.   Patient Active Problem List   Diagnosis Date Noted  . Diabetes mellitus without complication ([place]) 123XX123  . OSA on  CPAP   . Chronic kidney disease   . Hypertension   . Mixed hyperlipidemia     Social History   Tobacco Use  . Smoking status: Former Research scientist (life sciences)  . Smokeless tobacco: Never Used  . Tobacco comment: only socially in high school  Substance Use Topics  . Alcohol use: Not Currently  . Drug use: Never    Current Medications and Allergies:    Current Outpatient Medications:  .  amLODipine (NORVASC) 10 MG tablet, TAKE 1 TABLET BY MOUTH  DAILY, Disp: 90 tablet, Rfl: 3 .  aspirin EC 81 MG tablet, Take 81 mg by mouth daily., Disp: , Rfl:  .  atorvastatin (LIPITOR) 40 MG tablet, TAKE 1 TABLET BY MOUTH  DAILY, Disp: 90 tablet, Rfl: 3 .  carvedilol (COREG) 6.25 MG tablet, TAKE 1 TABLET BY MOUTH  TWICE DAILY WITH MEALS, Disp: 180 tablet, Rfl: 3 .  ferrous sulfate (IRON SUPPLEMENT) 325 (65 FE) MG tablet, Take 1 tablet (325 mg total) by mouth 2 (two) times daily with a meal. (Patient taking  differently: Take 325 mg by mouth daily. ), Disp: 60 tablet, Rfl: 2 .  gabapentin (NEURONTIN) 100 MG capsule, TAKE 3 CAPSULES BY MOUTH  DAILY, Disp: 270 capsule, Rfl: 1 .  glucose blood (ONE TOUCH ULTRA TEST) test strip, Use to test blood sugars 2 times daily., Disp: 100 each, Rfl: 4 .  Lancets (ONETOUCH ULTRASOFT) lancets, CHECK TWICE PER DAY BEFORE  BREAKFAST AND DINNER, Disp: 200 each, Rfl: 4 .  losartan (COZAAR) 25 MG tablet, TAKE 1 TABLET BY MOUTH  DAILY, Disp: 90 tablet, Rfl: 3 .  LUMIGAN 0.01 % SOLN, Place 1 drop into both eyes every evening., Disp: , Rfl:  .  sitaGLIPtin (JANUVIA) 50 MG tablet, Take 1 tablet (50 mg total) by mouth daily., Disp: 90 tablet, Rfl: 0 .  traMADol (ULTRAM) 50 MG tablet, Take 1 tablet (50 mg total) by mouth every 12 (twelve) hours as needed., Disp: 30 tablet, Rfl: 1 .  Semaglutide (RYBELSUS) 3 MG TABS, Take 3 mg by mouth daily., Disp: 30 tablet, Rfl: 1   Allergies  Allergen Reactions  . Lisinopril Rash  . Metformin And Related Swelling    Review of Systems   ROS Negative unless otherwise specified per HPI.  Vitals:   Vitals:   03/03/20 1127  BP: (!) 150/66  Pulse: 69  Temp: 98.1 F (36.7 C)  TempSrc: Temporal  SpO2: 97%  Weight: 156 lb (70.8 kg)  Height: 5\' 7"  (1.702 m)     Body mass index is 24.43 kg/m.   Physical Exam:    Physical Exam Vitals and nursing note reviewed.  Constitutional:      General: She is not in acute distress.    Appearance: She is well-developed. She is not ill-appearing or toxic-appearing.  Cardiovascular:     Rate and Rhythm: Normal rate and regular rhythm.     Pulses: Normal pulses.     Heart sounds: Normal heart sounds, S1 normal and S2 normal.     Comments: No LE edema Pulmonary:     Effort: Pulmonary effort is normal.     Breath sounds: Normal breath sounds.  Skin:    General: Skin is warm and dry.  Neurological:     Mental Status: She is alert.     GCS: GCS eye subscore is 4. GCS verbal subscore  is 5. GCS motor subscore is 6.  Psychiatric:        Speech: Speech normal.  Behavior: Behavior normal. Behavior is cooperative.      Assessment and Plan:    Catherine Mcconnell was seen today for diabetes and memory loss.  Diagnoses and all orders for this visit:  Diabetes mellitus without complication ([place]) Will trial adding in 3 mg rybelsus daily if insurance allows for this. Continue Januvia 50 mg daily. Follow-up when due for HgbA1c -- already scheduled, sooner if concerns.  Memory loss Patient and daughter endorse improvement since stopping Trulicity, however I recommended that she continue to proceed with appointment with Neurology. She is agreeable to this.  Other orders -     Semaglutide (RYBELSUS) 3 MG TABS; Take 3 mg by mouth daily.  . Reviewed expectations re: course of current medical issues. . Discussed self-management of symptoms. . Outlined signs and symptoms indicating need for more acute intervention. . Patient verbalized understanding and all questions were answered. . See orders for this visit as documented in the electronic medical record. . Patient received an After Visit Summary.  CMA or LPN served as scribe during this visit. History, Physical, and Plan performed by medical provider. The above documentation has been reviewed and is accurate and complete.  Inda Coke, PA-C [place] 03/03/2020  Follow-up: No follow-ups on file.

## 2020-03-03 NOTE — Patient Instructions (Signed)
It was great to see you!  We will try to start Rybelsus 3 mg daily if insurance allows.  Take care,  Inda Coke PA-C

## 2020-03-06 ENCOUNTER — Other Ambulatory Visit: Payer: Self-pay | Admitting: Physician Assistant

## 2020-03-08 ENCOUNTER — Ambulatory Visit: Payer: Medicare Other | Admitting: Cardiology

## 2020-03-14 ENCOUNTER — Telehealth: Payer: Self-pay | Admitting: Physician Assistant

## 2020-03-14 NOTE — Telephone Encounter (Signed)
Patient is calling in this afternoon stating she received the Semaglutide (RYBELSUS) 3 MG TABS so they are wanting to know how often they take it and if they need to stop taking the sitaGLIPtin (JANUVIA) 50 MG tablet

## 2020-03-14 NOTE — Telephone Encounter (Signed)
Spoke to pt's daughter Mrs. Isler, told her for pt to take Rybelsus 3 mg tablet once a day and to continue Januvia as prescribed. Mrs. Isler verbalized understanding.

## 2020-03-16 ENCOUNTER — Other Ambulatory Visit: Payer: Self-pay

## 2020-03-16 ENCOUNTER — Ambulatory Visit: Payer: Medicare Other | Admitting: Neurology

## 2020-03-16 ENCOUNTER — Encounter: Payer: Self-pay | Admitting: Neurology

## 2020-03-16 ENCOUNTER — Telehealth: Payer: Self-pay | Admitting: Neurology

## 2020-03-16 VITALS — BP 126/82 | HR 64 | Resp 20 | Ht 67.0 in | Wt 159.5 lb

## 2020-03-16 DIAGNOSIS — R413 Other amnesia: Secondary | ICD-10-CM | POA: Diagnosis not present

## 2020-03-16 DIAGNOSIS — G4733 Obstructive sleep apnea (adult) (pediatric): Secondary | ICD-10-CM

## 2020-03-16 DIAGNOSIS — E782 Mixed hyperlipidemia: Secondary | ICD-10-CM

## 2020-03-16 DIAGNOSIS — E119 Type 2 diabetes mellitus without complications: Secondary | ICD-10-CM | POA: Diagnosis not present

## 2020-03-16 DIAGNOSIS — N182 Chronic kidney disease, stage 2 (mild): Secondary | ICD-10-CM

## 2020-03-16 DIAGNOSIS — I10 Essential (primary) hypertension: Secondary | ICD-10-CM

## 2020-03-16 DIAGNOSIS — Z9989 Dependence on other enabling machines and devices: Secondary | ICD-10-CM

## 2020-03-16 NOTE — Patient Instructions (Signed)

## 2020-03-16 NOTE — Progress Notes (Signed)
SLEEP MEDICINE CLINIC   Provider:  Larey Seat, M.D.   Primary Care Physician / Referring Provider: [facility]  Chief Complaint  Patient presents with  . Memory Loss    rm 10, New Problem,  dgtr- Catherine Mcconnell  MOCA 24    New problem for this established sleep patient, last seen by NP- Televisit.  Catherine Mcconnell is a 74 year- old african -[place] . female is seen on 03-16-2020 with her daughter. According to her daughter Catherine Mcconnell has forgotten to pay her phone bill which has never happened before, she has gotten lost driving in town on familiar roads.  There were no aggravating factors such as bad weather or nighttime driving.  These events have left Catherine Mcconnell a little bit shaken. Based on these concerns her physician assistant [name] has sent the patient for follow-up.    The memory loss is accompanied by a rather poorly controlled diabetes still the HbA1c was 8.4 in April of this year, she has a past medical history of chronic kidney disease which is not graded, arthritis, heart iron deficiency, hypertension, mixed hyperlipidemia and she has a history of a thyroid nodule which was surgically removed. She has a past surgical history of has to hysterectomy in 1996 thyroid surgery a biopsy revealed a benign nodule.  And she had a skin mass removed from her right upper arm in 2017 which  was not cancerous.   She is followed here in our sleep clinic for OSA obstructive sleep apnea on CPAP.  Just to stay for another visit she has been 100% compliant by days, 97% compliant by time average use at time of 6 hours 51 minutes, CPAP is set to 11 cm water pressure with 3 cm EPR her residual apnea-hypopnea index is 2.4/h very good she does have some central apneas arising but overall the main problem may be an air leak but not a lack of control of apnea.  95th percentile air leak was 55 L/min which means that the mask gets dislodged sometimes.  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      History of Present Illness:  Catherine Mcconnell is a 74 y.o. female who has been followed in this office for OSA on CPAP. She was initially scheduled for face-to-face office follow up visit today time but due to [place], visit rescheduled for non-face-to-face telephone visit with patients consent. Unable to participate in video visit due to lack of access to device with camera.    Today 03/29/19 Catherine Mcconnell is a 74 year old female with a history of obstructive sleep apnea.  Her download shows that she use her machine 30 out of 30 days for compliance of 100%.  She use her machine greater than 4 hours each night.  On average she uses her machine 8 hours and 22 minutes.  Her residual AHI is 1.8 on 11 cm of water with EPR of 3.  She has a leak in the 95th percentile at 35.2 L/min.  She states that she sometimes feels the mask leaking at night.  She states that she does have an extra small mask and she may try that as it may get bette       Catherine Mcconnell is a 74 y.o. female , seen here as in a referral for transfer  of care- The patient has just moved form [place], and has seen Dr. [name] at [facility]. She has not been established with a PCP.   I have the pleasure of meeting Mrs. Catherine Mcconnell today on 23 March 2018.  She moved to [place] several months ago from.  She has a history of hypertension, diabetes mellitus, hyperlipidemia, chronic kidney disease, heart murmur and was evaluated in [facility] [place].  She has an excellent functional status she can walk about a mile without any limitation no shortness of breath is recorded.  Her EKG done at her last primary care physician's office showed left ventricular hypertrophy with normal sinus rhythm.  The patient has  no symptoms attributable to valvular heart disease.  An echocardiogram on 01 February 2017 showed normal left ventricular ejection fraction of 60%.  Normal left atrial pressure, minimal tricuspid regurgitation and pulmonary regurgitation, grade 1 diastolic dysfunction.  No functional limitations.  EKG in sinus rhythm as of 09 January 2017.  [facility] also ordered a sleep study on 21 Feb 2017 , resulting in an AHI of 57.3/h with O2 desaturation to a nadir of 81% during non-REM sleep.  CPAP therapy and weight reduction were recommended.  Her cardiologist was Dr. Manuella Ghazi, MD, her primary care physician was [name] , MD.  One of Dr. Trena Platt last office notes from May 20, 2017 stated that the patient's essential primary hypertension -was controlled on Norvasc 10 mg daily and Coreg 6.25 mg twice daily, type 2 diabetes was managed by primary care, hyperlipidemia treated with Lipitor 40 mg at night, CKD with a creatinine of 1.62 is followed now by nephrologist Dr. [name], cardiac monitor was unspecified the patient was considered not a good candidate for dehydration or diuretics.   About a year ago she had a spell of dehydration and had to go to her [facility] emergency room where she was IV hydrated.    Obstructive sleep apnea was treated with CPAP at 11 cmH2O pressure.  I told hear from her last sleep study from 12 Mar 2017 the patient's respirations stabilized on the optimal nasal CPAP pressure of 11 cmH2O.   The AHI was reduced to 0.0/h the EKG showed sinus rhythm with a variable heart rate between 44 bpm and 71 bpm snoring was eliminated with nasal CPAP under a DreamWear mask.  No significant periodic leg movement, REM latency was 88 minutes.  She uses medium small cushions no chinstrap and heated humidity.  Interval history the patient had very good compliance data in [facility] and we were able to also interrogate her machine by Wi-Fi.  Over the 90 day period from September through  November 2018 she has used the machine 100% of days, with an average user time of 8 hours and 7 minutes, CPAP is set at 11 cmH2O pressure with 3 cm expiratory pressure relief - her residual AHI in November was 1.1.    Today's compliance record again is 100% average use of time 7 hours 48 minutes, CPAP still set at 11 cmH2O pressure with 3 cm EPR and the residual AHI is 1.7 there were no Cheyne-Stokes respiration arising under therapy.  Air leak varies night by night, her residual apnea is considered low enough to be well controlled.  She continues to feel comfortable with a DreamWear nasal pillow mask.  Chief complaint according to patient :" I need new supplies."   Sleep habits are  as follows: She goes to bed around 11 Pm, usually reading for 30-40 minutes before falling asleep, the bedroom is cool, quiet and dark. The patient sleeps on her side- but moves a lot during sleep. She sleeps on 2 pillows. She sleeps alone. 2-3 nocturias each night. She rises between 7-9 AM, mostly around 8 AM.  She feel rested in AM, no headaches, no palpitations, no lightheadedness were reported..    Medical history and family sleep history: HTN and DM 2 were diagnosed 20 years ago, she was of the same weight as now. No tonsillectomy, no nasal septal deviation.Father was a heavy snorer.    Social history:  She is close to her daughter, high protein ow carb diet just implemented. divorced, 6 biological children, 2 son have passed away , middle son in Jan 31, 2008 following a liver transplant, the eldest in 42 with pancreatic cancer, both were veterans. Non smoker- seldomly drinking alcohol, Caffeine - consumes iced tea and coffee, not sodas. 1 cup in AM, 1 glass of tea with lunch and dinner.  Worked as Training and development officer .     Review of Systems: Out of a complete 14 system review, the patient complains of only the following symptoms, and all other reviewed systems are negative.   Catherine Mcconnell reports swelling in her legs,  snoring which may have been alleviated on the CPAP, anemia, joint pain, birthmarks, nocturia, changes in appetite and restless legs.  Epworth Sleepiness score 11/ 24 - but takes no naps.  Fatigue severity score 32  , depression score 6/15 , she claims pandemic related.    Social History   Socioeconomic History  . Marital status: Divorced    Spouse name: Not on file  . Number of children: 6  . Years of education: some college  . Highest education level: Not on file  Occupational History  . Occupation: Retired   Tobacco Use  . Smoking status: Former Research scientist (life sciences)  . Smokeless tobacco: Never Used  . Tobacco comment: only socially in high school  Substance and Sexual Activity  . Alcohol use: Not Currently  . Drug use: Never  . Sexual activity: Not on file  Other Topics Concern  . Not on file  Social History Narrative   03/16/20 lives with dgtr Weber Cooks   Mother of 6   --Two children have passed, one from from prostate cancer and one from liver failure   Used to be a CNA, Child psychotherapist   Recently moved back to [facility]   Financial Resource Strain:   . Difficulty of Paying Living Expenses:   Food Insecurity:   . Worried About Charity fundraiser in the Last Year:   . Arboriculturist in the Last Year:   Transportation Needs:   . Film/video editor (Medical):   Marland Kitchen Lack of Transportation (Non-Medical):   Physical Activity:   . Days of Exercise per Week:   . Minutes of Exercise per Session:   Stress:   . Feeling of Stress :   Social Connections:   . Frequency of Communication with Friends and Family:   . Frequency of Social Gatherings with Friends and Family:   . Attends Religious Services:   . Active Member of Clubs or Organizations:   . Attends Archivist Meetings:   Marland Kitchen Marital Status:   Intimate Partner Violence:   . Fear of Current or Ex-Partner:   . Emotionally Abused:   Marland Kitchen Physically Abused:   .  Sexually Abused:     Family History   Problem Relation Age of Onset  . Stroke Mother   . Hypertension Mother   . Heart disease Mother   . Heart attack Father   . Early death Father   . Hypertension Father   . Diabetes Sister   . Early death Sister   . Hypertension Sister   . Stroke Sister   . Prostate cancer Son   . Cancer Son   . Early death Son   . Breast cancer Cousin   . Diabetes Sister   . Early death Sister   . Hyperlipidemia Sister   . Kidney disease Sister   . Depression Daughter   . Hypertension Daughter   . Hyperlipidemia Daughter   . Diabetes Sister   . Hyperlipidemia Sister   . Hypertension Sister   . Early death Brother   . Arthritis Brother   . Heart attack Brother   . Heart disease Brother   . Arthritis Brother   . Diabetes Sister     Past Medical History:  Diagnosis Date  . Arthritis   . Chronic kidney disease   . Diabetes mellitus without complication ([place])   . Hypertension   . Iron deficiency    on oral iron supplements, has not required transfusion  . Mixed hyperlipidemia   . OSA on CPAP    compliant  . Thyroid nodule    removed      Current Outpatient Medications  Medication Sig Dispense Refill  . amLODipine (NORVASC) 10 MG tablet TAKE 1 TABLET BY MOUTH  DAILY 90 tablet 3  . aspirin EC 81 MG tablet Take 81 mg by mouth daily.    Marland Kitchen atorvastatin (LIPITOR) 40 MG tablet TAKE 1 TABLET BY MOUTH  DAILY 90 tablet 3  . carvedilol (COREG) 6.25 MG tablet TAKE 1 TABLET BY MOUTH  TWICE DAILY WITH MEALS 180 tablet 3  . ferrous sulfate (IRON SUPPLEMENT) 325 (65 FE) MG tablet Take 1 tablet (325 mg total) by mouth 2 (two) times daily with a meal. (Patient taking differently: Take 325 mg by mouth daily. ) 60 tablet 2  . gabapentin (NEURONTIN) 100 MG capsule TAKE 3 CAPSULES BY MOUTH  DAILY 270 capsule 1  . Lancets (ONETOUCH ULTRASOFT) lancets CHECK TWICE PER DAY BEFORE  BREAKFAST AND DINNER 200 each 4  . losartan (COZAAR) 25 MG tablet TAKE 1 TABLET BY MOUTH  DAILY 90 tablet 3  . LUMIGAN 0.01  % SOLN Place 1 drop into both eyes every evening.    Glory Rosebush ULTRA test strip USE TO TEST BLOOD SUGARS  TWICE DAILY 200 strip 3  . Semaglutide (RYBELSUS) 3 MG TABS Take 3 mg by mouth daily. 30 tablet 1  . sitaGLIPtin (JANUVIA) 50 MG tablet Take 1 tablet (50 mg total) by mouth daily. 90 tablet 0  . traMADol (ULTRAM) 50 MG tablet Take 1 tablet (50 mg total) by mouth every 12 (twelve) hours as needed. 30 tablet 1   No current facility-administered medications for this visit.    Allergies as of 03/16/2020 - Review Complete 03/16/2020  Allergen Reaction Noted  . Lisinopril Rash 03/23/2018  . Metformin and related Swelling 03/23/2018    Vitals: Ht 5\' 7"  (1.702 m)   Wt 159 lb 8 oz (72.3 kg)   BMI 24.98 kg/m  Last Weight:  Wt Readings from Last 1 Encounters:  03/16/20 159 lb 8 oz (72.3 kg)   [id number] mass index is 24.98 kg/m.  Last Height:   Ht Readings from Last 1 Encounters:  03/16/20 5\' 7"  (1.702 m)    Physical exam:  General: The patient is awake, alert and appears not in acute distress. The patient is well groomed. Head: Normocephalic, atraumatic. Neck is supple. Mallampati 4, full plate dentures.  neck circumference 14 . Nasal airflow patent . Retrognathia is grade 2 .  Cardiovascular:  Regular rate and rhythm , without  murmurs or carotid bruit, and without distended neck veins. Respiratory: Lungs are clear to auscultation. Skin:  Without evidence of edema, or rash Trunk: BMI is now 24 .8 from 26 kg/ m2 . The patient's posture is erect.   Neurologic exam : The patient is awake and alert, oriented to place and time.   Memory subjective  described as intact by her, but her daughter disagrees.  Montreal Cognitive Assessment  03/16/2020  Visuospatial/ Executive (0/5) 4  Naming (0/3) 3  Attention: Read list of digits (0/2) 1  Attention: Read list of letters (0/1) 1  Attention: Serial 7 subtraction starting at 100 (0/3) 3  Language: Repeat phrase (0/2) 2  Language :  Fluency (0/1) 1  Abstraction (0/2) 2  Delayed Recall (0/5) 1  Orientation (0/6) 6  Total 24   .  Attention span & concentration ability appears normal.  Speech is fluent,  without dysarthria, mild dysphonia.  Mood and affect are appropriate.  Cranial nerves: no loss of smell or taste.  Pupils are equal and briskly reactive to light. Funduscopic exam deferred.   Extraocular movements in vertical and horizontal planes intact and still without nystagmus. Visual fields by finger perimetry are intact. Hearing to finger rub intact.   Facial sensation intact to fine touch.  Facial motor strength is symmetric and tongue and uvula move midline. Shoulder shrug was symmetrical.   Motor exam:  Normal tone, muscle bulk and symmetric strength in all extremities. Sensory:  Fine touch, pinprick and vibration were tested in all extremities. Proprioception tested in the upper extremities was normal. Coordination: Rapid alternating movements in the fingers/hands was normal. Finger-to-nose maneuver  normal without evidence of ataxia, dysmetria or tremor. Gait and station: Patient walks without assistive device.  Deep tendon reflexes: in the  upper and lower extremities are symmetric and intact.   Assessment:  After physical and neurologic examination, review of laboratory studies,  Personal review of imaging studies, reports of other /same  Imaging studies, results of polysomnography and / or neurophysiology testing and pre-existing records as far as provided in visit., my assessment is:    1) Transfer of CPAP care- will order the named supplies.  This patient is a CPAP user for 13 month and has been highly compliant, feels better on CPAP, sleeps deeper.   2) EDS- she has still some residual excessive sleepiness while on CPAP . May be related to CKD, DM, HTN , anemia and related medications.   The patient was advised of the nature of the diagnosed disorder , the treatment options and the  risks for  general health and wellness arising from not treating the condition.   I spent more than 35  minutes of face to face time with the patient.  Greater than 50% of time was spent in counseling and coordination of care. We have discussed the diagnosis and differential and I answered the patient's questions.    Ref Range & Units 1 mo ago  (02/11/20) 1 mo ago  (01/19/20)  Glucose, Bld 65 - 99 mg/dL 152High   183High  R   Comment: .       Fasting reference interval  .  For someone without known diabetes, a glucose  value >125 mg/dL indicates that they may have  diabetes and this should be confirmed with a  follow-up test.  .   BUN 7 - 25 mg/dL 16  19 R   Creat 0.60 - 0.93 mg/dL 1.49High   1.47High  R   Comment: For patients >78 years of age, the reference limit  for Creatinine is approximately 13% higher for people  identified as African-American.  .   BUN/Creatinine Ratio 6 - 22 (calc) 11    Sodium 135 - 146 mmol/L 136  138 R   Potassium 3.5 - 5.3 mmol/L 4.0  4.1 R   Chloride 98 - 110 mmol/L 99  102 R   CO2 20 - 32 mmol/L 26  32 R   Calcium 8.6 - 10.4 mg/dL 9.6  9.3 R   Total Protein 6.1 - 8.1 g/dL 6.6    Albumin 3.6 - 5.1 g/dL 4.1    Globulin 1.9 - 3.7 g/dL (calc) 2.5    AG Ratio 1.0 - 2.5 (calc) 1.6    Total Bilirubin 0.2 - 1.2 mg/dL 0.6    Alkaline phosphatase (APISO) 37 - 153 U/L 55    AST 10 - 35 U/L 36High     ALT 6 - 29 U/L 28    Resulting Agency  [facility]      Specimen Collected: 02/11/20 15:12 Last Resulted: 02/12/20 20:30         36 minutes.  Plan:  Treatment plan and additional workup :   1) Reni P. Mcconnell presents with a slow increase in memory lapses, desire at this time more or less anecdotes that do not happen every day but they have been significant enough to see the difference since she had never forgotten before to pay an electricity or phone bill and she is not used to getting lost.  Her Montreal cognitive assessment scores a 24 out of  30 points which is a mild cognitive impairment.  I have no doubt that she would score much higher in the Mini-Mental Status Examination which demands less of the patient.  However I would like to repeat the [place] with any future visits if her score drops under 23 we will go to a Mini-Mental status exam instead.  I have asked her to try the best to control her diabetes as this is most likely a contributor to a cognitive impairment situation, blood pressure seems to be well controlled vital signs will be taken after this visit.  The patient is fully vaccinated she should be able to resume a lot of her normal social activities soon which will also help her to be stimulated and social interaction is one of the best brain driving exercises we can do.  I would like to order an MRI of the brain to make sure we do not have any focal atrophy strokes etc.  I will order this study likely without contrast considering that I do not know her exact kidney function at this time.  2) I will reorder CPAP supplies through [facility] - she lives close to [place]. CPAP supplies entail: AirFit P 10  mask, small size nasal pillow- and supplies for ResMed Airsense  10 .  She may consider a bella swift nasal pillow mask.   Larey Seat, MD [id number], [id number] AM  Certified in Neurology by ABPN Certified in Sleep Medicine  by [facility] [address], [place], [place] [address]

## 2020-03-16 NOTE — Telephone Encounter (Signed)
[employer] medicare order sent to [facility]. No auth they will reach out to the patient to schedule.  

## 2020-03-17 ENCOUNTER — Ambulatory Visit: Payer: Medicare Other | Admitting: Podiatry

## 2020-03-17 ENCOUNTER — Other Ambulatory Visit: Payer: Self-pay

## 2020-03-17 DIAGNOSIS — E1169 Type 2 diabetes mellitus with other specified complication: Secondary | ICD-10-CM

## 2020-03-17 DIAGNOSIS — E1142 Type 2 diabetes mellitus with diabetic polyneuropathy: Secondary | ICD-10-CM

## 2020-03-17 DIAGNOSIS — B351 Tinea unguium: Secondary | ICD-10-CM

## 2020-03-17 NOTE — Progress Notes (Signed)
  Subjective:  Patient ID: Catherine Mcconnell, female    DOB: 27-Jun-1946,  MRN: [id number]  Chief Complaint  Patient presents with  . Nail Problem    Thick, long, discolored toenails - bilateral. x"Years".   . Diabetes    Most recent HgbA1c = "8". Fasting AM glucose today = "155mg /dL". No history of neuropathy.    74 y.o. female presents with the above complaint. History confirmed with patient.   Objective:  Physical Exam: warm, good capillary refill, nail exam normal nails without lesions and onychomycosis of the toenails, no trophic changes or ulcerative lesions. DP pulses palpable and protective sensation intact Left Foot: normal exam, no swelling, tenderness, instability; ligaments intact, full range of motion of all ankle/foot joints  Right Foot: normal exam, no swelling, tenderness, instability; ligaments intact, full range of motion of all ankle/foot joints   No images are attached to the encounter.  Assessment:   1. Onychomycosis of multiple toenails with type 2 diabetes mellitus and peripheral neuropathy ([place])      Plan:  Patient was evaluated and treated and all questions answered.  Onychomycosis, Diabetes and PAD -Patient is diabetic with a qualifying condition for at risk foot care.  Procedure: Nail Debridement Rationale: Patient meets criteria for routine foot care due to PAD Type of Debridement: manual, sharp debridement. Instrumentation: Nail nipper, rotary burr. Number of Nails: 10    No follow-ups on file.

## 2020-03-20 ENCOUNTER — Other Ambulatory Visit: Payer: Self-pay

## 2020-03-20 ENCOUNTER — Ambulatory Visit
Admission: RE | Admit: 2020-03-20 | Discharge: 2020-03-20 | Disposition: A | Discharge status: Home / Self Care | Payer: Medicare Other | Source: Ambulatory Visit | Attending: Neurology | Admitting: Neurology

## 2020-03-20 DIAGNOSIS — R413 Other amnesia: Secondary | ICD-10-CM | POA: Diagnosis not present

## 2020-03-20 DIAGNOSIS — E119 Type 2 diabetes mellitus without complications: Secondary | ICD-10-CM

## 2020-03-20 DIAGNOSIS — N182 Chronic kidney disease, stage 2 (mild): Secondary | ICD-10-CM

## 2020-03-20 DIAGNOSIS — G4733 Obstructive sleep apnea (adult) (pediatric): Secondary | ICD-10-CM

## 2020-03-21 NOTE — Progress Notes (Signed)
Sent order to [facility]. Community message sent.

## 2020-03-21 NOTE — Progress Notes (Signed)
IMPRESSION: This MRI of the brain without contrast shows the following: 1.   Brain volume is normal for age. 2.   Few scattered T2/FLAIR hyperintense foci consistent with very minimal chronic microvascular ischemic change, typical for age. 3.   The pituitary gland is thinned within a normal sized sella turcica.  This is usually an incidental finding but could also be consistent with elevated intracranial pressure.   4.   There were no acute findings.

## 2020-03-22 ENCOUNTER — Other Ambulatory Visit: Payer: Self-pay | Admitting: Anesthesiology

## 2020-03-22 ENCOUNTER — Other Ambulatory Visit: Payer: Self-pay | Admitting: Student

## 2020-03-22 ENCOUNTER — Telehealth: Payer: Self-pay

## 2020-03-22 NOTE — Telephone Encounter (Signed)
I reached out to the pt and her daughter and advised of results.  They both verbalized understanding and had no further questions/concerns.   Comments to Patient Not seen   IMPRESSION: This MRI of the brain without contrast shows the following: 1.  Brain volume is normal for age. 2.  Few scattered T2/FLAIR hyperintense foci consistent with very minimal chronic microvascular ischemic change, typical for age. ...  Written by Larey Seat, MD on 03/21/2020 4:45 PM EDT View Full Comments

## 2020-03-28 ENCOUNTER — Ambulatory Visit: Payer: Medicare Other | Admitting: Adult Health

## 2020-03-30 LAB — IRON,TIBC AND FERRITIN PANEL
Ferritin: 319
Iron: 80

## 2020-03-30 LAB — COMPREHENSIVE METABOLIC PANEL
Calcium: 9.8 (ref 8.7–10.7)
GFR calc Af Amer: 34
GFR calc non Af Amer: 29

## 2020-03-30 LAB — BASIC METABOLIC PANEL
BUN: 23 — AB (ref 4–21)
Creatinine: 1.7 — AB (ref 0.5–1.1)
Glucose: 341
Potassium: 4.4 (ref 3.4–5.3)
Sodium: 136 — AB (ref 137–147)

## 2020-03-30 LAB — VITAMIN D 25 HYDROXY (VIT D DEFICIENCY, FRACTURES): Vit D, 25-Hydroxy: 20.3

## 2020-03-31 ENCOUNTER — Encounter: Payer: Self-pay | Admitting: Physician Assistant

## 2020-04-02 NOTE — Progress Notes (Signed)
  Subjective:  Patient ID: Catherine Mcconnell, female    DOB: Nov 18, 1945,  MRN: [id number]  Chief Complaint  Patient presents with  . Nail Problem    Pt states she has been applying antifungal ointment as directed daily. "When will my toenails be normal again"    74 y.o. female presents with the above complaint. History confirmed with patient.   Objective:  Physical Exam: warm, good capillary refill, nail exam normal nails without lesions and onychomycosis of the toenails, no trophic changes or ulcerative lesions. DP pulses palpable and protective sensation intact Left Foot: normal exam, no swelling, tenderness, instability; ligaments intact, full range of motion of all ankle/foot joints  Right Foot: normal exam, no swelling, tenderness, instability; ligaments intact, full range of motion of all ankle/foot joints   No images are attached to the encounter.  Assessment:   1. Onychomycosis of multiple toenails with type 2 diabetes mellitus and peripheral neuropathy ([place])      Plan:  Patient was evaluated and treated and all questions answered.  Onychomycosis, Diabetes and PAD -Improved. Debrided to reduce fungal burden. Non-procedure.   No follow-ups on file.

## 2020-04-16 ENCOUNTER — Other Ambulatory Visit: Payer: Self-pay | Admitting: Physician Assistant

## 2020-04-19 ENCOUNTER — Ambulatory Visit: Payer: Medicare Other | Admitting: Physician Assistant

## 2020-04-21 ENCOUNTER — Encounter: Payer: Self-pay | Admitting: Physician Assistant

## 2020-04-21 ENCOUNTER — Ambulatory Visit (INDEPENDENT_AMBULATORY_CARE_PROVIDER_SITE_OTHER): Payer: Medicare Other | Admitting: Physician Assistant

## 2020-04-21 ENCOUNTER — Other Ambulatory Visit: Payer: Self-pay

## 2020-04-21 VITALS — BP 158/76 | HR 63 | Temp 98.5°F | Ht 67.0 in | Wt 158.0 lb

## 2020-04-21 DIAGNOSIS — E119 Type 2 diabetes mellitus without complications: Secondary | ICD-10-CM

## 2020-04-21 LAB — POCT GLYCOSYLATED HEMOGLOBIN (HGB A1C): Hemoglobin A1C: 10.6 % — AB (ref 4.0–5.6)

## 2020-04-21 MED ORDER — INSULIN PEN NEEDLE 32G X 4 MM MISC: 1.0000 | 3 refills | Status: DC | PRN

## 2020-04-21 MED ORDER — INSULIN DEGLUDEC 100 UNIT/ML ~~LOC~~ SOPN: 10.0000 [IU] | PEN_INJECTOR | Freq: Every day | SUBCUTANEOUS | 3 refills | Status: DC

## 2020-04-21 NOTE — Progress Notes (Signed)
Catherine Mcconnell is a 74 y.o. female is here to discuss: Diabetes  I acted as a Education administrator for [employer], PA-C Serita Sheller, [place]  History of Present Illness:   Chief Complaint  Patient presents with  . Diabetes    HPI  Diabetes Pt following up today for her 3 month visit. She currently takes Januvia 50 mg and Rybelsus 3 mg. Most recent change was the addition of Rybelsus 3 mg daily. She is tolerating this addition well.  Pt tolerating well but is concerned because her readings have been running high -- fasting blood sugars running in the 200s. She denies any significant changes in her diet or lifestyle that explains these changes.. Denies hypoglycemia, no blurred or dizziness.   She was seeing endocrinology but she didn't have a good experience at the office, and was also told that she was likely going to have to start insulin, so she did not return to the office.  Her daughter is present today and is interested in patient starting insulin.  Lab Results  Component Value Date   HGBA1C 10.6 (A) 04/21/2020    Health Maintenance Due  Topic Date Due  . COLONOSCOPY  Never done  . PNA vac Low Risk Adult (2 of 2 - PCV13) 11/14/2019    Past Medical History:  Diagnosis Date  . Arthritis   . Chronic kidney disease   . Diabetes mellitus without complication ([place])   . Hypertension   . Iron deficiency    on oral iron supplements, has not required transfusion  . Mixed hyperlipidemia   . OSA on CPAP    compliant  . Thyroid nodule    removed     Social History   Tobacco Use  . Smoking status: Former Research scientist (life sciences)  . Smokeless tobacco: Never Used  . Tobacco comment: only socially in high school  Vaping Use  . Vaping Use: Never used  Substance Use Topics  . Alcohol use: Not Currently  . Drug use: Never    Past Surgical History:  Procedure Laterality Date  . ABDOMINAL HYSTERECTOMY  1996  . MASS EXCISION Right 2017   right arm mass removal  . THYROID SURGERY     left  side removal benign nodule    Family History  Problem Relation Age of Onset  . Stroke Mother   . Hypertension Mother   . Heart disease Mother   . Heart attack Father   . Early death Father   . Hypertension Father   . Diabetes Sister   . Early death Sister   . Hypertension Sister   . Stroke Sister   . Prostate cancer Son   . Cancer Son   . Early death Son   . Breast cancer Cousin   . Diabetes Sister   . Early death Sister   . Hyperlipidemia Sister   . Kidney disease Sister   . Depression Daughter   . Hypertension Daughter   . Hyperlipidemia Daughter   . Diabetes Sister   . Hyperlipidemia Sister   . Hypertension Sister   . Early death Brother   . Arthritis Brother   . Heart attack Brother   . Heart disease Brother   . Arthritis Brother   . Diabetes Sister     PMHx, SurgHx, SocialHx, FamHx, Medications, and Allergies were reviewed in the Visit Navigator and updated as appropriate.   Patient Active Problem List   Diagnosis Date Noted  . Memory deficits 03/16/2020  . Diabetes mellitus without complication (  [place]) 11/13/2018  . OSA on CPAP   . Chronic kidney disease   . Hypertension   . Mixed hyperlipidemia     Social History   Tobacco Use  . Smoking status: Former Research scientist (life sciences)  . Smokeless tobacco: Never Used  . Tobacco comment: only socially in high school  Vaping Use  . Vaping Use: Never used  Substance Use Topics  . Alcohol use: Not Currently  . Drug use: Never    Current Medications and Allergies:    Current Outpatient Medications:  .  amLODipine (NORVASC) 10 MG tablet, TAKE 1 TABLET BY MOUTH  DAILY, Disp: 90 tablet, Rfl: 3 .  aspirin EC 81 MG tablet, Take 81 mg by mouth daily., Disp: , Rfl:  .  atorvastatin (LIPITOR) 40 MG tablet, TAKE 1 TABLET BY MOUTH  DAILY, Disp: 90 tablet, Rfl: 3 .  carvedilol (COREG) 6.25 MG tablet, TAKE 1 TABLET BY MOUTH  TWICE DAILY WITH MEALS, Disp: 180 tablet, Rfl: 3 .  ferrous sulfate (IRON SUPPLEMENT) 325 (65 FE) MG tablet,  Take 1 tablet (325 mg total) by mouth 2 (two) times daily with a meal. (Patient taking differently: Take 325 mg by mouth daily. ), Disp: 60 tablet, Rfl: 2 .  gabapentin (NEURONTIN) 100 MG capsule, TAKE 3 CAPSULES BY MOUTH  DAILY, Disp: 270 capsule, Rfl: 1 .  JANUVIA 50 MG tablet, TAKE 1 TABLET BY MOUTH  DAILY, Disp: 90 tablet, Rfl: 3 .  Lancets (ONETOUCH ULTRASOFT) lancets, CHECK TWICE PER DAY BEFORE  BREAKFAST AND DINNER, Disp: 200 each, Rfl: 4 .  losartan (COZAAR) 50 MG tablet, Take 50 mg by mouth daily., Disp: , Rfl:  .  LUMIGAN 0.01 % SOLN, Place 1 drop into both eyes every evening., Disp: , Rfl:  .  ONETOUCH ULTRA test strip, USE TO TEST BLOOD SUGARS  TWICE DAILY, Disp: 200 strip, Rfl: 3 .  Semaglutide (RYBELSUS) 3 MG TABS, Take 3 mg by mouth daily., Disp: 30 tablet, Rfl: 1 .  traMADol (ULTRAM) 50 MG tablet, Take 1 tablet (50 mg total) by mouth every 12 (twelve) hours as needed., Disp: 30 tablet, Rfl: 1 .  insulin degludec (TRESIBA) 100 UNIT/ML FlexTouch Pen, Inject 0.1 mLs (10 Units total) into the skin daily., Disp: 5 pen, Rfl: 3 .  Insulin Pen Needle 32G X 4 MM MISC, 1 each by Does not apply route as needed., Disp: 100 each, Rfl: 3   Allergies  Allergen Reactions  . Lisinopril Rash  . Metformin And Related Swelling    Review of Systems   ROS  Negative unless otherwise specified per HPI.  Vitals:   Vitals:   04/21/20 1101  BP: (!) 158/76  Pulse: 63  Temp: 98.5 F (36.9 C)  TempSrc: Temporal  SpO2: 99%  Weight: 158 lb (71.7 kg)  Height: 5\' 7"  (1.702 m)     Body mass index is 24.75 kg/m.   Physical Exam:    Physical Exam Vitals and nursing note reviewed.  Constitutional:      General: She is not in acute distress.    Appearance: She is well-developed. She is not ill-appearing or toxic-appearing.  Cardiovascular:     Rate and Rhythm: Normal rate and regular rhythm.     Pulses: Normal pulses.     Heart sounds: Normal heart sounds, S1 normal and S2 normal.      Comments: No LE edema Pulmonary:     Effort: Pulmonary effort is normal.     Breath sounds: Normal breath sounds.  Skin:    General: Skin is warm and dry.  Neurological:     Mental Status: She is alert.     GCS: GCS eye subscore is 4. GCS verbal subscore is 5. GCS motor subscore is 6.  Psychiatric:        Speech: Speech normal.        Behavior: Behavior normal. Behavior is cooperative.    Results for orders placed or performed in visit on 04/21/20  POCT HgB A1C  Result Value Ref Range   Hemoglobin A1C 10.6 (A) 4.0 - 5.6 %   HbA1c POC (<> result, manual entry)     HbA1c, POC (prediabetic range)     HbA1c, POC (controlled diabetic range)        Assessment and Plan:    Khori was seen today for diabetes.  Diagnoses and all orders for this visit:  Diabetes mellitus without complication ([place]) Worsening control. Continue Rybelsus 3 mg daily (we briefly discussed increasing however she just received a 90 day supply of 3 mg and would like to stay on this.) Continue Januva 50 mg daily. Start daily tresiba 10 U daily. Instructed as follows: "Start Tresiba 10 units daily. Increase the dose by 2 units every 4 days until sugars in am <140 or you reach 30 units." Follow-up in 3 months. Blood sugar log provided at todays visit. -     POCT HgB A1C  Other orders -     insulin degludec (TRESIBA) 100 UNIT/ML FlexTouch Pen; Inject 0.1 mLs (10 Units total) into the skin daily. -     Insulin Pen Needle 32G X 4 MM MISC; 1 each by Does not apply route as needed.  .  Reviewed expectations re: course of current medical issues. . Discussed self-management of symptoms. . Outlined signs and symptoms indicating need for more acute intervention. . Patient verbalized understanding and all questions were answered. . See orders for this visit as documented in the electronic medical record. . Patient received an After Visit Summary.  CMA or LPN served as scribe during this visit. History, Physical,  and Plan performed by medical provider. The above documentation has been reviewed and is accurate and complete.   Inda Coke, PA-C [place], [place] 04/21/2020  Follow-up: No follow-ups on file.

## 2020-04-21 NOTE — Patient Instructions (Addendum)
It was great to see you!  CONTINUE: Rybelsus 3 mg daily Januvia 50 mg daily  START: Tresiba 10 units daily Increase the dose by 2 units every 4 days until sugars in am <140 or you reach 30 units  Keep diligent sugar logs and document dose increases for me.  See you in 3 months!  Take care,  Inda Coke PA-C

## 2020-04-28 ENCOUNTER — Other Ambulatory Visit: Payer: Self-pay

## 2020-04-28 ENCOUNTER — Ambulatory Visit: Payer: Medicare Other | Admitting: Podiatry

## 2020-04-28 DIAGNOSIS — E1142 Type 2 diabetes mellitus with diabetic polyneuropathy: Secondary | ICD-10-CM | POA: Diagnosis not present

## 2020-04-28 DIAGNOSIS — B351 Tinea unguium: Secondary | ICD-10-CM

## 2020-04-28 DIAGNOSIS — B353 Tinea pedis: Secondary | ICD-10-CM | POA: Diagnosis not present

## 2020-04-28 DIAGNOSIS — E1169 Type 2 diabetes mellitus with other specified complication: Secondary | ICD-10-CM | POA: Diagnosis not present

## 2020-04-28 MED ORDER — FLUCONAZOLE 150 MG PO TABS: 150.0000 mg | ORAL_TABLET | ORAL | 0 refills | Status: DC

## 2020-04-28 MED ORDER — KETOCONAZOLE 2 % EX CREA
TOPICAL_CREAM | CUTANEOUS | 0 refills | Status: DC
Start: 2020-04-28 — End: 2021-05-22

## 2020-04-28 NOTE — Progress Notes (Signed)
  Subjective:  Patient ID: Catherine Mcconnell, female    DOB: 11/22/1945,  MRN: [id number]  Chief Complaint  Patient presents with  . Nail Problem    Pt states topical ointment has helped some. Nail trim 1-5 bilateral.    74 y.o. female presents with the above complaint. History confirmed with patient. Still has some scaling to the feet.  Objective:  Physical Exam: warm, good capillary refill, nail exam normal nails without lesions and onychomycosis of the toenails, no trophic changes or ulcerative lesions. DP pulses palpable and protective sensation intact. Xerosis with scaling plantar foot bilat. Left Foot: normal exam, no swelling, tenderness, instability; ligaments intact, full range of motion of all ankle/foot joints  Right Foot: normal exam, no swelling, tenderness, instability; ligaments intact, full range of motion of all ankle/foot joints   No images are attached to the encounter.  Assessment:   1. 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 Tinea pedis of both feet      Plan:  Patient was evaluated and treated and all questions answered.  Onychomycosis, Diabetes and PAD -Nails debrided as below -Rx fluconazole and Ketoconazole for tinea pedis  Procedure: Nail Debridement Rationale: Patient meets criteria for routine foot care due to PAD Type of Debridement: manual, sharp debridement. Instrumentation: Nail nipper, rotary burr. Number of Nails: 10   No follow-ups on file.

## 2020-05-05 ENCOUNTER — Telehealth: Payer: Self-pay

## 2020-05-05 NOTE — Telephone Encounter (Signed)
Please call and obtain more information.   

## 2020-05-05 NOTE — Telephone Encounter (Signed)
Pt's daughter, Rockwell [place] notified to decrease down to 12 units and to further decrease to 10 units if fatigue continues. Notified her to keep [place] updated and to let us know on Mon how everything goes.

## 2020-05-05 NOTE — Telephone Encounter (Signed)
Please advise 

## 2020-05-05 NOTE — Telephone Encounter (Signed)
Pt is increasing every 4 days, now at 14 units. Pt is supposed to go up to 16 U tomorrow. Readings have been <140. It was 202 this morning, she did not eat anything unusual. Pt had a grilled chicken salad with a half croissant. Yesterday was her blood sugar levels were 98 and 88, Sunday 98, and 79 on Monday. Experiencing increased fatigue. Was supposed to increase on Tuesday, but did not due to low levels.

## 2020-05-05 NOTE — Telephone Encounter (Signed)
Pt.s daughter would like to be called in regards to her mother's insulin. She is concerned to up her mother's insulin due to her mom's readings running low at times. Daughter would like to be advised with what to do next .

## 2020-05-05 NOTE — Telephone Encounter (Signed)
I recommend that she not further increase her current insulin dose at this time. To prevent further hypoglycemia, I would like for her to go back to 12 units at this time. I think that she may be too tightly controlled.  If she continues to have fatigue, may decrease to 10 units (or lower if needed).  The goal is to not feel fatigued and also not feel like blood sugars are continuing to trend <80. I would prefer her lower numbers to be closer to 100.  Please continue to keep close eye on blood sugars and let us know on Monday how the weekend goes.

## 2020-05-08 NOTE — Telephone Encounter (Signed)
See below

## 2020-05-23 ENCOUNTER — Other Ambulatory Visit: Payer: Self-pay | Admitting: Physician Assistant

## 2020-06-02 ENCOUNTER — Other Ambulatory Visit: Payer: Self-pay | Admitting: Physician Assistant

## 2020-06-06 ENCOUNTER — Ambulatory Visit (INDEPENDENT_AMBULATORY_CARE_PROVIDER_SITE_OTHER): Payer: Medicare Other | Admitting: Internal Medicine

## 2020-06-06 ENCOUNTER — Other Ambulatory Visit: Payer: Self-pay

## 2020-06-06 ENCOUNTER — Encounter: Payer: Self-pay | Admitting: Internal Medicine

## 2020-06-06 VITALS — BP 150/80 | HR 64 | Ht 67.0 in | Wt 163.4 lb

## 2020-06-06 DIAGNOSIS — E1165 Type 2 diabetes mellitus with hyperglycemia: Secondary | ICD-10-CM | POA: Insufficient documentation

## 2020-06-06 DIAGNOSIS — E119 Type 2 diabetes mellitus without complications: Secondary | ICD-10-CM | POA: Diagnosis not present

## 2020-06-06 DIAGNOSIS — Z794 Long term (current) use of insulin: Secondary | ICD-10-CM

## 2020-06-06 LAB — POCT GLUCOSE (DEVICE FOR HOME USE): POC Glucose: 271 mg/dl — AB (ref 70–99)

## 2020-06-06 MED ORDER — INSULIN PEN NEEDLE 32G X 4 MM MISC
1.0000 | 3 refills | Status: DC | PRN
Start: 2020-06-06 — End: 2021-01-19

## 2020-06-06 MED ORDER — TRESIBA FLEXTOUCH 100 UNIT/ML ~~LOC~~ SOPN: 10.0000 [IU] | PEN_INJECTOR | Freq: Every day | SUBCUTANEOUS | 3 refills | Status: DC

## 2020-06-06 MED ORDER — RYBELSUS 7 MG PO TABS: 7.0000 mg | ORAL_TABLET | Freq: Every day | ORAL | 1 refills | Status: DC

## 2020-06-06 NOTE — Patient Instructions (Addendum)
-   STOP Januvia  - Increase Rybelsus to 7 mg daily  With Breakfast  (You can take 2 tablets of the 3 mg for now) - Start Tresiba 10 units daily    - Check sugar fasting and bedtime      HOW TO TREAT LOW BLOOD SUGARS (Blood sugar LESS THAN 70 MG/DL)  Please follow the RULE OF 15 for the treatment of hypoglycemia treatment (when your (blood sugars are less than 70 mg/dL)    STEP 1: Take 15 grams of carbohydrates when your blood sugar is low, which includes:   3-4 GLUCOSE TABS  OR  3-4 OZ OF JUICE OR REGULAR SODA OR  ONE TUBE OF GLUCOSE GEL     STEP 2: RECHECK blood sugar in 15 MINUTES STEP 3: If your blood sugar is still low at the 15 minute recheck --> then, go back to STEP 1 and treat AGAIN with another 15 grams of carbohydrates.

## 2020-06-06 NOTE — Progress Notes (Signed)
Name: Catherine Mcconnell  MRN/ DOB: [id number], Apr 08, 1946   Age/ Sex: 74 y.o., female    PCP: Inda Coke, PA   Reason for Endocrinology Evaluation: Type 2 Diabetes Mellitus     Date of Initial Endocrinology Visit: 08/10/2019    PATIENT IDENTIFIER: Catherine Mcconnell is a 75 y.o. female with a past medical history of HTN and T2DM . The patient presented for initial endocrinology clinic visit on 06/06/2020 for consultative assistance with her diabetes management.    HPI: Catherine Mcconnell is accompanied by her daughter Catherine Mcconnell    Diagnosed with DM at age 89 Prior Medications tried/Intolerance:  Currently checking blood sugars 1 x / day Hypoglycemia episodes : no               Hemoglobin A1c has ranged from 7.7% in 2021, peaking at 10.6%  in 2021 Patient required assistance for hypoglycemia: no Patient has required hospitalization within the last 1 year from hyper or hypoglycemia: no   In terms of diet, the patient eats 3 meals a day, snacks rarely. Drinks sweet tea rarely.     Denies nausea /vomiting/ diarrhea Denies tingling  Of the feet    HOME DIABETES REGIMEN: Tyler Aas 14 units - not taking 05/2020 - stopped due to swelling ? Januvia 50 mg daily  Rybelsus 3 mg daily    Statin: yes ACE-I/ARB: Yes Prior Diabetic Education: yes    METER DOWNLOAD SUMMARY: Did not bring    DIABETIC COMPLICATIONS: Microvascular complications:   Neuropathy, CKD III  Denies:  Retinopathy   Last eye exam: Completed 05/2020  Macrovascular complications:    Denies: CAD, PVD, CVA   PAST HISTORY: Past Medical History:  Past Medical History:  Diagnosis Date  . Arthritis   . Chronic kidney disease   . Diabetes mellitus without complication ([place])   . Hypertension   . Iron deficiency    on oral iron supplements, has not required transfusion  . Mixed hyperlipidemia   . OSA on CPAP    compliant  . Thyroid nodule    removed   Past Surgical History:  Past Surgical History:    Procedure Laterality Date  . ABDOMINAL HYSTERECTOMY  1996  . MASS EXCISION Right 2017   right arm mass removal  . THYROID SURGERY     left side removal benign nodule      Social History:  reports that she has quit smoking. She has never used smokeless tobacco. She reports previous alcohol use. She reports that she does not use drugs. Family History:  Family History  Problem Relation Age of Onset  . Stroke Mother   . Hypertension Mother   . Heart disease Mother   . Heart attack Father   . Early death Father   . Hypertension Father   . Diabetes Sister   . Early death Sister   . Hypertension Sister   . Stroke Sister   . Prostate cancer Son   . Cancer Son   . Early death Son   . Breast cancer Cousin   . Diabetes Sister   . Early death Sister   . Hyperlipidemia Sister   . Kidney disease Sister   . Depression Daughter   . Hypertension Daughter   . Hyperlipidemia Daughter   . Diabetes Sister   . Hyperlipidemia Sister   . Hypertension Sister   . Early death Brother   . Arthritis Brother   . Heart attack Brother   . Heart disease Brother   .  Arthritis Brother   . Diabetes Sister      HOME MEDICATIONS: Allergies as of 06/06/2020      Reactions   Lisinopril Rash   Metformin And Related Swelling      Medication List       Accurate as of June 06, 2020 10:30 AM. If you have any questions, ask your nurse or doctor.        STOP taking these medications   fluconazole 150 MG tablet Commonly known as: DIFLUCAN Stopped by: Dorita Sciara, MD   insulin degludec 100 UNIT/ML FlexTouch Pen Commonly known as: TRESIBA Stopped by: Dorita Sciara, MD     TAKE these medications   amLODipine 10 MG tablet Commonly known as: NORVASC TAKE 1 TABLET BY MOUTH  DAILY   aspirin EC 81 MG tablet Take 81 mg by mouth daily.   atorvastatin 40 MG tablet Commonly known as: LIPITOR TAKE 1 TABLET BY MOUTH  DAILY   carvedilol 6.25 MG tablet Commonly known as:  COREG TAKE 1 TABLET BY MOUTH  TWICE DAILY WITH MEALS   ferrous sulfate 325 (65 FE) MG tablet Commonly known as: Iron Supplement Take 1 tablet (325 mg total) by mouth 2 (two) times daily with a meal. What changed: when to take this   gabapentin 100 MG capsule Commonly known as: NEURONTIN TAKE 3 CAPSULES BY MOUTH  DAILY   Insulin Pen Needle 32G X 4 MM Misc 1 each by Does not apply route as needed.   Januvia 50 MG tablet Generic drug: sitaGLIPtin TAKE 1 TABLET BY MOUTH  DAILY   ketoconazole 2 % cream Commonly known as: NIZORAL Apply 1 fingertip amount to each foot daily.   losartan 50 MG tablet Commonly known as: COZAAR Take 50 mg by mouth daily.   Lumigan 0.01 % Soln Generic drug: bimatoprost Place 1 drop into both eyes every evening.   OneTouch Ultra test strip Generic drug: glucose blood USE TO TEST BLOOD SUGARS  TWICE DAILY   onetouch ultrasoft lancets CHECK TWICE PER DAY BEFORE  BREAKFAST AND DINNER   Rybelsus 3 MG Tabs Generic drug: Semaglutide TAKE 1 TABLET BY MOUTH  DAILY   traMADol 50 MG tablet Commonly known as: ULTRAM Take 1 tablet (50 mg total) by mouth every 12 (twelve) hours as needed.        ALLERGIES: Allergies  Allergen Reactions  . Lisinopril Rash  . Metformin And Related Swelling     REVIEW OF SYSTEMS: A comprehensive ROS was conducted with the patient and is negative except as per HPI    OBJECTIVE:   VITAL SIGNS: BP (!) 150/80 (BP Location: Left Arm, Patient Position: Sitting, Cuff Size: Large)   Pulse 64   Ht 5\' 7"  (1.702 m)   Wt 163 lb 6.4 oz (74.1 kg)   SpO2 98%   BMI 25.59 kg/m      PHYSICAL EXAM:  General: Pt appears well and is in NAD  Neck: General: Supple without adenopathy or carotid bruits. Thyroid: Thyroid size normal.  No goiter or nodules appreciated. No thyroid bruit.  Lungs: Clear with good BS bilat with no rales, rhonchi, or wheezes  Heart: RRR with normal S1 and S2 and no gallops; no murmurs; no rub   Abdomen: Normoactive bowel sounds, soft, nontender, without masses or organomegaly palpable  Extremities:  Lower extremities - 1+  pretibial edema  Neuro: MS is good with appropriate affect, pt is alert and Ox3    DM foot exam: 06/06/2020  The  skin of the feet is intact without sores or ulcerations. The pedal pulses are 2+ on right and 2+ on left. The sensation is intact to a screening 5.07, 10 gram monofilament bilaterally   DATA REVIEWED:  Lab Results  Component Value Date   HGBA1C 10.6 (A) 04/21/2020   HGBA1C 8.4 (A) 01/19/2020   HGBA1C 7.7 (A) 10/19/2019    Results for DEZRA, MANDELLA (MRN [id number]) as of 06/07/2020 12:34  Ref. Range 06/06/2020 11:13  Sodium Latest Ref Range: 135 - 146 mmol/L 136  Potassium Latest Ref Range: 3.5 - 5.3 mmol/L 4.4  Chloride Latest Ref Range: 98 - 110 mmol/L 99  CO2 Latest Ref Range: 20 - 32 mmol/L 27  Glucose Latest Ref Range: 65 - 99 mg/dL 248 (H)  BUN Latest Ref Range: 7 - 25 mg/dL 18  Creatinine Latest Ref Range: 0.60 - 0.93 mg/dL 1.48 (H)  Calcium Latest Ref Range: 8.6 - 10.4 mg/dL 9.1  BUN/Creatinine Ratio Latest Ref Range: 6 - 22 (calc) 12  Total CHOL/HDL Ratio Latest Ref Range: <5.0 (calc) 3.6  Cholesterol Latest Ref Range: <200 mg/dL 217 (H)  HDL Cholesterol Latest Ref Range: > OR = 50 mg/dL 61  LDL Cholesterol (Calc) Latest Units: mg/dL (calc) 134 (H)  MICROALB/CREAT RATIO Latest Ref Range: <30 mcg/mg creat 1,543 (H)  Non-HDL Cholesterol (Calc) Latest Ref Range: <130 mg/dL (calc) 156 (H)  Triglycerides Latest Ref Range: <150 mg/dL 108  Microalb, Ur Latest Units: mg/dL 140.4  Creatinine, Urine Latest Ref Range: 20 - 275 mg/dL 91          ASSESSMENT / PLAN / RECOMMENDATIONS:   1) Type 2 Diabetes Mellitus, Poorly controlled, With CKD III complications - Most recent A1c of 10.6 %. Goal A1c < 7.0 %.    Plan: GENERAL: I have discussed with the patient the pathophysiology of diabetes. We went over the natural progression of  the disease. We talked about both insulin resistance and insulin deficiency. We stressed the importance of lifestyle changes including diet and exercise. I explained the complications associated with diabetes including retinopathy, nephropathy, neuropathy as well as increased risk of cardiovascular disease. We went over the benefit seen with glycemic control.    I explained to the patient that diabetic patients are at higher than normal risk for amputations.   I have discussed the safety of insulin in the setting of CKD , and I have encouraged the pt to restart it. As for her LE edema, I have encouraged her to reach out to her nephrologist as her CKD could be a contributing factor  Advised pt to avoid sugar-sweetened beverages and avoid snacks.   In-office BG 271 mg/dL ( fasting )- discussed low carb diet     MEDICATIONS: - STOP Januvia  - Increase Rybelsus to 7 mg daily  With Breakfast  (You can take 2 tablets of the 3 mg for now) - Start Tresiba 10 units daily   EDUCATION / INSTRUCTIONS:  BG monitoring instructions: Patient is instructed to check her blood sugars 2 times a day, fasting and bedtime   Call [facility] if: BG persistently < 70  . I reviewed the Rule of 15 for the treatment of hypoglycemia in detail with the patient. Literature supplied.   2) Diabetic complications:   Eye: Does not  have known diabetic retinopathy.   Neuro/ Feet: Does not have known diabetic peripheral neuropathy.  Renal: Patient does  have known baseline CKD. She is on an ACEI/ARB at present. Up  to date on urine albumin/creatinine ratio    3) Dyslipidemia: Patient is on Atorvastatin 40 mg daily . Discussed cardiovascular benefits of statins. Will encourage low fat diet. If LDL remains above goal will consider adding Zetia.     F/U in 3 months     Signed electronically by: Mack Guise, MD  [facility]  [facility] [address]  [address] [place], [place] [address] Phone: [phone] FAX: [phone]   CC: Inda Coke, [address] [place] [address] Phone: [phone]  Fax: [phone]    Return to Endocrinology clinic as below: Future Appointments  Date Time Provider [place]  07/17/2020  9:00 AM Ward Givens, NP [facility] None  07/24/2020 10:30 AM Inda Coke, PA [facility]  08/08/2020 10:15 AM March Rummage, Christian Mate, DPM [facility]

## 2020-06-07 LAB — BASIC METABOLIC PANEL
BUN/Creatinine Ratio: 12 (calc) (ref 6–22)
BUN: 18 mg/dL (ref 7–25)
CO2: 27 mmol/L (ref 20–32)
Calcium: 9.1 mg/dL (ref 8.6–10.4)
Chloride: 99 mmol/L (ref 98–110)
Creat: 1.48 mg/dL — ABNORMAL HIGH (ref 0.60–0.93)
Glucose, Bld: 248 mg/dL — ABNORMAL HIGH (ref 65–99)
Potassium: 4.4 mmol/L (ref 3.5–5.3)
Sodium: 136 mmol/L (ref 135–146)

## 2020-06-07 LAB — MICROALBUMIN / CREATININE URINE RATIO
Creatinine, Urine: 91 mg/dL (ref 20–275)
Microalb Creat Ratio: 1543 mcg/mg creat — ABNORMAL HIGH (ref <30)
Microalb, Ur: 140.4 mg/dL

## 2020-06-07 LAB — LIPID PANEL
Cholesterol: 217 mg/dL — ABNORMAL HIGH (ref <200)
HDL: 61 mg/dL (ref >=50)
LDL Cholesterol (Calc): 134 mg/dL (calc) — ABNORMAL HIGH
Non-HDL Cholesterol (Calc): 156 mg/dL (calc) — ABNORMAL HIGH (ref <130)
Total CHOL/HDL Ratio: 3.6 (calc) (ref <5.0)
Triglycerides: 108 mg/dL (ref <150)

## 2020-06-14 ENCOUNTER — Encounter: Payer: Self-pay | Admitting: Physician Assistant

## 2020-06-14 ENCOUNTER — Other Ambulatory Visit: Payer: Self-pay

## 2020-06-14 ENCOUNTER — Ambulatory Visit (INDEPENDENT_AMBULATORY_CARE_PROVIDER_SITE_OTHER): Payer: Medicare Other | Admitting: Physician Assistant

## 2020-06-14 VITALS — BP 146/80 | HR 61 | Temp 97.5°F | Ht 67.0 in | Wt 167.0 lb

## 2020-06-14 DIAGNOSIS — R300 Dysuria: Secondary | ICD-10-CM | POA: Diagnosis not present

## 2020-06-14 DIAGNOSIS — M545 Low back pain: Secondary | ICD-10-CM | POA: Diagnosis not present

## 2020-06-14 DIAGNOSIS — M7989 Other specified soft tissue disorders: Secondary | ICD-10-CM | POA: Diagnosis not present

## 2020-06-14 DIAGNOSIS — M25551 Pain in right hip: Secondary | ICD-10-CM

## 2020-06-14 DIAGNOSIS — G8929 Other chronic pain: Secondary | ICD-10-CM

## 2020-06-14 LAB — POC URINALSYSI DIPSTICK (AUTOMATED)
Bilirubin, UA: NEGATIVE
Blood, UA: NEGATIVE
Glucose, UA: NEGATIVE
Ketones, UA: NEGATIVE
Leukocytes, UA: NEGATIVE
Nitrite, UA: NEGATIVE
Protein, UA: POSITIVE — AB
Spec Grav, UA: 1.01 (ref 1.010–1.025)
Urobilinogen, UA: 0.2 E.U./dL
pH, UA: 7 (ref 5.0–8.0)

## 2020-06-14 MED ORDER — FUROSEMIDE 20 MG PO TABS: 20.0000 mg | ORAL_TABLET | Freq: Every day | ORAL | 1 refills | Status: DC | PRN

## 2020-06-14 MED ORDER — ACETAMINOPHEN-CODEINE #3 300-30 MG PO TABS: 2.0000 | ORAL_TABLET | Freq: Three times a day (TID) | ORAL | 0 refills | Status: DC | PRN

## 2020-06-14 NOTE — Progress Notes (Signed)
Catherine Mcconnell is a 74 y.o. female here for a new problem.  I acted as a Education administrator for [employer], PA-C Anselmo Pickler, LPN   History of Present Illness:   Chief Complaint  Patient presents with  . Hip/Back pain  . Edema    HPI   Hip/Back pain Pt c/o right hip/back pain for several years, and has been worse past month. Pt is taking Tylenol Arthritis and Tramodol for pain with some relief. Denies numbness or tingling. Had a R hip injection several years ago for possible bursitis? Has never done PT. Denies bowel/bladder incontinence.  Edema Pt c/o bilateral ankle and foot edema R>L since on Tresiba, was started in June. Has been trying to keep her legs elevated and this hasn't helped. Denies pain or recent dietary indescretion. Denies chest pain or SOB. Does have history of iron deficiency. She has reached out to her nephrologist but hasn't had any success in returning the call.   Past Medical History:  Diagnosis Date  . Arthritis   . Chronic kidney disease   . Diabetes mellitus without complication ([place])   . Hypertension   . Iron deficiency    on oral iron supplements, has not required transfusion  . Mixed hyperlipidemia   . OSA on CPAP    compliant  . Thyroid nodule    removed     Social History   Tobacco Use  . Smoking status: Former Research scientist (life sciences)  . Smokeless tobacco: Never Used  . Tobacco comment: only socially in high school  Vaping Use  . Vaping Use: Never used  Substance Use Topics  . Alcohol use: Not Currently  . Drug use: Never    Past Surgical History:  Procedure Laterality Date  . ABDOMINAL HYSTERECTOMY  1996  . MASS EXCISION Right 2017   right arm mass removal  . THYROID SURGERY     left side removal benign nodule    Family History  Problem Relation Age of Onset  . Stroke Mother   . Hypertension Mother   . Heart disease Mother   . Heart attack Father   . Early death Father   . Hypertension Father   . Diabetes Sister   . Early death Sister    . Hypertension Sister   . Stroke Sister   . Prostate cancer Son   . Cancer Son   . Early death Son   . Breast cancer Cousin   . Diabetes Sister   . Early death Sister   . Hyperlipidemia Sister   . Kidney disease Sister   . Depression Daughter   . Hypertension Daughter   . Hyperlipidemia Daughter   . Diabetes Sister   . Hyperlipidemia Sister   . Hypertension Sister   . Early death Brother   . Arthritis Brother   . Heart attack Brother   . Heart disease Brother   . Arthritis Brother   . Diabetes Sister     Allergies  Allergen Reactions  . Lisinopril Rash  . Metformin And Related Swelling    Current Medications:   Current Outpatient Medications:  .  amLODipine (NORVASC) 10 MG tablet, TAKE 1 TABLET BY MOUTH  DAILY, Disp: 90 tablet, Rfl: 3 .  aspirin EC 81 MG tablet, Take 81 mg by mouth daily., Disp: , Rfl:  .  atorvastatin (LIPITOR) 40 MG tablet, TAKE 1 TABLET BY MOUTH  DAILY, Disp: 90 tablet, Rfl: 3 .  carvedilol (COREG) 6.25 MG tablet, TAKE 1 TABLET BY MOUTH  TWICE DAILY  WITH MEALS, Disp: 180 tablet, Rfl: 3 .  ferrous sulfate (IRON SUPPLEMENT) 325 (65 FE) MG tablet, Take 1 tablet (325 mg total) by mouth 2 (two) times daily with a meal. (Patient taking differently: Take 325 mg by mouth daily. ), Disp: 60 tablet, Rfl: 2 .  gabapentin (NEURONTIN) 100 MG capsule, TAKE 3 CAPSULES BY MOUTH  DAILY, Disp: 270 capsule, Rfl: 3 .  insulin degludec (TRESIBA FLEXTOUCH) 100 UNIT/ML FlexTouch Pen, Inject 0.1 mLs (10 Units total) into the skin daily., Disp: 15 mL, Rfl: 3 .  Insulin Pen Needle 32G X 4 MM MISC, 1 each by Does not apply route as needed., Disp: 100 each, Rfl: 3 .  ketoconazole (NIZORAL) 2 % cream, Apply 1 fingertip amount to each foot daily., Disp: 30 g, Rfl: 0 .  Lancets (ONETOUCH ULTRASOFT) lancets, CHECK TWICE PER DAY BEFORE  BREAKFAST AND DINNER, Disp: 200 each, Rfl: 4 .  losartan (COZAAR) 50 MG tablet, Take 50 mg by mouth daily., Disp: , Rfl:  .  LUMIGAN 0.01 % SOLN,  Place 1 drop into both eyes every evening., Disp: , Rfl:  .  ONETOUCH ULTRA test strip, USE TO TEST BLOOD SUGARS  TWICE DAILY, Disp: 200 strip, Rfl: 3 .  Semaglutide (RYBELSUS) 7 MG TABS, Take 7 mg by mouth daily., Disp: 90 tablet, Rfl: 1 .  acetaminophen-codeine (TYLENOL #3) 300-30 MG tablet, Take 2 tablets by mouth every 8 (eight) hours as needed for severe pain., Disp: 30 tablet, Rfl: 0 .  furosemide (LASIX) 20 MG tablet, Take 1 tablet (20 mg total) by mouth daily as needed for edema., Disp: 30 tablet, Rfl: 1   Review of Systems:   ROS  Negative unless otherwise specified per HPI.   Vitals:   Vitals:   06/14/20 1305  BP: (!) 146/80  Pulse: 61  Temp: (!) 97.5 F (36.4 C)  TempSrc: Temporal  SpO2: 97%  Weight: 167 lb (75.8 kg)  Height: 5\' 7"  (1.702 m)     Body mass index is 26.16 kg/m.  Physical Exam:   Physical Exam Vitals and nursing note reviewed.  Constitutional:      General: She is not in acute distress.    Appearance: She is well-developed. She is not ill-appearing or toxic-appearing.  Cardiovascular:     Rate and Rhythm: Normal rate and regular rhythm.     Pulses: Normal pulses.     Heart sounds: Normal heart sounds, S1 normal and S2 normal.  Pulmonary:     Effort: Pulmonary effort is normal.     Breath sounds: Normal breath sounds.  Musculoskeletal:     Right lower leg: 1+ Edema present.     Left lower leg: 1+ Edema present.     Comments: No calf tenderness/erythema; negative Homan's sign  Back: TTP to R SI joint. No bony tenderness. Normal ROM.  R hip: normal ROM. No bony tenderness.  Skin:    General: Skin is warm and dry.  Neurological:     Mental Status: She is alert.     GCS: GCS eye subscore is 4. GCS verbal subscore is 5. GCS motor subscore is 6.  Psychiatric:        Speech: Speech normal.        Behavior: Behavior normal. Behavior is cooperative.    Results for orders placed or performed in visit on 06/14/20  POCT Urinalysis Dipstick  (Automated)  Result Value Ref Range   Color, UA light yellow    Clarity, UA clear  Glucose, UA Negative Negative   Bilirubin, UA Negative    Ketones, UA Negative    Spec Grav, UA 1.010 1.010 - 1.025   Blood, UA Negative    pH, UA 7.0 5.0 - 8.0   Protein, UA Positive (A) Negative   Urobilinogen, UA 0.2 0.2 or 1.0 E.U./dL   Nitrite, UA Negative    Leukocytes, UA Negative Negative     Assessment and Plan:   [place] was seen today for hip/back pain and edema.  Diagnoses and all orders for this visit:  Swelling of lower leg No red flags on exam. Suspect dependent edema. Update labs and trial lasix 20 mg prn. Follow-up with nephrology for further evaluation. Reviewed worsening precautions and DVT signs/symptoms. -     CBC with Differential/Platelet -     TSH -     Comprehensive metabolic panel -     POCT Urinalysis Dipstick (Automated)  Right hip pain; Chronic right-sided low back pain without sciatica Suspect arthritis/degenerative change. Will update films as it has been several years and this is more severe than ever. Tylenol #3 provided for patient for severe pain. If films unremarkable, will send to PT. -     DG Hip Unilat W OR W/O Pelvis 2-3 Views Right; Future -     DG Lumbar Spine Complete; Future  Other orders -     acetaminophen-codeine (TYLENOL #3) 300-30 MG tablet; Take 2 tablets by mouth every 8 (eight) hours as needed for severe pain. -     furosemide (LASIX) 20 MG tablet; Take 1 tablet (20 mg total) by mouth daily as needed for edema.  . Reviewed expectations re: course of current medical issues. . Discussed self-management of symptoms. . Outlined signs and symptoms indicating need for more acute intervention. . Patient verbalized understanding and all questions were answered. . See orders for this visit as documented in the electronic medical record. . Patient received an After-Visit Summary.  CMA or LPN served as scribe during this visit. History,  Physical, and Plan performed by medical provider. The above documentation has been reviewed and is accurate and complete.   Inda Coke, PA-C

## 2020-06-14 NOTE — Patient Instructions (Signed)
It was great to see you!  For your swelling -- We will update your labs today Start lasix 20 mg as needed Talk to [facility] to make sure there is nothing else that is needed  For your back/hip -- Tylenol with codeine has been sent Go get your xrays, I'll be in touch when they have returned  An order for an xray has been put in for you. To get your xray, you can walk in at the [facility] location without a scheduled appointment. The address is [address]. It is across the street from [place] is located in the basement.  Hours of operation are M-F 8:30am to 5:00pm. Please note that they are closed for lunch between 12:30 and 1:00pm.  Take care,  Inda Coke PA-C

## 2020-06-15 LAB — URINE CULTURE
MICRO NUMBER:: 10899435
Result:: NO GROWTH
SPECIMEN QUALITY:: ADEQUATE

## 2020-06-15 LAB — CBC WITH DIFFERENTIAL/PLATELET
Absolute Monocytes: 316 cells/uL (ref 200–950)
Basophils Absolute: 20 cells/uL (ref 0–200)
Basophils Relative: 0.7 %
Eosinophils Absolute: 199 cells/uL (ref 15–500)
Eosinophils Relative: 7.1 %
HCT: 36.7 % (ref 35.0–45.0)
Hemoglobin: 12.4 g/dL (ref 11.7–15.5)
Lymphs Abs: 1053 cells/uL (ref 850–3900)
MCH: 30.3 pg (ref 27.0–33.0)
MCHC: 33.8 g/dL (ref 32.0–36.0)
MCV: 89.7 fL (ref 80.0–100.0)
MPV: 11 fL (ref 7.5–12.5)
Monocytes Relative: 11.3 %
Neutro Abs: 1212 cells/uL — ABNORMAL LOW (ref 1500–7800)
Neutrophils Relative %: 43.3 %
Platelets: 172 10*3/uL (ref 140–400)
RBC: 4.09 10*6/uL (ref 3.80–5.10)
RDW: 12.1 % (ref 11.0–15.0)
Total Lymphocyte: 37.6 %
WBC: 2.8 10*3/uL — ABNORMAL LOW (ref 3.8–10.8)

## 2020-06-15 LAB — COMPREHENSIVE METABOLIC PANEL
AG Ratio: 1.7 (calc) (ref 1.0–2.5)
ALT: 27 U/L (ref 6–29)
AST: 24 U/L (ref 10–35)
Albumin: 4.3 g/dL (ref 3.6–5.1)
Alkaline phosphatase (APISO): 63 U/L (ref 37–153)
BUN/Creatinine Ratio: 14 (calc) (ref 6–22)
BUN: 20 mg/dL (ref 7–25)
CO2: 28 mmol/L (ref 20–32)
Calcium: 9.4 mg/dL (ref 8.6–10.4)
Chloride: 100 mmol/L (ref 98–110)
Creat: 1.44 mg/dL — ABNORMAL HIGH (ref 0.60–0.93)
Globulin: 2.5 g/dL (calc) (ref 1.9–3.7)
Glucose, Bld: 129 mg/dL — ABNORMAL HIGH (ref 65–99)
Potassium: 4.3 mmol/L (ref 3.5–5.3)
Sodium: 136 mmol/L (ref 135–146)
Total Bilirubin: 0.5 mg/dL (ref 0.2–1.2)
Total Protein: 6.8 g/dL (ref 6.1–8.1)

## 2020-06-15 LAB — TSH: TSH: 3.48 mIU/L (ref 0.40–4.50)

## 2020-06-16 ENCOUNTER — Telehealth: Payer: Self-pay | Admitting: *Deleted

## 2020-06-16 ENCOUNTER — Telehealth: Payer: Self-pay | Admitting: Physician Assistant

## 2020-06-16 ENCOUNTER — Other Ambulatory Visit: Payer: Self-pay | Admitting: Physician Assistant

## 2020-06-16 DIAGNOSIS — D709 Neutropenia, unspecified: Secondary | ICD-10-CM

## 2020-06-16 IMAGING — MG DIGITAL SCREENING BILATERAL MAMMOGRAM WITH TOMO AND CAD
8 series · 8 of 24 positions shown · non-contrast
Comparison: Previous exam(s).

CLINICAL DATA: Screening.

EXAM:
DIGITAL SCREENING BILATERAL MAMMOGRAM WITH TOMO AND CAD

[L CC synth-2D]
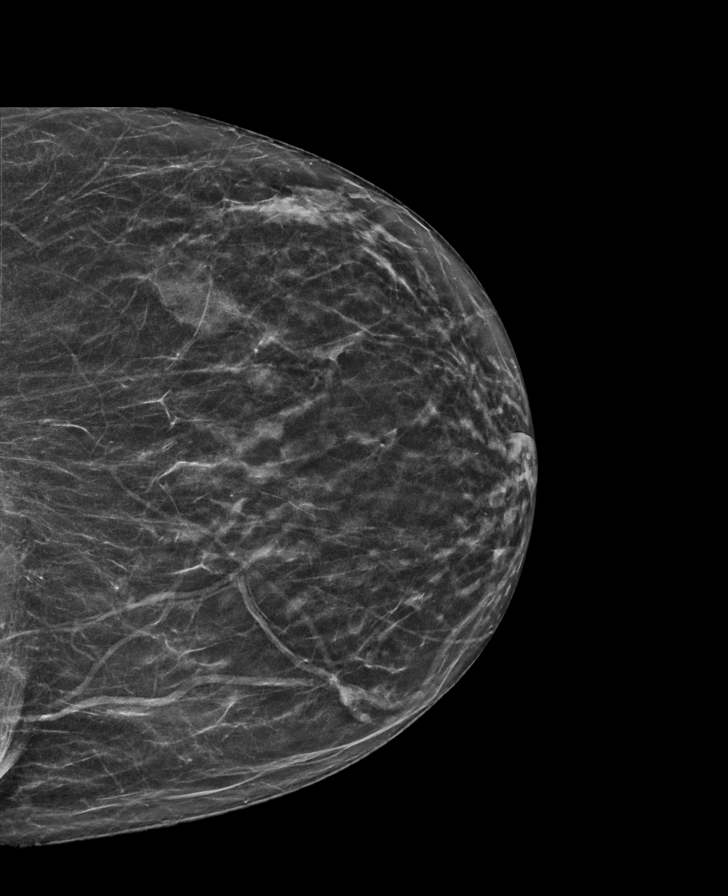

[R MLO synth-2D]
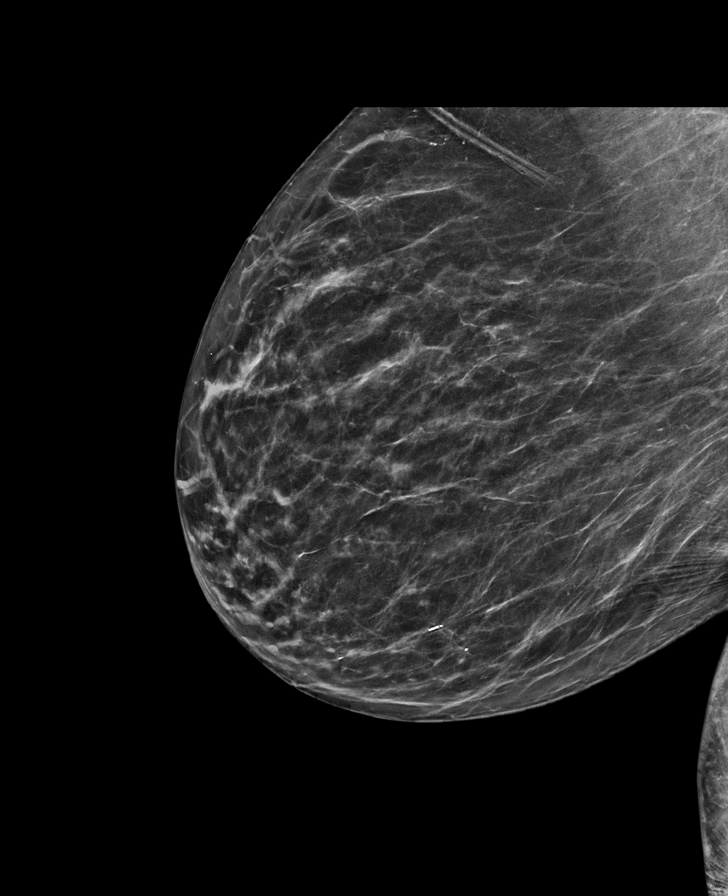

[R CC synth-2D]
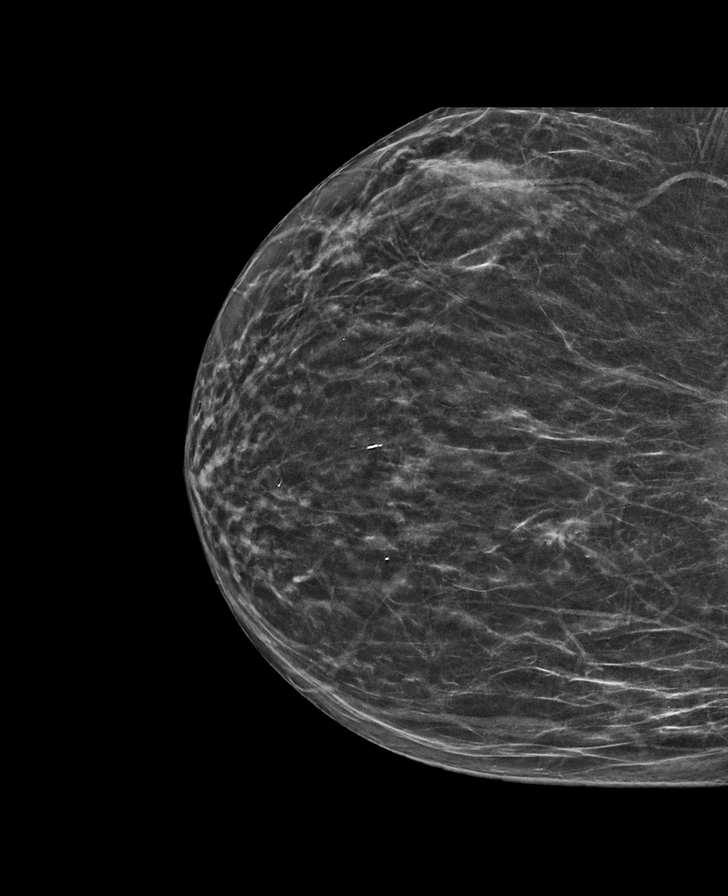

[L MLO synth-2D]
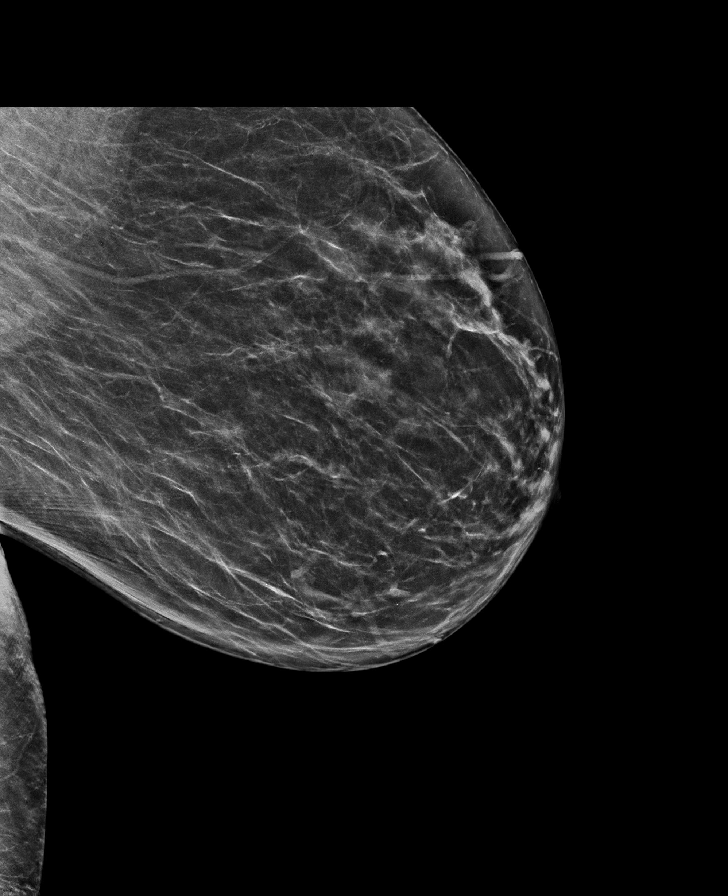

[R MLO tomo · tomo slice 35/69.0]
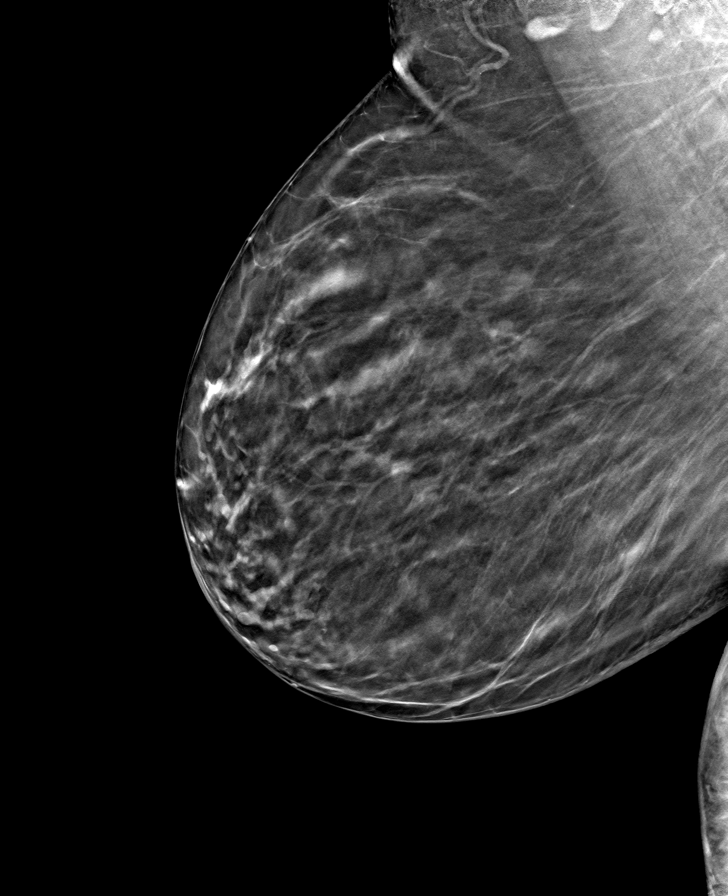

[L MLO tomo · tomo slice 37/72.0]
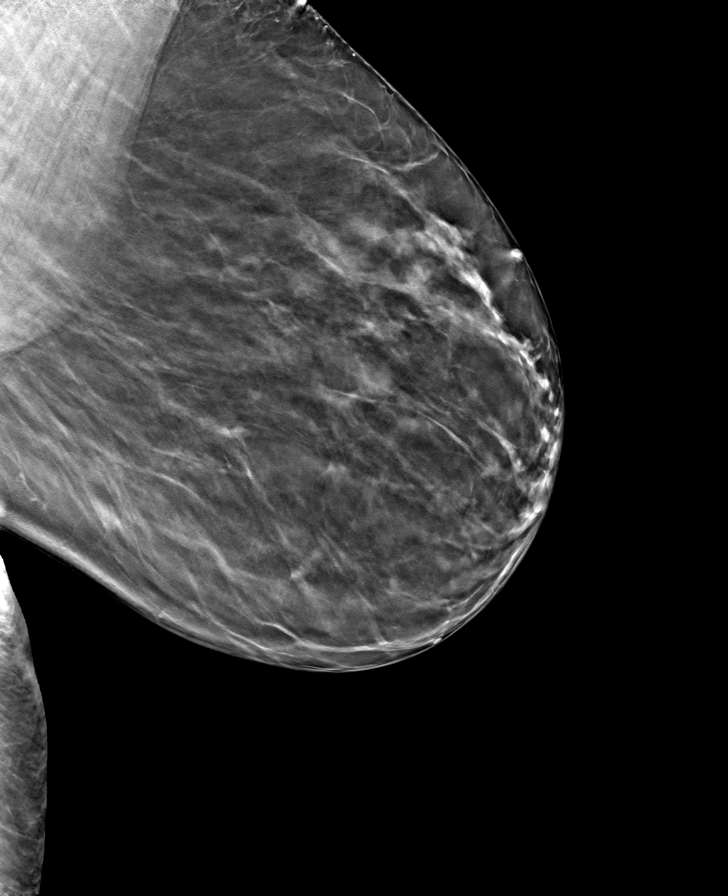

[R CC tomo · tomo slice 29/58.0]
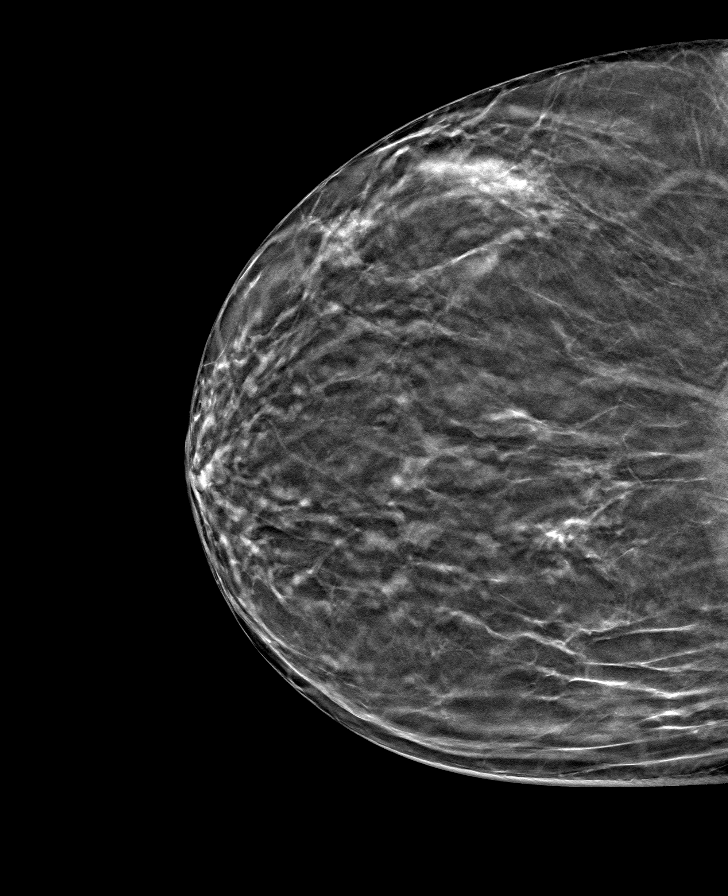

[L CC tomo · tomo slice 29/57.0]
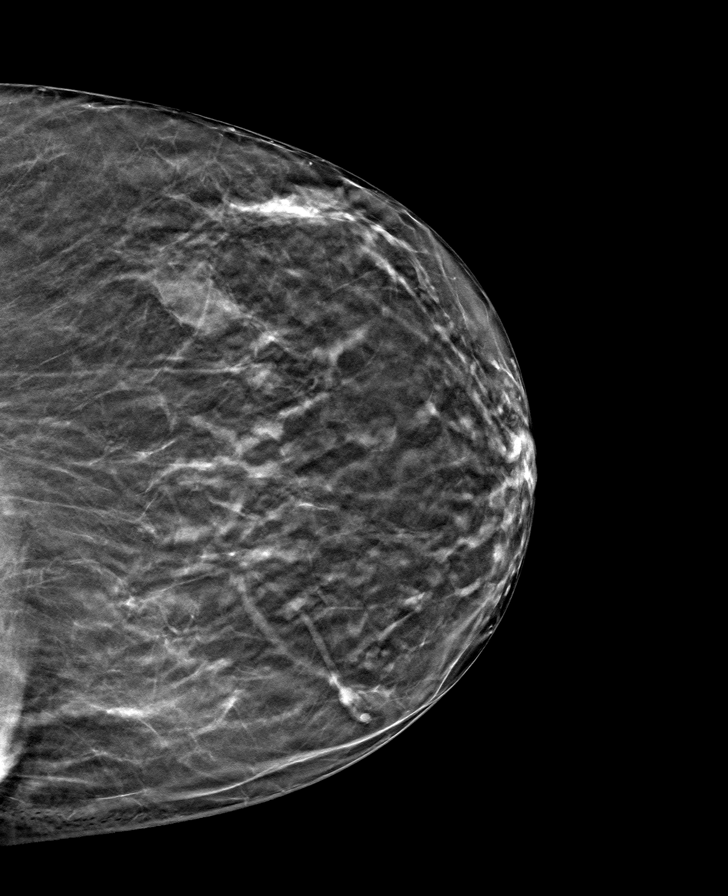

[8 of 24 positions shown; findings below may reference images not displayed]

ACR Breast Density Category b: There are scattered areas of
fibroglandular density.
FINDINGS: There are no findings suspicious for malignancy. Images were
processed with CAD.
IMPRESSION: No mammographic evidence of malignancy. A result letter of this
screening mammogram will be mailed directly to the patient.

RECOMMENDATION:
Screening mammogram in one year. (Code:[id number])

BI-RADS CATEGORY  1: Negative.

## 2020-06-16 NOTE — Telephone Encounter (Signed)
See result notes. 

## 2020-06-16 NOTE — Telephone Encounter (Signed)
I've already added future labs.  Catherine Mcconnell

## 2020-06-16 NOTE — Telephone Encounter (Signed)
Patient's daughter is calling back, asked for a returned call after 11.

## 2020-06-16 NOTE — Telephone Encounter (Signed)
Catherine Mcconnell, pt is coming for labs on Wed 9/8. What labs do you want?

## 2020-06-20 ENCOUNTER — Other Ambulatory Visit: Payer: Self-pay

## 2020-06-20 ENCOUNTER — Ambulatory Visit (INDEPENDENT_AMBULATORY_CARE_PROVIDER_SITE_OTHER)
Admission: RE | Admit: 2020-06-20 | Discharge: 2020-06-20 | Disposition: A | Discharge status: Home / Self Care | Payer: Medicare Other | Source: Ambulatory Visit | Attending: Physician Assistant | Admitting: Physician Assistant

## 2020-06-20 DIAGNOSIS — M25551 Pain in right hip: Secondary | ICD-10-CM

## 2020-06-20 DIAGNOSIS — M545 Low back pain, unspecified: Secondary | ICD-10-CM

## 2020-06-20 DIAGNOSIS — G8929 Other chronic pain: Secondary | ICD-10-CM

## 2020-06-21 ENCOUNTER — Other Ambulatory Visit: Payer: Self-pay | Admitting: *Deleted

## 2020-06-21 ENCOUNTER — Other Ambulatory Visit: Payer: Medicare Other

## 2020-06-21 ENCOUNTER — Other Ambulatory Visit: Payer: Self-pay

## 2020-06-21 DIAGNOSIS — D709 Neutropenia, unspecified: Secondary | ICD-10-CM

## 2020-06-21 DIAGNOSIS — M545 Low back pain, unspecified: Secondary | ICD-10-CM

## 2020-06-21 DIAGNOSIS — G8929 Other chronic pain: Secondary | ICD-10-CM

## 2020-06-21 NOTE — Progress Notes (Signed)
amb to  

## 2020-06-22 LAB — CBC WITH DIFFERENTIAL/PLATELET
Absolute Monocytes: 218 cells/uL (ref 200–950)
Basophils Absolute: 39 cells/uL (ref 0–200)
Basophils Relative: 1.5 %
Eosinophils Absolute: 190 cells/uL (ref 15–500)
Eosinophils Relative: 7.3 %
HCT: 35 % (ref 35.0–45.0)
Hemoglobin: 11.7 g/dL (ref 11.7–15.5)
Lymphs Abs: 985 cells/uL (ref 850–3900)
MCH: 30 pg (ref 27.0–33.0)
MCHC: 33.4 g/dL (ref 32.0–36.0)
MCV: 89.7 fL (ref 80.0–100.0)
MPV: 10.6 fL (ref 7.5–12.5)
Monocytes Relative: 8.4 %
Neutro Abs: 1167 cells/uL — ABNORMAL LOW (ref 1500–7800)
Neutrophils Relative %: 44.9 %
Platelets: 183 10*3/uL (ref 140–400)
RBC: 3.9 10*6/uL (ref 3.80–5.10)
RDW: 12 % (ref 11.0–15.0)
Total Lymphocyte: 37.9 %
WBC: 2.6 10*3/uL — ABNORMAL LOW (ref 3.8–10.8)

## 2020-06-22 LAB — PATHOLOGIST SMEAR REVIEW

## 2020-07-04 ENCOUNTER — Ambulatory Visit (INDEPENDENT_AMBULATORY_CARE_PROVIDER_SITE_OTHER): Payer: Medicare Other | Admitting: Physical Therapy

## 2020-07-04 ENCOUNTER — Encounter: Payer: Self-pay | Admitting: Physical Therapy

## 2020-07-04 ENCOUNTER — Other Ambulatory Visit: Payer: Self-pay

## 2020-07-04 DIAGNOSIS — M545 Low back pain, unspecified: Secondary | ICD-10-CM

## 2020-07-04 DIAGNOSIS — M25551 Pain in right hip: Secondary | ICD-10-CM

## 2020-07-05 NOTE — Patient Instructions (Signed)
Access Code: [email or web address] URL: [email or web address] Date: 07/05/2020 Prepared by: [name]  Exercises Supine Posterior Pelvic Tilt - 2 x daily - 2 sets - 10 reps Supine Single Knee to Chest Stretch - 2 x daily - 3 reps - 30 hold Supine Figure 4 Piriformis Stretch with Leg Extension - 2 x daily - 3 reps - 30 hold Supine Piriformis Stretch with Leg Straight - 2 x daily - 3 reps - 30 hold Sidelying Hip Abduction - 1 x daily - 2 sets - 10 reps

## 2020-07-05 NOTE — Therapy (Signed)
[facility] [address] [place], [place], [address] Phone: [phone]   Fax:  [phone]  Physical Therapy Evaluation  Patient Details  Name: Catherine Mcconnell MRN: [id number] Date of Birth: [birth year] Referring Provider (PT): Inda Coke   Encounter Date: 07/04/2020   PT End of Session - 07/04/20 1221    Visit Number 1    Number of Visits 12    Date for PT Re-Evaluation 08/15/20    Authorization Type UHC    PT Start Time 1216    PT Stop Time 1300    PT Time Calculation (min) 44 min    Activity Tolerance Patient tolerated treatment well    Behavior During Therapy [facility] for tasks assessed/performed           Past Medical History:  Diagnosis Date  . Arthritis   . Chronic kidney disease   . Diabetes mellitus without complication ([place])   . Hypertension   . Iron deficiency    on oral iron supplements, has not required transfusion  . Mixed hyperlipidemia   . OSA on CPAP    compliant  . Thyroid nodule    removed    Past Surgical History:  Procedure Laterality Date  . ABDOMINAL HYSTERECTOMY  1996  . MASS EXCISION Right 2017   right arm mass removal  . THYROID SURGERY     left side removal benign nodule    There were no vitals filed for this visit.    Subjective Assessment - 07/04/20 1218    Subjective Pt states increased pain in R hip and R low back, newer onset, no incident to report. Likes to walk outside.    Patient Stated Goals decreased pain    Currently in Pain? Yes    Pain Score 3     Pain Location Back    Pain Orientation Right    Pain Descriptors / Indicators Aching    Pain Type Acute pain    Pain Radiating Towards up to 5-6/ 10 with activity    Pain Onset More than a month ago    Pain Frequency Intermittent    Aggravating Factors  stairs, standing, walking,    Multiple Pain Sites Yes    Pain Score 3    Pain Location Hip    Pain Orientation Right    Pain Descriptors / Indicators Aching    Pain  Type Acute pain    Pain Radiating Towards up to 5-6/10 with activity    Pain Onset More than a month ago    Pain Frequency Intermittent    Aggravating Factors  standing, walking              [facility] PT Assessment - 07/05/20 0001      Assessment   Medical Diagnosis Low Back Pain/ R hip pain OA    Referring Provider (PT) Inda Coke    Prior Therapy no      Balance Screen   Has the patient fallen in the past 6 months No      Prior Function   Level of Independence Independent      Cognition   Overall Cognitive Status Within Functional Limits for tasks assessed      AROM   Lumbar Flexion mild limitation    Lumbar Extension mild limitation    Lumbar - Right Side Bend mod limitation    Lumbar - Left Side Bend wfl      Strength   Overall Strength Comments hips: 4-/5 to 4/5;  Core;       Palpation   Palpation comment R SI, into R glute, R gr troch; minimal lumbar pain                       Objective measurements completed on examination: See above findings.       [facility] Adult PT Treatment/Exercise - 07/05/20 0001      Exercises   Exercises Lumbar      Lumbar Exercises: Stretches   Single Knee to Chest Stretch 3 reps;30 seconds    Pelvic Tilt 20 reps    Piriformis Stretch 3 reps;30 seconds    Piriformis Stretch Limitations mod fig 4/supine and hip IR across body       Lumbar Exercises: Sidelying   Hip Abduction 10 reps;Both                  PT Education - 07/04/20 1221    Education Details PT POC, exam findings.    Person(s) Educated Patient    Methods Demonstration;Explanation;Verbal cues;Tactile cues;Handout    Comprehension Verbalized understanding;Returned demonstration;Verbal cues required;Need further instruction;Tactile cues required            PT Short Term Goals - 07/04/20 1256      PT SHORT TERM GOAL #1   Title Pt to be independent with initial HEP    Time 2    Period Weeks    Status New    Target Date 07/18/20              PT Long Term Goals - 07/04/20 1256      PT LONG TERM GOAL #1   Title Pt to be independent with final HEP    Time 6    Period Weeks    Status New    Target Date 08/15/20      PT LONG TERM GOAL #2   Title Pt to report decreased pain in R hip and back to 0-2/10 with standing and walking activity    Time 6    Period Weeks    Status New    Target Date 08/15/20      PT LONG TERM GOAL #3   Title Pt to demo improved hip strength to at least 4+/5 to improve stability, gait, and pain    Time 6    Period Weeks    Status New    Target Date 08/15/20      PT LONG TERM GOAL #4   Title Pt to demo soft tissue restrictions in R glute to be WNL , to improve pain and function    Time 6    Period Weeks    Status New    Target Date 08/15/20                  Plan - 07/05/20 0910    Clinical Impression Statement Pt presents with primary complaint of increased pain in R low back and R hip. Pt with mild ROM limitations, and strength deficits in hips, core, with lack of effective HEP. Pt with decreased ability for full functional activites due to pain and deficits. Increased difficulty with standing, walking activity. Pt to benefit from skilled PT to improve deficits and pain.    Personal Factors and Comorbidities Time since onset of injury/illness/exacerbation    Examination-Activity Limitations Locomotion Level;Bend;Squat;Stairs;Stand;Lift    Examination-Participation Restrictions Cleaning;Community Activity;Shop;Yard Work    Stability/Clinical Decision Making Stable/Uncomplicated    Designer, jewellery Low  Rehab Potential Good    PT Frequency 2x / week    PT Duration 6 weeks    PT Treatment/Interventions ADLs/Self Care Home Management;Electrical Stimulation;Cryotherapy;Iontophoresis 4mg /ml Dexamethasone;Moist Heat;Traction;DME Instruction;Gait training;Neuromuscular re-education;Balance training;Therapeutic exercise;Functional mobility training;Stair  training;Patient/family education;Manual techniques;Taping;Dry needling;Passive range of motion;Spinal Manipulations;Joint Manipulations    Consulted and Agree with Plan of Care Patient           Patient will benefit from skilled therapeutic intervention in order to improve the following deficits and impairments:  Abnormal gait, Decreased range of motion, Difficulty walking, Increased muscle spasms, Decreased activity tolerance, Pain, Impaired flexibility, Improper body mechanics, Decreased mobility, Decreased strength, Postural dysfunction  Visit Diagnosis: Acute right-sided low back pain without sciatica  Pain in right hip     Problem List Patient Active Problem List   Diagnosis Date Noted  . Type 2 diabetes mellitus with hyperglycemia, with long-term current use of insulin ([place]) 06/06/2020  . Memory deficits 03/16/2020  . Diabetes mellitus without complication ([place]) 07/30/5101  . OSA on CPAP   . Chronic kidney disease   . Hypertension   . Mixed hyperlipidemia     [name], PT, DPT 9:17 AM  07/05/20    [facility] [place], [place], [address] Phone: [phone]   Fax:  [phone]  Name: Clarrissa Shimkus MRN: [id number] Date of Birth: July 19, 1946

## 2020-07-06 ENCOUNTER — Ambulatory Visit (INDEPENDENT_AMBULATORY_CARE_PROVIDER_SITE_OTHER): Payer: Medicare Other | Admitting: Physical Therapy

## 2020-07-06 ENCOUNTER — Encounter: Payer: Self-pay | Admitting: Physical Therapy

## 2020-07-06 ENCOUNTER — Other Ambulatory Visit: Payer: Self-pay

## 2020-07-06 DIAGNOSIS — M25551 Pain in right hip: Secondary | ICD-10-CM

## 2020-07-06 DIAGNOSIS — M545 Low back pain, unspecified: Secondary | ICD-10-CM

## 2020-07-06 NOTE — Therapy (Signed)
[facility] [address] [place], [place], [address] Phone: [phone]   Fax:  [phone]  Physical Therapy Treatment  Patient Details  Name: Catherine Mcconnell MRN: [id number] Date of Birth: Feb 24, 1946 Referring Provider (PT): Inda Coke   Encounter Date: 07/06/2020   PT End of Session - 07/06/20 1113    Visit Number 2    Number of Visits 12    Date for PT Re-Evaluation 08/15/20    Authorization Type UHC    PT Start Time 1105    PT Stop Time 1143    PT Time Calculation (min) 38 min    Activity Tolerance Patient tolerated treatment well    Behavior During Therapy WFL for tasks assessed/performed           Past Medical History:  Diagnosis Date   Arthritis    Chronic kidney disease    Diabetes mellitus without complication ([place])    Hypertension    Iron deficiency    on oral iron supplements, has not required transfusion   Mixed hyperlipidemia    OSA on CPAP    compliant   Thyroid nodule    removed    Past Surgical History:  Procedure Laterality Date   [place] Right 2017   right arm mass removal   THYROID SURGERY     left side removal benign nodule    There were no vitals filed for this visit.   Subjective Assessment - 07/06/20 1113    Subjective Pt states mild pain in hip and back today. Has been doing HEP    Currently in Pain? Yes    Pain Score 3     Pain Location Back    Pain Orientation Right    Pain Descriptors / Indicators Aching    Pain Type Acute pain    Pain Onset More than a month ago    Pain Frequency Intermittent    Pain Score 3    Pain Location Hip    Pain Orientation Right    Pain Descriptors / Indicators Aching    Pain Type Acute pain    Pain Onset More than a month ago    Pain Frequency Intermittent                             [facility] Adult PT Treatment/Exercise - 07/06/20 0001      Exercises   Exercises Lumbar       Lumbar Exercises: Stretches   Active Hamstring Stretch 3 reps;30 seconds    Active Hamstring Stretch Limitations seated    Single Knee to Chest Stretch 3 reps;30 seconds    Pelvic Tilt 20 reps    Piriformis Stretch 3 reps;30 seconds    Piriformis Stretch Limitations seated      Lumbar Exercises: Aerobic   Recumbent Bike L1 x 8 min ;       Lumbar Exercises: Supine   Ab Set 15 reps    Clam 20 reps    Clam Limitations GTB    Bridge 20 reps      Lumbar Exercises: Sidelying   Hip Abduction 10 reps;Both      Manual Therapy   Manual Therapy Joint mobilization;Passive ROM;Manual Traction;Soft tissue mobilization    Manual therapy comments stm/roller to R hip/gr troch    Joint Mobilization R hip inf and post mobs     Manual Traction Long leg distraction on R,  for lumbar and hip pump x3 min                     PT Short Term Goals - 07/04/20 1256      PT SHORT TERM GOAL #1   Title Pt to be independent with initial HEP    Time 2    Period Weeks    Status New    Target Date 07/18/20             PT Long Term Goals - 07/04/20 1256      PT LONG TERM GOAL #1   Title Pt to be independent with final HEP    Time 6    Period Weeks    Status New    Target Date 08/15/20      PT LONG TERM GOAL #2   Title Pt to report decreased pain in R hip and back to 0-2/10 with standing and walking activity    Time 6    Period Weeks    Status New    Target Date 08/15/20      PT LONG TERM GOAL #3   Title Pt to demo improved hip strength to at least 4+/5 to improve stability, gait, and pain    Time 6    Period Weeks    Status New    Target Date 08/15/20      PT LONG TERM GOAL #4   Title Pt to demo soft tissue restrictions in R glute to be WNL , to improve pain and function    Time 6    Period Weeks    Status New    Target Date 08/15/20                 Plan - 07/06/20 1211    Clinical Impression Statement Ther ex progressed for stretching and strengthening  today, without increased pain. Pt with soreness to palpate lateral hip, but overall decreased pain today. Plan to progress strengthening as tolerated.    Personal Factors and Comorbidities Time since onset of injury/illness/exacerbation    Examination-Activity Limitations Locomotion Level;Bend;Squat;Stairs;Stand;Lift    Examination-Participation Restrictions Cleaning;Community Activity;Shop;Yard Work    Stability/Clinical Decision Making Stable/Uncomplicated    Rehab Potential Good    PT Frequency 2x / week    PT Duration 6 weeks    PT Treatment/Interventions ADLs/Self Care Home Management;Electrical Stimulation;Cryotherapy;Iontophoresis 4mg /ml Dexamethasone;Moist Heat;Traction;DME Instruction;Gait training;Neuromuscular re-education;Balance training;Therapeutic exercise;Functional mobility training;Stair training;Patient/family education;Manual techniques;Taping;Dry needling;Passive range of motion;Spinal Manipulations;Joint Manipulations    Consulted and Agree with Plan of Care Patient           Patient will benefit from skilled therapeutic intervention in order to improve the following deficits and impairments:  Abnormal gait, Decreased range of motion, Difficulty walking, Increased muscle spasms, Decreased activity tolerance, Pain, Impaired flexibility, Improper body mechanics, Decreased mobility, Decreased strength, Postural dysfunction  Visit Diagnosis: Acute right-sided low back pain without sciatica  Pain in right hip     Problem List Patient Active Problem List   Diagnosis Date Noted   Type 2 diabetes mellitus with hyperglycemia, with long-term current use of insulin ([place]) 06/06/2020   Memory deficits 03/16/2020   Diabetes mellitus without complication ([place]) 80/99/8338   OSA on CPAP    Chronic kidney disease    Hypertension    Mixed hyperlipidemia     [name], PT, DPT 12:12 PM  07/06/20    [facility] [address] [place], [place], [address]  Phone: [phone]   Fax:  [phone]  Name: Catherine Mcconnell MRN: [id number] Date of Birth: 04-27-1946

## 2020-07-11 ENCOUNTER — Ambulatory Visit (INDEPENDENT_AMBULATORY_CARE_PROVIDER_SITE_OTHER): Payer: Medicare Other | Admitting: Physical Therapy

## 2020-07-11 ENCOUNTER — Telehealth: Payer: Self-pay | Admitting: Cardiology

## 2020-07-11 ENCOUNTER — Other Ambulatory Visit: Payer: Self-pay

## 2020-07-11 ENCOUNTER — Other Ambulatory Visit: Payer: Self-pay | Admitting: Cardiology

## 2020-07-11 ENCOUNTER — Encounter: Payer: Self-pay | Admitting: Physical Therapy

## 2020-07-11 DIAGNOSIS — M25551 Pain in right hip: Secondary | ICD-10-CM

## 2020-07-11 DIAGNOSIS — I10 Essential (primary) hypertension: Secondary | ICD-10-CM

## 2020-07-11 DIAGNOSIS — M545 Low back pain, unspecified: Secondary | ICD-10-CM

## 2020-07-11 DIAGNOSIS — R600 Localized edema: Secondary | ICD-10-CM

## 2020-07-11 NOTE — Telephone Encounter (Signed)
ICD-10-CM   1. Essential hypertension  I10 PCV ECHOCARDIOGRAM COMPLETE  2. Bilateral leg edema  R60.0 PCV ECHOCARDIOGRAM COMPLETE    Echo ordered by Dr. [name].   Adrian Prows, MD, [facility] 07/11/2020, 11:21 AM Office: [phone]

## 2020-07-11 NOTE — Therapy (Signed)
[facility] [address] [place], [place], [address] Phone: [phone]   Fax:  [phone]  Physical Therapy Treatment  Patient Details  Name: Riot Barrick MRN: [id number] Date of Birth: Aug 21, 1946 Referring Provider (PT): Inda Coke   Encounter Date: 07/11/2020   PT End of Session - 07/11/20 1108    Visit Number 3    Number of Visits 12    Date for PT Re-Evaluation 08/15/20    Authorization Type U[employer]    PT Start Time 1059    PT Stop Time 1140    PT Time Calculation (min) 41 min    Activity Tolerance Patient tolerated treatment well    Behavior During Therapy WFL for tasks assessed/performed           Past Medical History:  Diagnosis Date   Arthritis    Chronic kidney disease    Diabetes mellitus without complication ([place])    Hypertension    Iron deficiency    on oral iron supplements, has not required transfusion   Mixed hyperlipidemia    OSA on CPAP    compliant   Thyroid nodule    removed    Past Surgical History:  Procedure Laterality Date   [place] Right 2017   right arm mass removal   THYROID SURGERY     left side removal benign nodule    There were no vitals filed for this visit.   Subjective Assessment - 07/11/20 1107    Subjective Pt states increased soreness today, in hip and back, achey .    Currently in Pain? Yes    Pain Score 8     Pain Location Hip    Pain Orientation Right    Pain Descriptors / Indicators Aching    Pain Type Acute pain    Pain Onset More than a month ago    Pain Frequency Intermittent                             [facility] Adult PT Treatment/Exercise - 07/11/20 1108      Exercises   Exercises Lumbar      Lumbar Exercises: Stretches   Active Hamstring Stretch 3 reps;30 seconds    Active Hamstring Stretch Limitations seated    Single Knee to Chest Stretch 3 reps;30 seconds    Pelvic Tilt 20 reps     Piriformis Stretch 3 reps;30 seconds    Piriformis Stretch Limitations seated      Lumbar Exercises: Aerobic   Recumbent Bike L2 x 7 min ;       Lumbar Exercises: Standing   Other Standing Lumbar Exercises hip abd and march x 20 ea bil;       Lumbar Exercises: Supine   Ab Set --    Clam 20 reps    Clam Limitations GTB    Bridge --    Bridge with clamshell 20 reps    Straight Leg Raise 15 reps    Straight Leg Raises Limitations bil      Lumbar Exercises: Sidelying   Hip Abduction Both;15 reps    Hip Abduction Limitations 3x5       Manual Therapy   Manual Therapy Joint mobilization;Passive ROM;Manual Traction;Soft tissue mobilization    Manual therapy comments DTM/IASTM to lateral hip and gr troch, ITB    Joint Mobilization --    Manual Traction Long leg distraction on R, for  lumbar and hip pump x3 min                     PT Short Term Goals - 07/04/20 1256      PT SHORT TERM GOAL #1   Title Pt to be independent with initial HEP    Time 2    Period Weeks    Status New    Target Date 07/18/20             PT Long Term Goals - 07/04/20 1256      PT LONG TERM GOAL #1   Title Pt to be independent with final HEP    Time 6    Period Weeks    Status New    Target Date 08/15/20      PT LONG TERM GOAL #2   Title Pt to report decreased pain in R hip and back to 0-2/10 with standing and walking activity    Time 6    Period Weeks    Status New    Target Date 08/15/20      PT LONG TERM GOAL #3   Title Pt to demo improved hip strength to at least 4+/5 to improve stability, gait, and pain    Time 6    Period Weeks    Status New    Target Date 08/15/20      PT LONG TERM GOAL #4   Title Pt to demo soft tissue restrictions in R glute to be WNL , to improve pain and function    Time 6    Period Weeks    Status New    Target Date 08/15/20                 Plan - 07/11/20 1144    Clinical Impression Statement Pt wtih increased soreness overall  today. Difficulty and soreness with hip abd in standing and s/l, but did well with other strengthening. Plan to continue manual as needed for pain, and progress strength.    Personal Factors and Comorbidities Time since onset of injury/illness/exacerbation    Examination-Activity Limitations Locomotion Level;Bend;Squat;Stairs;Stand;Lift    Examination-Participation Restrictions Cleaning;Community Activity;Shop;Yard Work    Stability/Clinical Decision Making Stable/Uncomplicated    Rehab Potential Good    PT Frequency 2x / week    PT Duration 6 weeks    PT Treatment/Interventions ADLs/Self Care Home Management;Electrical Stimulation;Cryotherapy;Iontophoresis 4mg /ml Dexamethasone;Moist Heat;Traction;DME Instruction;Gait training;Neuromuscular re-education;Balance training;Therapeutic exercise;Functional mobility training;Stair training;Patient/family education;Manual techniques;Taping;Dry needling;Passive range of motion;Spinal Manipulations;Joint Manipulations    Consulted and Agree with Plan of Care Patient           Patient will benefit from skilled therapeutic intervention in order to improve the following deficits and impairments:  Abnormal gait, Decreased range of motion, Difficulty walking, Increased muscle spasms, Decreased activity tolerance, Pain, Impaired flexibility, Improper body mechanics, Decreased mobility, Decreased strength, Postural dysfunction  Visit Diagnosis: Acute right-sided low back pain without sciatica  Pain in right hip     Problem List Patient Active Problem List   Diagnosis Date Noted   Type 2 diabetes mellitus with hyperglycemia, with long-term current use of insulin ([place]) 06/06/2020   Memory deficits 03/16/2020   Diabetes mellitus without complication ([place]) 17/40/8144   OSA on CPAP    Chronic kidney disease    Hypertension    Mixed hyperlipidemia     [name], PT, DPT 11:45 AM  07/11/20    [facility]  [place], [place], [address] Phone: [phone]   Fax:  [phone]  Name: Yecenia Dalgleish MRN: [id number] Date of Birth: 1946/04/27

## 2020-07-12 ENCOUNTER — Ambulatory Visit: Payer: Medicare Other

## 2020-07-12 DIAGNOSIS — I10 Essential (primary) hypertension: Secondary | ICD-10-CM

## 2020-07-12 DIAGNOSIS — R600 Localized edema: Secondary | ICD-10-CM

## 2020-07-17 ENCOUNTER — Encounter: Payer: Self-pay | Admitting: Adult Health

## 2020-07-17 ENCOUNTER — Ambulatory Visit (INDEPENDENT_AMBULATORY_CARE_PROVIDER_SITE_OTHER): Payer: Medicare Other | Admitting: Adult Health

## 2020-07-17 VITALS — BP 155/76 | HR 69 | Ht 66.0 in | Wt 161.0 lb

## 2020-07-17 DIAGNOSIS — R413 Other amnesia: Secondary | ICD-10-CM

## 2020-07-17 NOTE — Patient Instructions (Signed)
Your Plan:  Continue to monitor symptoms  Memory score is stable today 25/30 previously 24/30 If your symptoms worsen or you develop new symptoms please let us know.    Thank you for coming to see [place] at [facility]. I hope we have been able to provide you high quality care today.  You may receive a patient satisfaction survey over the next few weeks. We would appreciate your feedback and comments so that we may continue to improve ourselves and the health of our patients.

## 2020-07-17 NOTE — Progress Notes (Signed)
PATIENT: Catherine Mcconnell DOB: Oct 17, 1945  REASON FOR VISIT: follow up HISTORY FROM: patient  HISTORY OF PRESENT ILLNESS: Today 07/17/20:  Catherine Mcconnell  is a 74 year old female with a history of obstructive sleep apnea and memory disturbance.  She returns today for follow-up.  She reports that her memory has been relatively stable.  She does not feel that it is gotten any worse.  She lives with her daughter.  Her daughter was unable to attend this visit.  She states that on occasion she feels brain fog but this is inconsistent versus consistent.  She is able to complete all ADLs independently.  She does operate a Teacher, music.  Denies any trouble driving but she reports that she does use a GPS consistently to be safe.  She denies any troubles managing her finances.  Reports that she continues to cook meals without difficulty.  She is currently on no medication for her memory.  She continues to use the CPAP consistently.  HISTORY (Copied from Dr.Dohmeier's note) New problem for this established sleep patient, last seen by NP- Televisit.  Catherine Mcconnell is a 74 year- old african -[place] . female is seen on 03-16-2020 with her daughter. According to her daughter Catherine Mcconnell has forgotten to pay her phone bill which has never happened before, she has gotten lost driving in town on familiar roads.  There were no aggravating factors such as bad weather or nighttime driving.  These events have left Catherine Mcconnell a little bit shaken. Based on these concerns her physician assistant Catherine Mcconnell has sent the patient for follow-up.    The memory loss is accompanied by a rather poorly controlled diabetes still the HbA1c was 8.4 in April of this year, she has a past medical history of chronic kidney disease which is not graded, arthritis, heart iron deficiency, hypertension, mixed hyperlipidemia and she has a history of a thyroid nodule which was surgically removed. She has a past surgical history of has to  hysterectomy in 1996 thyroid surgery a biopsy revealed a benign nodule.  And she had a skin mass removed from her right upper arm in 2017 which  was not cancerous.   She is followed here in our sleep clinic for OSA obstructive sleep apnea on CPAP.  Just to stay for another visit she has been 100% compliant by days, 97% compliant by time average use at time of 6 hours 51 minutes, CPAP is set to 11 cm water pressure with 3 cm EPR her residual apnea-hypopnea index is 2.4/h very good she does have some central apneas arising but overall the main problem may be an air leak but not a lack of control of apnea.  95th percentile air leak was 55 L/min which means that the mask gets dislodged sometimes.  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    REVIEW OF SYSTEMS: Out of a complete 14 system review of symptoms, the patient complains only of the following symptoms, and all other reviewed systems are negative.  See HPI  ALLERGIES: Allergies  Allergen Reactions  . Lisinopril Rash  . Metformin And Related Swelling    HOME MEDICATIONS: Outpatient Medications Prior to Visit  Medication Sig Dispense Refill  . amLODipine (NORVASC) 10 MG tablet TAKE 1 TABLET BY MOUTH  DAILY 90 tablet 3  .  aspirin EC 81 MG tablet Take 81 mg by mouth daily.    Marland Kitchen atorvastatin (LIPITOR) 40 MG tablet TAKE 1 TABLET BY MOUTH  DAILY 90 tablet 3  . carvedilol (COREG) 6.25 MG tablet TAKE 1 TABLET BY MOUTH  TWICE DAILY WITH MEALS 180 tablet 3  . ferrous sulfate (IRON SUPPLEMENT) 325 (65 FE) MG tablet Take 1 tablet (325 mg total) by mouth 2 (two) times daily with a meal. (Patient taking differently: Take 325 mg by mouth daily. ) 60 tablet 2  . furosemide (LASIX) 20 MG tablet Take 1 tablet (20 mg total) by mouth daily as needed for edema. 30 tablet 1  .  gabapentin (NEURONTIN) 100 MG capsule TAKE 3 CAPSULES BY MOUTH  DAILY 270 capsule 3  . insulin degludec (TRESIBA FLEXTOUCH) 100 UNIT/ML FlexTouch Pen Inject 0.1 mLs (10 Units total) into the skin daily. 15 mL 3  . Insulin Pen Needle 32G X 4 MM MISC 1 each by Does not apply route as needed. 100 each 3  . ketoconazole (NIZORAL) 2 % cream Apply 1 fingertip amount to each foot daily. 30 g 0  . Lancets (ONETOUCH ULTRASOFT) lancets CHECK TWICE PER DAY BEFORE  BREAKFAST AND DINNER 200 each 4  . losartan (COZAAR) 50 MG tablet Take 50 mg by mouth daily.    Marland Kitchen LUMIGAN 0.01 % SOLN Place 1 drop into both eyes every evening.    Catherine Mcconnell ULTRA test strip USE TO TEST BLOOD SUGARS  TWICE DAILY 200 strip 3  . Semaglutide (RYBELSUS) 7 MG TABS Take 7 mg by mouth daily. 90 tablet 1  . acetaminophen-codeine (TYLENOL #3) 300-30 MG tablet Take 2 tablets by mouth every 8 (eight) hours as needed for severe pain. (Patient not taking: Reported on 07/17/2020) 30 tablet 0   No facility-administered medications prior to visit.    PAST MEDICAL HISTORY: Past Medical History:  Diagnosis Date  . Arthritis   . Chronic kidney disease   . Diabetes mellitus without complication ([place])   . Hypertension   . Iron deficiency    on oral iron supplements, has not required transfusion  . Mixed hyperlipidemia   . OSA on CPAP    compliant  . Thyroid nodule    removed    PAST SURGICAL HISTORY: Past Surgical History:  Procedure Laterality Date  . ABDOMINAL HYSTERECTOMY  1996  . MASS EXCISION Right 2017   right arm mass removal  . THYROID SURGERY     left side removal benign nodule    FAMILY HISTORY: Family History  Problem Relation Age of Onset  . Stroke Mother   . Hypertension Mother   . Heart disease Mother   . Heart attack Father   . Early death Father   . Hypertension Father   . Diabetes Sister   . Early death Sister   . Hypertension Sister   . Stroke Sister   . Prostate cancer Son   . Cancer Son   . Early  death Son   . Breast cancer Cousin   . Diabetes Sister   . Early death Sister   . Hyperlipidemia Sister   . Kidney disease Sister   . Depression Daughter   . Hypertension Daughter   . Hyperlipidemia Daughter   . Diabetes Sister   . Hyperlipidemia Sister   . Hypertension Sister   . Early death Brother   . Arthritis Brother   . Heart attack Brother   . Heart disease Brother   . Arthritis Brother   .  Diabetes Sister     SOCIAL HISTORY: Social History   Socioeconomic History  . Marital status: Divorced    Spouse name: Not on file  . Number of children: 6  . Years of education: some college  . Highest education level: Not on file  Occupational History  . Occupation: Retired   Tobacco Use  . Smoking status: Former Research scientist (life sciences)  . Smokeless tobacco: Never Used  . Tobacco comment: only socially in high school  Vaping Use  . Vaping Use: Never used  Substance and Sexual Activity  . Alcohol use: Not Currently  . Drug use: Never  . Sexual activity: Not on file  Other Topics Concern  . Not on file  Social History Narrative   03/16/20 lives with dgtr Catherine Mcconnell   Mother of 6   --Two children have passed, one from from prostate cancer and one from liver failure   Used to be a CNA, Child psychotherapist   Recently moved back to [facility]   Financial Resource Strain:   . Difficulty of Paying Living Expenses: Not on file  Food Insecurity:   . Worried About Charity fundraiser in the Last Year: Not on file  . Ran Out of Food in the Last Year: Not on file  Transportation Needs:   . Lack of Transportation (Medical): Not on file  . Lack of Transportation (Non-Medical): Not on file  Physical Activity:   . Days of Exercise per Week: Not on file  . Minutes of Exercise per Session: Not on file  Stress:   . Feeling of Stress : Not on file  Social Connections:   . Frequency of Communication with Friends and Family: Not on file  . Frequency of Social Gatherings with Friends  and Family: Not on file  . Attends Religious Services: Not on file  . Active Member of Clubs or Organizations: Not on file  . Attends Archivist Meetings: Not on file  . Marital Status: Not on file  Intimate Partner Violence:   . Fear of Current or Ex-Partner: Not on file  . Emotionally Abused: Not on file  . Physically Abused: Not on file  . Sexually Abused: Not on file      PHYSICAL EXAM  Vitals:   07/17/20 0851  BP: (!) 155/76  Pulse: 69  Weight: 161 lb (73 kg)  Height: 5\' 6"  (1.676 m)   Body mass index is 25.99 kg/m.   Montreal Cognitive Assessment  07/17/2020 03/16/2020  Visuospatial/ Executive (0/5) 5 4  Naming (0/3) 3 3  Attention: Read list of digits (0/2) 2 1  Attention: Read list of letters (0/1) 1 1  Attention: Serial 7 subtraction starting at 100 (0/3) 2 3  Language: Repeat phrase (0/2) 2 2  Language : Fluency (0/1) 0 1  Abstraction (0/2) 2 2  Delayed Recall (0/5) 2 1  Orientation (0/6) 6 6  Total 25 24  Adjusted Score (based on education) 25 -    Generalized: Well developed, in no acute distress   Neurological examination  Mentation: Alert oriented to time, place, history taking. Follows all commands speech and language fluent Cranial nerve II-XII: Pupils were equal round reactive to light. Extraocular movements were full, visual field were full on confrontational test. Facial sensation and strength were normal. Uvula tongue midline. Head turning and shoulder shrug  were normal and symmetric. Motor: The motor testing reveals 5 over 5 strength of all 4 extremities. Good symmetric motor tone is  noted throughout.  Sensory: Sensory testing is intact to soft touch on all 4 extremities. No evidence of extinction is noted.  Coordination: Cerebellar testing reveals good finger-nose-finger and heel-to-shin bilaterally.  Gait and station: Gait is normal. Tandem gait is normal. Romberg is negative. No drift is seen.  Reflexes: Deep tendon reflexes are  symmetric and normal bilaterally.   DIAGNOSTIC DATA (LABS, IMAGING, TESTING) - I reviewed patient records, labs, notes, testing and imaging myself where available.  Lab Results  Component Value Date   WBC 2.6 (L) 06/21/2020   HGB 11.7 06/21/2020   HCT 35.0 06/21/2020   MCV 89.7 06/21/2020   PLT 183 06/21/2020      Component Value Date/Time   NA 136 06/14/2020 1353   NA 136 (A) 03/30/2020 0000   K 4.3 06/14/2020 1353   CL 100 06/14/2020 1353   CO2 28 06/14/2020 1353   GLUCOSE 129 (H) 06/14/2020 1353   BUN 20 06/14/2020 1353   BUN 23 (A) 03/30/2020 0000   CREATININE 1.44 (H) 06/14/2020 1353   CALCIUM 9.4 06/14/2020 1353   PROT 6.8 06/14/2020 1353   ALBUMIN 4.6 08/05/2019 0000   AST 24 06/14/2020 1353   ALT 27 06/14/2020 1353   ALKPHOS 57 08/13/2018 0916   BILITOT 0.5 06/14/2020 1353   GFRNONAA 29 03/30/2020 0000   GFRAA 34 03/30/2020 0000   Lab Results  Component Value Date   CHOL 217 (H) 06/06/2020   HDL 61 06/06/2020   LDLCALC 134 (H) 06/06/2020   TRIG 108 06/06/2020   CHOLHDL 3.6 06/06/2020   Lab Results  Component Value Date   HGBA1C 10.6 (A) 04/21/2020   Lab Results  Component Value Date   [id number] 098 02/11/2020   Lab Results  Component Value Date   TSH 3.48 06/14/2020      ASSESSMENT AND PLAN 74 y.o. year old female  has a past medical history of Arthritis, Chronic kidney disease, Diabetes mellitus without complication ([place]), Hypertension, Iron deficiency, Mixed hyperlipidemia, OSA on CPAP, and Thyroid nodule. here with:  1.  Memory disturbance   Memory score stable MOCA 25/30 previously24/30  For now we will continue to monitor  Advised patient if her symptoms worsen or she develops new symptoms she should let us know  2.  Sleep apnea on CPAP   Encourage patient to continue using the CPAP nightly and greater than 4 hours each night  Follow-up in 6 months or sooner if needed  I spent 74 year old white female I done an email that said  that it was on back order they are waiting for them to come in yet that although I guess yeah he is keeping the entry, by my office today to get this computer so I asked him about your coronary minutes of face-to-face and non-face-to-face time with patient.  This included previsit chart review, lab review, study review, order entry, electronic health record documentation, patient education.  Ward Givens, MSN, NP-C 07/17/2020, 9:05 AM [facility] [address] [place], [place] [address] [phone]

## 2020-07-18 ENCOUNTER — Ambulatory Visit (INDEPENDENT_AMBULATORY_CARE_PROVIDER_SITE_OTHER): Payer: Medicare Other | Admitting: Physical Therapy

## 2020-07-18 ENCOUNTER — Other Ambulatory Visit: Payer: Self-pay

## 2020-07-18 ENCOUNTER — Encounter: Payer: Self-pay | Admitting: Physical Therapy

## 2020-07-18 DIAGNOSIS — M545 Low back pain, unspecified: Secondary | ICD-10-CM | POA: Diagnosis not present

## 2020-07-18 DIAGNOSIS — M25551 Pain in right hip: Secondary | ICD-10-CM | POA: Diagnosis not present

## 2020-07-18 NOTE — Therapy (Signed)
[facility] [address] [place], [place], [address] Phone: [phone]   Fax:  [phone]  Physical Therapy Treatment  Patient Details  Name: Catherine Mcconnell MRN: [id number] Date of Birth: 1945/11/19 Referring Provider (PT): Inda Coke   Encounter Date: 07/18/2020   PT End of Session - 07/18/20 1114    Visit Number 4    Number of Visits 12    Date for PT Re-Evaluation 08/15/20    Authorization Type UHC    PT Start Time 1105    PT Stop Time 1145    PT Time Calculation (min) 40 min    Activity Tolerance Patient tolerated treatment well    Behavior During Therapy [facility] for tasks assessed/performed           Past Medical History:  Diagnosis Date  . Arthritis   . Chronic kidney disease   . Diabetes mellitus without complication ([place])   . Hypertension   . Iron deficiency    on oral iron supplements, has not required transfusion  . Mixed hyperlipidemia   . OSA on CPAP    compliant  . Thyroid nodule    removed    Past Surgical History:  Procedure Laterality Date  . ABDOMINAL HYSTERECTOMY  1996  . MASS EXCISION Right 2017   right arm mass removal  . THYROID SURGERY     left side removal benign nodule    There were no vitals filed for this visit.   Subjective Assessment - 07/18/20 1107    Subjective Pt states increased soreness in last couple days. Has been doing HEP "some". States relief from our sessions for about 1 day.    Currently in Pain? Yes    Pain Score 7     Pain Location Hip    Pain Orientation Right    Pain Descriptors / Indicators Aching    Pain Type Acute pain    Pain Onset More than a month ago    Pain Frequency Intermittent                             [facility] Adult PT Treatment/Exercise - 07/18/20 0001      Exercises   Exercises Lumbar      Lumbar Exercises: Stretches   Active Hamstring Stretch 3 reps;30 seconds    Active Hamstring Stretch Limitations seated    Single  Knee to Chest Stretch 3 reps;30 seconds    Pelvic Tilt --    Piriformis Stretch 3 reps;30 seconds    Piriformis Stretch Limitations seated      Lumbar Exercises: Aerobic   Recumbent Bike L2 x 8 min ;       Lumbar Exercises: Standing   Other Standing Lumbar Exercises hip abd and march x 20 ea bil;       Lumbar Exercises: Supine   Clam 20 reps    Clam Limitations GTB    Bridge 20 reps    Bridge with clamshell --    Straight Leg Raise 15 reps    Straight Leg Raises Limitations bil      Lumbar Exercises: Sidelying   Hip Abduction Both;15 reps    Hip Abduction Limitations --      Manual Therapy   Manual Therapy Joint mobilization;Passive ROM;Manual Traction;Soft tissue mobilization    Manual therapy comments DTM/IASTM to lateral hip and gr troch, ITB    Manual Traction Long leg distraction on R, for lumbar and hip  pump x3 min                   PT Education - 07/18/20 1135    Education Details HEP updated    Person(s) Educated Patient    Methods Explanation;Demonstration;Tactile cues;Verbal cues;Handout    Comprehension Verbalized understanding;Returned demonstration;Verbal cues required;Tactile cues required;Need further instruction            PT Short Term Goals - 07/18/20 1114      PT SHORT TERM GOAL #1   Title Pt to be independent with initial HEP    Time 2    Period Weeks    Status Achieved    Target Date 07/18/20             PT Long Term Goals - 07/04/20 1256      PT LONG TERM GOAL #1   Title Pt to be independent with final HEP    Time 6    Period Weeks    Status New    Target Date 08/15/20      PT LONG TERM GOAL #2   Title Pt to report decreased pain in R hip and back to 0-2/10 with standing and walking activity    Time 6    Period Weeks    Status New    Target Date 08/15/20      PT LONG TERM GOAL #3   Title Pt to demo improved hip strength to at least 4+/5 to improve stability, gait, and pain    Time 6    Period Weeks    Status New     Target Date 08/15/20      PT LONG TERM GOAL #4   Title Pt to demo soft tissue restrictions in R glute to be WNL , to improve pain and function    Time 6    Period Weeks    Status New    Target Date 08/15/20                 Plan - 07/18/20 1146    Clinical Impression Statement Pt continues to have soreness. She is challenged with ther ex due to weakness, but has been able to prorgress strengthening. HEP reviewed in detail today, recommended increasing frequency.    Personal Factors and Comorbidities Time since onset of injury/illness/exacerbation    Examination-Activity Limitations Locomotion Level;Bend;Squat;Stairs;Stand;Lift    Examination-Participation Restrictions Cleaning;Community Activity;Shop;Yard Work    Stability/Clinical Decision Making Stable/Uncomplicated    Rehab Potential Good    PT Frequency 2x / week    PT Duration 6 weeks    PT Treatment/Interventions ADLs/Self Care Home Management;Electrical Stimulation;Cryotherapy;Iontophoresis 4mg /ml Dexamethasone;Moist Heat;Traction;DME Instruction;Gait training;Neuromuscular re-education;Balance training;Therapeutic exercise;Functional mobility training;Stair training;Patient/family education;Manual techniques;Taping;Dry needling;Passive range of motion;Spinal Manipulations;Joint Manipulations    Consulted and Agree with Plan of Care Patient           Patient will benefit from skilled therapeutic intervention in order to improve the following deficits and impairments:  Abnormal gait, Decreased range of motion, Difficulty walking, Increased muscle spasms, Decreased activity tolerance, Pain, Impaired flexibility, Improper body mechanics, Decreased mobility, Decreased strength, Postural dysfunction  Visit Diagnosis: Acute right-sided low back pain without sciatica  Pain in right hip     Problem List Patient Active Problem List   Diagnosis Date Noted  . Type 2 diabetes mellitus with hyperglycemia, with long-term  current use of insulin ([place]) 06/06/2020  . Memory deficits 03/16/2020  . Diabetes mellitus without complication ([place]) 16/07/9603  . OSA on CPAP   .  Chronic kidney disease   . Hypertension   . Mixed hyperlipidemia     [name], PT, DPT 11:48 AM  07/18/20    [facility] [place], [place], [address] Phone: [phone]   Fax:  [phone]  Name: Ramanda Paules MRN: [id number] Date of Birth: 02-21-1946

## 2020-07-24 ENCOUNTER — Encounter: Payer: Self-pay | Admitting: Physician Assistant

## 2020-07-24 ENCOUNTER — Other Ambulatory Visit: Payer: Self-pay

## 2020-07-24 ENCOUNTER — Ambulatory Visit (INDEPENDENT_AMBULATORY_CARE_PROVIDER_SITE_OTHER): Payer: Medicare Other | Admitting: Physician Assistant

## 2020-07-24 VITALS — BP 142/78 | HR 62 | Temp 97.9°F | Ht 66.0 in | Wt 163.0 lb

## 2020-07-24 DIAGNOSIS — M25551 Pain in right hip: Secondary | ICD-10-CM

## 2020-07-24 DIAGNOSIS — Z23 Encounter for immunization: Secondary | ICD-10-CM | POA: Diagnosis not present

## 2020-07-24 DIAGNOSIS — G8929 Other chronic pain: Secondary | ICD-10-CM

## 2020-07-24 DIAGNOSIS — M545 Low back pain, unspecified: Secondary | ICD-10-CM

## 2020-07-24 NOTE — Addendum Note (Signed)
Addended by: Marian Sorrow on: 07/24/2020 12:01 PM   Modules accepted: Orders

## 2020-07-24 NOTE — Progress Notes (Signed)
Catherine Mcconnell is a 74 y.o. female is here for follow up.  I acted as a Education administrator for [employer], PA-C Anselmo Pickler, LPN   History of Present Illness:   Chief Complaint  Patient presents with  . Back Pain    HPI   Back pain Pt following up on right lower back pain radiates into hip. Pt says it is mainly right lower pain. Pt is using pain medication prn with relief. She is continuing to work well with [name], PT.  For her pain, she will take tramadol for moderate symptoms.  Then, she will take tylenol #3 for severe symptoms. Takes this currently 1-2 times per week.  She has history of getting a back/hip injection about 15 years ago. She did really well with this and is interested in getting another if possible.   Health Maintenance Due  Topic Date Due  . COLONOSCOPY  Never done  . PNA vac Low Risk Adult (2 of 2 - PCV13) 11/14/2019    Past Medical History:  Diagnosis Date  . Arthritis   . Chronic kidney disease   . Diabetes mellitus without complication ([place])   . Hypertension   . Iron deficiency    on oral iron supplements, has not required transfusion  . Mixed hyperlipidemia   . OSA on CPAP    compliant  . Thyroid nodule    removed     Social History   Tobacco Use  . Smoking status: Former Research scientist (life sciences)  . Smokeless tobacco: Never Used  . Tobacco comment: only socially in high school  Vaping Use  . Vaping Use: Never used  Substance Use Topics  . Alcohol use: Not Currently  . Drug use: Never    Past Surgical History:  Procedure Laterality Date  . ABDOMINAL HYSTERECTOMY  1996  . MASS EXCISION Right 2017   right arm mass removal  . THYROID SURGERY     left side removal benign nodule    Family History  Problem Relation Age of Onset  . Stroke Mother   . Hypertension Mother   . Heart disease Mother   . Heart attack Father   . Early death Father   . Hypertension Father   . Diabetes Sister   . Early death Sister   . Hypertension Sister    . Stroke Sister   . Prostate cancer Son   . Cancer Son   . Early death Son   . Breast cancer Cousin   . Diabetes Sister   . Early death Sister   . Hyperlipidemia Sister   . Kidney disease Sister   . Depression Daughter   . Hypertension Daughter   . Hyperlipidemia Daughter   . Diabetes Sister   . Hyperlipidemia Sister   . Hypertension Sister   . Early death Brother   . Arthritis Brother   . Heart attack Brother   . Heart disease Brother   . Arthritis Brother   . Diabetes Sister     PMHx, SurgHx, SocialHx, FamHx, Medications, and Allergies were reviewed in the Visit Navigator and updated as appropriate.   Patient Active Problem List   Diagnosis Date Noted  . Type 2 diabetes mellitus with hyperglycemia, with long-term current use of insulin ([place]) 06/06/2020  . Memory deficits 03/16/2020  . Diabetes mellitus without complication ([place]) 27/78/2423  . OSA on CPAP   . Chronic kidney disease   . Hypertension   . Mixed hyperlipidemia     Social History   Tobacco  Use  . Smoking status: Former Smoker  . Smokeless tobacco: Never Used  . Tobacco comment: only socially in high school  Vaping Use  . Vaping Use: Never used  Substance Use Topics  . Alcohol use: Not Currently  . Drug use: Never    Current Medications and Allergies:    Current Outpatient Medications:  .  acetaminophen-codeine (TYLENOL #3) 300-30 MG tablet, Take 2 tablets by mouth every 8 (eight) hours as needed for severe pain., Disp: 30 tablet, Rfl: 0 .  amLODipine (NORVASC) 10 MG tablet, TAKE 1 TABLET BY MOUTH  DAILY, Disp: 90 tablet, Rfl: 3 .  aspirin EC 81 MG tablet, Take 81 mg by mouth daily., Disp: , Rfl:  .  atorvastatin (LIPITOR) 40 MG tablet, TAKE 1 TABLET BY MOUTH  DAILY, Disp: 90 tablet, Rfl: 3 .  carvedilol (COREG) 6.25 MG tablet, TAKE 1 TABLET BY MOUTH  TWICE DAILY WITH MEALS, Disp: 180 tablet, Rfl: 3 .  ferrous sulfate (IRON SUPPLEMENT) 325 (65 FE) MG tablet, Take 1 tablet (325 mg total) by mouth  2 (two) times daily with a meal. (Patient taking differently: Take 325 mg by mouth daily. ), Disp: 60 tablet, Rfl: 2 .  furosemide (LASIX) 20 MG tablet, Take 1 tablet (20 mg total) by mouth daily as needed for edema., Disp: 30 tablet, Rfl: 1 .  gabapentin (NEURONTIN) 100 MG capsule, TAKE 3 CAPSULES BY MOUTH  DAILY, Disp: 270 capsule, Rfl: 3 .  insulin degludec (TRESIBA FLEXTOUCH) 100 UNIT/ML FlexTouch Pen, Inject 0.1 mLs (10 Units total) into the skin daily., Disp: 15 mL, Rfl: 3 .  Insulin Pen Needle 32G X 4 MM MISC, 1 each by Does not apply route as needed., Disp: 100 each, Rfl: 3 .  ketoconazole (NIZORAL) 2 % cream, Apply 1 fingertip amount to each foot daily., Disp: 30 g, Rfl: 0 .  Lancets (ONETOUCH ULTRASOFT) lancets, CHECK TWICE PER DAY BEFORE  BREAKFAST AND DINNER, Disp: 200 each, Rfl: 4 .  losartan (COZAAR) 50 MG tablet, Take 50 mg by mouth daily., Disp: , Rfl:  .  LUMIGAN 0.01 % SOLN, Place 1 drop into both eyes every evening., Disp: , Rfl:  .  ONETOUCH ULTRA test strip, USE TO TEST BLOOD SUGARS  TWICE DAILY, Disp: 200 strip, Rfl: 3 .  Semaglutide (RYBELSUS) 7 MG TABS, Take 7 mg by mouth daily., Disp: 90 tablet, Rfl: 1   Allergies  Allergen Reactions  . Lisinopril Rash  . Metformin And Related Swelling    Review of Systems   ROS  Negative unless otherwise specified per HPI.  Vitals:   Vitals:   07/24/20 1049  BP: (!) 142/78  Pulse: 62  Temp: 97.9 F (36.6 C)  TempSrc: Temporal  SpO2: 98%  Weight: 163 lb (73.9 kg)  Height: 5\' 6"  (1.676 m)     Body mass index is 26.31 kg/m.   Physical Exam:    Physical Exam Vitals and nursing note reviewed.  Constitutional:      General: She is not in acute distress.    Appearance: She is well-developed. She is not ill-appearing or toxic-appearing.  Cardiovascular:     Rate and Rhythm: Normal rate and regular rhythm.     Pulses: Normal pulses.     Heart sounds: Normal heart sounds, S1 normal and S2 normal.     Comments: No LE  edema Pulmonary:     Effort: Pulmonary effort is normal.     Breath sounds: Normal breath sounds.  Skin:  General: Skin is warm and dry.  Neurological:     Mental Status: She is alert.     GCS: GCS eye subscore is 4. GCS verbal subscore is 5. GCS motor subscore is 6.  Psychiatric:        Speech: Speech normal.        Behavior: Behavior normal. Behavior is cooperative.      Assessment and Plan:    Keyonni was seen today for back pain.  Diagnoses and all orders for this visit:  Bilateral low back pain without sciatica, unspecified chronicity -     Ambulatory referral to Sports Medicine  Chronic hip pain, right -     Ambulatory referral to Sports Medicine   Referral to sports medicine for further evaluation and management. Continue PT. Declines medication refills today.  CMA or LPN served as scribe during this visit. History, Physical, and Plan performed by medical provider. The above documentation has been reviewed and is accurate and complete.  Inda Coke, PA-C [place] [place] 07/24/2020  Follow-up: No follow-ups on file.

## 2020-07-24 NOTE — Patient Instructions (Signed)
It was great to see you!  Continue current plan.  A referral has been placed for you to see Dr. Lynne Leader with [facility]. Someone from there office will be in touch soon regarding your appointment with him. His location: [place] at [facility] [address] on the 1st floor.   Phone number [phone], Fax [phone].  This location is across the street from the entrance to [employer] and in the same complex as the [facility] and [employer] bank   Take care,  Inda Coke PA-C

## 2020-07-25 ENCOUNTER — Encounter: Payer: Medicare Other | Admitting: Physical Therapy

## 2020-07-26 ENCOUNTER — Other Ambulatory Visit: Payer: Self-pay

## 2020-07-26 ENCOUNTER — Ambulatory Visit: Payer: Medicare Other | Admitting: Family Medicine

## 2020-07-26 ENCOUNTER — Encounter: Payer: Self-pay | Admitting: Family Medicine

## 2020-07-26 VITALS — BP 150/86 | HR 65 | Ht 66.0 in | Wt 164.6 lb

## 2020-07-26 DIAGNOSIS — M545 Low back pain, unspecified: Secondary | ICD-10-CM | POA: Diagnosis not present

## 2020-07-26 DIAGNOSIS — M7061 Trochanteric bursitis, right hip: Secondary | ICD-10-CM

## 2020-07-26 NOTE — Progress Notes (Signed)
Subjective:    CC: Low back and R hip pain  I, Molly Weber, LAT, ATC, am serving as scribe for Dr. Lynne Leader.  HPI: Pt is a 74 y/o female presenting w/ c/o chronic LBP and R lateral hip pain.  She has been attending PT w/ [name] at [facility].  She locates her pain to her R lower back/buttocks/SIJ.  Radiating pain: No LE numbness/tingling: No LE weakness: No Aggravating factors: walking; climbing stairs Treatments tried: Tramadol and Tylenol #3; PT;   Diagnostic imaging: L-spine and R hip XR- 06/20/20  Pertinent review of Systems: no fever or chills  Relevant historical information: HTN, OSA, DM2, CKD, memory deficit.    Objective:    Vitals:   07/26/20 1000  BP: (!) 150/86  Pulse: 65  SpO2: 97%   General: Well Developed, well nourished, and in no acute distress.   MSK: Lspine: Normal appearing but standing with weight shifted to left leg.  ROM: Normal flexion and extension. Limited rotation and lateral flexion.  Lower extremity strength sensation and reflexes are equal normal throughout bilateral extremities except noted below. Right hip normal-appearing normal motion.  Tender palpation greater trochanter and at origin of gluteus medius near iliac crest. Hip abduction strength is diminished 4/5 with pain.  External rotation strength is also diminished 4/5 without much pain.   Lab and Radiology Results DG Lumbar Spine Complete  Result Date: 06/20/2020 CLINICAL DATA:  Low back pain with right-sided radicular symptoms EXAM: LUMBAR SPINE - COMPLETE 4+ VIEW COMPARISON:  None. FINDINGS: Frontal, lateral, spot lumbosacral lateral, and bilateral oblique views were obtained. There are 5 non-rib-bearing lumbar type vertebral bodies. There is lumbar dextroscoliosis. There is no fracture. There is 3 mm of anterolisthesis of L3 on L4. No other spondylolisthesis. There is moderate disc space narrowing at L5-S1 with slightly milder disc space narrowing at L3-4 and L4-5. There  is facet osteoarthritic change at L4-5 and L5-S1 bilaterally. There is aortic atherosclerosis. IMPRESSION: No fracture. Mild spondylolisthesis at L3-4, likely due to underlying spondylosis. No other spondylolisthesis. Disc space narrowing at L5-S1 and to a somewhat lesser degree L3-4 and L4-5. There is facet osteoarthritic change at L4-5 and L5-S1 bilaterally. Aortic Atherosclerosis (ICD10-I70.0). Electronically Signed   By: Lowella Grip III M.D.   On: 06/20/2020 13:04   DG Hip Unilat W OR W/O Pelvis 2-3 Views Right  Result Date: 06/20/2020 CLINICAL DATA:  Pain EXAM: DG HIP (WITH OR WITHOUT PELVIS) 2-3V RIGHT COMPARISON:  None. FINDINGS: Upright frontal pelvis as well as upright frontal and supine lateral right hip images were obtained. There is no evident acute fracture or dislocation. There is mild narrowing of each hip joint, slightly more notable on the right than the left. There is apparent calcification in the inferior aspect of the right hip joint medially. There is bony overgrowth along each superolateral acetabulum. Sacroiliac joints appear symmetric bilaterally. 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 Calcification within the medial inferior right hip joint may represent residua of old trauma. Bony overgrowth along each superolateral acetabulum potentially places patient at increased risk for femoroacetabular impingement. No fracture or dislocation. Electronically Signed   By: Lowella Grip III M.D.   On: 06/20/2020 13:02   PCV ECHOCARDIOGRAM COMPLETE  Result Date: 07/15/2020 Echocardiogram 07/12/2020: Left ventricle cavity is normal in size. Mild concentric hypertrophy of the left ventricle. Normal global wall motion. Normal LV systolic function with EF 55%. Doppler evidence of grade  I (impaired) diastolic dysfunction, normal LAP. Calculated EF 55%. Trileaflet aortic valve.  Trace aortic regurgitation. Moderate (Grade II) mitral  regurgitation. Mild tricuspid regurgitation. Small pericardial effusion. No hemodynamic compromise.  I, Lynne Leader, personally (independently) visualized and performed the interpretation of the xray images attached in this note. 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  Right hip no severe right hip OA.  No fracture.  No significant abnormality greater trochanter.   Impression and Recommendations:    Assessment and Plan: 74 y.o. female with right low back and right lateral hip pain.  Majority of pain is at lateral hip near the insertion and origin of gluteus medius tendons.  Plan to continue physical therapy but augment including iontophoresis and dry needling.  Focus especially hip abduction strengthening.  If not improving would proceed with trial of steroid injection in greater trochanter although I think it is very likely that she will improve without the need of injection especially if we can add iontophoresis.  Recheck back as needed.Marland Kitchen  PDMP not reviewed this encounter. Orders Placed This Encounter  Procedures  . Ambulatory referral to Physical Therapy    Referral Priority:   Routine    Referral Type:   Physical Medicine    Referral Reason:   Specialty Services Required    Requested Specialty:   Physical Therapy   No orders of the defined types were placed in this encounter.   Discussed warning signs or symptoms. Please see discharge instructions. Patient expresses understanding.   The above documentation has been reviewed and is accurate and complete Lynne Leader, M.D.

## 2020-07-26 NOTE — Patient Instructions (Addendum)
Thank you for coming in today.  Plan to continue the PT.  I think we should add Iontophoresis and possibly dry needling.  If not better return and I can do a cortisone shot.  Could do more xray or even MRI in the future if needed.    Hip Bursitis  Hip bursitis is swelling of a fluid-filled sac (bursa) in your hip joint. This swelling (inflammation) can be painful. This condition may come and go over time. What are the causes?  Injury to the hip.  Overuse of the muscles that surround the hip joint.  An earlier injury or surgery of the hip.  Arthritis or gout.  Diabetes.  Thyroid disease.  Infection.  In some cases, the cause may not be known. What are the signs or symptoms?  Mild or moderate pain in the hip area. Pain may get worse with movement.  Tenderness and swelling of the hip, especially on the outer side of the hip.  In rare cases, the bursa may become infected. This may cause: ? A fever. ? Warmth and redness in the area. Symptoms may come and go. How is this treated? This condition is treated by resting, icing, applying pressure (compression), and raising (elevating) the injured area. You may hear this called the RICE treatment. Treatment may also include:  Using crutches.  Draining fluid out of the bursa to help relieve swelling.  Giving a shot of (injecting) medicine that helps to reduce swelling (cortisone).  Other medicines if the bursa is infected. Follow these instructions at home: Managing pain, stiffness, and swelling   If told, put ice on the painful area. ? Put ice in a plastic bag. ? Place a towel between your skin and the bag. ? Leave the ice on for 20 minutes, 2-3 times a day. ? Raise (elevate) your hip above the level of your heart as much as you can without pain. To do this, try putting a pillow under your hips while you lie down. Stop if this causes pain. Activity  Return to your normal activities as told by your doctor. Ask your  doctor what activities are safe for you.  Rest and protect your hip as much as you can until you feel better. General instructions  Take over-the-counter and prescription medicines only as told by your doctor.  Wear wraps that put pressure on your hip (compression wraps) only as told by your doctor.  Do not use your hip to support your body weight until your doctor says that you can.  Use crutches as told by your doctor.  Gently rub and stretch your injured area as often as is comfortable.  Keep all follow-up visits as told by your doctor. This is important. How is this prevented?  Exercise regularly, as told by your doctor.  Warm up and stretch before being active.  Cool down and stretch after being active.  Avoid activities that bother your hip or cause pain.  Avoid sitting down for long periods at a time. Contact a doctor if:  You have a fever.  You get new symptoms.  You have trouble walking.  You have trouble doing everyday activities.  You have pain that gets worse.  You have pain that does not get better with medicine.  You get red skin on your hip area.  You get a feeling of warmth in your hip area. Get help right away if:  You cannot move your hip.  You have very bad pain. Summary  Hip bursitis is  swelling of a fluid-filled sac (bursa) in your hip.  Hip bursitis can be painful.  Symptoms often come and go over time.  This condition is treated with rest, ice, compression, elevation, and medicines. This information is not intended to replace advice given to you by your health care provider. Make sure you discuss any questions you have with your health care provider. Document Revised: 06/08/2018 Document Reviewed: 06/08/2018 Elsevier Patient Education  [place].

## 2020-07-27 ENCOUNTER — Encounter: Payer: Self-pay | Admitting: Physical Therapy

## 2020-07-27 ENCOUNTER — Ambulatory Visit (INDEPENDENT_AMBULATORY_CARE_PROVIDER_SITE_OTHER): Payer: Medicare Other | Admitting: Physical Therapy

## 2020-07-27 DIAGNOSIS — M545 Low back pain, unspecified: Secondary | ICD-10-CM

## 2020-07-27 DIAGNOSIS — M25551 Pain in right hip: Secondary | ICD-10-CM

## 2020-07-29 ENCOUNTER — Encounter: Payer: Self-pay | Admitting: Physical Therapy

## 2020-07-29 NOTE — Therapy (Signed)
[facility] [address] [place], [place], [address] Phone: [phone]   Fax:  [phone]  Physical Therapy Treatment  Patient Details  Name: Gwenevere Goga MRN: [id number] Date of Birth: Jul 25, 1946 Referring Provider (PT): Inda Coke   Encounter Date: 07/27/2020   PT End of Session - 07/29/20 0705    Visit Number 5    Number of Visits 12    Date for PT Re-Evaluation 08/15/20    Authorization Type UHC    PT Start Time 1020    PT Stop Time 1100    PT Time Calculation (min) 40 min    Activity Tolerance Patient tolerated treatment well    Behavior During Therapy [facility] for tasks assessed/performed           Past Medical History:  Diagnosis Date  . Arthritis   . Chronic kidney disease   . Diabetes mellitus without complication ([place])   . Hypertension   . Iron deficiency    on oral iron supplements, has not required transfusion  . Mixed hyperlipidemia   . OSA on CPAP    compliant  . Thyroid nodule    removed    Past Surgical History:  Procedure Laterality Date  . ABDOMINAL HYSTERECTOMY  1996  . MASS EXCISION Right 2017   right arm mass removal  . THYROID SURGERY     left side removal benign nodule    There were no vitals filed for this visit.   Subjective Assessment - 07/29/20 0704    Subjective Pt states continued soreness in hip. Did have appt with Sports Med this week.    Currently in Pain? Yes    Pain Score 5     Pain Location Hip    Pain Orientation Right    Pain Descriptors / Indicators Aching    Pain Type Acute pain    Pain Onset More than a month ago    Pain Frequency Intermittent                             [facility] Adult PT Treatment/Exercise - 07/29/20 0001      Exercises   Exercises Lumbar      Lumbar Exercises: Stretches   Single Knee to Chest Stretch 3 reps;30 seconds    Piriformis Stretch 3 reps;30 seconds    Piriformis Stretch Limitations seated      Lumbar  Exercises: Aerobic   Recumbent Bike L2 x 8 min ;       Lumbar Exercises: Standing   Other Standing Lumbar Exercises hip abd and march x 20 ea bil;       Lumbar Exercises: Supine   Clam 20 reps    Clam Limitations GTB    Bridge 20 reps    Straight Leg Raise 15 reps    Straight Leg Raises Limitations bil      Lumbar Exercises: Sidelying   Hip Abduction Both;15 reps      Modalities   Modalities Iontophoresis      Iontophoresis   Type of Iontophoresis Dexamethasone    Location R gr trochanter    Time 4 hr patch      Manual Therapy   Manual Therapy Joint mobilization;Passive ROM;Manual Traction;Soft tissue mobilization    Manual therapy comments skilled palpation and  monitoting of soft tissue with dry needling     Soft tissue mobilization DTM/IASTM to lateral hip and gr troch, ITB  Trigger Point Dry Needling - 07/29/20 0001    Consent Given? Yes    Education Handout Provided Yes    Muscles Treated Back/Hip Gluteus minimus;Gluteus maximus;Piriformis    Gluteus Minimus Response Palpable increased muscle length   R   Gluteus Maximus Response Palpable increased muscle length   R   Piriformis Response Palpable increased muscle length   R                 PT Short Term Goals - 07/18/20 1114      PT SHORT TERM GOAL #1   Title Pt to be independent with initial HEP    Time 2    Period Weeks    Status Achieved    Target Date 07/18/20             PT Long Term Goals - 07/04/20 1256      PT LONG TERM GOAL #1   Title Pt to be independent with final HEP    Time 6    Period Weeks    Status New    Target Date 08/15/20      PT LONG TERM GOAL #2   Title Pt to report decreased pain in R hip and back to 0-2/10 with standing and walking activity    Time 6    Period Weeks    Status New    Target Date 08/15/20      PT LONG TERM GOAL #3   Title Pt to demo improved hip strength to at least 4+/5 to improve stability, gait, and pain    Time 6    Period  Weeks    Status New    Target Date 08/15/20      PT LONG TERM GOAL #4   Title Pt to demo soft tissue restrictions in R glute to be WNL , to improve pain and function    Time 6    Period Weeks    Status New    Target Date 08/15/20                 Plan - 07/29/20 0706    Clinical Impression Statement Pt with continued soreness in R glute region. Manual, DN, and Ionto done for pain today. Pt with most pain in gr trochanter and lateral hip (ionto applied), but also has some trigger points in glute that are sore as well. Reviewed HEP for mobility and strengthening. Pt to benefit from continued care.    Personal Factors and Comorbidities Time since onset of injury/illness/exacerbation    Examination-Activity Limitations Locomotion Level;Bend;Squat;Stairs;Stand;Lift    Examination-Participation Restrictions Cleaning;Community Activity;Shop;Yard Work    Stability/Clinical Decision Making Stable/Uncomplicated    Rehab Potential Good    PT Frequency 2x / week    PT Duration 6 weeks    PT Treatment/Interventions ADLs/Self Care Home Management;Electrical Stimulation;Cryotherapy;Iontophoresis 4mg /ml Dexamethasone;Moist Heat;Traction;DME Instruction;Gait training;Neuromuscular re-education;Balance training;Therapeutic exercise;Functional mobility training;Stair training;Patient/family education;Manual techniques;Taping;Dry needling;Passive range of motion;Spinal Manipulations;Joint Manipulations    Consulted and Agree with Plan of Care Patient           Patient will benefit from skilled therapeutic intervention in order to improve the following deficits and impairments:  Abnormal gait, Decreased range of motion, Difficulty walking, Increased muscle spasms, Decreased activity tolerance, Pain, Impaired flexibility, Improper body mechanics, Decreased mobility, Decreased strength, Postural dysfunction  Visit Diagnosis: Acute right-sided low back pain without sciatica  Pain in right  hip     Problem List Patient Active Problem List   Diagnosis Date  Noted  . Type 2 diabetes mellitus with hyperglycemia, with long-term current use of insulin ([place]) 06/06/2020  . Memory deficits 03/16/2020  . Diabetes mellitus without complication ([place]) 00/51/1021  . OSA on CPAP   . Chronic kidney disease   . Hypertension   . Mixed hyperlipidemia     [name], PT, DPT 7:17 AM  07/29/20    [facility] [place], [place], [address] Phone: [phone]   Fax:  [phone]  Name: Carlynn Leduc MRN: [id number] Date of Birth: 1946-01-13

## 2020-08-08 ENCOUNTER — Ambulatory Visit (INDEPENDENT_AMBULATORY_CARE_PROVIDER_SITE_OTHER): Payer: Medicare Other | Admitting: Physical Therapy

## 2020-08-08 ENCOUNTER — Ambulatory Visit: Payer: Medicare Other | Admitting: Podiatry

## 2020-08-08 ENCOUNTER — Encounter: Payer: Self-pay | Admitting: Physical Therapy

## 2020-08-08 ENCOUNTER — Other Ambulatory Visit: Payer: Self-pay

## 2020-08-08 DIAGNOSIS — B351 Tinea unguium: Secondary | ICD-10-CM | POA: Diagnosis not present

## 2020-08-08 DIAGNOSIS — M25551 Pain in right hip: Secondary | ICD-10-CM

## 2020-08-08 DIAGNOSIS — E1142 Type 2 diabetes mellitus with diabetic polyneuropathy: Secondary | ICD-10-CM | POA: Diagnosis not present

## 2020-08-08 DIAGNOSIS — M545 Low back pain, unspecified: Secondary | ICD-10-CM

## 2020-08-08 DIAGNOSIS — E1169 Type 2 diabetes mellitus with other specified complication: Secondary | ICD-10-CM

## 2020-08-10 ENCOUNTER — Ambulatory Visit (INDEPENDENT_AMBULATORY_CARE_PROVIDER_SITE_OTHER): Payer: Medicare Other | Admitting: Physical Therapy

## 2020-08-10 ENCOUNTER — Other Ambulatory Visit: Payer: Self-pay

## 2020-08-10 ENCOUNTER — Encounter: Payer: Self-pay | Admitting: Physical Therapy

## 2020-08-10 DIAGNOSIS — M545 Low back pain, unspecified: Secondary | ICD-10-CM

## 2020-08-10 DIAGNOSIS — M25551 Pain in right hip: Secondary | ICD-10-CM | POA: Diagnosis not present

## 2020-08-10 NOTE — Therapy (Signed)
[facility] [address] [place], [place], [address] Phone: [phone]   Fax:  [phone]  Physical Therapy Treatment  Patient Details  Name: Catherine Mcconnell MRN: [id number] Date of Birth: 07-24-1946 Referring Provider (PT): Inda Coke   Encounter Date: 08/10/2020   PT End of Session - 08/10/20 1233    Visit Number 7    Number of Visits 12    Date for PT Re-Evaluation 08/15/20    Authorization Type UHC    PT Start Time 0930    PT Stop Time 1017    PT Time Calculation (min) 47 min    Activity Tolerance Patient tolerated treatment well    Behavior During Therapy [facility] for tasks assessed/performed           Past Medical History:  Diagnosis Date   Arthritis    Chronic kidney disease    Diabetes mellitus without complication ([place])    Hypertension    Iron deficiency    on oral iron supplements, has not required transfusion   Mixed hyperlipidemia    OSA on CPAP    compliant   Thyroid nodule    removed    Past Surgical History:  Procedure Laterality Date   [place] Right 2017   right arm mass removal   THYROID SURGERY     left side removal benign nodule    There were no vitals filed for this visit.   Subjective Assessment - 08/10/20 1233    Subjective Pt states decreased pain in hip. Low back still sore today    Currently in Pain? Yes    Pain Score 2     Pain Location Hip    Pain Orientation Right    Pain Descriptors / Indicators Aching    Pain Type Acute pain    Pain Score 3    Pain Location Back    Pain Orientation Right    Pain Descriptors / Indicators Aching                             [facility] Adult PT Treatment/Exercise - 08/10/20 0941      Exercises   Exercises Lumbar      Lumbar Exercises: Stretches   Single Knee to Chest Stretch --    Lower Trunk Rotation --    Standing Side Bend --    Standing Side Bend Limitations --     Piriformis Stretch --    Piriformis Stretch Limitations --      Lumbar Exercises: Aerobic   Recumbent Bike L2 x 8 min ;       Lumbar Exercises: Standing   Other Standing Lumbar Exercises hip abd , ext , and march x 20 ea bil;       Lumbar Exercises: Supine   Ab Set 10 reps    Clam 20 reps    Clam Limitations GTB with TA    Bridge 20 reps    Straight Leg Raise 10 reps      Lumbar Exercises: Sidelying   Hip Abduction Both;15 reps      Modalities   Modalities Iontophoresis      Iontophoresis   Type of Iontophoresis Dexamethasone    Location R gr trochanter    Time 4 hr patch      Manual Therapy   Manual Therapy Joint mobilization;Passive ROM;Manual Traction;Soft tissue mobilization    Manual therapy comments skilled palpation  and  monitoting of soft tissue with dry needling     Joint Mobilization PA mobs lumbar spine     Soft tissue mobilization DTM/IASTM to lateral hip and gr troch, ITB, DTM to R lumbar     Manual Traction For R hip and lumbar x 2 min             Trigger Point Dry Needling - 08/10/20 1232    Consent Given? Yes    Gluteus Minimus Response Palpable increased muscle length   R   Piriformis Response Palpable increased muscle length   R                 PT Short Term Goals - 07/18/20 1114      PT SHORT TERM GOAL #1   Title Pt to be independent with initial HEP    Time 2    Period Weeks    Status Achieved    Target Date 07/18/20             PT Long Term Goals - 07/04/20 1256      PT LONG TERM GOAL #1   Title Pt to be independent with final HEP    Time 6    Period Weeks    Status New    Target Date 08/15/20      PT LONG TERM GOAL #2   Title Pt to report decreased pain in R hip and back to 0-2/10 with standing and walking activity    Time 6    Period Weeks    Status New    Target Date 08/15/20      PT LONG TERM GOAL #3   Title Pt to demo improved hip strength to at least 4+/5 to improve stability, gait, and pain    Time 6     Period Weeks    Status New    Target Date 08/15/20      PT LONG TERM GOAL #4   Title Pt to demo soft tissue restrictions in R glute to be WNL , to improve pain and function    Time 6    Period Weeks    Status New    Target Date 08/15/20                 Plan - 08/10/20 1235    Clinical Impression Statement Pt with decreased pain with movement in R hip, still has mild pain with palpation and DTM at gr trochanter. Pt to benefit from continued manual for pain and ther ex for strength.    Personal Factors and Comorbidities Time since onset of injury/illness/exacerbation    Examination-Activity Limitations Locomotion Level;Bend;Squat;Stairs;Stand;Lift    Examination-Participation Restrictions Cleaning;Community Activity;Shop;Yard Work    Stability/Clinical Decision Making Stable/Uncomplicated    Rehab Potential Good    PT Frequency 2x / week    PT Duration 6 weeks    PT Treatment/Interventions ADLs/Self Care Home Management;Electrical Stimulation;Cryotherapy;Iontophoresis 4mg /ml Dexamethasone;Moist Heat;Traction;DME Instruction;Gait training;Neuromuscular re-education;Balance training;Therapeutic exercise;Functional mobility training;Stair training;Patient/family education;Manual techniques;Taping;Dry needling;Passive range of motion;Spinal Manipulations;Joint Manipulations    Consulted and Agree with Plan of Care Patient           Patient will benefit from skilled therapeutic intervention in order to improve the following deficits and impairments:  Abnormal gait, Decreased range of motion, Difficulty walking, Increased muscle spasms, Decreased activity tolerance, Pain, Impaired flexibility, Improper body mechanics, Decreased mobility, Decreased strength, Postural dysfunction  Visit Diagnosis: Acute right-sided low back pain without sciatica  Pain in right  hip     Problem List Patient Active Problem List   Diagnosis Date Noted   Type 2 diabetes mellitus with hyperglycemia,  with long-term current use of insulin ([place]) 06/06/2020   Memory deficits 03/16/2020   Diabetes mellitus without complication ([place]) 53/29/9242   OSA on CPAP    Chronic kidney disease    Hypertension    Mixed hyperlipidemia    Catherine Mcconnell, PT, DPT 12:36 PM  08/10/20    [facility] [place], [place], [address] Phone: [phone]   Fax:  [phone]  Name: Catherine Mcconnell MRN: [id number] Date of Birth: [birth year]

## 2020-08-10 NOTE — Therapy (Signed)
[facility] [address] [place], [place], [address] Phone: [phone]   Fax:  [phone]  Physical Therapy Treatment  Patient Details  Name: Catherine Mcconnell MRN: [id number] Date of Birth: June 11, 1946 Referring Provider (PT): Inda Coke   Encounter Date: 08/08/2020   PT End of Session - 08/10/20 0931    Visit Number 6    Number of Visits 12    Date for PT Re-Evaluation 08/15/20    Authorization Type UHC    PT Start Time 1215    PT Stop Time 1300    PT Time Calculation (min) 45 min    Activity Tolerance Patient tolerated treatment well    Behavior During Therapy [facility] for tasks assessed/performed           Past Medical History:  Diagnosis Date  . Arthritis   . Chronic kidney disease   . Diabetes mellitus without complication ([place])   . Hypertension   . Iron deficiency    on oral iron supplements, has not required transfusion  . Mixed hyperlipidemia   . OSA on CPAP    compliant  . Thyroid nodule    removed    Past Surgical History:  Procedure Laterality Date  . ABDOMINAL HYSTERECTOMY  1996  . MASS EXCISION Right 2017   right arm mass removal  . THYROID SURGERY     left side removal benign nodule    There were no vitals filed for this visit.   Subjective Assessment - 08/10/20 0930    Subjective Pt states decreased soreness after last visit, but now pain has returned. R side of low back also sore today    Currently in Pain? Yes    Pain Score 5     Pain Location Hip    Pain Orientation Right    Pain Descriptors / Indicators Aching    Pain Type Acute pain    Pain Onset More than a month ago    Pain Frequency Intermittent    Pain Score 3    Pain Location Back    Pain Orientation Right    Pain Descriptors / Indicators Aching    Pain Type Acute pain    Pain Onset More than a month ago    Pain Frequency Intermittent                             [facility] Adult PT Treatment/Exercise -  08/10/20 0001      Exercises   Exercises Lumbar      Lumbar Exercises: Stretches   Single Knee to Chest Stretch 3 reps;30 seconds    Lower Trunk Rotation 5 reps;10 seconds    Standing Side Bend 3 reps;30 seconds    Standing Side Bend Limitations standing QL     Piriformis Stretch 3 reps;30 seconds    Piriformis Stretch Limitations seated      Lumbar Exercises: Aerobic   Recumbent Bike L2 x 8 min ;       Lumbar Exercises: Standing   Other Standing Lumbar Exercises hip abd and march x 20 ea bil;       Lumbar Exercises: Supine   Ab Set 10 reps    Bridge 20 reps      Lumbar Exercises: Sidelying   Hip Abduction Both;15 reps      Modalities   Modalities Iontophoresis      Iontophoresis   Type of Iontophoresis Dexamethasone    Location R gr  trochanter    Time 4 hr patch      Manual Therapy   Manual Therapy Joint mobilization;Passive ROM;Manual Traction;Soft tissue mobilization    Manual therapy comments skilled palpation and  monitoting of soft tissue with dry needling     Soft tissue mobilization DTM/IASTM to lateral hip and gr troch, ITB    Manual Traction For R hip and lumbar x 2 min             Trigger Point Dry Needling - 08/10/20 0001    Consent Given? Yes    Muscles Treated Back/Hip Lumbar multifidi    Lumbar multifidi Response Palpable increased muscle length   L3-5                PT Education - 08/10/20 0931    Education Details HEP reviewed            PT Short Term Goals - 07/18/20 1114      PT SHORT TERM GOAL #1   Title Pt to be independent with initial HEP    Time 2    Period Weeks    Status Achieved    Target Date 07/18/20             PT Long Term Goals - 07/04/20 1256      PT LONG TERM GOAL #1   Title Pt to be independent with final HEP    Time 6    Period Weeks    Status New    Target Date 08/15/20      PT LONG TERM GOAL #2   Title Pt to report decreased pain in R hip and back to 0-2/10 with standing and walking activity     Time 6    Period Weeks    Status New    Target Date 08/15/20      PT LONG TERM GOAL #3   Title Pt to demo improved hip strength to at least 4+/5 to improve stability, gait, and pain    Time 6    Period Weeks    Status New    Target Date 08/15/20      PT LONG TERM GOAL #4   Title Pt to demo soft tissue restrictions in R glute to be WNL , to improve pain and function    Time 6    Period Weeks    Status New    Target Date 08/15/20                 Plan - 08/10/20 0932    Clinical Impression Statement Manual and DN done today for back and hip pain. Pt continues to have soreness on gr trochanter region. Also in R low lumbar region. Continued ther ex for strength and stabilization.    Personal Factors and Comorbidities Time since onset of injury/illness/exacerbation    Examination-Activity Limitations Locomotion Level;Bend;Squat;Stairs;Stand;Lift    Examination-Participation Restrictions Cleaning;Community Activity;Shop;Yard Work    Stability/Clinical Decision Making Stable/Uncomplicated    Rehab Potential Good    PT Frequency 2x / week    PT Duration 6 weeks    PT Treatment/Interventions ADLs/Self Care Home Management;Electrical Stimulation;Cryotherapy;Iontophoresis 4mg /ml Dexamethasone;Moist Heat;Traction;DME Instruction;Gait training;Neuromuscular re-education;Balance training;Therapeutic exercise;Functional mobility training;Stair training;Patient/family education;Manual techniques;Taping;Dry needling;Passive range of motion;Spinal Manipulations;Joint Manipulations    Consulted and Agree with Plan of Care Patient           Patient will benefit from skilled therapeutic intervention in order to improve the following deficits and impairments:  Abnormal gait, Decreased range  of motion, Difficulty walking, Increased muscle spasms, Decreased activity tolerance, Pain, Impaired flexibility, Improper body mechanics, Decreased mobility, Decreased strength, Postural  dysfunction  Visit Diagnosis: Acute right-sided low back pain without sciatica  Pain in right hip     Problem List Patient Active Problem List   Diagnosis Date Noted  . Type 2 diabetes mellitus with hyperglycemia, with long-term current use of insulin ([place]) 06/06/2020  . Memory deficits 03/16/2020  . Diabetes mellitus without complication ([place]) 59/92/3414  . OSA on CPAP   . Chronic kidney disease   . Hypertension   . Mixed hyperlipidemia     [name], PT, DPT 9:36 AM  08/10/20    [facility] [place], [place], [address] Phone: [phone]   Fax:  [phone]  Name: Catherine Mcconnell MRN: [id number] Date of Birth: 03-May-1946

## 2020-08-13 NOTE — Progress Notes (Signed)
  Subjective:  Patient ID: Catherine Mcconnell, female    DOB: 31-Jan-1946,  MRN: [id number]  Chief Complaint  Patient presents with  . Tinea Pedis    Greatly resolved with topical medicines.  . Nail Problem    Nail trim 1-5 bilateral    74 y.o. female presents with the above complaint. History confirmed with patient. Still has some scaling to the feet.  Objective:  Physical Exam: warm, good capillary refill, nail exam normal nails without lesions and onychomycosis of the toenails, no trophic changes or ulcerative lesions. DP pulses palpable and protective sensation intact. Xerosis with scaling plantar foot bilat. Left Foot: normal exam, no swelling, tenderness, instability; ligaments intact, full range of motion of all ankle/foot joints  Right Foot: normal exam, no swelling, tenderness, instability; ligaments intact, full range of motion of all ankle/foot joints   No images are attached to the encounter.  Assessment:   1. 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  Onychomycosis, Diabetes and PAD -Nails debrided as below   Procedure: Nail Debridement Rationale: Patient meets criteria for routine foot care due to PAD Type of Debridement: manual, sharp debridement. Instrumentation: Nail nipper, rotary burr. Number of Nails: 10      No follow-ups on file.

## 2020-08-15 ENCOUNTER — Other Ambulatory Visit: Payer: Self-pay

## 2020-08-15 ENCOUNTER — Encounter: Payer: Self-pay | Admitting: Physical Therapy

## 2020-08-15 ENCOUNTER — Ambulatory Visit (INDEPENDENT_AMBULATORY_CARE_PROVIDER_SITE_OTHER): Payer: Medicare Other | Admitting: Physical Therapy

## 2020-08-15 DIAGNOSIS — M25551 Pain in right hip: Secondary | ICD-10-CM | POA: Diagnosis not present

## 2020-08-15 DIAGNOSIS — M545 Low back pain, unspecified: Secondary | ICD-10-CM | POA: Diagnosis not present

## 2020-08-15 NOTE — Therapy (Signed)
Catherine Mcconnell [address] [place], [place], [address] Phone: [phone]   Fax:  [phone]  Physical Therapy Treatment  Patient Details  Name: Catherine Mcconnell MRN: [id number] Date of Birth: [birth year] Referring Provider (PT): Inda Coke   Encounter Date: 08/15/2020   PT End of Session - 08/15/20 0946    Visit Number 8    Number of Visits 12    Date for PT Re-Evaluation 08/15/20    Authorization Type UHC    PT Start Time 0935    PT Stop Time 1015    PT Time Calculation (min) 40 min    Activity Tolerance Patient tolerated treatment well    Behavior During Therapy WFL for tasks assessed/performed           Past Medical History:  Diagnosis Date   Arthritis    Chronic kidney disease    Diabetes mellitus without complication ([place])    Hypertension    Iron deficiency    on oral iron supplements, has not required transfusion   Mixed hyperlipidemia    OSA on CPAP    compliant   Thyroid nodule    removed    Past Surgical History:  Procedure Laterality Date   [place] Right 2017   right arm mass removal   THYROID SURGERY     left side removal benign nodule    There were no vitals filed for this visit.   Subjective Assessment - 08/15/20 0943    Subjective Pt stats continued pain in R low back, R hip sore, not as painful as previously    Patient Stated Goals decreased pain    Currently in Pain? Yes    Pain Score 4     Pain Location Hip    Pain Orientation Right    Pain Descriptors / Indicators Aching    Pain Type Acute pain    Pain Score 5    Pain Location Back    Pain Orientation Right    Pain Descriptors / Indicators Aching    Pain Type Acute pain    Pain Onset More than a month ago    Pain Frequency Intermittent                             [facility] Adult PT Treatment/Exercise - 08/15/20 0001      Self-Care   Self-Care Other Self-Care  Comments    Other Self-Care Comments  Heel lift in L shoe for improving lumbar posture and alignement. discussed use, and precautions.       Lumbar Exercises: Stretches   Standing Side Bend 3 reps;30 seconds    Standing Side Bend Limitations standing QL to L       Lumbar Exercises: Aerobic   Recumbent Bike L2 x 8 min ;       Lumbar Exercises: Supine   Straight Leg Raise 10 reps      Lumbar Exercises: Sidelying   Hip Abduction Both;15 reps      Manual Therapy   Joint Mobilization hip inf and post mobs gr 3     Soft tissue mobilization , DTM to R lumbar     Manual Traction For R hip and lumbar x 2 min                     PT Short Term Goals - 07/18/20 1114  PT SHORT TERM GOAL #1   Title Pt to be independent with initial HEP    Time 2    Period Weeks    Status Achieved    Target Date 07/18/20             PT Long Term Goals - 07/04/20 1256      PT LONG TERM GOAL #1   Title Pt to be independent with final HEP    Time 6    Period Weeks    Status New    Target Date 08/15/20      PT LONG TERM GOAL #2   Title Pt to report decreased pain in R hip and back to 0-2/10 with standing and walking activity    Time 6    Period Weeks    Status New    Target Date 08/15/20      PT LONG TERM GOAL #3   Title Pt to demo improved hip strength to at least 4+/5 to improve stability, gait, and pain    Time 6    Period Weeks    Status New    Target Date 08/15/20      PT LONG TERM GOAL #4   Title Pt to demo soft tissue restrictions in R glute to be WNL , to improve pain and function    Time 6    Period Weeks    Status New    Target Date 08/15/20                 Plan - 08/15/20 1336    Clinical Impression Statement Pt continues to have bothersome pain in R lumbar region. Minimal pain with palpation today, but has increased pain with standing/ compression. Noted shift in lumbar region from scoliosis and mild leg length deficit. Pt given heel lift on L, will  assess for posture and pain relief next visit.    Personal Factors and Comorbidities Time since onset of injury/illness/exacerbation    Examination-Activity Limitations Locomotion Level;Bend;Squat;Stairs;Stand;Lift    Examination-Participation Restrictions Cleaning;Community Activity;Shop;Yard Work    Stability/Clinical Decision Making Stable/Uncomplicated    Rehab Potential Good    PT Frequency 2x / week    PT Duration 6 weeks    PT Treatment/Interventions ADLs/Self Care Home Management;Electrical Stimulation;Cryotherapy;Iontophoresis 4mg /ml Dexamethasone;Moist Heat;Traction;DME Instruction;Gait training;Neuromuscular re-education;Balance training;Therapeutic exercise;Functional mobility training;Stair training;Patient/family education;Manual techniques;Taping;Dry needling;Passive range of motion;Spinal Manipulations;Joint Manipulations    Consulted and Agree with Plan of Care Patient           Patient will benefit from skilled therapeutic intervention in order to improve the following deficits and impairments:  Abnormal gait, Decreased range of motion, Difficulty walking, Increased muscle spasms, Decreased activity tolerance, Pain, Impaired flexibility, Improper body mechanics, Decreased mobility, Decreased strength, Postural dysfunction  Visit Diagnosis: Acute right-sided low back pain without sciatica  Pain in right hip     Problem List Patient Active Problem List   Diagnosis Date Noted   Type 2 diabetes mellitus with hyperglycemia, with long-term current use of insulin ([place]) 06/06/2020   Memory deficits 03/16/2020   Diabetes mellitus without complication ([place]) 25/36/6440   OSA on CPAP    Chronic kidney disease    Hypertension    Mixed hyperlipidemia    Catherine Mcconnell, PT, DPT 1:38 PM  08/15/20    [facility] [address] [place], [place], [address] Phone: [phone]   Fax:  [phone]  Name: Catherine Mcconnell MRN: [id number] Date of Birth: January 25, 1946

## 2020-08-17 ENCOUNTER — Other Ambulatory Visit: Payer: Self-pay

## 2020-08-17 ENCOUNTER — Encounter: Payer: Self-pay | Admitting: Physical Therapy

## 2020-08-17 ENCOUNTER — Ambulatory Visit (INDEPENDENT_AMBULATORY_CARE_PROVIDER_SITE_OTHER): Payer: Medicare Other | Admitting: Physical Therapy

## 2020-08-17 ENCOUNTER — Ambulatory Visit: Payer: Medicare Other | Admitting: Family Medicine

## 2020-08-17 ENCOUNTER — Encounter: Payer: Self-pay | Admitting: Family Medicine

## 2020-08-17 VITALS — BP 158/78 | HR 75 | Ht 66.0 in | Wt 156.0 lb

## 2020-08-17 DIAGNOSIS — M21371 Foot drop, right foot: Secondary | ICD-10-CM

## 2020-08-17 DIAGNOSIS — M545 Low back pain, unspecified: Secondary | ICD-10-CM

## 2020-08-17 DIAGNOSIS — M5416 Radiculopathy, lumbar region: Secondary | ICD-10-CM

## 2020-08-17 DIAGNOSIS — M25551 Pain in right hip: Secondary | ICD-10-CM

## 2020-08-17 MED ORDER — AMBULATORY NON FORMULARY MEDICATION: 0 refills | Status: DC

## 2020-08-17 MED ORDER — PREDNISONE 10 MG PO TABS: 30.0000 mg | ORAL_TABLET | Freq: Every day | ORAL | 0 refills | Status: DC

## 2020-08-17 NOTE — Therapy (Signed)
[facility] [address] [place], [place], [address] Phone: [phone]   Fax:  [phone]  Physical Therapy Treatment  Patient Details  Name: Catherine Mcconnell MRN: [id number] Date of Birth: [birth year] Referring Provider (PT): Inda Coke   Encounter Date: 08/17/2020   PT End of Session - 08/17/20 0945    Visit Number 9    Number of Visits 12    Date for PT Re-Evaluation 09/28/20    Authorization Type UHC    PT Start Time 0935    PT Stop Time 1015    PT Time Calculation (min) 40 min    Activity Tolerance Patient tolerated treatment well    Behavior During Therapy [facility] for tasks assessed/performed           Past Medical History:  Diagnosis Date  . Arthritis   . Chronic kidney disease   . Diabetes mellitus without complication ([place])   . Hypertension   . Iron deficiency    on oral iron supplements, has not required transfusion  . Mixed hyperlipidemia   . OSA on CPAP    compliant  . Thyroid nodule    removed    Past Surgical History:  Procedure Laterality Date  . ABDOMINAL HYSTERECTOMY  1996  . MASS EXCISION Right 2017   right arm mass removal  . THYROID SURGERY     left side removal benign nodule    There were no vitals filed for this visit.   Subjective Assessment - 08/17/20 0942    Subjective Pt states no pain in hips or back today , but did have pain in R thigh yesterday. States variable pain levels. She also notes that she feels R foot is weaker, when shes walking.    Currently in Pain? Yes    Pain Score 0-No pain    Pain Location Hip    Pain Orientation Right;Left    Pain Descriptors / Indicators Aching    Pain Type Acute pain    Pain Score 0    Pain Location Back    Pain Orientation Right    Pain Descriptors / Indicators Aching    Pain Type Acute pain    Pain Onset More than a month ago    Pain Frequency Intermittent              [facility] PT Assessment - 08/17/20 0001      Strength    Overall Strength Comments R ankle DF: 3 to 3-/5;  L: 4+/5                          [facility] Adult PT Treatment/Exercise - 08/17/20 0001      Self-Care   Other Self-Care Comments  Heel lift removed - pt request (1/4 in eva foam)       Lumbar Exercises: Aerobic   Recumbent Bike L2 x 4 min ; L1 x 3 min    states L leg is tired today with bike      Lumbar Exercises: Standing   Heel Raises 20 reps    Lifting 5 reps;From 12"    Lifting Limitations for education on form for IADLS     Other Standing Lumbar Exercises March , hip abd , 2x 10 each       Lumbar Exercises: Seated   Sit to Stand 5 reps    Other Seated Lumbar Exercises seated ankle DF x 10 (education for HEP)  Lumbar Exercises: Supine   Clam 20 reps    Clam Limitations GTB with TA    Straight Leg Raise 10 reps    Straight Leg Raises Limitations bil      Lumbar Exercises: Sidelying   Hip Abduction Both;15 reps      Manual Therapy   Manual therapy comments no pain to palpate bil gr troch or ITB today     Manual Traction For R hip and lumbar x 2 min                     PT Short Term Goals - 07/18/20 1114      PT SHORT TERM GOAL #1   Title Pt to be independent with initial HEP    Time 2    Period Weeks    Status Achieved    Target Date 07/18/20             PT Long Term Goals - 07/04/20 1256      PT LONG TERM GOAL #1   Title Pt to be independent with final HEP    Time 6    Period Weeks    Status New    Target Date 08/15/20      PT LONG TERM GOAL #2   Title Pt to report decreased pain in R hip and back to 0-2/10 with standing and walking activity    Time 6    Period Weeks    Status New    Target Date 08/15/20      PT LONG TERM GOAL #3   Title Pt to demo improved hip strength to at least 4+/5 to improve stability, gait, and pain    Time 6    Period Weeks    Status New    Target Date 08/15/20      PT LONG TERM GOAL #4   Title Pt to demo soft tissue restrictions in R glute  to be WNL , to improve pain and function    Time 6    Period Weeks    Status New    Target Date 08/15/20                 Plan - 08/17/20 1048    Clinical Impression Statement Heel lift in L shoe removed today at pts request, did not think it was comfortable. DF strength on R tested today, is diminished compared to L side. Reviewed strengthenning for this for HEP. Decreased pain with palpation of bil hips today, R LE still more challenging with ther ex due to weakness. Pain seems to be variable from day to day. Also discussed lumbar posture for bending and IADLS today. Pt to benefit from continued care for pain relief and strengthening.    Personal Factors and Comorbidities Time since onset of injury/illness/exacerbation    Examination-Activity Limitations Locomotion Level;Bend;Squat;Stairs;Stand;Lift    Examination-Participation Restrictions Cleaning;Community Activity;Shop;Yard Work    Stability/Clinical Decision Making Stable/Uncomplicated    Rehab Potential Good    PT Frequency 2x / week    PT Duration 6 weeks    PT Treatment/Interventions ADLs/Self Care Home Management;Electrical Stimulation;Cryotherapy;Iontophoresis 4mg /ml Dexamethasone;Moist Heat;Traction;DME Instruction;Gait training;Neuromuscular re-education;Balance training;Therapeutic exercise;Functional mobility training;Stair training;Patient/family education;Manual techniques;Taping;Dry needling;Passive range of motion;Spinal Manipulations;Joint Manipulations    Consulted and Agree with Plan of Care Patient           Patient will benefit from skilled therapeutic intervention in order to improve the following deficits and impairments:  Abnormal gait, Decreased range of motion,  Difficulty walking, Increased muscle spasms, Decreased activity tolerance, Pain, Impaired flexibility, Improper body mechanics, Decreased mobility, Decreased strength, Postural dysfunction  Visit Diagnosis: Acute right-sided low back pain without  sciatica  Pain in right hip     Problem List Patient Active Problem List   Diagnosis Date Noted  . Type 2 diabetes mellitus with hyperglycemia, with long-term current use of insulin ([place]) 06/06/2020  . Memory deficits 03/16/2020  . Diabetes mellitus without complication ([place]) 31/54/0086  . OSA on CPAP   . Chronic kidney disease   . Hypertension   . Mixed hyperlipidemia     [name] 08/17/2020, 10:51 AM  [facility] [address] [place], [place], [address] Phone: [phone]   Fax:  [phone]  Name: Catherine Mcconnell MRN: [id number] Date of Birth: 03/31/1946

## 2020-08-17 NOTE — Progress Notes (Signed)
I, Wendy Poet, LAT, ATC, am serving as scribe for Dr. Lynne Leader.  Catherine Mcconnell is a 74 y.o. female who presents to [place] [facility] today for f/u of LBP and R hip pain and new issue of R foot drop after doing dry needling at PT per pt.  She was last seen by Dr. Georgina Snell on 07/26/20 and was advised to con't PT that she was doing prior to seeing him.  Pt has now completed 8 PT sessions and has been getting dry needling.  Since her last visit, pt reports new onset of R ankle DF weakness x approximately one week that she noticed after her last round of dry needling.  Her low back and R hip are feeling better.  Diagnostic testing: L-spine and R hip XR- 06/20/20   Pertinent review of systems: No fevers or chills  Relevant historical information: Diabetes   Exam:  BP (!) 158/78 (BP Location: Right Arm, Patient Position: Sitting, Cuff Size: Normal)   Pulse 75   Ht 5\' 6"  (1.676 m)   Wt 156 lb (70.8 kg)   SpO2 97%   BMI 25.18 kg/m  General: Well Developed, well nourished, and in no acute distress.   MSK: L-spine nontender normal motion. Lower extremity strength diminished right foot dorsiflexion and great toe dorsiflexion 3+/5 Lower extremity strength equal normal bilaterally otherwise. Reflexes diminished bilateral knees and ankles. Sensation intact distally bilaterally.    Lab and Radiology Results EXAM: LUMBAR SPINE - COMPLETE 4+ VIEW  COMPARISON:  None.  FINDINGS: Frontal, lateral, spot lumbosacral lateral, and bilateral oblique views were obtained. There are 5 non-rib-bearing lumbar type vertebral bodies. There is lumbar dextroscoliosis. There is no fracture. There is 3 mm of anterolisthesis of L3 on L4. No other spondylolisthesis. There is moderate disc space narrowing at L5-S1 with slightly milder disc space narrowing at L3-4 and L4-5. There is facet osteoarthritic change at L4-5 and L5-S1 bilaterally. There is aortic  atherosclerosis.  IMPRESSION: No fracture. Mild spondylolisthesis at L3-4, likely due to underlying spondylosis. No other spondylolisthesis. Disc space narrowing at L5-S1 and to a somewhat lesser degree L3-4 and L4-5. There is facet osteoarthritic change at L4-5 and L5-S1 bilaterally.  Aortic Atherosclerosis (ICD10-I70.0).   Electronically Signed   By: Lowella Grip III M.D.   On: 06/20/2020 13:04  I, Lynne Leader, personally (independently) visualized and performed the interpretation of the images attached in this note.  Lab Results  Component Value Date   HGBA1C 10.6 (A) 04/21/2020     Assessment and Plan: 74 y.o. female with right foot drop new.  Occurred with back pain and occurred following physical therapy.  Doubtful that the nerve root itself is traumatized during dry needling as they would be very challenging to do.  It is possible the muscle spasms associated with the dry needling may have worsened existing bulging disc.  Regardless she does have a new foot drop.  Plan for MRI to further characterize cause of foot drop and for potential epidural steroid injection planning. Also will discuss potential treatment.  We will start prednisone now as that should be helpful and worth the risk of hyperglycemia.  Discussed modifying insulin dose in response to the hyperglycemia.  Recheck following MRI.  Additionally will prescribe AFO device for biotech prosthesis.   PDMP not reviewed this encounter. Orders Placed This Encounter  Procedures  . MR Lumbar Spine Wo Contrast    Standing Status:   Future    Standing Expiration  Date:   08/17/2021    Order Specific Question:   What is the patient's sedation requirement?    Answer:   No Sedation    Order Specific Question:   Does the patient have a pacemaker or implanted devices?    Answer:   No    Order Specific Question:   Preferred imaging location?    Answer:   Product/process development scientist (table limit-350lbs)   Meds ordered this  encounter  Medications  . predniSONE (DELTASONE) 10 MG tablet    Sig: Take 3 tablets (30 mg total) by mouth daily with breakfast.    Dispense:  15 tablet    Refill:  0  . AMBULATORY NON FORMULARY MEDICATION    Sig: AFO Disp 1 right leg.  New foot drop M21.371    Dispense:  1 each    Refill:  0     Discussed warning signs or symptoms. Please see discharge instructions. Patient expresses understanding.   The above documentation has been reviewed and is accurate and complete Lynne Leader, M.D.

## 2020-08-17 NOTE — Patient Instructions (Signed)
Thank you for coming in today.  Take the prednisone now.  You may need to increase the insulin a bit for the higher blood sugar with the prednisone.   Get AFO at [facility].  [facility]  [place]  [phone]  Get MRI.  Recheck after MRI.   Keep me updated.

## 2020-08-22 ENCOUNTER — Encounter: Payer: Medicare Other | Admitting: Physical Therapy

## 2020-08-24 ENCOUNTER — Other Ambulatory Visit: Payer: Self-pay

## 2020-08-24 ENCOUNTER — Encounter: Payer: Self-pay | Admitting: Physical Therapy

## 2020-08-24 ENCOUNTER — Encounter: Payer: Self-pay | Admitting: Physician Assistant

## 2020-08-24 ENCOUNTER — Ambulatory Visit (INDEPENDENT_AMBULATORY_CARE_PROVIDER_SITE_OTHER): Payer: Medicare Other | Admitting: Physical Therapy

## 2020-08-24 DIAGNOSIS — M545 Low back pain, unspecified: Secondary | ICD-10-CM

## 2020-08-24 DIAGNOSIS — M25551 Pain in right hip: Secondary | ICD-10-CM | POA: Diagnosis not present

## 2020-08-24 NOTE — Therapy (Signed)
[facility] [address] [place], [place], [address] Phone: [phone]   Fax:  [phone]  Physical Therapy Treatment/Re-Cert/ Progress note   Patient Details  Name: Catherine Mcconnell MRN: [id number] Date of Birth: 02-15-1946 Referring Provider (PT): Inda Coke   Physical Therapy Progress Note  Dates of Reporting Period: 07/04/20 to 08/24/20     Encounter Date: 08/24/2020   PT End of Session - 08/24/20 0959    Visit Number 10    Number of Visits 20    Date for PT Re-Evaluation 09/28/20    Authorization Type UHC    PT Start Time 0939    PT Stop Time 1020    PT Time Calculation (min) 41 min    Activity Tolerance Patient tolerated treatment well    Behavior During Therapy [facility] for tasks assessed/performed           Past Medical History:  Diagnosis Date  . Arthritis   . Chronic kidney disease   . Diabetes mellitus without complication ([place])   . Hypertension   . Iron deficiency    on oral iron supplements, has not required transfusion  . Mixed hyperlipidemia   . OSA on CPAP    compliant  . Thyroid nodule    removed    Past Surgical History:  Procedure Laterality Date  . ABDOMINAL HYSTERECTOMY  1996  . MASS EXCISION Right 2017   right arm mass removal  . THYROID SURGERY     left side removal benign nodule    There were no vitals filed for this visit.   Subjective Assessment - 08/24/20 0952    Subjective Pt states no pain in back or hip for several days.  Does think L lower leg is "skinny now". Pt states when needling her R glute a couple weeks ago , she felt pain down to her toes. Has had no pain in glute or LE in past couple weeks. Discussed Travells trigger point/ pain referral patterns for glute and piriformis, and that this in a normal pain referral pattern when performing dry needling.    Patient Stated Goals decreased pain    Currently in Pain? No/denies    Pain Score 0-No pain               [facility] PT Assessment - 08/24/20 0001      Strength   Overall Strength Comments R ankle DF: 3 to 3-/5;  L: 4+/5 ;   Hips: 4 to 4-/5 for flex and abd;                           [facility] Adult PT Treatment/Exercise - 08/24/20 1004      Self-Care   Other Self-Care Comments  --      Lumbar Exercises: Stretches   Single Knee to Chest Stretch 3 reps;30 seconds    Single Knee to Chest Stretch Limitations manual      Lumbar Exercises: Aerobic   Recumbent Bike L1 x 8 min    states L leg is tired today with bike      Lumbar Exercises: Standing   Heel Raises --    Lifting --    Lifting Limitations --    Other Standing Lumbar Exercises --      Lumbar Exercises: Seated   Sit to Stand --    Other Seated Lumbar Exercises seated HR and seated DF arom for ankle strength x 15 each bil;  Lumbar Exercises: Supine   Clam 20 reps    Clam Limitations GTB with TA    Bridge 20 reps    Straight Leg Raise 15 reps    Straight Leg Raises Limitations bil      Lumbar Exercises: Sidelying   Hip Abduction Both;15 reps      Manual Therapy   Manual therapy comments no pain to palpate gr troch or ITB today     Manual Traction For bil  hip and lumbar x 2 min ea                    PT Short Term Goals - 07/18/20 1114      PT SHORT TERM GOAL #1   Title Pt to be independent with initial HEP    Time 2    Period Weeks    Status Achieved    Target Date 07/18/20             PT Long Term Goals - 08/24/20 1031      PT LONG TERM GOAL #1   Title Pt to be independent with final HEP    Time 5    Period Weeks    Status Partially Met    Target Date 09/28/20      PT LONG TERM GOAL #2   Title Pt to report decreased pain in R hip and back to 0-2/10 with standing and walking activity    Time 5    Period Weeks    Status Partially Met    Target Date 09/28/20      PT LONG TERM GOAL #3   Title Pt to demo improved hip strength to at least 4+/5 to improve stability, gait, and pain     Time 5    Period Weeks    Status On-going    Target Date 09/28/20      PT LONG TERM GOAL #4   Title Pt to demo soft tissue restrictions in R glute to be WNL , to improve pain and function    Time 5    Period Weeks    Status Achieved    Target Date 09/28/20                 Plan - 08/24/20 1033    Clinical Impression Statement Pt did put small L heel lift back in shoe to continue to try. Pt has been seen for 10 visits. She has much improved pain in back and hip in the last 1-2 weeks. Pt showing improvements with ability for ther ex, but continues to have weakness in hips R>L, and will continue to benefit from progressive strengthening for this. Ankle strengthening added due to pt concern for foot weakness. Pt saw MD last week, is scheduled for lumbar MRI this week. Pt does not demonstrate any increased weakness today in hips or feet, from previous visit. Pt to benefit from continuation of skilled PT to continue strengthening and progress to d/c to HEP in next couple weeks. Pt in agreement with plan. Pt denies any no other concerns or questions today.    Personal Factors and Comorbidities Time since onset of injury/illness/exacerbation    Examination-Activity Limitations Locomotion Level;Bend;Squat;Stairs;Stand;Lift    Examination-Participation Restrictions Cleaning;Community Activity;Shop;Yard Work    Stability/Clinical Decision Making Stable/Uncomplicated    Rehab Potential Good    PT Frequency 2x / week    PT Duration 6 weeks    PT Treatment/Interventions ADLs/Self Care Home Management;Electrical Stimulation;Cryotherapy;Iontophoresis 2m/ml Dexamethasone;Moist Heat;Traction;DME Instruction;Gait  training;Neuromuscular re-education;Balance training;Therapeutic exercise;Functional mobility training;Stair training;Patient/family education;Manual techniques;Taping;Dry needling;Passive range of motion;Spinal Manipulations;Joint Manipulations    Consulted and Agree with Plan of Care  Patient           Patient will benefit from skilled therapeutic intervention in order to improve the following deficits and impairments:  Abnormal gait, Decreased range of motion, Difficulty walking, Increased muscle spasms, Decreased activity tolerance, Pain, Impaired flexibility, Improper body mechanics, Decreased mobility, Decreased strength, Postural dysfunction  Visit Diagnosis: Acute right-sided low back pain without sciatica  Pain in right hip     Problem List Patient Active Problem List   Diagnosis Date Noted  . Type 2 diabetes mellitus with hyperglycemia, with long-term current use of insulin ([place]) 06/06/2020  . Memory deficits 03/16/2020  . Diabetes mellitus without complication ([place]) 22/40/0180  . OSA on CPAP   . Chronic kidney disease   . Hypertension   . Mixed hyperlipidemia     [name], PT, DPT 10:46 AM  08/24/20    [facility] [place], [place], [address] Phone: [phone]   Fax:  [phone]  Name: Catherine Mcconnell MRN: [id number] Date of Birth: 1946-05-31

## 2020-08-26 ENCOUNTER — Other Ambulatory Visit: Payer: Self-pay

## 2020-08-26 ENCOUNTER — Ambulatory Visit (HOSPITAL_BASED_OUTPATIENT_CLINIC_OR_DEPARTMENT_OTHER)
Admission: RE | Admit: 2020-08-26 | Discharge: 2020-08-26 | Disposition: A | Discharge status: Home / Self Care | Payer: Medicare Other | Source: Ambulatory Visit | Attending: Family Medicine | Admitting: Family Medicine

## 2020-08-26 DIAGNOSIS — M21371 Foot drop, right foot: Secondary | ICD-10-CM | POA: Insufficient documentation

## 2020-08-26 DIAGNOSIS — M5416 Radiculopathy, lumbar region: Secondary | ICD-10-CM

## 2020-08-28 ENCOUNTER — Telehealth: Payer: Self-pay | Admitting: Physician Assistant

## 2020-08-28 NOTE — Progress Notes (Signed)
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  Recommend return to clinic to discuss the MRI results in detail review the imaging and plan for next steps.

## 2020-08-28 NOTE — Telephone Encounter (Signed)
Pt daughter called, checking to see if we have reviewed MRI done this weekend.  Please call after 2pm ( heading to an appt )  [phone] [place].

## 2020-08-29 ENCOUNTER — Other Ambulatory Visit: Payer: Self-pay

## 2020-08-29 ENCOUNTER — Ambulatory Visit (INDEPENDENT_AMBULATORY_CARE_PROVIDER_SITE_OTHER): Payer: Medicare Other | Admitting: Family Medicine

## 2020-08-29 ENCOUNTER — Encounter: Payer: Self-pay | Admitting: Physical Therapy

## 2020-08-29 ENCOUNTER — Encounter: Payer: Self-pay | Admitting: Family Medicine

## 2020-08-29 ENCOUNTER — Ambulatory Visit (INDEPENDENT_AMBULATORY_CARE_PROVIDER_SITE_OTHER): Payer: Medicare Other | Admitting: Physical Therapy

## 2020-08-29 VITALS — BP 144/86 | HR 82 | Ht 66.0 in | Wt 161.4 lb

## 2020-08-29 DIAGNOSIS — M545 Low back pain, unspecified: Secondary | ICD-10-CM

## 2020-08-29 DIAGNOSIS — M48061 Spinal stenosis, lumbar region without neurogenic claudication: Secondary | ICD-10-CM | POA: Diagnosis not present

## 2020-08-29 DIAGNOSIS — M25551 Pain in right hip: Secondary | ICD-10-CM

## 2020-08-29 DIAGNOSIS — M5416 Radiculopathy, lumbar region: Secondary | ICD-10-CM | POA: Diagnosis not present

## 2020-08-29 DIAGNOSIS — M21371 Foot drop, right foot: Secondary | ICD-10-CM | POA: Diagnosis not present

## 2020-08-29 NOTE — Telephone Encounter (Signed)
Pt scheduled for MRI f/u appt with Dr. Georgina Snell for 11/16.

## 2020-08-29 NOTE — Patient Instructions (Signed)
Access Code: [email or web address] URL: [email or web address] Date: 08/29/2020 Prepared by: [name]  Exercises Supine Single Knee to Chest Stretch - 2 x daily - 3 reps - 30 hold Supine Figure 4 Piriformis Stretch with Leg Extension - 2 x daily - 3 reps - 30 hold Seated Piriformis Stretch with Trunk Bend - 1 x daily - 3 reps - 30 hold Sidelying Hip Abduction - 1 x daily - 2 sets - 10 reps Supine March - 1 x daily - 2 sets - 10 reps Supine Bridge - 1 x daily - 2 sets - 10 reps Hooklying Clamshell with Resistance - 1 x daily - 2 sets - 10 reps Standing March with Unilateral Counter Support - 1 x daily - 2 sets - 10 reps Standing Hip Abduction with Counter Support - 1 x daily - 2 sets - 10 reps Standing Heel Raises - 2 x daily - 1-2 sets - 10 reps Supine Active Ankle Pumps - 2 x daily - 1-2 sets - 10 reps

## 2020-08-29 NOTE — Patient Instructions (Addendum)
Thank you for coming in today.  Please call [facility] at [phone] to schedule your spine injection.   I have referred to Dr Marcello Moores at Neurosurgery.  You should hear soon about scheduling.   Let me know if you have a problem.  STOP aspirin now.   Let me know how things go.    Epidural Steroid Injection  An epidural steroid injection is a shot of steroid medicine and numbing medicine that is given into the space between the spinal cord and the bones of the back (epidural space). The shot helps relieve pain caused by an irritated or swollen nerve root. The amount of pain relief you get from the injection depends on what is causing the nerve to be swollen and irritated, and how long your pain lasts. You are more likely to benefit from this injection if your pain is strong and comes on suddenly rather than if you have had long-term (chronic) pain. Tell a health care provider about:  Any allergies you have.  All medicines you are taking, including vitamins, herbs, eye drops, creams, and over-the-counter medicines.  Any problems you or family members have had with anesthetic medicines.  Any blood disorders you have.  Any surgeries you have had.  Any medical conditions you have.  Whether you are pregnant or may be pregnant. What are the risks? Generally, this is a safe procedure. However, problems may occur, including:  Headache.  Bleeding.  Infection.  Allergic reaction to medicines.  Nerve damage. What happens before the procedure? Staying hydrated Follow instructions from your health care provider about hydration, which may include:  Up to 2 hours before the procedure - you may continue to drink clear liquids, such as water, clear fruit juice, black coffee, and plain tea. Eating and drinking restrictions Follow instructions from your health care provider about eating and drinking, which may include:  8 hours before the procedure - stop eating heavy meals or  foods, such as meat, fried foods, or fatty foods.  6 hours before the procedure - stop eating light meals or foods, such as toast or cereal.  6 hours before the procedure - stop drinking milk or drinks that contain milk.  2 hours before the procedure - stop drinking clear liquids. Medicines  You may be given medicines to lower anxiety.  Ask your health care provider about: ? Changing or stopping your regular medicines. This is especially important if you are taking diabetes medicines or blood thinners. ? Taking medicines such as aspirin and ibuprofen. These medicines can thin your blood. Do not take these medicines unless your health care provider tells you to take them. ? Taking over-the-counter medicines, vitamins, herbs, and supplements.  Ask your health care provider what steps will be taken to prevent infection. General instructions  Plan to have someone take you home from the hospital or clinic.  If you will be going home right after the procedure, plan to have someone with you for 24 hours. What happens during the procedure?  An IV will be inserted into one of your veins.  You will be given one or more of the following: ? A medicine to help you relax (sedative). ? A medicine to numb the area (local anesthetic).  You will be asked to lie on your abdomen or sit.  The injection site will be cleaned.  A needle will be inserted through your skin into the epidural space. This may cause you some discomfort. An X-ray machine will be used to guide  the needle as close as possible to the affected nerve.  A steroid medicine and a local anesthetic will be injected into the epidural space.  The needle and IV will be removed.  A bandage (dressing) will be put over the injection site. The procedure may vary among health care providers and hospitals. What can I expect after the procedure? Follow these instructions at home: Injection site care  You may remove the bandage (dressing)  after 24 hours.  Check your injection site every day for signs of infection. Check for: ? Redness, swelling, or pain. ? Fluid or blood. ? Warmth. ? Pus or a bad smell. Managing pain, stiffness, and swelling  For 24 hours after the procedure: ? Avoid using heat on the injection site. ? Do not take baths, swim, or use a hot tub until your health care provider approves. Ask your health care provider if you may take a shower. You may only be allowed to take sponge baths.  If directed, put ice on the injection site. To do this: ? Put ice in a plastic bag. ? Place a towel between your skin and the bag. ? Leave the ice on for 20 minutes, 2-3 times a day.  Activity  Do not drive for 24 hours if you were given a sedative during your procedure.  Return to your normal activities as told by your health care provider. Ask your health care provider what activities are safe for you. General instructions  Your blood pressure, heart rate, breathing rate, and blood oxygen level will be monitored until you leave the hospital or clinic.  Your arm or leg may feel weak or numb for a few hours.  The injection site may feel sore.  Take over-the-counter and prescription medicines only as told by your health care provider.  Drink enough fluid to keep your urine pale yellow.  Keep all follow-up visits as told by your health care provider. This is important. Contact a health care provider if:  You have any of these signs of infection: ? Redness, swelling, or pain around your injection site. ? Fluid or blood coming from your injection site. ? Warmth coming from your injection site. ? Pus or a bad smell coming from your injection site. ? A fever.  You continue to have pain and soreness around the injection site, even after taking over-the-counter pain medicine.  You have severe, sudden, or lasting nausea or vomiting. Get help right away if:  You have severe pain at the injection site that is not  relieved by medicines.  You develop a severe headache or a stiff neck.  You become sensitive to light.  You have any new numbness or weakness in your legs or arms.  You lose control of your bladder or bowel movements.  You have trouble breathing. Summary  An epidural steroid injection is a shot of steroid medicine and numbing medicine that is given into the epidural space.  The shot helps relieve pain caused by an irritated or swollen nerve root.  You are more likely to benefit from this injection if your pain is strong and comes on suddenly rather than if you have had chronic pain. This information is not intended to replace advice given to you by your health care provider. Make sure you discuss any questions you have with your health care provider. Document Revised: 04/12/2019 Document Reviewed: 04/12/2019 Elsevier Patient Education  [place].    Spinal Stenosis  Spinal stenosis happens when the open space (spinal  canal) between the bones of your spine (vertebrae) gets smaller. It is caused by bone pushing into the open spaces of your backbone (spine). This puts pressure on your backbone and the nerves in your backbone. Treatment often focuses on managing any pain and symptoms. In some cases, surgery may be needed. Follow these instructions at home: Managing pain, stiffness, and swelling   Do all exercises and stretches as told by your doctor.  Stand and sit up straight (use good posture). If you were given a brace or a corset, wear it as told by your doctor.  Do not do any activities that cause pain. Ask your doctor what activities are safe for you.  Do not lift anything that is heavier than 10 lb (4.5 kg) or heavier than your doctor tells you.  Try to stay at a healthy weight. Talk with your doctor if you need help losing weight.  If directed, put heat on the affected area as often as told by your doctor. Use the heat source that your doctor recommends, such as  a moist heat pack or a heating pad. ? Put a towel between your skin and the heat source. ? Leave the heat on for 20-30 minutes. ? Remove the heat if your skin turns bright red. This is especially important if you are not able to feel pain, heat, or cold. You may have a greater risk of getting burned. General instructions  Take over-the-counter and prescription medicines only as told by your doctor.  Do not use any products that contain nicotine or tobacco, such as cigarettes and e-cigarettes. If you need help quitting, ask your doctor.  Eat a healthy diet. This includes plenty of fruits and vegetables, whole grains, and low-fat (lean) protein.  Keep all follow-up visits as told by your doctor. This is important. Contact a doctor if:  Your symptoms do not get better.  Your symptoms get worse.  You have a fever. Get help right away if:  You have new or worse pain in your neck or upper back.  You have very bad pain that medicine does not control.  You are dizzy.  You have vision problems, blurred vision, or double vision.  You have a very bad headache that is worse when you stand.  You feel sick to your stomach (nauseous).  You throw up (vomit).  You have new or worse numbness or tingling in your back or legs.  You have pain, redness, swelling, or warmth in your arm or leg. Summary  Spinal stenosis happens when the open space (spinal canal) between the bones of your spine gets smaller (narrow).  Contact a doctor if your symptoms get worse.  In some cases, surgery may be needed. This information is not intended to replace advice given to you by your health care provider. Make sure you discuss any questions you have with your health care provider. Document Revised: 09/12/2017 Document Reviewed: 09/04/2016 Elsevier Patient Education  2020 [employer].

## 2020-08-29 NOTE — Therapy (Signed)
[facility] [address] [place], [place], [address] Phone: [phone]   Fax:  [phone]  Physical Therapy Treatment  Patient Details  Name: Catherine Mcconnell MRN: [id number] Date of Birth: 1945-10-16 Referring Provider (PT): Inda Coke   Encounter Date: 08/29/2020   PT End of Session - 08/29/20 0944    Visit Number 11    Number of Visits 20    Date for PT Re-Evaluation 09/28/20    Authorization Type UHC    PT Start Time 0932    PT Stop Time 1014    PT Time Calculation (min) 42 min    Activity Tolerance Patient tolerated treatment well    Behavior During Therapy [facility] for tasks assessed/performed           Past Medical History:  Diagnosis Date  . Arthritis   . Chronic kidney disease   . Diabetes mellitus without complication ([place])   . Hypertension   . Iron deficiency    on oral iron supplements, has not required transfusion  . Mixed hyperlipidemia   . OSA on CPAP    compliant  . Thyroid nodule    removed    Past Surgical History:  Procedure Laterality Date  . ABDOMINAL HYSTERECTOMY  1996  . MASS EXCISION Right 2017   right arm mass removal  . THYROID SURGERY     left side removal benign nodule    There were no vitals filed for this visit.   Subjective Assessment - 08/29/20 0943    Subjective Pt states still has no pain in back or hip. Does feel "heaviness" in L foot, but states no swelling "feels swollen". R foot still feels weak. Pt going for MD visit today for MRI f/u.    Currently in Pain? No/denies    Pain Score 0-No pain                             [facility] Adult PT Treatment/Exercise - 08/29/20 0945      Lumbar Exercises: Stretches   Single Knee to Chest Stretch 3 reps;30 seconds    Single Knee to Chest Stretch Limitations --      Lumbar Exercises: Aerobic   Recumbent Bike L2 x 8 min      Lumbar Exercises: Standing   Heel Raises 20 reps    Functional Squats 15 reps     Other Standing Lumbar Exercises March , hip abd , 2x 10 each     Other Standing Lumbar Exercises Toe taps 6 in step x 20; Step ups 6 in x 10 bil;       Lumbar Exercises: Seated   Other Seated Lumbar Exercises seated DF arom for ankle strength x 15 each       Lumbar Exercises: Supine   Clam --    Clam Limitations --    Bridge --    Straight Leg Raise 15 reps    Straight Leg Raises Limitations bil      Lumbar Exercises: Sidelying   Hip Abduction Both;15 reps      Manual Therapy   Manual therapy comments --    Manual Traction --                    PT Short Term Goals - 07/18/20 1114      PT SHORT TERM GOAL #1   Title Pt to be independent with initial HEP    Time 2  Period Weeks    Status Achieved    Target Date 07/18/20             PT Long Term Goals - 08/24/20 1031      PT LONG TERM GOAL #1   Title Pt to be independent with final HEP    Time 5    Period Weeks    Status Partially Met    Target Date 09/28/20      PT LONG TERM GOAL #2   Title Pt to report decreased pain in R hip and back to 0-2/10 with standing and walking activity    Time 5    Period Weeks    Status Partially Met    Target Date 09/28/20      PT LONG TERM GOAL #3   Title Pt to demo improved hip strength to at least 4+/5 to improve stability, gait, and pain    Time 5    Period Weeks    Status On-going    Target Date 09/28/20      PT LONG TERM GOAL #4   Title Pt to demo soft tissue restrictions in R glute to be WNL , to improve pain and function    Time 5    Period Weeks    Status Achieved    Target Date 09/28/20                 Plan - 08/29/20 1638    Clinical Impression Statement Pt continues to have no pain in back or hip, but does still have weakness in hips and LEs. Pt still very challenged with strengthening exercises, but improving. Pt to benefit from continued strengthening.    Personal Factors and Comorbidities Time since onset of injury/illness/exacerbation     Examination-Activity Limitations Locomotion Level;Bend;Squat;Stairs;Stand;Lift    Examination-Participation Restrictions Cleaning;Community Activity;Shop;Yard Work    Stability/Clinical Decision Making Stable/Uncomplicated    Rehab Potential Good    PT Frequency 2x / week    PT Duration 6 weeks    PT Treatment/Interventions ADLs/Self Care Home Management;Electrical Stimulation;Cryotherapy;Iontophoresis 58m/ml Dexamethasone;Moist Heat;Traction;DME Instruction;Gait training;Neuromuscular re-education;Balance training;Therapeutic exercise;Functional mobility training;Stair training;Patient/family education;Manual techniques;Taping;Dry needling;Passive range of motion;Spinal Manipulations;Joint Manipulations    Consulted and Agree with Plan of Care Patient           Patient will benefit from skilled therapeutic intervention in order to improve the following deficits and impairments:  Abnormal gait, Decreased range of motion, Difficulty walking, Increased muscle spasms, Decreased activity tolerance, Pain, Impaired flexibility, Improper body mechanics, Decreased mobility, Decreased strength, Postural dysfunction  Visit Diagnosis: Acute right-sided low back pain without sciatica  Pain in right hip     Problem List Patient Active Problem List   Diagnosis Date Noted  . Type 2 diabetes mellitus with hyperglycemia, with long-term current use of insulin ([place] 06/06/2020  . Memory deficits 03/16/2020  . Diabetes mellitus without complication ([place] [id number] . OSA on CPAP   . Chronic kidney disease   . Hypertension   . Mixed hyperlipidemia    LLyndee Hensen PT, DPT 4:39 PM  08/29/20    [facility] [place] [address]Phone: [phone]  Fax:  [phone] Name: Catherine SeiferMRN: [id number] of Birth: 122-Mar-1947

## 2020-08-29 NOTE — Progress Notes (Signed)
I, Peterson Lombard, LAT, ATC acting as a scribe for Lynne Leader, MD.  Catherine Mcconnell is a 74 y.o. female who presents to [place] [facility] today for f/u R foot drop, lumbar radiculopathy, and MRI review. Pt was last seen by Dr. Georgina Snell on 08/17/20 and was advised to plan for MRI to further characterize cause of foot drop, start prednisone, and was prescribe AFO device for biotech prosthesis. Today, pt reports no LBPn. The foot drop is the same on R and L foot feels swollen.   Dx imaging: 08/26/20 L-spine MRI  Pertinent review of systems: No fevers or chills  Relevant historical information: Diabetes   Exam:  BP (!) 144/86 (BP Location: Left Arm, Patient Position: Sitting, Cuff Size: Normal)   Pulse 82   Ht 5\' 6"  (1.676 m)   Wt 161 lb 6.4 oz (73.2 kg)   SpO2 98%   BMI 26.05 kg/m  General: Well Developed, well nourished, and in no acute distress.   MSK: Continued right foot drop    Lab and Radiology Results No results found for this or any previous visit (from the past 72 hour(s)). MR Lumbar Spine Wo Contrast  Result Date: 08/27/2020 CLINICAL DATA:  Bilateral foot tingling and swelling. EXAM: MRI LUMBAR SPINE WITHOUT CONTRAST TECHNIQUE: Multiplanar, multisequence MR imaging of the lumbar spine was performed. No intravenous contrast was administered. COMPARISON:  Radiography 06/20/2020 FINDINGS: Segmentation:  5 lumbar type vertebral bodies. Alignment: Mild curvature convex to the right with the apex at L3. 3 mm degenerative anterolisthesis L3-4. Vertebrae: No fracture or primary bone lesion. Chronic discogenic endplate marrow changes at L5-S1 without active edema. Conus medullaris and cauda equina: Conus extends to the L1 level. Conus and cauda equina appear normal. Paraspinal and other soft tissues: Negative Disc levels: No disc abnormality at L2-3 or above. Minimal facet hypertrophy at L2-3 but no stenosis. L3-4: Bilateral facet degeneration with facet and  ligamentous hypertrophy allowing 3 mm of anterolisthesis. Disc degeneration with broad-based protrusion of the disc. Severe spinal stenosis at this level that could cause neural compression on either or both sides. Foraminal encroachment left worse than right. L4-5: Shallow disc protrusion with slight caudal down turning in the midline. Bilateral facet and ligamentous hypertrophy. Stenosis of both lateral recesses, but without definite neural compression. L5-S1: Chronic disc degeneration with loss of disc height. Endplate osteophytes and bulging of the disc. No apparent compressive canal or foraminal narrowing. IMPRESSION: 1. L3-4: Bilateral facet arthropathy with 3 mm of anterolisthesis. Broad-based protrusion of the disc. Severe multifactorial spinal stenosis that could cause neural compression on either or both sides. Foraminal encroachment left worse than right. 2. L4-5: Shallow disc protrusion with slight caudal down turning in the midline. Bilateral facet and ligamentous hypertrophy. Mild stenosis of both lateral recesses, but without definite neural compression. 3. L5-S1: Chronic disc degeneration with loss of disc height. Endplate osteophytes and bulging of the disc. No apparent compressive stenosis. Electronically Signed   By: Nelson Chimes M.D.   On: 08/27/2020 08:49  I, Lynne Leader, personally (independently) visualized and performed the interpretation of the images attached in this note.      Assessment and Plan: 74 y.o. female with right foot drop.  MRI shows severe spinal stenosis at L3-L4.  This could potentially explain the foot drop.  She does have some neuroforaminal stenosis at L4-L5 and L5-S1 as well.  Discussed options.  Plan for epidural steroid injection.  Based on the severity of her stenosis on  MRI and her weakness will refer to neurosurgery.  Her daughter has a relationship with Dr. Marcello Moores so I will refer to him.  She should at least have a discussion regarding surgical  options.   PDMP not reviewed this encounter. Orders Placed This Encounter  Procedures  . DG INJECT DIAG/THERA/INC NEEDLE/CATH/PLC EPI/LUMB/SAC W/IMG    Standing Status:   Future    Standing Expiration Date:   08/29/2021    Order Specific Question:   Reason for Exam (SYMPTOM  OR DIAGNOSIS REQUIRED)    Answer:   right foot drop. Level and technique per radiology    Order Specific Question:   Preferred Imaging Location?    Answer:   [id number]    Order Specific Question:   Radiology Contrast Protocol - do NOT remove file path    Answer:   [email or web address]  . Ambulatory referral to Neurosurgery    Referral Priority:   Routine    Referral Type:   Surgical    Referral Reason:   Specialty Services Required    Referred to Provider:   Vallarie Mare, MD    Requested Specialty:   Neurosurgery    Number of Visits Requested:   1   No orders of the defined types were placed in this encounter.    Discussed warning signs or symptoms. Please see discharge instructions. Patient expresses understanding.   The above documentation has been reviewed and is accurate and complete Lynne Leader, M.D.

## 2020-08-31 ENCOUNTER — Other Ambulatory Visit: Payer: Self-pay

## 2020-08-31 ENCOUNTER — Ambulatory Visit (INDEPENDENT_AMBULATORY_CARE_PROVIDER_SITE_OTHER): Payer: Medicare Other | Admitting: Physical Therapy

## 2020-08-31 ENCOUNTER — Encounter: Payer: Self-pay | Admitting: Physical Therapy

## 2020-08-31 DIAGNOSIS — M545 Low back pain, unspecified: Secondary | ICD-10-CM | POA: Diagnosis not present

## 2020-08-31 DIAGNOSIS — M25551 Pain in right hip: Secondary | ICD-10-CM | POA: Diagnosis not present

## 2020-08-31 NOTE — Therapy (Signed)
[facility] [address] [place], [place], [address] Phone: [phone]   Fax:  [phone]  Physical Therapy Treatment/Discharge  Patient Details  Name: Catherine Mcconnell MRN: [id number] Date of Birth: 10-19-1945 Referring Provider (PT): Inda Coke   Encounter Date: 08/31/2020   PT End of Session - 08/31/20 2226    Visit Number 12    Number of Visits 20    Date for PT Re-Evaluation 09/28/20    Authorization Type U[employer]    PT Start Time 0933    PT Stop Time 1012    PT Time Calculation (min) 39 min    Activity Tolerance Patient tolerated treatment well    Behavior During Therapy [facility] for tasks assessed/performed           Past Medical History:  Diagnosis Date  . Arthritis   . Chronic kidney disease   . Diabetes mellitus without complication ([place])   . Hypertension   . Iron deficiency    on oral iron supplements, has not required transfusion  . Mixed hyperlipidemia   . OSA on CPAP    compliant  . Thyroid nodule    removed    Past Surgical History:  Procedure Laterality Date  . ABDOMINAL HYSTERECTOMY  1996  . MASS EXCISION Right 2017   right arm mass removal  . THYROID SURGERY     left side removal benign nodule    There were no vitals filed for this visit.   Subjective Assessment - 08/31/20 2158    Subjective Pt states she has been doing HEP. States "feeling" in feet is better. Has no back or hip pain.    Currently in Pain? No/denies              [facility] PT Assessment - 08/31/20 0001      Strength   Overall Strength Comments R ankle DF: 3-/5;  L: 4+/5 ;   Hips: 4/5 for flex and abd;                           [facility] Adult PT Treatment/Exercise - 08/31/20 0001      Self-Care   Other Self-Care Comments  Discussed possible use of foot-up orthosis for weakness in R DF.       Lumbar Exercises: Stretches   Single Knee to Chest Stretch 3 reps;30 seconds    Double Knee to Chest Stretch 2  reps;30 seconds      Lumbar Exercises: Aerobic   Recumbent Bike L2 x 8 min      Lumbar Exercises: Standing   Heel Raises 20 reps    Other Standing Lumbar Exercises March , hip abd , 2x 10 each     Other Standing Lumbar Exercises Step ups 6 in x 10 bil;       Lumbar Exercises: Seated   Other Seated Lumbar Exercises seated DF arom for ankle strength x 15 each       Lumbar Exercises: Supine   Clam 20 reps    Clam Limitations GTB with TA    Bridge 20 reps    Straight Leg Raise 15 reps    Straight Leg Raises Limitations bil      Lumbar Exercises: Sidelying   Hip Abduction Both;15 reps                  PT Education - 08/31/20 2225    Education Details HEP reviewed.    Person(s) Educated Patient  Methods Explanation;Demonstration;Verbal cues    Comprehension Verbalized understanding;Returned demonstration;Verbal cues required            PT Short Term Goals - 07/18/20 1114      PT SHORT TERM GOAL #1   Title Pt to be independent with initial HEP    Time 2    Period Weeks    Status Achieved    Target Date 07/18/20             PT Long Term Goals - 08/31/20 2226      PT LONG TERM GOAL #1   Title Pt to be independent with final HEP    Time 5    Period Weeks    Status Achieved      PT LONG TERM GOAL #2   Title Pt to report decreased pain in R hip and back to 0-2/10 with standing and walking activity    Time 5    Period Weeks    Status Achieved      PT LONG TERM GOAL #3   Title Pt to demo improved hip strength to at least 4+/5 to improve stability, gait, and pain    Time 5    Period Weeks    Status Partially Met      PT LONG TERM GOAL #4   Title Pt to demo soft tissue restrictions in R glute to be WNL , to improve pain and function    Time 5    Period Weeks    Status Achieved                 Plan - 08/31/20 2229    Clinical Impression Statement Pt reports being able to do all regular home/functional activities without pain. She has met  goals at this time. She does continue to have some weakness in hips and R foot, that she will continue to address with HEP. Reviewed final HEP and its importance today. Discussed use of foot up orthotis if pt feels she is having diffiuclty with walking, due to weakness with R dorsiflexion, pt still able to perform active ROM for DF at this time, past neutral, but not full ROM against gravity. Pt ready for d/c a this time, pt in agreement with plan. She will continue to f/u wtih MD and neurosurgery for lumbar findings on MRI.    Personal Factors and Comorbidities Time since onset of injury/illness/exacerbation    Examination-Activity Limitations Locomotion Level;Bend;Squat;Stairs;Stand;Lift    Examination-Participation Restrictions Cleaning;Community Activity;Shop;Yard Work    Stability/Clinical Decision Making Stable/Uncomplicated    Rehab Potential Good    PT Frequency 2x / week    PT Duration 6 weeks    PT Treatment/Interventions ADLs/Self Care Home Management;Electrical Stimulation;Cryotherapy;Iontophoresis 13m/ml Dexamethasone;Moist Heat;Traction;DME Instruction;Gait training;Neuromuscular re-education;Balance training;Therapeutic exercise;Functional mobility training;Stair training;Patient/family education;Manual techniques;Taping;Dry needling;Passive range of motion;Spinal Manipulations;Joint Manipulations    Consulted and Agree with Plan of Care Patient           Patient will benefit from skilled therapeutic intervention in order to improve the following deficits and impairments:  Abnormal gait, Decreased range of motion, Difficulty walking, Increased muscle spasms, Decreased activity tolerance, Pain, Impaired flexibility, Improper body mechanics, Decreased mobility, Decreased strength, Postural dysfunction  Visit Diagnosis: Acute right-sided low back pain without sciatica  Pain in right hip     Problem List Patient Active Problem List   Diagnosis Date Noted  . Type 2 diabetes  mellitus with hyperglycemia, with long-term current use of insulin ([place] 06/06/2020  . Memory deficits  03/16/2020  . Diabetes mellitus without complication ([place]) 87/18/3672  . OSA on CPAP   . Chronic kidney disease   . Hypertension   . Mixed hyperlipidemia    [name], PT, DPT 10:35 PM  08/31/20   [facility] [place], [place], [address] Phone: [phone]   Fax:  [phone]  Name: Alexandrya Chim MRN: [id number] Date of Birth: [birth year]   PHYSICAL THERAPY DISCHARGE SUMMARY  Visits from Start of Care: 12 Plan: Patient agrees to discharge.  Patient goals were met. Patient is being discharged due to meeting the stated rehab goals.  ?????      [name], PT, DPT 10:35 PM  08/31/20

## 2020-08-31 NOTE — Patient Instructions (Signed)
Access Code: [email or web address] URL: [email or web address] Date: 08/31/2020 Prepared by: [name]  Exercises Supine Single Knee to Chest Stretch - 2 x daily - 3 reps - 30 hold Supine Figure 4 Piriformis Stretch with Leg Extension - 2 x daily - 3 reps - 30 hold Seated Piriformis Stretch with Trunk Bend - 1 x daily - 3 reps - 30 hold Sidelying Hip Abduction - 1 x daily - 2 sets - 10 reps Supine March - 1 x daily - 2 sets - 10 reps Supine Bridge - 1 x daily - 2 sets - 10 reps Hooklying Clamshell with Resistance - 1 x daily - 2 sets - 10 reps Standing March with Unilateral Counter Support - 1 x daily - 2 sets - 10 reps Standing Hip Abduction with Counter Support - 1 x daily - 2 sets - 10 reps Standing Heel Raises - 2 x daily - 1-2 sets - 10 reps Supine Active Ankle Pumps - 2 x daily - 1-2 sets - 10 reps

## 2020-09-05 ENCOUNTER — Other Ambulatory Visit: Payer: Self-pay | Admitting: Physician Assistant

## 2020-09-05 ENCOUNTER — Other Ambulatory Visit: Payer: Self-pay

## 2020-09-05 ENCOUNTER — Ambulatory Visit
Admission: RE | Admit: 2020-09-05 | Discharge: 2020-09-05 | Disposition: A | Discharge status: Home / Self Care | Payer: Medicare Other | Source: Ambulatory Visit | Attending: Family Medicine | Admitting: Family Medicine

## 2020-09-05 DIAGNOSIS — M48061 Spinal stenosis, lumbar region without neurogenic claudication: Secondary | ICD-10-CM

## 2020-09-05 DIAGNOSIS — M5416 Radiculopathy, lumbar region: Secondary | ICD-10-CM

## 2020-09-05 DIAGNOSIS — Z1231 Encounter for screening mammogram for malignant neoplasm of breast: Secondary | ICD-10-CM

## 2020-09-05 DIAGNOSIS — M21371 Foot drop, right foot: Secondary | ICD-10-CM

## 2020-09-05 MED ORDER — IOPAMIDOL (ISOVUE-M 200) INJECTION 41%
1.0000 mL | Freq: Once | INTRAMUSCULAR | Status: AC
  Administered 2020-09-05: 1 mL via EPIDURAL

## 2020-09-05 MED ORDER — METHYLPREDNISOLONE ACETATE 40 MG/ML INJ SUSP (RADIOLOG
120.0000 mg | Freq: Once | INTRAMUSCULAR | Status: AC
  Administered 2020-09-05: 120 mg via EPIDURAL

## 2020-09-05 NOTE — Discharge Instructions (Signed)

## 2020-09-09 ENCOUNTER — Other Ambulatory Visit: Payer: Medicare Other

## 2020-09-11 ENCOUNTER — Ambulatory Visit
Admission: RE | Admit: 2020-09-11 | Discharge: 2020-09-11 | Disposition: A | Discharge status: Home / Self Care | Payer: Medicare Other | Source: Ambulatory Visit

## 2020-09-11 ENCOUNTER — Ambulatory Visit: Payer: Medicare Other | Admitting: Internal Medicine

## 2020-09-11 ENCOUNTER — Other Ambulatory Visit: Payer: Self-pay

## 2020-09-11 ENCOUNTER — Encounter: Payer: Self-pay | Admitting: Internal Medicine

## 2020-09-11 VITALS — BP 140/70 | HR 74 | Ht 66.0 in | Wt 160.2 lb

## 2020-09-11 DIAGNOSIS — N1831 Chronic kidney disease, stage 3a: Secondary | ICD-10-CM

## 2020-09-11 DIAGNOSIS — E1122 Type 2 diabetes mellitus with diabetic chronic kidney disease: Secondary | ICD-10-CM | POA: Diagnosis not present

## 2020-09-11 DIAGNOSIS — E1142 Type 2 diabetes mellitus with diabetic polyneuropathy: Secondary | ICD-10-CM | POA: Diagnosis not present

## 2020-09-11 DIAGNOSIS — E1165 Type 2 diabetes mellitus with hyperglycemia: Secondary | ICD-10-CM | POA: Diagnosis not present

## 2020-09-11 DIAGNOSIS — Z1231 Encounter for screening mammogram for malignant neoplasm of breast: Secondary | ICD-10-CM

## 2020-09-11 LAB — POCT GLYCOSYLATED HEMOGLOBIN (HGB A1C): Hemoglobin A1C: 8.5 % — AB (ref 4.0–5.6)

## 2020-09-11 MED ORDER — RYBELSUS 14 MG PO TABS: 14.0000 mg | ORAL_TABLET | Freq: Every day | ORAL | 2 refills | Status: DC

## 2020-09-11 NOTE — Progress Notes (Signed)
Name: Catherine Mcconnell  Age/ Sex: 74 y.o., female   MRN/ DOB: [id number], November 12, 1945     PCP: Inda Coke, PA   Reason for Endocrinology Evaluation: Type 2 Diabetes Mellitus  Initial Endocrine Consultative Visit: 08/10/2019    PATIENT IDENTIFIER: Catherine Mcconnell is a 74 y.o. female with a past medical history of T2DM and HTN. The patient has followed with Endocrinology clinic since 08/10/2019 for consultative assistance with management of her diabetes.  DIABETIC HISTORY:  Catherine Mcconnell was diagnosed with DM at age 74.  Her hemoglobin A1c has ranged from 7.7% in 2021, peaking at 10.6%  in 2021    She transitioned care from Dr. Loanne Drilling in  05/2020. She was on Januvia, Rybelsus and had tresiba on her med list but she has stopped it due to swelling. We stopped Januvia, increased rybelsus and restarted [place]   SUBJECTIVE:   During the last visit (): A1c 10.6 % . Stopped Januvia, increased Rybelsus and started basal insulin   Today (09/11/2020): Ms. [name] is here for a follow up on diabetes management. She is accompanied by daughter Catherine Mcconnell .  She checks her blood sugars 2 times daily. The patient has not had hypoglycemic episodes since the last clinic visit.    Had an epidural injection 09/05/2020   HOME DIABETES REGIMEN:  Rybelsus 7 mg daily  With Breakfast  Tresiba 10 units daily - has been taking 10-14 units      Statin: yes ACE-I/ARB: yes     METER DOWNLOAD SUMMARY: Date range evaluated: 11/15-11/29/2021 Average Number Tests/Day = 1.4 Overall Mean FS Glucose = 211 Standard Deviation = 85  BG Ranges: Low = 109 High = 400   Hypoglycemic Events/30 Days: BG < 50 = 0 Episodes of symptomatic severe hypoglycemia = 0    DIABETIC COMPLICATIONS: Microvascular complications:   Neuropathy, CKD III  Denies: retinopathy  Last Eye Exam: Completed   Macrovascular complications:    Denies: CAD, CVA, PVD   HISTORY:  Past Medical History:  Past Medical  History:  Diagnosis Date  . Arthritis   . Chronic kidney disease   . Diabetes mellitus without complication ([place])   . Hypertension   . Iron deficiency    on oral iron supplements, has not required transfusion  . Mixed hyperlipidemia   . OSA on CPAP    compliant  . Thyroid nodule    removed   Past Surgical History:  Past Surgical History:  Procedure Laterality Date  . ABDOMINAL HYSTERECTOMY  1996  . MASS EXCISION Right 2017   right arm mass removal  . THYROID SURGERY     left side removal benign nodule    Social History:  reports that she has quit smoking. She has never used smokeless tobacco. She reports previous alcohol use. She reports that she does not use drugs. Family History:  Family History  Problem Relation Age of Onset  . Stroke Mother   . Hypertension Mother   . Heart disease Mother   . Heart attack Father   . Early death Father   . Hypertension Father   . Diabetes Sister   . Early death Sister   . Hypertension Sister   . Stroke Sister   . Prostate cancer Son   . Cancer Son   . Early death Son   . Breast cancer Cousin   . Diabetes Sister   . Early death Sister   . Hyperlipidemia Sister   . Kidney disease Sister   . Depression  Daughter   . Hypertension Daughter   . Hyperlipidemia Daughter   . Diabetes Sister   . Hyperlipidemia Sister   . Hypertension Sister   . Early death Brother   . Arthritis Brother   . Heart attack Brother   . Heart disease Brother   . Arthritis Brother   . Diabetes Sister      HOME MEDICATIONS: Allergies as of 09/11/2020      Reactions   Lisinopril Rash   Metformin And Related Swelling      Medication List       Accurate as of September 11, 2020  9:21 AM. If you have any questions, ask your nurse or doctor.        STOP taking these medications   predniSONE 10 MG tablet Commonly known as: DELTASONE Stopped by: Dorita Sciara, MD     TAKE these medications   acetaminophen-codeine 300-30 MG  tablet Commonly known as: TYLENOL #3 Take 2 tablets by mouth every 8 (eight) hours as needed for severe pain.   AMBULATORY NON FORMULARY MEDICATION AFO Disp 1 right leg.  New foot drop M21.371   amLODipine 10 MG tablet Commonly known as: NORVASC TAKE 1 TABLET BY MOUTH  DAILY   aspirin EC 81 MG tablet Take 81 mg by mouth daily.   atorvastatin 40 MG tablet Commonly known as: LIPITOR TAKE 1 TABLET BY MOUTH  DAILY   carvedilol 6.25 MG tablet Commonly known as: COREG TAKE 1 TABLET BY MOUTH  TWICE DAILY WITH MEALS   ferrous sulfate 325 (65 FE) MG tablet Commonly known as: Iron Supplement Take 1 tablet (325 mg total) by mouth 2 (two) times daily with a meal. What changed: when to take this   furosemide 20 MG tablet Commonly known as: LASIX Take 1 tablet (20 mg total) by mouth daily as needed for edema.   gabapentin 100 MG capsule Commonly known as: NEURONTIN TAKE 3 CAPSULES BY MOUTH  DAILY   Insulin Pen Needle 32G X 4 MM Misc 1 each by Does not apply route as needed.   ketoconazole 2 % cream Commonly known as: NIZORAL Apply 1 fingertip amount to each foot daily.   losartan 50 MG tablet Commonly known as: COZAAR Take 50 mg by mouth daily.   Lumigan 0.01 % Soln Generic drug: bimatoprost Place 1 drop into both eyes every evening.   OneTouch Ultra test strip Generic drug: glucose blood USE TO TEST BLOOD SUGARS  TWICE DAILY   onetouch ultrasoft lancets CHECK TWICE PER DAY BEFORE  BREAKFAST AND DINNER   Rybelsus 7 MG Tabs Generic drug: Semaglutide Take 7 mg by mouth daily.   Tyler Aas FlexTouch 100 UNIT/ML FlexTouch Pen Generic drug: insulin degludec Inject 0.1 mLs (10 Units total) into the skin daily.        OBJECTIVE:   Vital Signs: BP 140/70   Pulse 74   Ht 5\' 6"  (1.676 m)   Wt 160 lb 4 oz (72.7 kg)   SpO2 98%   BMI 25.87 kg/m   Wt Readings from Last 3 Encounters:  09/11/20 160 lb 4 oz (72.7 kg)  08/29/20 161 lb 6.4 oz (73.2 kg)  08/17/20 156 lb  (70.8 kg)     Exam: General: Pt appears well and is in NAD  Lungs: Clear with good BS bilat with no rales, rhonchi, or wheezes  Heart: RRR with normal S1 and S2 and no gallops; no murmurs; no rub  Abdomen: Normoactive bowel sounds, soft, nontender, without masses or  organomegaly palpable  Extremities: No pretibial edema. .  Neuro: MS is good with appropriate affect, pt is alert and Ox3      DM foot exam: 06/06/2020  The skin of the feet is intact without sores or ulcerations. The pedal pulses are 2+ on right and 2+ on left. The sensation is intact to a screening 5.07, 10 gram monofilament bilaterally   DATA REVIEWED:  Lab Results  Component Value Date   HGBA1C 8.5 (A) 09/11/2020   HGBA1C 10.6 (A) 04/21/2020   HGBA1C 8.4 (A) 01/19/2020   Lab Results  Component Value Date   MICROALBUR 140.4 06/06/2020   LDLCALC 134 (H) 06/06/2020   CREATININE 1.44 (H) 06/14/2020   Lab Results  Component Value Date   MICRALBCREAT 1,543 (H) 06/06/2020     Lab Results  Component Value Date   CHOL 217 (H) 06/06/2020   HDL 61 06/06/2020   LDLCALC 134 (H) 06/06/2020   TRIG 108 06/06/2020   CHOLHDL 3.6 06/06/2020         ASSESSMENT / PLAN / RECOMMENDATIONS:   1) Type 2 Diabetes Mellitus, Poorly controlled, With neuropathic and CKD III  complications - Most recent A1c of 8.5 %. Goal A1c < 7.0 %.     - A1c down from 10.6 % I have praised the pt on great work on improving glycemic control, she has been noted with hyperglycemia this week, this is multifactorial given epidural injection followed by thanksgiving celebrations. She did increase basal insulin for a few day but now she is back to 10 units with a fasting BG 185 mg/dL.  - Since she is not having any GI side effects,will increase Rybelsus as below      MEDICATIONS:  - Increase Rybelsus to 14 mg daily  With Breakfast  - Continue Tresiba 10 units daily    EDUCATION / INSTRUCTIONS:  BG monitoring instructions: Patient is  instructed to check her blood sugars 2 times a day, fasting and bedtime .  Call [place] [facility] clinic if: BG persistently < 70  . I reviewed the Rule of 15 for the treatment of hypoglycemia in detail with the patient. Literature supplied.   2) Diabetic complications:   Eye: Does not have known diabetic retinopathy.   Neuro/ Feet: Does  have known diabetic peripheral neuropathy .   Renal: Patient does  have known baseline CKD. She   is  on an ACEI/ARB at present.     F/U in 4 months    Signed electronically by: Mack Guise, MD  [facility]  [facility] [address] [place], [place] [address] Phone: [phone] FAX: [phone]   CC: Inda Coke, [place] [address] Phone: [phone]  Fax: [phone]  Return to Endocrinology clinic as below: Future Appointments  Date Time Provider [place]  09/11/2020  9:30 AM Keyana Guevara, Melanie Crazier, MD [facility] None  09/11/2020  4:00 PM [facility] MM 2 [facility]  11/14/2020 10:45 AM Evelina Bucy, DPM [facility]  01/17/2021  9:30 AM Ward Givens, NP [facility] None

## 2020-09-11 NOTE — Patient Instructions (Signed)
-   Increase Rybelsus to 14 mg daily  With Breakfast  - Continue Tresiba 10 units daily     HOW TO TREAT LOW BLOOD SUGARS (Blood sugar LESS THAN 70 MG/DL)  Please follow the RULE OF 15 for the treatment of hypoglycemia treatment (when your (blood sugars are less than 70 mg/dL)    STEP 1: Take 15 grams of carbohydrates when your blood sugar is low, which includes:   3-4 GLUCOSE TABS  OR  3-4 OZ OF JUICE OR REGULAR SODA OR  ONE TUBE OF GLUCOSE GEL     STEP 2: RECHECK blood sugar in 15 MINUTES STEP 3: If your blood sugar is still low at the 15 minute recheck --> then, go back to STEP 1 and treat AGAIN with another 15 grams of carbohydrates.

## 2020-09-15 ENCOUNTER — Other Ambulatory Visit: Payer: Self-pay

## 2020-09-15 ENCOUNTER — Ambulatory Visit (INDEPENDENT_AMBULATORY_CARE_PROVIDER_SITE_OTHER): Payer: Medicare Other

## 2020-09-15 VITALS — BP 148/70 | HR 62 | Temp 97.8°F | Resp 20 | Wt 158.0 lb

## 2020-09-15 DIAGNOSIS — Z0000 Encounter for general adult medical examination without abnormal findings: Secondary | ICD-10-CM

## 2020-09-15 DIAGNOSIS — Z1211 Encounter for screening for malignant neoplasm of colon: Secondary | ICD-10-CM | POA: Diagnosis not present

## 2020-09-15 NOTE — Progress Notes (Addendum)
Subjective:   Catherine Mcconnell is a 74 y.o. female who presents for Medicare Annual (Subsequent) preventive examination.  Review of Systems     Cardiac Risk Factors include: advanced age (>37men, >12 women);diabetes mellitus;hypertension     Objective:    Today's Vitals   09/15/20 1216  BP: (!) 148/70  Pulse: 62  Resp: 20  Temp: 97.8 F (36.6 C)  SpO2: 98%  Weight: 158 lb (71.7 kg)   Body mass index is 25.5 kg/m.  Advanced Directives 09/15/2020 07/05/2020 08/09/2019  Does Patient Have a Medical Advance Directive? Yes No No  Type of Paramedic of [place];Living will - -  Copy of [place] in Chart? No - copy requested - -  Would patient like information on creating a medical advance directive? - No - Patient declined No - Patient declined    Current Medications (verified) Outpatient Encounter Medications as of 09/15/2020  Medication Sig  . acetaminophen-codeine (TYLENOL #3) 300-30 MG tablet Take 2 tablets by mouth every 8 (eight) hours as needed for severe pain.  Marland Kitchen amLODipine (NORVASC) 10 MG tablet TAKE 1 TABLET BY MOUTH  DAILY  . aspirin EC 81 MG tablet Take 81 mg by mouth daily.  Marland Kitchen atorvastatin (LIPITOR) 40 MG tablet TAKE 1 TABLET BY MOUTH  DAILY  . carvedilol (COREG) 6.25 MG tablet TAKE 1 TABLET BY MOUTH  TWICE DAILY WITH MEALS  . ferrous sulfate (IRON SUPPLEMENT) 325 (65 FE) MG tablet Take 1 tablet (325 mg total) by mouth 2 (two) times daily with a meal. (Patient taking differently: Take 325 mg by mouth daily. )  . furosemide (LASIX) 20 MG tablet Take 1 tablet (20 mg total) by mouth daily as needed for edema.  . gabapentin (NEURONTIN) 100 MG capsule TAKE 3 CAPSULES BY MOUTH  DAILY  . insulin degludec (TRESIBA FLEXTOUCH) 100 UNIT/ML FlexTouch Pen Inject 0.1 mLs (10 Units total) into the skin daily.  . Insulin Pen Needle 32G X 4 MM MISC 1 each by Does not apply route as needed.  Marland Kitchen ketoconazole (NIZORAL) 2 % cream Apply 1  fingertip amount to each foot daily.  . Lancets (ONETOUCH ULTRASOFT) lancets CHECK TWICE PER DAY BEFORE  BREAKFAST AND DINNER  . losartan (COZAAR) 50 MG tablet Take 50 mg by mouth daily.  Marland Kitchen LUMIGAN 0.01 % SOLN Place 1 drop into both eyes every evening.  Glory Rosebush ULTRA test strip USE TO TEST BLOOD SUGARS  TWICE DAILY  . Semaglutide (RYBELSUS) 14 MG TABS Take 14 mg by mouth daily.  . [DISCONTINUED] AMBULATORY NON FORMULARY MEDICATION AFO Disp 1 right leg.  New foot drop M21.371 (Patient not taking: Reported on 09/15/2020)   No facility-administered encounter medications on file as of 09/15/2020.    Allergies (verified) Lisinopril and Metformin and related   History: Past Medical History:  Diagnosis Date  . Arthritis   . Chronic kidney disease   . Diabetes mellitus without complication ([place])   . Hypertension   . Iron deficiency    on oral iron supplements, has not required transfusion  . Mixed hyperlipidemia   . OSA on CPAP    compliant  . Thyroid nodule    removed   Past Surgical History:  Procedure Laterality Date  . ABDOMINAL HYSTERECTOMY  1996  . MASS EXCISION Right 2017   right arm mass removal  . THYROID SURGERY     left side removal benign nodule   Family History  Problem Relation Age of Onset  .  Stroke Mother   . Hypertension Mother   . Heart disease Mother   . Heart attack Father   . Early death Father   . Hypertension Father   . Diabetes Sister   . Early death Sister   . Hypertension Sister   . Stroke Sister   . Prostate cancer Son   . Cancer Son   . Early death Son   . Breast cancer Cousin   . Diabetes Sister   . Early death Sister   . Hyperlipidemia Sister   . Kidney disease Sister   . Depression Daughter   . Hypertension Daughter   . Hyperlipidemia Daughter   . Diabetes Sister   . Hyperlipidemia Sister   . Hypertension Sister   . Early death Brother   . Arthritis Brother   . Heart attack Brother   . Heart disease Brother   . Arthritis  Brother   . Diabetes Sister    Social History   Socioeconomic History  . Marital status: Divorced    Spouse name: Not on file  . Number of children: 6  . Years of education: some college  . Highest education level: Not on file  Occupational History  . Occupation: Retired   Tobacco Use  . Smoking status: Former Research scientist (life sciences)  . Smokeless tobacco: Never Used  . Tobacco comment: only socially in high school  Vaping Use  . Vaping Use: Never used  Substance and Sexual Activity  . Alcohol use: Not Currently  . Drug use: Never  . Sexual activity: Not on file  Other Topics Concern  . Not on file  Social History Narrative   03/16/20 lives with dgtr Weber Cooks   Mother of 6   --Two children have passed, one from from prostate cancer and one from liver failure   Used to be a CNA, Child psychotherapist   Recently moved back to [facility]   Financial Resource Strain: Low Risk   . Difficulty of Paying Living Expenses: Not hard at all  Food Insecurity: No Food Insecurity  . Worried About Charity fundraiser in the Last Year: Never true  . Ran Out of Food in the Last Year: Never true  Transportation Needs: No Transportation Needs  . Lack of Transportation (Medical): No  . Lack of Transportation (Non-Medical): No  Physical Activity: Insufficiently Active  . Days of Exercise per Week: 5 days  . Minutes of Exercise per Session: 20 min  Stress: No Stress Concern Present  . Feeling of Stress : Not at all  Social Connections: Socially Isolated  . Frequency of Communication with Friends and Family: Never  . Frequency of Social Gatherings with Friends and Family: Never  . Attends Religious Services: More than 4 times per year  . Active Member of Clubs or Organizations: No  . Attends Archivist Meetings: Never  . Marital Status: Divorced    Tobacco Counseling Counseling given: Not Answered Comment: only socially in high school   Clinical Intake:  Pre-visit preparation  completed: Yes  Pain : No/denies pain     BMI - recorded: 25.5 Nutritional Status: BMI 25 -29 Overweight Nutritional Risks: None Diabetes: Yes CBG done?: Yes (266) CBG resulted in Enter/ Edit results?: No Did pt. bring in CBG monitor from home?: No  How often do you need to have someone help you when you read instructions, pamphlets, or other written materials from your doctor or pharmacy?: 1 - Never  Diabetic?Nutrition Risk Assessment:  Has  the patient had any N/V/D within the last 2 months?  No  Does the patient have any non-healing wounds?  No  Has the patient had any unintentional weight loss or weight gain?  No   Diabetes:  Is the patient diabetic?  Yes  If diabetic, was a CBG obtained today?  Yes  Did the patient bring in their glucometer from home?  No  How often do you monitor your CBG's? Daily.   Financial Strains and Diabetes Management:  Are you having any financial strains with the device, your supplies or your medication? No .  Does the patient want to be seen by Chronic Care Management for management of their diabetes?  No  Would the patient like to be referred to a Nutritionist or for Diabetic Management?  No   Diabetic Exams:  Diabetic Eye Exam: Completed 05/30/20 Diabetic Foot Exam: Completed 01/19/20   Interpreter Needed?: No  Information entered by :: Charlott Rakes, LPN   Activities of Daily Living In your present state of health, do you have any difficulty performing the following activities: 09/15/2020  Hearing? N  Vision? N  Difficulty concentrating or making decisions? N  Walking or climbing stairs? N  Dressing or bathing? N  Doing errands, shopping? N  Preparing Food and eating ? N  Using the Toilet? N  In the past six months, have you accidently leaked urine? N  Do you have problems with loss of bowel control? N  Managing your Medications? N  Managing your Finances? N  Housekeeping or managing your Housekeeping? N  Some recent data  might be hidden    Patient Care Team: Inda Coke, [place] as PCP - General (Physician Assistant) Nigel Mormon, MD as Consulting Physician (Cardiology) [name], MD as Consulting Physician (Nephrology) Minette Brine, FNP (General Practice) Dohmeier, Asencion Partridge, MD as Consulting Physician (Neurology) [facility] as Consulting Physician  Indicate any recent Medical Services you may have received from other than [facility] providers in the past year (date may be approximate).     Assessment:   This is a routine wellness examination for [place].  Hearing/Vision screen  Hearing Screening   125Hz  250Hz  500Hz  1000Hz  2000Hz  3000Hz  4000Hz  6000Hz  8000Hz   Right ear:           Left ear:           Comments: Pt denies any hearing at this time  Vision Screening Comments: Pt follows up with Dr Katy Fitch for annual eye exams  Dietary issues and exercise activities discussed: Current Exercise Habits: Home exercise routine, Type of exercise: walking (walking dog), Time (Minutes): 15, Frequency (Times/Week): 5, Weekly Exercise (Minutes/Week): 75  Goals    . Patient Stated     Exercise more       Depression Screen PHQ 2/9 Scores 09/15/2020 08/09/2019 08/13/2018  PHQ - 2 Score 0 0 0    Fall Risk Fall Risk  09/15/2020 03/16/2020 08/09/2019 08/13/2018  Falls in the past year? 1 0 0 No  Number falls in past yr: 1 - - -  Injury with Fall? 1 - 0 -  Comment scrape on left knee - - -  Risk for fall due to : Impaired vision - - -  Follow up Falls prevention discussed - Falls evaluation completed;Education provided;Falls prevention discussed -    Any stairs in or around the home? Yes  If so, are there any without handrails? No  Home free of loose throw rugs in walkways, pet beds, electrical cords,  etc? Yes  Adequate lighting in your home to reduce risk of falls? Yes   ASSISTIVE DEVICES UTILIZED TO PREVENT FALLS:  Life alert? No  Use of a cane, walker or w/c? No  Grab bars  in the bathroom? Yes  Shower chair or bench in shower? No  Elevated toilet seat or a handicapped toilet? Yes   TIMED UP AND GO:  Was the test performed? Yes .  Length of time to ambulate 10 feet: 10 sec.   Gait slow and steady without use of assistive device  Cognitive Function: MMSE - Mini Mental State Exam 02/11/2020 02/11/2020  Orientation to time 5 5  Orientation to Place 5 5  Attention/ Calculation 5 5  Recall - 1  Language- follow 3 step command 3 -  Write a sentence 1 1   Montreal Cognitive Assessment  07/17/2020 03/16/2020  Visuospatial/ Executive (0/5) 5 4  Naming (0/3) 3 3  Attention: Read list of digits (0/2) 2 1  Attention: Read list of letters (0/1) 1 1  Attention: Serial 7 subtraction starting at 100 (0/3) 2 3  Language: Repeat phrase (0/2) 2 2  Language : Fluency (0/1) 0 1  Abstraction (0/2) 2 2  Delayed Recall (0/5) 2 1  Orientation (0/6) 6 6  Total 25 24  Adjusted Score (based on education) 25 -   6CIT Screen 09/15/2020 08/09/2019  What Year? 0 points 0 points  What month? 0 points 0 points  What time? - 0 points  Count back from 20 0 points 0 points  Months in reverse 0 points 0 points  Repeat phrase 2 points 0 points  Total Score - 0    Immunizations Immunization History  Administered Date(s) Administered  . Fluad Quad(high Dose 65+) 07/02/2019, 07/24/2020  . Influenza, High Dose Seasonal PF 07/14/2018  . PFIZER SARS-COV-2 Vaccination 01/20/2020, 02/14/2020  . Pneumococcal Conjugate-13 07/24/2020  . Pneumococcal Polysaccharide-23 11/13/2018  . Tdap 08/05/2018  . Zoster Recombinat (Shingrix) 06/05/2018, 08/05/2018    TDAP status: Up to date Flu Vaccine status: Up to date Pneumococcal vaccine status: Up to date Covid-19 vaccine status: Completed vaccines  Qualifies for Shingles Vaccine? Yes   Zostavax completed Yes   Shingrix Completed?: Yes  Screening Tests Health Maintenance  Topic Date Due  . COLONOSCOPY  Never done  . OPHTHALMOLOGY  EXAM  09/05/2020  . FOOT EXAM  01/18/2021  . HEMOGLOBIN A1C  03/11/2021  . MAMMOGRAM  09/11/2022  . TETANUS/TDAP  08/05/2028  . INFLUENZA VACCINE  Completed  . DEXA SCAN  Completed  . COVID-19 Vaccine  Completed  . Hepatitis C Screening  Completed  . PNA vac Low Risk Adult  Completed    Health Maintenance  Health Maintenance Due  Topic Date Due  . COLONOSCOPY  Never done  . OPHTHALMOLOGY EXAM  09/05/2020    Colonoscopy order placed 09/15/20 Mammogram status: Completed 09/11/20. Repeat every year Bone Density status: Completed 08/12/18. Results reflect: Bone density results: NORMAL. Repeat every 2 years.  Additional Screening:  Hepatitis C Screening:  Completed 01/19/20  Vision Screening: Recommended annual ophthalmology exams for early detection of glaucoma and other disorders of the eye. Is the patient up to date with their annual eye exam?  Yes  Who is the provider or what is the name of the office in which the patient attends annual eye exams? Dr Katy Fitch   Dental Screening: Recommended annual dental exams for proper oral hygiene  Community Resource Referral / Chronic Care Management: CRR required this visit?  No   CCM required this visit?  No      Plan:     I have personally reviewed and noted the following in the patient's chart:   . Medical and social history . Use of alcohol, tobacco or illicit drugs  . Current medications and supplements . Functional ability and status . Nutritional status . Physical activity . Advanced directives . List of other physicians . Hospitalizations, surgeries, and ER visits in previous 12 months . Vitals . Screenings to include cognitive, depression, and falls . Referrals and appointments  In addition, I have reviewed and discussed with patient certain preventive protocols, quality metrics, and best practice recommendations. A written personalized care plan for preventive services as well as general preventive health  recommendations were provided to patient.     Willette Brace, LPN   [id number]   Nurse Notes: None  I have reviewed documentation for AWV and Advance Care planning provided by [facility], I agree with documentation, I was immediately available for any questions. Inda Coke, [place]

## 2020-09-15 NOTE — Patient Instructions (Addendum)
Catherine Mcconnell , Thank you for taking time to come for your Medicare Wellness Visit. I appreciate your ongoing commitment to your health goals. Please review the following plan we discussed and let me know if I can assist you in the future.   Screening recommendations/referrals: Colonoscopy: Ordered placed today 09/15/20 Mammogram: Done 09/11/20 Bone Density: Done 08/12/18 Recommended yearly ophthalmology/optometry visit for glaucoma screening and checkup Recommended yearly dental visit for hygiene and checkup  Vaccinations: Influenza vaccine: Done 07/24/20 Up to date Pneumococcal vaccine: Up to date Tdap vaccine: Up to date Shingles vaccine: Completed 8/23 & 08/05/18 Covid-19:Completed 4/8 & 02/14/20  Advanced directives: Please bring a copy of your health care power of attorney and living will to the office at your convenience.  Conditions/risks identified: Exercise more  Next appointment: Follow up in one year for your annual wellness visit    Preventive Care 65 Years and Older, Female Preventive care refers to lifestyle choices and visits with your health care provider that can promote health and wellness. What does preventive care include?  A yearly physical exam. This is also called an annual well check.  Dental exams once or twice a year.  Routine eye exams. Ask your health care provider how often you should have your eyes checked.  Personal lifestyle choices, including:  Daily care of your teeth and gums.  Regular physical activity.  Eating a healthy diet.  Avoiding tobacco and drug use.  Limiting alcohol use.  Practicing safe sex.  Taking low-dose aspirin every day.  Taking vitamin and mineral supplements as recommended by your health care provider. What happens during an annual well check? The services and screenings done by your health care provider during your annual well check will depend on your age, overall health, lifestyle risk factors, and family history  of disease. Counseling  Your health care provider may ask you questions about your:  Alcohol use.  Tobacco use.  Drug use.  Emotional well-being.  Home and relationship well-being.  Sexual activity.  Eating habits.  History of falls.  Memory and ability to understand (cognition).  Work and work Statistician.  Reproductive health. Screening  You may have the following tests or measurements:  Height, weight, and BMI.  Blood pressure.  Lipid and cholesterol levels. These may be checked every 5 years, or more frequently if you are over 27 years old.  Skin check.  Lung cancer screening. You may have this screening every year starting at age 65 if you have a 30-pack-year history of smoking and currently smoke or have quit within the past 15 years.  Fecal occult blood test (FOBT) of the stool. You may have this test every year starting at age 17.  Flexible sigmoidoscopy or colonoscopy. You may have a sigmoidoscopy every 5 years or a colonoscopy every 10 years starting at age 9.  Hepatitis C blood test.  Hepatitis B blood test.  Sexually transmitted disease (STD) testing.  Diabetes screening. This is done by checking your blood sugar (glucose) after you have not eaten for a while (fasting). You may have this done every 1-3 years.  Bone density scan. This is done to screen for osteoporosis. You may have this done starting at age 1.  Mammogram. This may be done every 1-2 years. Talk to your health care provider about how often you should have regular mammograms. Talk with your health care provider about your test results, treatment options, and if necessary, the need for more tests. Vaccines  Your health care provider may  recommend certain vaccines, such as:  Influenza vaccine. This is recommended every year.  Tetanus, diphtheria, and acellular pertussis (Tdap, Td) vaccine. You may need a Td booster every 10 years.  Zoster vaccine. You may need this after age  6.  Pneumococcal 13-valent conjugate (PCV13) vaccine. One dose is recommended after age 52.  Pneumococcal polysaccharide (PPSV23) vaccine. One dose is recommended after age 79. Talk to your health care provider about which screenings and vaccines you need and how often you need them. This information is not intended to replace advice given to you by your health care provider. Make sure you discuss any questions you have with your health care provider. Document Released: 10/27/2015 Document Revised: 06/19/2016 Document Reviewed: 08/01/2015 Elsevier Interactive Patient Education  2017 [place] Prevention in the Home Falls can cause injuries. They can happen to people of all ages. There are many things you can do to make your home safe and to help prevent falls. What can I do on the outside of my home?  Regularly fix the edges of walkways and driveways and fix any cracks.  Remove anything that might make you trip as you walk through a door, such as a raised step or threshold.  Trim any bushes or trees on the path to your home.  Use bright outdoor lighting.  Clear any walking paths of anything that might make someone trip, such as rocks or tools.  Regularly check to see if handrails are loose or broken. Make sure that both sides of any steps have handrails.  Any raised decks and porches should have guardrails on the edges.  Have any leaves, snow, or ice cleared regularly.  Use sand or salt on walking paths during winter.  Clean up any spills in your garage right away. This includes oil or grease spills. What can I do in the bathroom?  Use night lights.  Install grab bars by the toilet and in the tub and shower. Do not use towel bars as grab bars.  Use non-skid mats or decals in the tub or shower.  If you need to sit down in the shower, use a plastic, non-slip stool.  Keep the floor dry. Clean up any water that spills on the floor as soon as it happens.  Remove  soap buildup in the tub or shower regularly.  Attach bath mats securely with double-sided non-slip rug tape.  Do not have throw rugs and other things on the floor that can make you trip. What can I do in the bedroom?  Use night lights.  Make sure that you have a light by your bed that is easy to reach.  Do not use any sheets or blankets that are too big for your bed. They should not hang down onto the floor.  Have a firm chair that has side arms. You can use this for support while you get dressed.  Do not have throw rugs and other things on the floor that can make you trip. What can I do in the kitchen?  Clean up any spills right away.  Avoid walking on wet floors.  Keep items that you use a lot in easy-to-reach places.  If you need to reach something above you, use a strong step stool that has a grab bar.  Keep electrical cords out of the way.  Do not use floor polish or wax that makes floors slippery. If you must use wax, use non-skid floor wax.  Do not have throw rugs and  other things on the floor that can make you trip. What can I do with my stairs?  Do not leave any items on the stairs.  Make sure that there are handrails on both sides of the stairs and use them. Fix handrails that are broken or loose. Make sure that handrails are as long as the stairways.  Check any carpeting to make sure that it is firmly attached to the stairs. Fix any carpet that is loose or worn.  Avoid having throw rugs at the top or bottom of the stairs. If you do have throw rugs, attach them to the floor with carpet tape.  Make sure that you have a light switch at the top of the stairs and the bottom of the stairs. If you do not have them, ask someone to add them for you. What else can I do to help prevent falls?  Wear shoes that:  Do not have high heels.  Have rubber bottoms.  Are comfortable and fit you well.  Are closed at the toe. Do not wear sandals.  If you use a  stepladder:  Make sure that it is fully opened. Do not climb a closed stepladder.  Make sure that both sides of the stepladder are locked into place.  Ask someone to hold it for you, if possible.  Clearly mark and make sure that you can see:  Any grab bars or handrails.  First and last steps.  Where the edge of each step is.  Use tools that help you move around (mobility aids) if they are needed. These include:  Canes.  Walkers.  Scooters.  Crutches.  Turn on the lights when you go into a dark area. Replace any light bulbs as soon as they burn out.  Set up your furniture so you have a clear path. Avoid moving your furniture around.  If any of your floors are uneven, fix them.  If there are any pets around you, be aware of where they are.  Review your medicines with your doctor. Some medicines can make you feel dizzy. This can increase your chance of falling. Ask your doctor what other things that you can do to help prevent falls. This information is not intended to replace advice given to you by your health care provider. Make sure you discuss any questions you have with your health care provider. Document Released: 07/27/2009 Document Revised: 03/07/2016 Document Reviewed: 11/04/2014 Elsevier Interactive Patient Education  2017 [employer].

## 2020-09-21 ENCOUNTER — Other Ambulatory Visit: Payer: Self-pay | Admitting: Physician Assistant

## 2020-09-21 ENCOUNTER — Telehealth: Payer: Self-pay

## 2020-09-21 MED ORDER — CARVEDILOL 6.25 MG PO TABS
6.2500 mg | ORAL_TABLET | Freq: Two times a day (BID) | ORAL | 0 refills | Status: DC
Start: 2020-09-21 — End: 2020-09-21

## 2020-09-21 NOTE — Telephone Encounter (Signed)
Pt ran out of medicine and JUST told daughter. Daughter is requesting just an " emergency" prescription be sent in. She is hoping to get it today. Please call pt once medication is called in. Told her [name] was out today, but that maybe Dr. Jerline Pain could help, but am unsure.     LAST APPOINTMENT DATE: 09/15/2020   NEXT APPOINTMENT DATE:@Visit  date not found  MEDICATION:carvedilol (COREG) 6.25 MG tablet    PHARMACY: [id number] - [place], [place] - [address]

## 2020-09-21 NOTE — Telephone Encounter (Signed)
Spoke to pt's daughter told her I sent Rx to [facility] that I received a request from. Catherine Mcconnell said okay but needs sent to [facility] due to pt is out. Told her okay will send 30 day supply now. Catherine Mcconnell verbalized understanding. Rx sent.

## 2020-11-07 ENCOUNTER — Other Ambulatory Visit: Payer: Medicare Other

## 2020-11-07 ENCOUNTER — Ambulatory Visit: Payer: Medicare Other | Attending: Internal Medicine

## 2020-11-07 DIAGNOSIS — Z23 Encounter for immunization: Secondary | ICD-10-CM

## 2020-11-07 NOTE — Progress Notes (Signed)
   Covid-19 Vaccination Clinic  Name:  Catherine Mcconnell    MRN: [id number] DOB: 11/19/1945  11/07/2020  Catherine Mcconnell was observed post Covid-19 immunization for 15 minutes without incident. She was provided with Vaccine Information Sheet and instruction to access the V-Safe system.   Catherine Mcconnell was instructed to call 911 with any severe reactions post vaccine: Marland Kitchen Difficulty breathing  . Swelling of face and throat  . A fast heartbeat  . A bad rash all over body  . Dizziness and weakness   Immunizations Administered    Name Date Dose VIS Date Route   PFIZER Comrnaty(Gray TOP) Covid-19 Vaccine 11/07/2020  1:42 PM 0.3 mL 09/21/2020 Intramuscular   Manufacturer: [place]   Lot: [id number]   NDC: [phone]

## 2020-11-14 ENCOUNTER — Ambulatory Visit (INDEPENDENT_AMBULATORY_CARE_PROVIDER_SITE_OTHER): Payer: Medicare Other | Admitting: Podiatry

## 2020-11-14 ENCOUNTER — Other Ambulatory Visit: Payer: Self-pay

## 2020-11-14 DIAGNOSIS — B351 Tinea unguium: Secondary | ICD-10-CM

## 2020-11-14 DIAGNOSIS — E1142 Type 2 diabetes mellitus with diabetic polyneuropathy: Secondary | ICD-10-CM | POA: Diagnosis not present

## 2020-11-14 DIAGNOSIS — E1169 Type 2 diabetes mellitus with other specified complication: Secondary | ICD-10-CM | POA: Diagnosis not present

## 2020-11-14 NOTE — Progress Notes (Signed)
  Subjective:  Patient ID: Catherine Mcconnell, female    DOB: 1946-10-02,  MRN: [id number]  Chief Complaint  Patient presents with  . debriement     A1C:8  PCP: Morene Rankins x Oct FBS: 46    75 y.o. female presents with the above complaint. History confirmed with patient.  Denies new complaints with the feet  Objective:  Physical Exam: warm, good capillary refill, nail exam normal nails without lesions and onychomycosis of the toenails, no trophic changes or ulcerative lesions. DP pulses palpable and protective sensation intact. Xerosis with scaling plantar foot bilat. Left Foot: normal exam, no swelling, tenderness, instability; ligaments intact, full range of motion of all ankle/foot joints  Right Foot: normal exam, no swelling, tenderness, instability; ligaments intact, full range of motion of all ankle/foot joints   No images are attached to the encounter.  Assessment:   1. Onychomycosis of multiple toenails with type 2 diabetes mellitus and peripheral neuropathy ([place])    Plan:  Patient was evaluated and treated and all questions answered.  Onychomycosis, Diabetes and PAD -Nails debrided as below   Procedure: Nail Debridement Type of Debridement: manual, sharp debridement. Instrumentation: Nail nipper, rotary burr. Number of Nails: 10      No follow-ups on file.

## 2020-11-20 ENCOUNTER — Other Ambulatory Visit: Payer: Self-pay | Admitting: Physician Assistant

## 2020-11-28 LAB — HM DIABETES EYE EXAM

## 2020-12-01 ENCOUNTER — Ambulatory Visit (AMBULATORY_SURGERY_CENTER): Payer: Self-pay

## 2020-12-01 ENCOUNTER — Other Ambulatory Visit: Payer: Self-pay

## 2020-12-01 VITALS — Ht 66.0 in | Wt 150.0 lb

## 2020-12-01 DIAGNOSIS — Z1211 Encounter for screening for malignant neoplasm of colon: Secondary | ICD-10-CM

## 2020-12-01 MED ORDER — NA SULFATE-K SULFATE-MG SULF 17.5-3.13-1.6 GM/177ML PO SOLN: 1.0000 | Freq: Once | ORAL | 0 refills | Status: AC

## 2020-12-01 NOTE — Progress Notes (Signed)
No egg or soy allergy known to patient  No issues with past sedation with any surgeries or procedures No intubation problems in the past  No FH of Malignant Hyperthermia No diet pills per patient No home 02 use per patient  No blood thinners per patient  Pt denies issues with constipation  No A fib or A flutter  EMMI video via MyChart  COVID 19 guidelines implemented in PV today with Pt and RN  Pt is fully vaccinated for Covid x 2 + booster= Pt denies loose or missing teeth; Patient denies partials, dental implants, capped or bonded teeth; Patient reports upper and lower dentures; Coupon given to pt in PV today, Code to Pharmacy and  NO PA's for preps discussed with pt in PV today  Discussed with pt there will be an out-of-pocket cost for prep and that varies from $0 to 70 dollars  Due to the COVID-19 pandemic we are asking patients to follow certain guidelines.  Pt aware of COVID protocols and [facility] guidelines

## 2020-12-05 ENCOUNTER — Encounter: Payer: Self-pay | Admitting: Physician Assistant

## 2020-12-07 ENCOUNTER — Encounter: Payer: Self-pay | Admitting: Gastroenterology

## 2020-12-15 ENCOUNTER — Ambulatory Visit (AMBULATORY_SURGERY_CENTER): Payer: Medicare Other | Admitting: Gastroenterology

## 2020-12-15 ENCOUNTER — Other Ambulatory Visit: Payer: Self-pay

## 2020-12-15 ENCOUNTER — Encounter: Payer: Self-pay | Admitting: Gastroenterology

## 2020-12-15 VITALS — BP 106/63 | HR 64 | Temp 96.2°F | Resp 17 | Ht 66.0 in | Wt 150.0 lb

## 2020-12-15 DIAGNOSIS — Z1211 Encounter for screening for malignant neoplasm of colon: Secondary | ICD-10-CM | POA: Diagnosis not present

## 2020-12-15 MED ORDER — SODIUM CHLORIDE 0.9 % IV SOLN: 500.0000 mL | Freq: Once | INTRAVENOUS | Status: DC

## 2020-12-15 NOTE — Op Note (Signed)
[place] Patient Name: Catherine Mcconnell Procedure Date: 12/15/2020 10:07 AM MRN: [id number] Endoscopist: Mauri Pole , MD Age: 75 Referring MD:  Date of Birth: Aug 28, 1946 Gender: Female Account #: [id number] Procedure:                Colonoscopy Indications:              Screening for colorectal malignant neoplasm Medicines:                Monitored Anesthesia Care Procedure:                Pre-Anesthesia Assessment:                           - Prior to the procedure, a History and Physical                            was performed, and patient medications and                            allergies were reviewed. The patient's tolerance of                            previous anesthesia was also reviewed. The risks                            and benefits of the procedure and the sedation                            options and risks were discussed with the patient.                            All questions were answered, and informed consent                            was obtained. Prior Anticoagulants: The patient has                            taken no previous anticoagulant or antiplatelet                            agents. ASA Grade Assessment: II - A patient with                            mild systemic disease. After reviewing the risks                            and benefits, the patient was deemed in                            satisfactory condition to undergo the procedure.                           After obtaining informed consent, the colonoscope  was passed under direct vision. Throughout the                            procedure, the patient's blood pressure, pulse, and                            oxygen saturations were monitored continuously. The                            Olympus PCF-H190DL (#[id number]) Colonoscope was                            introduced through the anus and advanced to the the                            cecum,  identified by appendiceal orifice and                            ileocecal valve. The colonoscopy was performed                            without difficulty. The patient tolerated the                            procedure well. The quality of the bowel                            preparation was excellent. The ileocecal valve,                            appendiceal orifice, and rectum were photographed. Scope In: 10:28:30 AM Scope Out: 10:44:22 AM Scope Withdrawal Time: 0 hours 10 minutes 7 seconds  Total Procedure Duration: 0 hours 15 minutes 52 seconds  Findings:                 The perianal and digital rectal examinations were                            normal.                           Non-bleeding external and internal hemorrhoids were                            found during retroflexion. The hemorrhoids were                            small.                           The exam was otherwise without abnormality. Complications:            No immediate complications. Estimated Blood Loss:     Estimated blood loss was minimal. Impression:               - Non-bleeding external and internal hemorrhoids.                           -  The examination was otherwise normal.                           - No specimens collected. Recommendation:           - Patient has a contact number available for                            emergencies. The signs and symptoms of potential                            delayed complications were discussed with the                            patient. Return to normal activities tomorrow.                            Written discharge instructions were provided to the                            patient.                           - Resume previous diet.                           - Continue present medications.                           - No repeat colonoscopy due to age. Mauri Pole, MD 12/15/2020 10:47:35 AM This report has been signed electronically.

## 2020-12-15 NOTE — Progress Notes (Signed)
PT taken to PACU. Monitors in place. VSS. Report given to RN. 

## 2020-12-15 NOTE — Progress Notes (Signed)
Medical history reviewed with no changes noted. VS assessed by C.W 

## 2020-12-15 NOTE — Patient Instructions (Signed)
Please, read all of the handouts given to you by your recovery room nurse.  YOU HAD AN ENDOSCOPIC PROCEDURE TODAY AT [facility]:   Refer to the procedure report that was given to you for any specific questions about what was found during the examination.  If the procedure report does not answer your questions, please call your gastroenterologist to clarify.  If you requested that your care partner not be given the details of your procedure findings, then the procedure report has been included in a sealed envelope for you to review at your convenience later.  YOU SHOULD EXPECT: Some feelings of bloating in the abdomen. Passage of more gas than usual.  Walking can help get rid of the air that was put into your GI tract during the procedure and reduce the bloating. If you had a lower endoscopy (such as a colonoscopy or flexible sigmoidoscopy) you may notice spotting of blood in your stool or on the toilet paper. If you underwent a bowel prep for your procedure, you may not have a normal bowel movement for a few days.  Please Note:  You might notice some irritation and congestion in your nose or some drainage.  This is from the oxygen used during your procedure.  There is no need for concern and it should clear up in a day or so.  SYMPTOMS TO REPORT IMMEDIATELY:   Following lower endoscopy (colonoscopy or flexible sigmoidoscopy):  Excessive amounts of blood in the stool  Significant tenderness or worsening of abdominal pains  Swelling of the abdomen that is new, acute  Fever of 100F or higher   For urgent or emergent issues, a gastroenterologist can be reached at any hour by calling [phone]. Do not use MyChart messaging for urgent concerns.    DIET:  We do recommend a small meal at first, but then you may proceed to your regular diet.  Drink plenty of fluids but you should avoid alcoholic beverages for 24 hours.  ACTIVITY:  You should plan to take it easy for the rest of  today and you should NOT DRIVE or use heavy machinery until tomorrow (because of the sedation medicines used during the test).    FOLLOW UP: Our staff will call the number listed on your records 48-72 hours following your procedure to check on you and address any questions or concerns that you may have regarding the information given to you following your procedure. If we do not reach you, we will leave a message.  We will attempt to reach you two times.  During this call, we will ask if you have developed any symptoms of COVID 19. If you develop any symptoms (ie: fever, flu-like symptoms, shortness of breath, cough etc.) before then, please call [phone].  If you test positive for Covid 19 in the 2 weeks post procedure, please call and report this information to [place].     SIGNATURES/CONFIDENTIALITY: You and/or your care partner have signed paperwork which will be entered into your electronic medical record.  These signatures attest to the fact that that the information above on your After Visit Summary has been reviewed and is understood.  Full responsibility of the confidentiality of this discharge information lies with you and/or your care-partner.

## 2020-12-19 ENCOUNTER — Encounter: Payer: Self-pay | Admitting: Physician Assistant

## 2020-12-19 ENCOUNTER — Ambulatory Visit (INDEPENDENT_AMBULATORY_CARE_PROVIDER_SITE_OTHER): Payer: Medicare Other | Admitting: Physician Assistant

## 2020-12-19 ENCOUNTER — Other Ambulatory Visit: Payer: Self-pay

## 2020-12-19 ENCOUNTER — Telehealth: Payer: Self-pay | Admitting: *Deleted

## 2020-12-19 VITALS — BP 146/78 | HR 70 | Temp 98.0°F | Wt 148.6 lb

## 2020-12-19 DIAGNOSIS — R413 Other amnesia: Secondary | ICD-10-CM | POA: Diagnosis not present

## 2020-12-19 NOTE — Patient Instructions (Signed)
It was great to see you!  Let's get you back to Dr. Maureen Chatters or her NP Jinny Blossom to follow-up on your memory changes.  Take care,  Inda Coke PA-C

## 2020-12-19 NOTE — Progress Notes (Signed)
Catherine Mcconnell is a 75 y.o. female here for a follow up of a pre-existing problem.  I acted as a Education administrator for [employer], PA-C Anselmo Pickler, LPN   History of Present Illness:   Chief Complaint  Patient presents with  . Memory issues    HPI   Memory issues Pt is having some forgetfulness like paying bills in the past month. Symptoms have been more worse over the past 2 weeks per her daughter, who is with her today and made this appointment. She is taking her medications regularly but not at the same time every day. She was recently scammed two times via social media. She is not driving regularly because she does not go to any places on a regular basis so she is unable to tell me if she has any issues with her driving/directions.  She had neuro testing with [facility] on 03/16/20 and then repeat testing on 07/17/20. 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  Paternal uncle with Alzheimers, paternal cousin with dementia.  Wt Readings from Last 5 Encounters:  12/19/20 148 lb 9.6 oz (67.4 kg)  12/15/20 150 lb (68 kg)  12/01/20 150 lb (68 kg)  09/15/20 158 lb (71.7 kg)  09/11/20 160 lb 4 oz (72.7 kg)    Past Medical History:  Diagnosis Date  . Anemia    on meds  . Arthritis    hips/wrists/back  . Cataract    sx-bilateral  . Chronic kidney disease    stage 3  . Diabetes mellitus without complication ([place])    on meds  . Hypertension    on meds  . Iron deficiency    on oral iron supplements, has not required transfusion  . Mixed hyperlipidemia    on meds  . OSA on CPAP    compliant  . Sleep apnea    uses CPAP  . Thyroid nodule    removed-not on meds at this time (12/01/2020)     Social History   Tobacco Use  . Smoking status: Former Research scientist (life sciences)  . Smokeless tobacco: Never Used  . Tobacco comment: only socially in high school  Vaping Use  . Vaping Use: Never used  Substance Use Topics  . Alcohol use: Not Currently  . Drug use: Never     Past Surgical History:  Procedure Laterality Date  . ABDOMINAL HYSTERECTOMY  1996  . MASS EXCISION Right 2017   right arm mass removal  . THYROID SURGERY     left side removal benign nodule    Family History  Problem Relation Age of Onset  . Stroke Mother   . Hypertension Mother   . Heart disease Mother   . Heart attack Father   . Early death Father   . Hypertension Father   . Diabetes Sister   . Early death Sister   . Hypertension Sister   . Stroke Sister   . Prostate cancer Son   . Cancer Son   . Early death Son   . Breast cancer Cousin   . Diabetes Sister   . Early death Sister   . Hyperlipidemia Sister   . Kidney disease Sister   . Depression Daughter   . Hypertension Daughter   . Hyperlipidemia Daughter   . Diabetes Sister   . Hyperlipidemia Sister   . Hypertension Sister   . Early death Brother   . Arthritis Brother   . Heart attack Brother   . Heart disease Brother   .  Arthritis Brother   . Diabetes Sister   . Colon polyps Neg Hx   . Colon cancer Neg Hx   . Esophageal cancer Neg Hx   . Rectal cancer Neg Hx   . Stomach cancer Neg Hx     Allergies  Allergen Reactions  . Lisinopril Rash  . Metformin And Related Swelling    Current Medications:   Current Outpatient Medications:  .  acetaminophen-codeine (TYLENOL #3) 300-30 MG tablet, Take 2 tablets by mouth every 8 (eight) hours as needed for severe pain., Disp: 30 tablet, Rfl: 0 .  amLODipine (NORVASC) 10 MG tablet, TAKE 1 TABLET BY MOUTH  DAILY, Disp: 90 tablet, Rfl: 3 .  aspirin EC 81 MG tablet, Take 81 mg by mouth daily., Disp: , Rfl:  .  atorvastatin (LIPITOR) 40 MG tablet, TAKE 1 TABLET BY MOUTH  DAILY, Disp: 90 tablet, Rfl: 3 .  carvedilol (COREG) 6.25 MG tablet, TAKE 1 TABLET(6.25 MG) BY MOUTH TWICE DAILY WITH A MEAL, Disp: 180 tablet, Rfl: 0 .  ferrous sulfate (IRON SUPPLEMENT) 325 (65 FE) MG tablet, Take 1 tablet (325 mg total) by mouth 2 (two) times daily with a meal., Disp: 60 tablet,  Rfl: 2 .  furosemide (LASIX) 20 MG tablet, Take 1 tablet (20 mg total) by mouth daily as needed for edema., Disp: 30 tablet, Rfl: 1 .  gabapentin (NEURONTIN) 100 MG capsule, TAKE 3 CAPSULES BY MOUTH  DAILY (Patient taking differently: Take 100 mg by mouth daily in the afternoon. TAKE 3 CAPSULES BY MOUTH DAILY.), Disp: 270 capsule, Rfl: 3 .  insulin degludec (TRESIBA FLEXTOUCH) 100 UNIT/ML FlexTouch Pen, Inject 0.1 mLs (10 Units total) into the skin daily., Disp: 15 mL, Rfl: 3 .  Insulin Pen Needle 32G X 4 MM MISC, 1 each by Does not apply route as needed., Disp: 100 each, Rfl: 3 .  ketoconazole (NIZORAL) 2 % cream, Apply 1 fingertip amount to each foot daily. (Patient taking differently: Apply 1 application topically daily as needed. Apply 1 fingertip amount to each foot daily.), Disp: 30 g, Rfl: 0 .  Lancets (ONETOUCH ULTRASOFT) lancets, CHECK TWICE PER DAY BEFORE  BREAKFAST AND DINNER, Disp: 200 each, Rfl: 4 .  losartan (COZAAR) 50 MG tablet, Take 50 mg by mouth daily., Disp: , Rfl:  .  LUMIGAN 0.01 % SOLN, Place 1 drop into both eyes every evening., Disp: , Rfl:  .  ONETOUCH ULTRA test strip, USE TO TEST BLOOD SUGARS  TWICE DAILY, Disp: 200 strip, Rfl: 3 .  Semaglutide (RYBELSUS) 14 MG TABS, Take 14 mg by mouth daily., Disp: 90 tablet, Rfl: 2   Review of Systems:   ROS Negative unless otherwise specified per HPI.  Vitals:   Vitals:   12/19/20 1041  BP: (!) 146/78  Pulse: 70  Temp: 98 F (36.7 C)  TempSrc: Temporal  SpO2: 98%  Weight: 148 lb 9.6 oz (67.4 kg)     Body mass index is 23.98 kg/m.  Physical Exam:   Physical Exam Vitals and nursing note reviewed.  Constitutional:      General: She is not in acute distress.    Appearance: She is well-developed. She is not ill-appearing, toxic-appearing or sickly-appearing.  Cardiovascular:     Rate and Rhythm: Normal rate and regular rhythm.     Pulses: Normal pulses.     Heart sounds: Normal heart sounds, S1 normal and S2  normal.     Comments: No LE edema Pulmonary:     Effort: Pulmonary  effort is normal.     Breath sounds: Normal breath sounds.  Skin:    General: Skin is warm, dry and intact.  Neurological:     Mental Status: She is alert.     GCS: GCS eye subscore is 4. GCS verbal subscore is 5. GCS motor subscore is 6.  Psychiatric:        Mood and Affect: Mood and affect normal.        Speech: Speech normal.        Behavior: Behavior normal. Behavior is cooperative.     Assessment and Plan:   Darrin was seen today for memory issues.  Diagnoses and all orders for this visit:  Memory loss   Recommend close follow-up with neurology. Daughter and patient in agreement.  CMA or LPN served as scribe during this visit. History, Physical, and Plan performed by medical provider. The above documentation has been reviewed and is accurate and complete.  Inda Coke, PA-C

## 2020-12-19 NOTE — Telephone Encounter (Signed)
  Follow up Call-  Call back number 12/15/2020  Post procedure Call Back phone  # [phone]  Permission to leave phone message Yes  Some recent data might be hidden     Patient questions:  Do you have a fever, pain , or abdominal swelling? No. Pain Score  0 *  Have you tolerated food without any problems? Yes.    Have you been able to return to your normal activities? Yes.    Do you have any questions about your discharge instructions: Diet   No. Medications  No. Follow up visit  No.  Do you have questions or concerns about your Care? No.  Actions: * If pain score is 4 or above: No action needed, pain <4.  1. Have you developed a fever since your procedure? no  2.   Have you had an respiratory symptoms (SOB or cough) since your procedure? no  3.   Have you tested positive for COVID 19 since your procedure no  4.   Have you had any family members/close contacts diagnosed with the COVID 19 since your procedure?  no   If yes to any of these questions please route to Joylene John, RN and Joella Prince, RN

## 2020-12-22 ENCOUNTER — Other Ambulatory Visit: Payer: Self-pay | Admitting: *Deleted

## 2020-12-22 MED ORDER — ONETOUCH ULTRASOFT LANCETS MISC: 4 refills | Status: DC

## 2020-12-26 ENCOUNTER — Other Ambulatory Visit: Payer: Self-pay | Admitting: Physician Assistant

## 2021-01-02 ENCOUNTER — Ambulatory Visit: Payer: Medicare Other | Admitting: Neurology

## 2021-01-02 ENCOUNTER — Other Ambulatory Visit: Payer: Self-pay

## 2021-01-02 ENCOUNTER — Encounter: Payer: Self-pay | Admitting: Neurology

## 2021-01-02 VITALS — BP 156/79 | HR 60 | Ht 67.0 in | Wt 156.0 lb

## 2021-01-02 DIAGNOSIS — N1831 Chronic kidney disease, stage 3a: Secondary | ICD-10-CM | POA: Diagnosis not present

## 2021-01-02 DIAGNOSIS — Z9989 Dependence on other enabling machines and devices: Secondary | ICD-10-CM

## 2021-01-02 DIAGNOSIS — G4733 Obstructive sleep apnea (adult) (pediatric): Secondary | ICD-10-CM | POA: Diagnosis not present

## 2021-01-02 DIAGNOSIS — G301 Alzheimer's disease with late onset: Secondary | ICD-10-CM | POA: Diagnosis not present

## 2021-01-02 DIAGNOSIS — I10 Essential (primary) hypertension: Secondary | ICD-10-CM | POA: Diagnosis not present

## 2021-01-02 DIAGNOSIS — F0280 Dementia in other diseases classified elsewhere without behavioral disturbance: Secondary | ICD-10-CM

## 2021-01-02 MED ORDER — DONEPEZIL HCL 5 MG PO TBDP: 5.0000 mg | ORAL_TABLET | Freq: Every day | ORAL | 1 refills | Status: DC

## 2021-01-02 NOTE — Progress Notes (Signed)
SLEEP MEDICINE CLINIC   Provider:  Larey Seat, M.D.   Primary Care Physician / Referring Provider: [place] [facility]  Chief Complaint  Patient presents with  . Follow-up    Pt with daughter, rm 54. Presents to follow up for concerns for memory decline. She followed with PCP initially who recommended to address further here. [facility] pt    01-02-2021 Catherine Mcconnell is seen in a RV today, in the presence of her daughter , who voices concerns aout memory loss, her mother got scammed multiple times.  I have followed Catherine Mcconnell for obstructive sleep apnea on CPAP as well as for memory concerns at her last visit in our office Catherine Mcconnell nurse practitioner on July 17, 2020.  Catherine Mcconnell presents today with elevated blood pressure my nurse took her blood pressure 156/79 mmHg today I would really like to repeat this at the end of the visit.  Primary care provider mentioned that the patient has been doing well with iron supplements by mouth, she has diabetes mellitus without complications she has chronic kidney disease, she had cataract Mcconnell bilaterally she has multifocal arthritis this is osteoarthritis and not rheumatoid arthritis and she remains mildly anemic.  Her weight has actually declined from 160 pounds in November last year to 148 pounds.  So a 12 pound weight loss presumed over the last 3-1/2 months.  His I reviewed the patient's medications, the patient is well-developed, she is not ill-appearing she is well-groomed, she is cooperative and friendly.      New problem for this established sleep patient, last seen by NP- Televisit.  Catherine Mcconnell is a 75 year- old african -[place] . female is seen on 03-16-2020 with her daughter. According to her daughter Catherine Mcconnell has forgotten to pay her phone bill which has never happened before, she has gotten lost driving in town on familiar roads.  There were no aggravating factors such as bad weather or nighttime driving.  These  events have left Catherine Mcconnell a little bit shaken. Based on these concerns her physician assistant Catherine Mcconnell has sent the patient for follow-up.    The memory loss is accompanied by a rather poorly controlled diabetes still the HbA1c was 8.4 in April of this year, she has a past medical history of chronic kidney disease which is not graded, arthritis, heart iron deficiency, hypertension, mixed hyperlipidemia and she has a history of a thyroid nodule which was surgically removed. She has a past surgical history of has to hysterectomy in 1996 thyroid Mcconnell a biopsy revealed a benign nodule.  And she had a skin mass removed from her right upper arm in 2017 which  was not cancerous.   She is followed here in our sleep clinic for OSA obstructive sleep apnea on CPAP.  Just to stay for another visit she has been 100% compliant by days, 97% compliant by time average use at time of 6 hours 51 minutes, CPAP is set to 11 cm water pressure with 3 cm EPR her residual apnea-hypopnea index is 2.4/h very good she does have some central apneas arising but overall the main problem may be an air leak but not a lack of control of apnea.  95th percentile air leak was 55 L/min which means that the mask gets dislodged sometimes.  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  I have no doubt that she would score much higher in the Mini-Mental Status Examination which demands less of the patient.  However I would like to repeat the [place] with any future visits if her score drops under 23 we will go to a Mini-Mental status exam instead.  I have asked her to try the best to control her diabetes as this is most likely a contributor to a cognitive impairment  situation, blood pressure seems to be well controlled vital signs will be taken after this visit.  The patient is fully vaccinated she should be able to resume a lot of her normal social activities soon which will also help her to be stimulated and social interaction is one of the best brain driving exercises we can do.  I would like to order an MRI of the brain to make sure we do not have any focal atrophy strokes etc.  I will order this study likely without contrast considering that I do not know her exact kidney function at this time.        Catherine Mcconnell is a 75 y.o. female , seen here as in a referral for transfer of care- The patient has just moved form [place], and has seen Catherine Mcconnell at Catherine [facility]ccon[facility]. She has not been established with a PCP.   I have the pleasure of meeting Catherine Mcconnell today on 23 March 2018.  She moved to [place] several months ago from.  She has a history of hypertension, diabetes mellitus, hyperlipidemia, chronic kidney disease, heart murmur and was evaluated in [facility] [place].  She has an excellent functional status she can walk about a mile without any limitation no shortness of breath is recorded.  Her EKG done at her last primary care physician's office showed left ventricular hypertrophy with normal sinus rhythm.  The patient has no symptoms attributable to valvular heart disease.  An echocardiogram on 01 February 2017 showed normal left ventricular ejection fraction of 60%.  Normal left atrial pressure, minimal tricuspid regurgitation and pulmonary regurgitation, grade 1 diastolic dysfunction.  No functional limitations.  EKG in sinus rhythm as of 09 January 2017.  [facility] also ordered a sleep study on 21 Feb 2017 , resulting in an AHI of 57.3/h with O2 desaturation to a nadir of 81% during non-REM sleep.  CPAP therapy and weight reduction were recommended.  Her cardiologist  was Dr. Manuella Ghazi, Mcconnell, her primary care physician was Catherine Mcconnell.  One of Dr. Trena Platt last office notes from May 20, 2017 stated that the patient's essential primary hypertension -was controlled on Norvasc 10 mg daily and Coreg 6.25 mg twice daily, type 2 diabetes was managed by primary care, hyperlipidemia treated with Lipitor 40 mg at night, CKD with a creatinine of 1.62 is followed now by nephrologist Catherine Mcconnell, cardiac monitor was unspecified the patient was considered not a good candidate for dehydration or diuretics.   About a year ago she had a spell of dehydration and had to go to her local [facility] emergency room where she was IV hydrated.    Obstructive sleep apnea was treated with CPAP at 11 cmH2O pressure.  I told hear from her last sleep study from 12 Mar 2017 the patient's respirations stabilized on the  optimal nasal CPAP pressure of 11 cmH2O.   The AHI was reduced to 0.0/h the EKG showed sinus rhythm with a variable heart rate between 44 bpm and 71 bpm snoring was eliminated with nasal CPAP under a DreamWear mask.  No significant periodic leg movement, REM latency was 88 minutes.  She uses medium small cushions no chinstrap and heated humidity.  Interval history the patient had very good compliance data in [facility] and we were able to also interrogate her machine by Wi-Fi.  Over the 90 day period from September through November 2018 she has used the machine 100% of days, with an average user time of 8 hours and 7 minutes, CPAP is set at 11 cmH2O pressure with 3 cm expiratory pressure relief - her residual AHI in November was 1.1.    Today's compliance record again is 100% average use of time 7 hours 48 minutes, CPAP still set at 11 cmH2O pressure with 3 cm EPR and the residual AHI is 1.7 there were no Cheyne-Stokes respiration arising under therapy.  Air leak varies night by night, her residual apnea is considered low enough to be well controlled.  She continues to feel  comfortable with a DreamWear nasal pillow mask.  Chief complaint according to patient :" I need new supplies."   Sleep habits are as follows: She goes to bed around 11 Pm, usually reading for 30-40 minutes before falling asleep, the bedroom is cool, quiet and dark. The patient sleeps on her side- but moves a lot during sleep. She sleeps on 2 pillows. She sleeps alone. 2-3 nocturias each night. She rises between 7-9 AM, mostly around 8 AM.  She feel rested in AM, no headaches, no palpitations, no lightheadedness were reported..    Medical history and family sleep history: HTN and DM 2 were diagnosed 20 years ago, she was of the same weight as now. No tonsillectomy, no nasal septal deviation.Father was a heavy snorer.    Social history:  She is close to her daughter, high protein ow carb diet just implemented. divorced, 6 biological children, 2 son have passed away , middle son in 2008/01/24 following a liver transplant, the eldest in 21 with pancreatic cancer, both were veterans. Non smoker- seldomly drinking alcohol, Caffeine - consumes iced tea and coffee, not sodas. 1 cup in AM, 1 glass of tea with lunch and dinner.  Worked as Training and development officer .     Review of Systems: Out of a complete 14 system review, the patient complains of only the following symptoms, and all other reviewed systems are negative.   Catherine Mcconnell reports swelling in her legs, snoring which may have been alleviated on the CPAP, anemia, joint pain, birthmarks, nocturia, changes in appetite and restless legs.  Epworth Sleepiness score 11/ 24 - but takes no naps.  Her daughter stated she is napping all the time- dozing off-  Fatigue severity score 32  , depression score 6/15 , she claims pandemic related.   How likely are you to doze in the following situations: 0 = not likely, 1 = slight chance, 2 = moderate chance, 3 = high chance  Sitting and Reading? Watching Television? Sitting inactive in a public place (theater or  meeting)? Lying down in the afternoon when circumstances permit? Sitting and talking to someone? Sitting quietly after lunch without alcohol? In a car, while stopped for a few minutes in traffic? As a passenger in a car for an hour without a break?  Total =14/  24  Any time of the day.   Night owl.   Right leg swelling, spasms in the left leg.      Social History   Socioeconomic History  . Marital status: Divorced    Spouse name: Not on file  . Number of children: 6  . Years of education: some college  . Highest education level: Not on file  Occupational History  . Occupation: Retired   Tobacco Use  . Smoking status: Former Research scientist (life sciences)  . Smokeless tobacco: Never Used  . Tobacco comment: only socially in high school  Vaping Use  . Vaping Use: Never used  Substance and Sexual Activity  . Alcohol use: Not Currently  . Drug use: Never  . Sexual activity: Not on file  Other Topics Concern  . Not on file  Social History Narrative   03/16/20 lives with dgtr Weber Mcconnell   Mother of 6   --Two children have passed, one from from prostate cancer and one from liver failure   Used to be a CNA, Child psychotherapist   Recently moved back to [facility]   Financial Resource Strain: Low Risk   . Difficulty of Paying Living Expenses: Not hard at all  Food Insecurity: No Food Insecurity  . Worried About Charity fundraiser in the Last Year: Never true  . Ran Out of Food in the Last Year: Never true  Transportation Needs: No Transportation Needs  . Lack of Transportation (Medical): No  . Lack of Transportation (Non-Medical): No  Physical Activity: Insufficiently Active  . Days of Exercise per Week: 5 days  . Minutes of Exercise per Session: 20 min  Stress: No Stress Concern Present  . Feeling of Stress : Not at all  Social Connections: Socially Isolated  . Frequency of Communication with Friends and Family: Never  . Frequency of Social Gatherings with Friends and Family:  Never  . Attends Religious Services: More than 4 times per year  . Active Member of Clubs or Organizations: No  . Attends Archivist Meetings: Never  . Marital Status: Divorced  Human resources officer Violence: Not At Risk  . Fear of Current or Ex-Partner: No  . Emotionally Abused: No  . Physically Abused: No  . Sexually Abused: No    Family History  Problem Relation Age of Onset  . Stroke Mother   . Hypertension Mother   . Heart disease Mother   . Heart attack Father   . Early death Father   . Hypertension Father   . Diabetes Sister   . Early death Sister   . Hypertension Sister   . Stroke Sister   . Prostate cancer Son   . Cancer Son   . Early death Son   . Breast cancer Cousin   . Diabetes Sister   . Early death Sister   . Hyperlipidemia Sister   . Kidney disease Sister   . Depression Daughter   . Hypertension Daughter   . Hyperlipidemia Daughter   . Diabetes Sister   . Hyperlipidemia Sister   . Hypertension Sister   . Early death Brother   . Arthritis Brother   . Heart attack Brother   . Heart disease Brother   . Arthritis Brother   . Diabetes Sister   . Colon polyps Neg Hx   . Colon cancer Neg Hx   . Esophageal cancer Neg Hx   . Rectal cancer Neg Hx   . Stomach cancer Neg Hx  Past Medical History:  Diagnosis Date  . Anemia    on meds  . Arthritis    hips/wrists/back  . Cataract    sx-bilateral  . Chronic kidney disease    stage 3  . Diabetes mellitus without complication ([place])    on meds  . Hypertension    on meds  . Iron deficiency    on oral iron supplements, has not required transfusion  . Mixed hyperlipidemia    on meds  . OSA on CPAP    compliant  . Sleep apnea    uses CPAP  . Thyroid nodule    removed-not on meds at this time (12/01/2020)      Current Outpatient Medications  Medication Sig Dispense Refill  . acetaminophen-codeine (TYLENOL #3) 300-30 MG tablet Take 2 tablets by mouth every 8 (eight) hours as needed for  severe pain. 30 tablet 0  . amLODipine (NORVASC) 10 MG tablet TAKE 1 TABLET BY MOUTH  DAILY 90 tablet 3  . aspirin EC 81 MG tablet Take 81 mg by mouth daily.    Marland Kitchen atorvastatin (LIPITOR) 40 MG tablet TAKE 1 TABLET BY MOUTH  DAILY 90 tablet 3  . carvedilol (COREG) 6.25 MG tablet TAKE 1 TABLET(6.25 MG) BY MOUTH TWICE DAILY WITH A MEAL 180 tablet 0  . ferrous sulfate (IRON SUPPLEMENT) 325 (65 FE) MG tablet Take 1 tablet (325 mg total) by mouth 2 (two) times daily with a meal. 60 tablet 2  . furosemide (LASIX) 20 MG tablet Take 1 tablet (20 mg total) by mouth daily as needed for edema. 30 tablet 1  . gabapentin (NEURONTIN) 100 MG capsule TAKE 3 CAPSULES BY MOUTH  DAILY (Patient taking differently: Take 100 mg by mouth daily in the afternoon. TAKE 3 CAPSULES BY MOUTH DAILY.) 270 capsule 3  . insulin degludec (TRESIBA FLEXTOUCH) 100 UNIT/ML FlexTouch Pen Inject 0.1 mLs (10 Units total) into the skin daily. 15 mL 3  . Insulin Pen Needle 32G X 4 MM MISC 1 each by Does not apply route as needed. 100 each 3  . ketoconazole (NIZORAL) 2 % cream Apply 1 fingertip amount to each foot daily. (Patient taking differently: Apply 1 application topically daily as needed. Apply 1 fingertip amount to each foot daily.) 30 g 0  . Lancets (ONETOUCH ULTRASOFT) lancets CHECK TWICE PER DAY BEFORE  BREAKFAST AND DINNER 200 each 4  . losartan (COZAAR) 50 MG tablet Take 50 mg by mouth daily.    Marland Kitchen LUMIGAN 0.01 % SOLN Place 1 drop into both eyes every evening.    Glory Rosebush ULTRA test strip USE TO TEST BLOOD SUGARS  TWICE DAILY 200 strip 3  . Semaglutide (RYBELSUS) 14 MG TABS Take 14 mg by mouth daily. 90 tablet 2   No current facility-administered medications for this visit.    Allergies as of 01/02/2021 - Review Complete 01/02/2021  Allergen Reaction Noted  . Lisinopril Rash 03/23/2018  . Metformin and related Swelling 03/23/2018    Vitals: BP (!) 156/79   Pulse 60   Ht 5\' 7"  (1.702 m)   Wt 156 lb (70.8 kg)   BMI  24.43 kg/m  Last Weight:  Wt Readings from Last 1 Encounters:  01/02/21 156 lb (70.8 kg)   CBJ:SEGB mass index is 24.43 kg/m.     Last Height:    Ht Readings from Last 1 Encounters:  01/02/21 5\' 7"  (1.702 m)   the patient lost over 12 pounds in 3.5 months.   Physical exam:  General: The patient is awake, alert and appears not in acute distress. The patient is well groomed. Head: Normocephalic, atraumatic. Neck is supple. Mallampati 4,  full plate dentures.  neck circumference 14 . Nasal airflow patent . Retrognathia is grade 2 .  Cardiovascular:  Regular rate and rhythm , without  murmurs or carotid bruit, and without distended neck veins. Respiratory: Lungs are clear to auscultation. Skin:  With  evidence of edema. Trunk: BMI is now 24 .4 .  Neurologic exam : The patient is awake and alert, oriented to place and time.   Memory subjective  described as intact by her, but her daughter disagrees.  Montreal Cognitive Assessment  01/02/2021 07/17/2020 03/16/2020  Visuospatial/ Executive (0/5) 5 5 4   Naming (0/3) 3 3 3   Attention: Read list of digits (0/2) 0 2 1  Attention: Read list of letters (0/1) 1 1 1   Attention: Serial 7 subtraction starting at 100 (0/3) 1 2 3   Language: Repeat phrase (0/2) 2 2 2   Language : Fluency (0/1) 1 0 1  Abstraction (0/2) 2 2 2   Delayed Recall (0/5) 1 2 1   Orientation (0/6) 6 6 6   Total 22 25 24   Adjusted Score (based on education) - 25 -   .  Attention span & concentration ability appears limited- .  Speech is fluent,  without dysarthria, mild dysphonia.  Mood and affect are appropriate.  Cranial nerves: no loss of smell or taste.  Pupils are equal and briskly reactive to light. Funduscopic exam deferred.   Extraocular movements in vertical and horizontal planes intact and still without nystagmus. Visual fields by finger perimetry are intact. Hearing to finger rub intact.   Facial sensation intact to fine touch.  Facial motor strength is  symmetric- no longer pale mucosa-   and tongue and uvula move midline. Shoulder shrug was symmetrical.   Motor exam:  Normal tone, muscle bulk and symmetric strength in all extremities. She has some ROM limited by arthritis.  Sensory:  Fine touch and vibration were tested in all extremities.  Coordination: Rapid alternating movements in the fingers/hands was normal.  Finger-to-nose maneuver without evidence of ataxia, dysmetria or tremor. Gait and station: Patient walks without assistive device. Deep tendon reflexes: in the upper and lower extremities are symmetric but trace only.   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 Father had dementia. MOCA 22/ 30-  HS graduate with accounting degree.  She has been scammed and used to be a Chief of Staff- this is a special worry for her family members.   2)This patient is a CPAP user for 13 month and has been highly compliant, feels better on CPAP, sleeps deeper. The patient has been a highly compliant CPAP user with 27 out of 30 days of CPAP use and 25 of these days was over 4 hours consecutive use.  Average use at time is just 5 hours 24 minutes the machine is set at 11 cm water pressure with 3 cm EPR her residual AHI is 3.3/h, this speaks for a very good resolution of apnea.  Of her residual apneas most are central.   She does have high  air leakage which is usually dependent on the CPAP mask fit and also how restless patient may sleep.  She is using an air sense 10 AutoSet with a serial #[id number]. The headgear is no longer elastic.    3) some EDS- she has still some  residual excessive sleepiness while on CPAP . May be related to CKD, DM, HTN , anemia and related medications. Iron supplement has helped anemia, and she lost weight intentionally.   The patient was advised of the nature of the diagnosed disorder , the treatment options and the  risks for general health and wellness arising from not treating the condition.   I spent more than 35 minutes of face to face time with the patient.  Greater than 50% of time was spent in counseling and coordination of care. We have discussed the diagnosis and differential and I answered the patient's questions.    Specimen Collected: 02/11/20 15:12 Last Resulted: 02/12/20 20:30         36 minutes.    Plan:  Treatment plan and additional workup :   1) Tyyonna P. Mcconnell presents with a slow increase in memory lapses, desire at this time more or less anecdotes that do not happen every day -but they have been significant enough to see the difference since she had never forgotten before to pay an electricity or phone bill and she is not used to getting lost. This is early dementia.  Her daughter is reporting she got scammed more than once and was asked to send 500 USD gift cards to contact on FB messenger- there is no way to retrieve this money    Start Aricept 5 mg po,  Daughter would like senior club activities. [facility], [facility] in daytime.  Financial supervision- set up need for second person agreement on unusual transactions.Marland Kitchen    2) I will reorder CPAP supplies through [facility] - she lives close to [place]. CPAP supplies entail: AirFit P 10  mask, small size nasal pillow- and supplies for ResMed Airsense  10 .  She may consider a bella swift nasal pillow mask.  Her machine is about 75 years old, she will be due for a new machine in 12 month.  I like a HST prior to replacing the machine.    Larey Seat, [facility] 04/19/8674, 4:49 PM  Certified in Neurology by ABPN Certified in [place]  by [facility] [address], [place], [place] [address]

## 2021-01-02 NOTE — Patient Instructions (Addendum)
Donepezil tablets What is this medicine? DONEPEZIL (doe NEP e zil) is used to treat mild to moderate dementia caused by Alzheimer's disease. This medicine may be used for other purposes; ask your health care provider or pharmacist if you have questions. COMMON BRAND NAME(S): Aricept What should I tell my health care provider before I take this medicine? They need to know if you have any of these conditions:  asthma or other lung disease  difficulty passing urine  head injury  heart disease  history of irregular heartbeat  liver disease  seizures (convulsions)  stomach or intestinal disease, ulcers or stomach bleeding  an unusual or allergic reaction to donepezil, other medicines, foods, dyes, or preservatives  pregnant or trying to get pregnant  breast-feeding How should I use this medicine? Take this medicine by mouth with a glass of water. Follow the directions on the prescription label. You may take this medicine with or without food. Take this medicine at regular intervals. This medicine is usually taken before bedtime. Do not take it more often than directed. Continue to take your medicine even if you feel better. Do not stop taking except on your doctor's advice. If you are taking the 23 mg donepezil tablet, swallow it whole; do not cut, crush, or chew it. Talk to your pediatrician regarding the use of this medicine in children. Special care may be needed. Overdosage: If you think you have taken too much of this medicine contact a poison control center or emergency room at once. NOTE: This medicine is only for you. Do not share this medicine with others. What if I miss a dose? If you miss a dose, take it as soon as you can. If it is almost time for your next dose, take only that dose, do not take double or extra doses. What may interact with this medicine? Do not take this medicine with any of the following medications:  certain medicines for fungal infections like  itraconazole, fluconazole, posaconazole, and voriconazole  cisapride  dextromethorphan; quinidine  dronedarone  pimozide  quinidine  thioridazine This medicine may also interact with the following medications:  antihistamines for allergy, cough and cold  atropine  bethanechol  carbamazepine  certain medicines for bladder problems like oxybutynin, tolterodine  certain medicines for Parkinson's disease like benztropine, trihexyphenidyl  certain medicines for stomach problems like dicyclomine, hyoscyamine  certain medicines for travel sickness like scopolamine  dexamethasone  dofetilide  ipratropium  NSAIDs, medicines for pain and inflammation, like ibuprofen or naproxen  other medicines for Alzheimer's disease  other medicines that prolong the QT interval (cause an abnormal heart rhythm)  phenobarbital  phenytoin  rifampin, rifabutin or rifapentine  ziprasidone This list may not describe all possible interactions. Give your health care provider a list of all the medicines, herbs, non-prescription drugs, or dietary supplements you use. Also tell them if you smoke, drink alcohol, or use illegal drugs. Some items may interact with your medicine. What should I watch for while using this medicine? Visit your doctor or health care professional for regular checks on your progress. Check with your doctor or health care professional if your symptoms do not get better or if they get worse. You may get drowsy or dizzy. Do not drive, use machinery, or do anything that needs mental alertness until you know how this drug affects you. What side effects may I notice from receiving this medicine? Side effects that you should report to your doctor or health care professional as soon as  possible:  allergic reactions like skin rash, itching or hives, swelling of the face, lips, or tongue  feeling faint or lightheaded, falls  loss of bladder control  seizures  signs and  symptoms of a dangerous change in heartbeat or heart rhythm like chest pain; dizziness; fast or irregular heartbeat; palpitations; feeling faint or lightheaded, falls; breathing problems  signs and symptoms of infection like fever or chills; cough; sore throat; pain or trouble passing urine  signs and symptoms of liver injury like dark yellow or brown urine; general ill feeling or flu-like symptoms; light-colored stools; loss of appetite; nausea; right upper belly pain; unusually weak or tired; yellowing of the eyes or skin  slow heartbeat or palpitations  unusual bleeding or bruising  vomiting Side effects that usually do not require medical attention (report to your doctor or health care professional if they continue or are bothersome):  diarrhea, especially when starting treatment  headache  loss of appetite  muscle cramps  nausea  stomach upset This list may not describe all possible side effects. Call your doctor for medical advice about side effects. You may report side effects to FDA at 1-800-FDA-1088. Where should I keep my medicine? Keep out of reach of children. Store at room temperature between 15 and 30 degrees C (59 and 86 degrees F). Throw away any unused medicine after the expiration date. NOTE: This sheet is a summary. It may not cover all possible information. If you have questions about this medicine, talk to your doctor, pharmacist, or health care provider.  2021 Elsevier/Gold Standard (2018-09-21 10:33:41)  Treatment plan and additional workup :   1) Catherine Mcconnell presents with a slow increase in memory lapses, desire at this time more or less anecdotes that do not happen every day -but they have been significant enough to see the difference since she had never forgotten before to pay an electricity or phone bill and she is not used to getting lost. This is early dementia.  Her daughter is reporting she got scammed more than once and was asked to send 500  USD gift cards to contact on FB messenger- there is no way to retrieve this money    Start Aricept 5 mg po,  Daughter would like senior club activities. [facility], [facility] in daytime.  Financial supervision- set up need for second person agreement on unusual transactions.Marland Kitchen    2) I will reorder CPAP supplies through [facility] - she lives close to [place]. CPAP supplies entail: AirFit P 10  mask, small size nasal pillow- and supplies for ResMed Airsense  10 .  She may consider a bella swift nasal pillow mask.  Her machine is about 75 years old, she will be due for a new machine in 12 month.  I like a HST prior to replacing the machine.    Larey Seat, MD 1/61/0960, 4:54 PM  Certified in Neurology by ABPN Certified in [place] by [facility] [address] [place], [place] [address]

## 2021-01-09 ENCOUNTER — Encounter: Payer: Self-pay | Admitting: Neurology

## 2021-01-09 ENCOUNTER — Other Ambulatory Visit: Payer: Self-pay | Admitting: Neurology

## 2021-01-09 MED ORDER — DONEPEZIL HCL 5 MG PO TBDP: 5.0000 mg | ORAL_TABLET | Freq: Every day | ORAL | 0 refills | Status: DC

## 2021-01-11 ENCOUNTER — Ambulatory Visit: Payer: Medicare Other | Admitting: Internal Medicine

## 2021-01-11 ENCOUNTER — Encounter: Payer: Self-pay | Admitting: Internal Medicine

## 2021-01-11 ENCOUNTER — Other Ambulatory Visit: Payer: Self-pay

## 2021-01-11 VITALS — BP 132/72 | HR 78 | Ht 67.0 in | Wt 160.1 lb

## 2021-01-11 DIAGNOSIS — N1831 Chronic kidney disease, stage 3a: Secondary | ICD-10-CM | POA: Diagnosis not present

## 2021-01-11 DIAGNOSIS — E1122 Type 2 diabetes mellitus with diabetic chronic kidney disease: Secondary | ICD-10-CM | POA: Diagnosis not present

## 2021-01-11 DIAGNOSIS — E1142 Type 2 diabetes mellitus with diabetic polyneuropathy: Secondary | ICD-10-CM | POA: Diagnosis not present

## 2021-01-11 LAB — POCT GLYCOSYLATED HEMOGLOBIN (HGB A1C): Hemoglobin A1C: 7.4 % — AB (ref 4.0–5.6)

## 2021-01-11 MED ORDER — TRESIBA FLEXTOUCH 100 UNIT/ML ~~LOC~~ SOPN: 8.0000 [IU] | PEN_INJECTOR | Freq: Every day | SUBCUTANEOUS | 3 refills | Status: DC

## 2021-01-11 NOTE — Patient Instructions (Addendum)
-   Keep up the Good Work ! A1c 7.4 %  - Continue  Rybelsus 14 mg daily  With Breakfast  - Decrease Tresiba 8 units daily     HOW TO TREAT LOW BLOOD SUGARS (Blood sugar LESS THAN 70 MG/DL)  Please follow the RULE OF 15 for the treatment of hypoglycemia treatment (when your (blood sugars are less than 70 mg/dL)    STEP 1: Take 15 grams of carbohydrates when your blood sugar is low, which includes:   3-4 GLUCOSE TABS  OR  3-4 OZ OF JUICE OR REGULAR SODA OR  ONE TUBE OF GLUCOSE GEL     STEP 2: RECHECK blood sugar in 15 MINUTES STEP 3: If your blood sugar is still low at the 15 minute recheck --> then, go back to STEP 1 and treat AGAIN with another 15 grams of carbohydrates.

## 2021-01-11 NOTE — Progress Notes (Signed)
Name: Catherine Mcconnell  Age/ Sex: 75 y.o., female   MRN/ DOB: [id number], 17-Aug-1946     PCP: Inda Coke, PA   Reason for Endocrinology Evaluation: Type 2 Diabetes Mellitus  Initial Endocrine Consultative Visit: 08/10/2019    PATIENT IDENTIFIER: Catherine Mcconnell is a 75 y.o. female with a past medical history of T2DM and HTN. The patient has followed with Endocrinology clinic since 08/10/2019 for consultative assistance with management of her diabetes.  DIABETIC HISTORY:  Catherine Mcconnell was diagnosed with DM at age 58.  Her hemoglobin A1c has ranged from 7.7% in 2021, peaking at 10.6%  in 2021    She transitioned care from Dr. Loanne Drilling in  05/2020. She was on Januvia, Rybelsus and had tresiba on her med list but she has stopped it due to swelling. Her A1c was 10.6 % in 04/2020 . We stopped Januvia, increased rybelsus and restarted [place]   SUBJECTIVE:   During the last visit (): A1c 8.5 % . increased Rybelsus and continued  basal insulin   Today (01/11/2021): Catherine Mcconnell is here for a follow up on diabetes management. She is accompanied by daughter Hilma Favors .  She checks her blood sugars 2 times daily. The patient has not had hypoglycemic episodes since the last clinic visit.   Denies nausea , diarrhea or vomiting   HOME DIABETES REGIMEN:  Rybelsus 14 mg daily  With Breakfast  Tresiba 10 units daily      Statin: yes ACE-I/ARB: yes    METER DOWNLOAD SUMMARY: 3/17-3/31/2022  Average Number Tests/Day = 1.9 Overall Mean FS Glucose = 127 Standard Deviation = 57  BG Ranges: Low = 73 High = 256    Hypoglycemic Events/30 Days: BG < 50 = 0 Episodes of symptomatic severe hypoglycemia = 0      DIABETIC COMPLICATIONS: Microvascular complications:   Neuropathy, CKD III  Denies: retinopathy  Last Eye Exam: Completed   Macrovascular complications:    Denies: CAD, CVA, PVD   HISTORY:  Past Medical History:  Past Medical History:  Diagnosis Date  . Anemia     on meds  . Arthritis    hips/wrists/back  . Cataract    sx-bilateral  . Chronic kidney disease    stage 3  . Diabetes mellitus without complication ([place])    on meds  . Hypertension    on meds  . Iron deficiency    on oral iron supplements, has not required transfusion  . Mixed hyperlipidemia    on meds  . OSA on CPAP    compliant  . Sleep apnea    uses CPAP  . Thyroid nodule    removed-not on meds at this time (12/01/2020)   Past Surgical History:  Past Surgical History:  Procedure Laterality Date  . ABDOMINAL HYSTERECTOMY  1996  . MASS EXCISION Right 2017   right arm mass removal  . THYROID SURGERY     left side removal benign nodule    Social History:  reports that she has quit smoking. She has never used smokeless tobacco. She reports previous alcohol use. She reports that she does not use drugs. Family History:  Family History  Problem Relation Age of Onset  . Stroke Mother   . Hypertension Mother   . Heart disease Mother   . Heart attack Father   . Early death Father   . Hypertension Father   . Diabetes Sister   . Early death Sister   . Hypertension Sister   . Stroke  Sister   . Prostate cancer Son   . Cancer Son   . Early death Son   . Breast cancer Cousin   . Diabetes Sister   . Early death Sister   . Hyperlipidemia Sister   . Kidney disease Sister   . Depression Daughter   . Hypertension Daughter   . Hyperlipidemia Daughter   . Diabetes Sister   . Hyperlipidemia Sister   . Hypertension Sister   . Early death Brother   . Arthritis Brother   . Heart attack Brother   . Heart disease Brother   . Arthritis Brother   . Diabetes Sister   . Colon polyps Neg Hx   . Colon cancer Neg Hx   . Esophageal cancer Neg Hx   . Rectal cancer Neg Hx   . Stomach cancer Neg Hx      HOME MEDICATIONS: Allergies as of 01/11/2021      Reactions   Lisinopril Rash   Metformin And Related Swelling      Medication List       Accurate as of January 11, 2021   9:38 AM. If you have any questions, ask your nurse or doctor.        acetaminophen-codeine 300-30 MG tablet Commonly known as: TYLENOL #3 Take 2 tablets by mouth every 8 (eight) hours as needed for severe pain.   amLODipine 10 MG tablet Commonly known as: NORVASC TAKE 1 TABLET BY MOUTH  DAILY   aspirin EC 81 MG tablet Take 81 mg by mouth daily.   atorvastatin 40 MG tablet Commonly known as: LIPITOR TAKE 1 TABLET BY MOUTH  DAILY   carvedilol 6.25 MG tablet Commonly known as: COREG TAKE 1 TABLET(6.25 MG) BY MOUTH TWICE DAILY WITH A MEAL   donepezil 5 MG disintegrating tablet Commonly known as: ARICEPT ODT Take 1 tablet (5 mg total) by mouth at bedtime.   donepezil 5 MG disintegrating tablet Commonly known as: ARICEPT ODT Take 1 tablet (5 mg total) by mouth at bedtime.   ferrous sulfate 325 (65 FE) MG tablet Commonly known as: Iron Supplement Take 1 tablet (325 mg total) by mouth 2 (two) times daily with a meal.   furosemide 20 MG tablet Commonly known as: LASIX Take 1 tablet (20 mg total) by mouth daily as needed for edema.   gabapentin 100 MG capsule Commonly known as: NEURONTIN TAKE 3 CAPSULES BY MOUTH  DAILY What changed:   how much to take  when to take this  additional instructions   Insulin Pen Needle 32G X 4 MM Misc 1 each by Does not apply route as needed.   ketoconazole 2 % cream Commonly known as: NIZORAL Apply 1 fingertip amount to each foot daily. What changed:   how much to take  how to take this  when to take this  reasons to take this   losartan 50 MG tablet Commonly known as: COZAAR Take 50 mg by mouth daily.   Lumigan 0.01 % Soln Generic drug: bimatoprost Place 1 drop into both eyes every evening.   OneTouch Ultra test strip Generic drug: glucose blood USE TO TEST BLOOD SUGARS  TWICE DAILY   onetouch ultrasoft lancets CHECK TWICE PER DAY BEFORE  BREAKFAST AND DINNER   Rybelsus 14 MG Tabs Generic drug: Semaglutide Take  14 mg by mouth daily.   Tyler Aas FlexTouch 100 UNIT/ML FlexTouch Pen Generic drug: insulin degludec Inject 8 Units into the skin daily. What changed: how much to take Changed by: Mammie Lorenzo  Verda Cumins, MD        OBJECTIVE:   Vital Signs: BP 132/72   Pulse 78   Ht 5\' 7"  (1.702 m)   Wt 160 lb 2 oz (72.6 kg)   SpO2 98%   BMI 25.08 kg/m   Wt Readings from Last 3 Encounters:  01/11/21 160 lb 2 oz (72.6 kg)  01/02/21 156 lb (70.8 kg)  12/19/20 148 lb 9.6 oz (67.4 kg)     Exam: General: Pt appears well and is in NAD  Lungs: Clear with good BS bilat with no rales, rhonchi, or wheezes  Heart: RRR   Extremities: No pretibial edema. .  Neuro: MS is good with appropriate affect, pt is alert and Ox3      DM foot exam: 06/06/2020  The skin of the feet is intact without sores or ulcerations. The pedal pulses are 2+ on right and 2+ on left. The sensation is intact to a screening 5.07, 10 gram monofilament bilaterally   DATA REVIEWED:  Lab Results  Component Value Date   HGBA1C 7.4 (A) 01/11/2021   HGBA1C 8.5 (A) 09/11/2020   HGBA1C 10.6 (A) 04/21/2020   Lab Results  Component Value Date   MICROALBUR 140.4 06/06/2020   LDLCALC 134 (H) 06/06/2020   CREATININE 1.44 (H) 06/14/2020   Lab Results  Component Value Date   MICRALBCREAT 1,543 (H) 06/06/2020     Lab Results  Component Value Date   CHOL 217 (H) 06/06/2020   HDL 61 06/06/2020   LDLCALC 134 (H) 06/06/2020   TRIG 108 06/06/2020   CHOLHDL 3.6 06/06/2020         ASSESSMENT / PLAN / RECOMMENDATIONS:   1) Type 2 Diabetes Mellitus,Optimally  controlled, With neuropathic and CKD III  complications - Most recent A1c of 8.5 %. Goal A1c < 7.5 %.     - I have praised the pt on optimizing  glucose control  - Fasting BG's tight in the 70's , will reduce basal insulin  -She does have very few dietary indiscretions with postprandial BG's in the 200s, we did discuss limiting the amount of carbohydrates with such  hyperglycemia.  MEDICATIONS:  - Continue Rybelsus 14 mg daily  With Breakfast  - Decrease Tresiba 8 units daily    EDUCATION / INSTRUCTIONS:  BG monitoring instructions: Patient is instructed to check her blood sugars 2 times a day, fasting and bedtime .  Call [facility] [facility] if: BG persistently < 70  . I reviewed the Rule of 15 for the treatment of hypoglycemia in detail with the patient. Literature supplied.   2) Diabetic complications:   Eye: Does not have known diabetic retinopathy.   Neuro/ Feet: Does  have known diabetic peripheral neuropathy .   Renal: Patient does  have known baseline CKD. She   is  on an ACEI/ARB at present.     F/U in 6  months    Signed electronically by: Mack Guise, MD  [facility]  [address] [place], [place] [address] Phone: [phone] FAX: [phone]   CC: Inda Coke, [place] [address] Phone: [phone]  Fax: [phone]  Return to Endocrinology clinic as below: Future Appointments  Date Time Provider [place]  02/13/2021 10:45 AM Evelina Bucy, DPM [facility]  04/12/2021  2:30 PM Ward Givens, NP [facility] None  10/01/2021  9:30 AM [facility]

## 2021-01-17 ENCOUNTER — Ambulatory Visit: Payer: Medicare Other | Admitting: Adult Health

## 2021-01-19 ENCOUNTER — Other Ambulatory Visit: Payer: Self-pay | Admitting: Internal Medicine

## 2021-01-19 MED ORDER — INSULIN PEN NEEDLE 32G X 4 MM MISC: 1.0000 | 3 refills | Status: DC | PRN

## 2021-01-19 MED ORDER — TRESIBA FLEXTOUCH 100 UNIT/ML ~~LOC~~ SOPN: 8.0000 [IU] | PEN_INJECTOR | Freq: Every day | SUBCUTANEOUS | 3 refills | Status: DC

## 2021-01-27 ENCOUNTER — Other Ambulatory Visit: Payer: Self-pay | Admitting: Physician Assistant

## 2021-01-28 ENCOUNTER — Other Ambulatory Visit: Payer: Self-pay | Admitting: Physician Assistant

## 2021-01-28 ENCOUNTER — Other Ambulatory Visit: Payer: Self-pay | Admitting: Internal Medicine

## 2021-02-01 ENCOUNTER — Telehealth: Payer: Self-pay

## 2021-02-01 NOTE — Telephone Encounter (Signed)
Spoke to pt's daughter Rockwell [place], asked her when pt is coming home? She said pt will be home on Sunday, and that her blood sugars have been below 100 and she is on the Rybelsus 14 mg daily and thinks she should be fine. Told her yes, she will probable be okay just keep track of sugars and be aware of high or low blood sugar symptoms. Willimena verbalized understanding and will let pt know.

## 2021-02-01 NOTE — Telephone Encounter (Signed)
Pt's daughter states her mom is out of town and forgot her insulin pen. Daughter wants to know if they should try to get to insurance to cover another one or what they should do/ if she'd be okay without it.

## 2021-02-06 ENCOUNTER — Other Ambulatory Visit: Payer: Self-pay | Admitting: Physician Assistant

## 2021-02-06 DIAGNOSIS — I10 Essential (primary) hypertension: Secondary | ICD-10-CM

## 2021-02-13 ENCOUNTER — Ambulatory Visit: Payer: Medicare Other | Admitting: Podiatry

## 2021-02-27 ENCOUNTER — Other Ambulatory Visit: Payer: Self-pay

## 2021-02-27 ENCOUNTER — Ambulatory Visit: Payer: Medicare Other | Admitting: Family Medicine

## 2021-02-27 ENCOUNTER — Encounter: Payer: Self-pay | Admitting: Family Medicine

## 2021-02-27 VITALS — BP 170/100 | HR 63 | Ht 67.0 in | Wt 160.4 lb

## 2021-02-27 DIAGNOSIS — M7061 Trochanteric bursitis, right hip: Secondary | ICD-10-CM

## 2021-02-27 DIAGNOSIS — M5416 Radiculopathy, lumbar region: Secondary | ICD-10-CM

## 2021-02-27 NOTE — Patient Instructions (Addendum)
Good to see you today.   I've referred you to Physical Therapy.  Let us know if you don't hear from them in one week.  Recheck with me in 2 months.   Return sooner if needed especially if you need an injection.   If you need anything please let me know.

## 2021-02-27 NOTE — Progress Notes (Signed)
I, Wendy Poet, LAT, ATC, am serving as scribe for Dr. Lynne Leader.  Catherine Mcconnell is a 75 y.o. female who presents to [place] [facility] today for R hip pain.  She was last seen by Dr. Georgina Snell on 08/29/20 for R foot drop and lumbar radiculopathy.  Since then, pt reports R hip pain x few weeks .  She locates her pain to her R lateral hip.  Radiating pain: No Aggravating factors: walking/weight-bearing Treatments tried: Tramadol; Tylenol w/ codeine  Diagnostic imaging: L-spine MRI- 08/26/20, L-spine and R hip XR- 06/20/20  Pertinent review of systems: No fevers or chills  Relevant historical information: Hypertension and diabetes   Exam:  BP (!) 170/100 (BP Location: Right Arm, Patient Position: Sitting, Cuff Size: Normal)   Pulse 63   Ht 5\' 7"  (1.702 m)   Wt 160 lb 6.4 oz (72.8 kg)   SpO2 98%   BMI 25.12 kg/m  General: Well Developed, well nourished, and in no acute distress.   MSK: Right hip normal-appearing Normal motion. Tender palpation greater trochanter. Hip abduction and external rotation strength diminished 4/5. Mild antalgic gait.  Right foot dorsiflexion slightly diminished 4+/5.  No foot drop gait.    Lab and Radiology Results EXAM: DG HIP (WITH OR WITHOUT PELVIS) 2-3V RIGHT  COMPARISON:  None.  FINDINGS: Upright frontal pelvis as well as upright frontal and supine lateral right hip images were obtained. There is no evident acute fracture or dislocation. There is mild narrowing of each hip joint, slightly more notable on the right than the left. There is apparent calcification in the inferior aspect of the right hip joint medially. There is bony overgrowth along each superolateral acetabulum. Sacroiliac joints appear symmetric bilaterally.  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 Calcification within the medial inferior right hip joint may represent residua of  old trauma. Bony overgrowth along each superolateral acetabulum potentially places patient at increased risk for femoroacetabular impingement.  No fracture or dislocation.   Electronically Signed   By: Lowella Grip III M.D.   On: 06/20/2020 13:02  I, Lynne Leader, personally (independently) visualized and performed the interpretation of the images attached in this note.     Assessment and Plan: 75 y.o. female with right lateral hip pain due to trochanteric bursitis.  Patient had similar episode of this around October of last year and did very well with physical therapy.  Symptoms have now returned.  Discussed options.  Plan to refer back to physical therapy.  She should do well with a strengthening program.  She also had great benefit with iontophoresis.  Would like to avoid steroid injection if possible.  Recheck back with me in 6 to 8 weeks.  Of note I saw her most recently in November for foot drop thought to be due to lumbar radiculopathy.  She had epidural steroid injection November 23 and this has significantly improved.  She only has minimal weakness to right foot dorsiflexion.   PDMP not reviewed this encounter. Orders Placed This Encounter  Procedures  . Ambulatory referral to Physical Therapy    Referral Priority:   Routine    Referral Type:   Physical Medicine    Referral Reason:   Specialty Services Required    Requested Specialty:   Physical Therapy   No orders of the defined types were placed in this encounter.    Discussed warning signs or symptoms. Please see discharge instructions.  Patient expresses understanding.   The above documentation has been reviewed and is accurate and complete Lynne Leader, M.D.

## 2021-03-05 ENCOUNTER — Other Ambulatory Visit: Payer: Self-pay

## 2021-03-05 ENCOUNTER — Encounter: Payer: Self-pay | Admitting: Physical Therapy

## 2021-03-05 ENCOUNTER — Ambulatory Visit: Payer: Medicare Other | Admitting: Physical Therapy

## 2021-03-05 DIAGNOSIS — M25551 Pain in right hip: Secondary | ICD-10-CM | POA: Diagnosis not present

## 2021-03-05 NOTE — Patient Instructions (Signed)
Access Code: [email or web address] URL: [email or web address] Date: 03/05/2021 Prepared by: [name]  Exercises Supine Single Knee to Chest Stretch - 2 x daily - 3 reps - 30 hold Supine Figure 4 Piriformis Stretch with Leg Extension - 2 x daily - 3 reps - 30 hold Seated Piriformis Stretch with Trunk Bend - 1 x daily - 3 reps - 30 hold Sidelying Hip Abduction - 1 x daily - 2 sets - 10 reps Supine Bridge - 1 x daily - 2 sets - 10 reps Hooklying Clamshell with Resistance - 1 x daily - 2 sets - 10 reps

## 2021-03-05 NOTE — Therapy (Signed)
[place], [place], [address] Phone: [phone]   Fax:  [phone]  Physical Therapy Evaluation  Patient Details  Name: Catherine Mcconnell MRN: [id number] Date of Birth: 1945-12-30 Referring Provider (PT): Lynne Leader   Encounter Date: 03/05/2021   PT End of Session - 03/05/21 1110    Visit Number 1    Number of Visits 12    Date for PT Re-Evaluation 04/16/21    Authorization Type UHC    PT Start Time 1106    PT Stop Time 1145    PT Time Calculation (min) 39 min    Activity Tolerance Patient tolerated treatment well    Behavior During Therapy [facility] for tasks assessed/performed           Past Medical History:  Diagnosis Date  . Anemia    on meds  . Arthritis    hips/wrists/back  . Cataract    sx-bilateral  . Chronic kidney disease    stage 3  . Diabetes mellitus without complication ([place])    on meds  . Hypertension    on meds  . Iron deficiency    on oral iron supplements, has not required transfusion  . Mixed hyperlipidemia    on meds  . OSA on CPAP    compliant  . Sleep apnea    uses CPAP  . Thyroid nodule    removed-not on meds at this time (12/01/2020)    Past Surgical History:  Procedure Laterality Date  . ABDOMINAL HYSTERECTOMY  1996  . MASS EXCISION Right 2017   right arm mass removal  . THYROID SURGERY     left side removal benign nodule    There were no vitals filed for this visit.    Subjective Assessment - 03/05/21 1106    Subjective Pt states increased pain in R hip. Has been hurting for about 1 month. States variable pain, increased with increased activity. Has been trying to do some of HEP from previously, but has been painful. Pt states back pain is better, did have epidural injection for that in the winter. Was seen previously in PT for back and hip pain last winter.    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 Yes    Pain Score 6     Pain Location Hip    Pain Orientation Right    Pain Descriptors / Indicators Aching    Pain Type Chronic pain    Pain Onset More than a month ago    Pain Frequency Intermittent    Aggravating Factors  standing, walking. increased activity              [facility] PT Assessment - 03/05/21 0001      Assessment   Medical Diagnosis R hip pain    Referring Provider (PT) Lynne Leader    Prior Therapy last year, for hip and back      Precautions   Precautions None      Balance Screen   Has the patient fallen in the past 6 months No      Prior Function   Level of Independence Independent      Cognition   Overall Cognitive Status Within Functional Limits for tasks assessed      AROM   Overall AROM Comments hips:  WFL,      Strength   Overall Strength Comments R hip: 4-/5, L: 4/5 , KNees : 4/5      Palpation   Palpation comment Pain in R gr trochanter      Special Tests   Other special tests Increased pain with FADIR,      Ambulation/Gait   Gait Comments decreased hip flexion with swing, increased lateral trunk sway,                      Objective measurements completed on examination: See above findings.       [facility] Adult PT Treatment/Exercise - 03/05/21 0001      Lumbar Exercises: Stretches   Single Knee to Chest Stretch 3 reps;30 seconds      Lumbar Exercises: Supine   Clam 20 reps    Clam Limitations GTB with TA    Straight Leg Raise 10 reps      Lumbar Exercises: Sidelying   Hip Abduction 10 reps      Manual Therapy   Soft tissue mobilization STM/roller to R hip, gr troh and ITB.                  PT Education - 03/05/21 1204    Education Details PT POC, Exam findings, HEP.    Person(s) Educated Patient    Methods Explanation;Demonstration;Tactile cues;Verbal cues;Handout    Comprehension Verbalized  understanding;Returned demonstration;Verbal cues required;Tactile cues required;Need further instruction            PT Short Term Goals - 03/05/21 1210      PT SHORT TERM GOAL #1   Title Pt to be independent with initial HEP    Time 2    Period Weeks    Status New    Target Date 03/19/21             PT Long Term Goals - 03/05/21 1112      PT LONG TERM GOAL #1   Title Pt to be independent with final HEP    Time 6    Period Weeks    Status Achieved    Target Date 04/16/21      PT LONG TERM GOAL #2   Title Pt to report decreased pain in R hip and back to 0-2/10 with standing and walking activity    Time 6    Period Weeks    Status New    Target Date 04/16/21      PT LONG TERM GOAL #3   Title Pt to demo improved hip strength in bil hips,  to at least 4+/5 to improve stability, gait, and pain    Time 6    Period Weeks    Status New    Target Date 04/16/21      PT LONG TERM GOAL #4   Title Pt to demo improved gait mechanics, to show improved hip flexion with swing, and decreased lateral trunk sway.    Time 6    Period Weeks    Status New    Target Date 04/16/21                  Plan - 03/05/21 1204    Clinical Impression Statement Pt presents with primary complaint of increased pain in R hip. Pt with most pain in greater trochanter region today, with testing and palpation, consistent with bursitis. She has + findings for OA in hip as well. Pt with decreased strength of hips, and  will benefit from getting back on track with HEP. Pt with postural and gait deficits, that are likley adding to pain as well. Pt to benefit form skilled PT to improve deficits and pain.    Personal Factors and Comorbidities Time since onset of injury/illness/exacerbation    Examination-Activity Limitations Locomotion Level;Stairs;Stand;Lift;Sit    Examination-Participation Restrictions Cleaning;Community Activity;Shop;Yard Work;Meal Prep    Stability/Clinical Decision Making  Stable/Uncomplicated    Clinical Decision Making Low    Rehab Potential Good    PT Frequency 2x / week    PT Duration 6 weeks    PT Treatment/Interventions ADLs/Self Care Home Management;Electrical Stimulation;Cryotherapy;Iontophoresis 4mg /ml Dexamethasone;Moist Heat;Traction;DME Instruction;Gait training;Neuromuscular re-education;Balance training;Therapeutic exercise;Functional mobility training;Stair training;Patient/family education;Manual techniques;Taping;Dry needling;Passive range of motion;Spinal Manipulations;Joint Manipulations;Ultrasound;Vasopneumatic Device    PT Home Exercise Plan [email or web address]    Consulted and Agree with Plan of Care Patient           Patient will benefit from skilled therapeutic intervention in order to improve the following deficits and impairments:  Abnormal gait,Decreased range of motion,Difficulty walking,Increased muscle spasms,Decreased activity tolerance,Pain,Impaired flexibility,Improper body mechanics,Decreased mobility,Decreased strength,Postural dysfunction,Decreased balance  Visit Diagnosis: Pain in right hip     Problem List Patient Active Problem List   Diagnosis Date Noted  . Type 2 diabetes mellitus with hyperglycemia, without long-term current use of insulin ([place]) 09/11/2020  . Type 2 diabetes mellitus with diabetic polyneuropathy, without long-term current use of insulin ([place]) 09/11/2020  . Type 2 diabetes mellitus with stage 3a chronic kidney disease, without long-term current use of insulin ([place]) 09/11/2020  . Type 2 diabetes mellitus with hyperglycemia, with long-term current use of insulin ([place]) 06/06/2020  . Memory deficits 03/16/2020  . Diabetes mellitus without complication ([place]) 82/80/0349  . OSA on CPAP   . Chronic kidney disease   . Hypertension   . Mixed hyperlipidemia     [name], PT, DPT 12:16 PM  03/05/21    [place], [place],  [address] Phone: [phone]   Fax:  [phone]  Name: Dana Debo MRN: [id number] Date of Birth: 1946-05-14

## 2021-03-08 ENCOUNTER — Other Ambulatory Visit: Payer: Self-pay

## 2021-03-08 ENCOUNTER — Encounter: Payer: Self-pay | Admitting: Physical Therapy

## 2021-03-08 ENCOUNTER — Ambulatory Visit (INDEPENDENT_AMBULATORY_CARE_PROVIDER_SITE_OTHER): Payer: Medicare Other | Admitting: Physical Therapy

## 2021-03-08 DIAGNOSIS — M25551 Pain in right hip: Secondary | ICD-10-CM | POA: Diagnosis not present

## 2021-03-08 NOTE — Therapy (Signed)
[place], [place], [address] Phone: [phone]   Fax:  [phone]  Physical Therapy Treatment  Patient Details  Name: Catherine Mcconnell MRN: [id number] Date of Birth: Nov 12, 1945 Referring Provider (PT): Lynne Leader   Encounter Date: 03/08/2021   PT End of Session - 03/08/21 1027    Visit Number 2    Number of Visits 12    Date for PT Re-Evaluation 04/16/21    Authorization Type UHC    PT Start Time 1020    PT Stop Time 1102    PT Time Calculation (min) 42 min    Activity Tolerance Patient tolerated treatment well    Behavior During Therapy [facility] for tasks assessed/performed           Past Medical History:  Diagnosis Date  . Anemia    on meds  . Arthritis    hips/wrists/back  . Cataract    sx-bilateral  . Chronic kidney disease    stage 3  . Diabetes mellitus without complication ([place])    on meds  . Hypertension    on meds  . Iron deficiency    on oral iron supplements, has not required transfusion  . Mixed hyperlipidemia    on meds  . OSA on CPAP    compliant  . Sleep apnea    uses CPAP  . Thyroid nodule    removed-not on meds at this time (12/01/2020)    Past Surgical History:  Procedure Laterality Date  . ABDOMINAL HYSTERECTOMY  1996  . MASS EXCISION Right 2017   right arm mass removal  . THYROID SURGERY     left side removal benign nodule    There were no vitals filed for this visit.   Subjective Assessment - 03/08/21 1025    Subjective Pt states continued pain in hip. Variable pain, depending on the day.    Currently in Pain? Yes    Pain Score 6     Pain Location Hip    Pain Orientation Right    Pain Descriptors / Indicators Aching    Pain Type Chronic pain    Pain Onset More than a month ago    Pain Frequency Intermittent              [facility] PT Assessment - 03/08/21 0001      Strength   Overall Strength Comments R ankle: DF: 4+/5 to 5/5,  INV, /Ev: 4+/5 to 5/5                          [facility] Adult PT Treatment/Exercise - 03/08/21 0001      Ambulation/Gait   Gait Comments decreased hip flexion with swing, increased lateral trunk sway,      Exercises   Exercises Lumbar      Lumbar Exercises: Stretches   Single Knee to Chest Stretch 3 reps;30 seconds    Single Knee to Chest Stretch Limitations with towel    Pelvic Tilt 15 reps    Piriformis Stretch 3 reps;30 seconds;Right    Piriformis Stretch Limitations supine IR across body      Lumbar Exercises: Aerobic   Recumbent Bike L2 x 6 min;      Lumbar Exercises: Seated   Sit to Stand 10 reps      Lumbar Exercises: Supine   Clam 20 reps    Clam Limitations GTB with TA    Bent Knee Raise 20 reps  Bent Knee Raise Limitations GTB    Straight Leg Raise 10 reps    Straight Leg Raises Limitations bil      Lumbar Exercises: Sidelying   Hip Abduction 10 reps;Both      Manual Therapy   Manual Therapy Joint mobilization;Manual Traction    Soft tissue mobilization STM/IASTM to R gr trochanter, glute, and ITB.    Manual Traction long leg distraction on R for hip pump x 2 min                    PT Short Term Goals - 03/05/21 1210      PT SHORT TERM GOAL #1   Title Pt to be independent with initial HEP    Time 2    Period Weeks    Status New    Target Date 03/19/21             PT Long Term Goals - 03/05/21 1112      PT LONG TERM GOAL #1   Title Pt to be independent with final HEP    Time 6    Period Weeks    Status Achieved    Target Date 04/16/21      PT LONG TERM GOAL #2   Title Pt to report decreased pain in R hip and back to 0-2/10 with standing and walking activity    Time 6    Period Weeks    Status New    Target Date 04/16/21      PT LONG TERM GOAL #3   Title Pt to demo improved hip strength in bil hips,  to at least 4+/5 to improve stability, gait, and pain    Time 6    Period Weeks    Status New    Target Date 04/16/21      PT LONG  TERM GOAL #4   Title Pt to demo improved gait mechanics, to show improved hip flexion with swing, and decreased lateral trunk sway.    Time 6    Period Weeks    Status New    Target Date 04/16/21                 Plan - 03/08/21 1120    Clinical Impression Statement Pt with most soreness in greater trochanter region today, addressed with DTM and IASTM. Focus on ther ex today for strengthening for hips, pt to benefit from continued work on this. Plan to progress as tolerated.    Personal Factors and Comorbidities Time since onset of injury/illness/exacerbation    Examination-Activity Limitations Locomotion Level;Stairs;Stand;Lift;Sit    Examination-Participation Restrictions Cleaning;Community Activity;Shop;Yard Work;Meal Prep    Stability/Clinical Decision Making Stable/Uncomplicated    Rehab Potential Good    PT Frequency 2x / week    PT Duration 6 weeks    PT Treatment/Interventions ADLs/Self Care Home Management;Electrical Stimulation;Cryotherapy;Iontophoresis 4mg /ml Dexamethasone;Moist Heat;Traction;DME Instruction;Gait training;Neuromuscular re-education;Balance training;Therapeutic exercise;Functional mobility training;Stair training;Patient/family education;Manual techniques;Taping;Dry needling;Passive range of motion;Spinal Manipulations;Joint Manipulations;Ultrasound;Vasopneumatic Device    PT Home Exercise Plan [email or web address]    Consulted and Agree with Plan of Care Patient           Patient will benefit from skilled therapeutic intervention in order to improve the following deficits and impairments:  Abnormal gait,Decreased range of motion,Difficulty walking,Increased muscle spasms,Decreased activity tolerance,Pain,Impaired flexibility,Improper body mechanics,Decreased mobility,Decreased strength,Postural dysfunction,Decreased balance  Visit Diagnosis: Pain in right hip     Problem List Patient Active Problem List   Diagnosis Date Noted  . Type  2 diabetes mellitus with  hyperglycemia, without long-term current use of insulin ([place]) 09/11/2020  . Type 2 diabetes mellitus with diabetic polyneuropathy, without long-term current use of insulin ([place]) 09/11/2020  . Type 2 diabetes mellitus with stage 3a chronic kidney disease, without long-term current use of insulin ([place]) 09/11/2020  . Type 2 diabetes mellitus with hyperglycemia, with long-term current use of insulin ([place]) 06/06/2020  . Memory deficits 03/16/2020  . Diabetes mellitus without complication ([place]) 08/81/1031  . OSA on CPAP   . Chronic kidney disease   . Hypertension   . Mixed hyperlipidemia     [name], PT, DPT 11:23 AM  03/08/21    [place], [place], [address] Phone: [phone]   Fax:  [phone]  Name: Catherine Mcconnell MRN: [id number] Date of Birth: January 08, 1946

## 2021-03-16 ENCOUNTER — Encounter: Payer: Medicare Other | Admitting: Physical Therapy

## 2021-03-22 ENCOUNTER — Encounter: Payer: Self-pay | Admitting: Physical Therapy

## 2021-03-22 ENCOUNTER — Ambulatory Visit: Payer: Medicare Other | Admitting: Physical Therapy

## 2021-03-22 ENCOUNTER — Other Ambulatory Visit: Payer: Self-pay

## 2021-03-22 DIAGNOSIS — M25551 Pain in right hip: Secondary | ICD-10-CM | POA: Diagnosis not present

## 2021-03-23 ENCOUNTER — Other Ambulatory Visit: Payer: Self-pay | Admitting: Physician Assistant

## 2021-03-23 DIAGNOSIS — I10 Essential (primary) hypertension: Secondary | ICD-10-CM

## 2021-03-25 ENCOUNTER — Encounter: Payer: Self-pay | Admitting: Physical Therapy

## 2021-03-25 NOTE — Therapy (Signed)
[facility] [address] [place], [place], [address] Phone: [phone]   Fax:  [phone]  Physical Therapy Treatment  Patient Details  Name: Catherine Mcconnell MRN: [id number] Date of Birth: 25-Nov-1945 Referring Provider (PT): Lynne Leader   Encounter Date: 03/22/2021   PT End of Session - 03/25/21 0734     Visit Number 3    Number of Visits 12    Date for PT Re-Evaluation 04/16/21    Authorization Type UHC    PT Start Time 4010    PT Stop Time 1515    PT Time Calculation (min) 40 min    Activity Tolerance Patient tolerated treatment well    Behavior During Therapy [facility] for tasks assessed/performed             Past Medical History:  Diagnosis Date   Anemia    on meds   Arthritis    hips/wrists/back   Cataract    sx-bilateral   Chronic kidney disease    stage 3   Diabetes mellitus without complication ([place])    on meds   Hypertension    on meds   Iron deficiency    on oral iron supplements, has not required transfusion   Mixed hyperlipidemia    on meds   OSA on CPAP    compliant   Sleep apnea    uses CPAP   Thyroid nodule    removed-not on meds at this time (12/01/2020)    Past Surgical History:  Procedure Laterality Date   [place] Right 2017   right arm mass removal   THYROID SURGERY     left side removal benign nodule    There were no vitals filed for this visit.   Subjective Assessment - 03/25/21 0734     Subjective Pt states no pain in the last week. She has been able to do some outdoor work raking leaves.    Currently in Pain? No/denies    Pain Score 0-No pain                               [facility] Adult PT Treatment/Exercise - 03/25/21 0001       Ambulation/Gait   Gait Comments improved speed, knee flexion, decreased trunk sway      Exercises   Exercises Lumbar      Lumbar Exercises: Stretches   Piriformis Stretch 3 reps;30  seconds;Right    Piriformis Stretch Limitations supine IR across body      Lumbar Exercises: Aerobic   Recumbent Bike L2 x 9 min;      Lumbar Exercises: Standing   Other Standing Lumbar Exercises Side stepping 20 ft x 4;      Lumbar Exercises: Seated   Sit to Stand 10 reps      Lumbar Exercises: Supine   Clam 20 reps    Clam Limitations GTB with TA    Straight Leg Raise 10 reps    Straight Leg Raises Limitations bil      Lumbar Exercises: Sidelying   Hip Abduction Both;15 reps      Manual Therapy   Manual Therapy Joint mobilization;Manual Traction                      PT Short Term Goals - 03/05/21 1210       PT SHORT TERM GOAL #1   Title Pt  to be independent with initial HEP    Time 2    Period Weeks    Status New    Target Date 03/19/21               PT Long Term Goals - 03/05/21 1112       PT LONG TERM GOAL #1   Title Pt to be independent with final HEP    Time 6    Period Weeks    Status Achieved    Target Date 04/16/21      PT LONG TERM GOAL #2   Title Pt to report decreased pain in R hip and back to 0-2/10 with standing and walking activity    Time 6    Period Weeks    Status New    Target Date 04/16/21      PT LONG TERM GOAL #3   Title Pt to demo improved hip strength in bil hips,  to at least 4+/5 to improve stability, gait, and pain    Time 6    Period Weeks    Status New    Target Date 04/16/21      PT LONG TERM GOAL #4   Title Pt to demo improved gait mechanics, to show improved hip flexion with swing, and decreased lateral trunk sway.    Time 6    Period Weeks    Status New    Target Date 04/16/21                   Plan - 03/25/21 0736     Clinical Impression Statement Pt with improved pain levels. She has noted improvements in strength in hips seen with improved abilty for ther ex today. Noted improvement in  gait mechanics and speed as well. Pt progressing well, plan to see again 1x next week,likely  progression to d/c if pt still doing well.    Personal Factors and Comorbidities Time since onset of injury/illness/exacerbation    Examination-Activity Limitations Locomotion Level;Stairs;Stand;Lift;Sit    Examination-Participation Restrictions Cleaning;Community Activity;Shop;Yard Work;Meal Prep    Stability/Clinical Decision Making Stable/Uncomplicated    Rehab Potential Good    PT Frequency 2x / week    PT Duration 6 weeks    PT Treatment/Interventions ADLs/Self Care Home Management;Electrical Stimulation;Cryotherapy;Iontophoresis 4mg /ml Dexamethasone;Moist Heat;Traction;DME Instruction;Gait training;Neuromuscular re-education;Balance training;Therapeutic exercise;Functional mobility training;Stair training;Patient/family education;Manual techniques;Taping;Dry needling;Passive range of motion;Spinal Manipulations;Joint Manipulations;Ultrasound;Vasopneumatic Device    PT Home Exercise Plan [email or web address]    Consulted and Agree with Plan of Care Patient             Patient will benefit from skilled therapeutic intervention in order to improve the following deficits and impairments:  Abnormal gait, Decreased range of motion, Difficulty walking, Increased muscle spasms, Decreased activity tolerance, Pain, Impaired flexibility, Improper body mechanics, Decreased mobility, Decreased strength, Postural dysfunction, Decreased balance  Visit Diagnosis: Pain in right hip     Problem List Patient Active Problem List   Diagnosis Date Noted   Type 2 diabetes mellitus with hyperglycemia, without long-term current use of insulin ([place]) 09/11/2020   Type 2 diabetes mellitus with diabetic polyneuropathy, without long-term current use of insulin ([place]) 09/11/2020   Type 2 diabetes mellitus with stage 3a chronic kidney disease, without long-term current use of insulin ([place]) 09/11/2020   Type 2 diabetes mellitus with hyperglycemia, with long-term current use of insulin ([place]) 06/06/2020   Memory deficits  03/16/2020   Diabetes mellitus without complication ([place]) 99/35/7017   OSA on CPAP  Chronic kidney disease    Hypertension    Mixed hyperlipidemia      [name], PT, DPT 7:48 AM  03/25/21   [facility] [place], [place], [address] Phone: [phone]   Fax:  [phone]  Name: Catherine Mcconnell MRN: [id number] Date of Birth: Jul 23, 1946

## 2021-03-28 ENCOUNTER — Ambulatory Visit: Payer: Medicare Other | Admitting: Physical Therapy

## 2021-03-28 ENCOUNTER — Encounter: Payer: Self-pay | Admitting: Physical Therapy

## 2021-03-28 ENCOUNTER — Other Ambulatory Visit: Payer: Self-pay

## 2021-03-28 DIAGNOSIS — M25551 Pain in right hip: Secondary | ICD-10-CM

## 2021-03-28 NOTE — Therapy (Signed)
[facility] [address] [place], [place], [address] Phone: [phone]   Fax:  [phone]  Physical Therapy Treatment/Discharge   Patient Details  Name: Catherine Mcconnell MRN: [id number] Date of Birth: 09/19/1946 Referring Provider (PT): Lynne Leader   Encounter Date: 03/28/2021   PT End of Session - 03/28/21 1211     Visit Number 4    Number of Visits 12    Date for PT Re-Evaluation 04/16/21    Authorization Type UHC    PT Start Time 1209    PT Stop Time 1248    PT Time Calculation (min) 39 min    Activity Tolerance Patient tolerated treatment well    Behavior During Therapy [facility] for tasks assessed/performed             Past Medical History:  Diagnosis Date   Anemia    on meds   Arthritis    hips/wrists/back   Cataract    sx-bilateral   Chronic kidney disease    stage 3   Diabetes mellitus without complication ([place])    on meds   Hypertension    on meds   Iron deficiency    on oral iron supplements, has not required transfusion   Mixed hyperlipidemia    on meds   OSA on CPAP    compliant   Sleep apnea    uses CPAP   Thyroid nodule    removed-not on meds at this time (12/01/2020)    Past Surgical History:  Procedure Laterality Date   [place] Right 2017   right arm mass removal   THYROID SURGERY     left side removal benign nodule    There were no vitals filed for this visit.   Subjective Assessment - 03/28/21 1211     Subjective Pt stats no pain, has been feeling good.    Currently in Pain? No/denies    Pain Score 0-No pain                               [facility] Adult PT Treatment/Exercise - 03/28/21 0001       Ambulation/Gait   Gait Comments --      Exercises   Exercises Lumbar      Lumbar Exercises: Stretches   Single Knee to Chest Stretch 2 reps;30 seconds    Piriformis Stretch 3 reps;30 seconds;Right    Piriformis Stretch  Limitations seated      Lumbar Exercises: Aerobic   Recumbent Bike L2 x 9 min;      Lumbar Exercises: Standing   Other Standing Lumbar Exercises Side stepping 20 ft x 4;    Other Standing Lumbar Exercises step ups x 10 bil; 6 in, no UE support;      Lumbar Exercises: Seated   Sit to Stand 10 reps      Lumbar Exercises: Supine   Clam 20 reps    Clam Limitations GTB with TA    Bridge with clamshell 20 reps    Straight Leg Raise 20 reps    Straight Leg Raises Limitations bil      Lumbar Exercises: Sidelying   Hip Abduction Both;20 reps      Manual Therapy   Manual Therapy Joint mobilization;Manual Traction                    PT Education - 03/28/21 1211  Education Details Final HEP reviewed    Person(s) Educated Patient    Methods Explanation;Demonstration;Tactile cues;Verbal cues;Handout    Comprehension Verbalized understanding;Returned demonstration;Verbal cues required;Tactile cues required              PT Short Term Goals - 03/28/21 1212       PT SHORT TERM GOAL #1   Title Pt to be independent with initial HEP    Time 2    Period Weeks    Status Achieved    Target Date 03/19/21               PT Long Term Goals - 03/28/21 1212       PT LONG TERM GOAL #1   Title Pt to be independent with final HEP    Time 6    Period Weeks    Status Achieved      PT LONG TERM GOAL #2   Title Pt to report decreased pain in R hip and back to 0-2/10 with standing and walking activity    Time 6    Period Weeks    Status Achieved      PT LONG TERM GOAL #3   Title Pt to demo improved hip strength in bil hips,  to at least 4+/5 to improve stability, gait, and pain    Time 6    Period Weeks    Status Achieved      PT LONG TERM GOAL #4   Title Pt to demo improved gait mechanics, to show improved hip flexion with swing, and decreased lateral trunk sway.    Time 6    Period Weeks    Status Achieved                   Plan - 03/28/21 1234      Clinical Impression Statement Pt has made good progress. She has been pain free, and has met goals at this time. She has improved strength of hips, but will beneift from continuation of HEP for continued strengthening. Final HEP reviewed in detail today. Plan to d/c at this time, pt in agreement with plan.    Personal Factors and Comorbidities Time since onset of injury/illness/exacerbation    Examination-Activity Limitations Locomotion Level;Stairs;Stand;Lift;Sit    Examination-Participation Restrictions Cleaning;Community Activity;Shop;Yard Work;Meal Prep    Stability/Clinical Decision Making Stable/Uncomplicated    Rehab Potential Good    PT Frequency 2x / week    PT Duration 6 weeks    PT Treatment/Interventions ADLs/Self Care Home Management;Electrical Stimulation;Cryotherapy;Iontophoresis 74m/ml Dexamethasone;Moist Heat;Traction;DME Instruction;Gait training;Neuromuscular re-education;Balance training;Therapeutic exercise;Functional mobility training;Stair training;Patient/family education;Manual techniques;Taping;Dry needling;Passive range of motion;Spinal Manipulations;Joint Manipulations;Ultrasound;Vasopneumatic Device    PT Home Exercise Plan [email or web address]    Consulted and Agree with Plan of Care Patient             Patient will benefit from skilled therapeutic intervention in order to improve the following deficits and impairments:  Abnormal gait, Decreased range of motion, Difficulty walking, Increased muscle spasms, Decreased activity tolerance, Pain, Impaired flexibility, Improper body mechanics, Decreased mobility, Decreased strength, Postural dysfunction, Decreased balance  Visit Diagnosis: Pain in right hip     Problem List Patient Active Problem List   Diagnosis Date Noted   Type 2 diabetes mellitus with hyperglycemia, without long-term current use of insulin ([place] 09/11/2020   Type 2 diabetes mellitus with diabetic polyneuropathy, without long-term current use of  insulin ([place] 09/11/2020   Type 2 diabetes mellitus with stage 3a chronic kidney disease, without long-term current  use of insulin (H[facility]) 09/11/2020   Type 2 diabetes mellitus with hyperglycemia, with long-term current use of insulin (HCC) 06/06/2020   Memory deficits 03/16/2020   Diabetes mellitus without complication (H[facility]) 11/13/2018   OSA on CPAP    Chronic kidney disease    Hypertension    Mixed hyperlipidemia      , PT, DPT 12:52 PM  03/28/21   [facility] [address] [place], [place], [address] Phone: [phone]   Fax:  [phone]  Name: Analeise Pearl Reichard MRN: [id number] Date of Birth: 02/12/1946  PHYSICAL THERAPY DISCHARGE SUMMARY  Visits from Start of Care: 4   Plan: Patient agrees to discharge.  Patient goals were met. Patient is being discharged due to meeting the stated rehab goals.      , PT, DPT 12:52 PM  03/28/21      

## 2021-03-28 NOTE — Patient Instructions (Signed)
Access Code: [email or web address] URL: [email or web address] Date: 03/28/2021 Prepared by: [name]  Exercises Supine Single Knee to Chest Stretch - 2 x daily - 3 reps - 30 hold Supine Figure 4 Piriformis Stretch with Leg Extension - 2 x daily - 3 reps - 30 hold Seated Piriformis Stretch with Trunk Bend - 2 x daily - 3 reps - 30 hold Sit to Stand - 1 x daily - 1 sets - 10 reps Sidelying Hip Abduction - 1 x daily - 2 sets - 10 reps Hooklying Clamshell with Resistance - 1 x daily - 2 sets - 10 reps Supine Bridge - 1 x daily - 2 sets - 10 reps Straight Leg Raise - 1 x daily - 1-2 sets - 10 reps

## 2021-04-12 ENCOUNTER — Ambulatory Visit: Payer: Medicare Other | Admitting: Adult Health

## 2021-04-12 ENCOUNTER — Encounter: Payer: Self-pay | Admitting: Adult Health

## 2021-04-12 VITALS — BP 175/78 | HR 68 | Ht 66.0 in | Wt 162.0 lb

## 2021-04-12 DIAGNOSIS — Z9989 Dependence on other enabling machines and devices: Secondary | ICD-10-CM

## 2021-04-12 DIAGNOSIS — R413 Other amnesia: Secondary | ICD-10-CM | POA: Diagnosis not present

## 2021-04-12 DIAGNOSIS — G4733 Obstructive sleep apnea (adult) (pediatric): Secondary | ICD-10-CM

## 2021-04-12 MED ORDER — DONEPEZIL HCL 10 MG PO TBDP: 10.0000 mg | ORAL_TABLET | Freq: Every day | ORAL | 3 refills | Status: DC

## 2021-04-12 NOTE — Patient Instructions (Signed)
Your Plan:  Increase aricept 10 mg at bedtime- will hold sending script until you tell us to. If your symptoms worsen or you develop new symptoms please let us know.   Thank you for coming to see [place] at [facility]. I hope we have been able to provide you high quality care today.  You may receive a patient satisfaction survey over the next few weeks. We would appreciate your feedback and comments so that we may continue to improve ourselves and the health of our patients.

## 2021-04-12 NOTE — Progress Notes (Signed)
PATIENT: Catherine Mcconnell DOB: 11-22-1945  REASON FOR VISIT: follow up HISTORY FROM: patient Primary neurologist: Dr. Brett Fairy  HISTORY OF PRESENT ILLNESS: Today 04/12/21:  Catherine Mcconnell is a 75 year old female with a history of obstructive sleep apnea on CPAP and memory disturbance.  She returns today for follow-up.  She reports that the CPAP works well.  She denies any new issues.  She feels that her memory has remained stable.  She is able to complete all ADLs independently.  She lives with her daughter.  She manages her own medications and appointments.  She is able to operate a motor vehicle.  She currently takes Aricept 5 mg at bedtime.    01/02/21: Catherine Mcconnell  is a 75 year old female with a history of obstructive sleep apnea and memory disturbance.  She returns today for follow-up.  She reports that her memory has been relatively stable.  She does not feel that it is gotten any worse.  She lives with her daughter.  Her daughter was unable to attend this visit.  She states that on occasion she feels brain fog but this is inconsistent versus consistent.  She is able to complete all ADLs independently.  She does operate a Teacher, music.  Denies any trouble driving but she reports that she does use a GPS consistently to be safe.  She denies any troubles managing her finances.  Reports that she continues to cook meals without difficulty.  She is currently on no medication for her memory.  She continues to use the CPAP consistently.  HISTORY (Copied from Dr.Dohmeier's note) New problem for this established sleep patient, last seen by NP- Televisit.  Catherine Mcconnell is a 75 year- old african -[place] . female is seen on 03-16-2020 with her daughter. According to her daughter Catherine Mcconnell has forgotten to pay her phone bill which has never happened before, she has gotten lost driving in town on familiar roads.  There were no aggravating factors such as bad weather or nighttime driving.  These events  have left Catherine Mcconnell a little bit shaken. Based on these concerns her physician assistant [name] has sent the patient for follow-up.     The memory loss is accompanied by a rather poorly controlled diabetes still the HbA1c was 8.4 in April of this year, she has a past medical history of chronic kidney disease which is not graded, arthritis, heart iron deficiency, hypertension, mixed hyperlipidemia and she has a history of a thyroid nodule which was surgically removed. She has a past surgical history of has to hysterectomy in 1996 thyroid surgery a biopsy revealed a benign nodule.  And she had a skin mass removed from her right upper arm in 2017 which  was not cancerous.    She is followed here in our sleep clinic for OSA obstructive sleep apnea on CPAP.  Just to stay for another visit she has been 100% compliant by days, 97% compliant by time average use at time of 6 hours 51 minutes, CPAP is set to 11 cm water pressure with 3 cm EPR her residual apnea-hypopnea index is 2.4/h very good she does have some central apneas arising but overall the main problem may be an air leak but not a lack of control of apnea.  95th percentile air leak was 55 L/min which means that the mask gets dislodged sometimes.   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      REVIEW OF SYSTEMS: Out of a complete 14 system review of symptoms, the patient complains only of the following symptoms, and all other reviewed systems are negative.  ESS 5   ALLERGIES: Allergies  Allergen Reactions   Lisinopril Rash   Metformin And Related Swelling    HOME MEDICATIONS: Outpatient Medications Prior to Visit  Medication Sig Dispense Refill   acetaminophen-codeine (TYLENOL #3) 300-30 MG tablet Take 2 tablets by mouth every 8 (eight) hours as needed for severe  pain. 30 tablet 0   amLODipine (NORVASC) 10 MG tablet Take 1 tablet by mouth every day 30 tablet 11   aspirin EC 81 MG tablet Take 81 mg by mouth daily.     atorvastatin (LIPITOR) 40 MG tablet TAKE 1 TABLET BY MOUTH  DAILY 90 tablet 3   carvedilol (COREG) 6.25 MG tablet TAKE 1 TABLET(6.25 MG) BY MOUTH TWICE DAILY WITH A MEAL 180 tablet 0   donepezil (ARICEPT ODT) 5 MG disintegrating tablet Take 1 tablet (5 mg total) by mouth at bedtime. 10 tablet 0   ferrous sulfate (IRON SUPPLEMENT) 325 (65 FE) MG tablet Take 1 tablet (325 mg total) by mouth 2 (two) times daily with a meal. 60 tablet 2   furosemide (LASIX) 20 MG tablet Take 1 tablet (20 mg total) by mouth daily as needed for edema. 30 tablet 1   gabapentin (NEURONTIN) 100 MG capsule Take 3 capsules by mouth every day 90 capsule 3   insulin degludec (TRESIBA FLEXTOUCH) 100 UNIT/ML FlexTouch Pen Inject 8 Units into the skin daily. 15 mL 3   Insulin Pen Needle 32G X 4 MM MISC 1 each by Does not apply route as needed. 100 each 3   ketoconazole (NIZORAL) 2 % cream Apply 1 fingertip amount to each foot daily. (Patient taking differently: Apply 1 application topically daily as needed. Apply 1 fingertip amount to each foot daily.) 30 g 0   Lancets (ONETOUCH ULTRASOFT) lancets CHECK TWICE PER DAY BEFORE  BREAKFAST AND DINNER 200 each 4   losartan (COZAAR) 25 MG tablet Take 1 tablet by mouth every day 30 tablet 11   losartan (COZAAR) 50 MG tablet Take 50 mg by mouth daily.     LUMIGAN 0.01 % SOLN Place 1 drop into both eyes every evening.     ONETOUCH ULTRA test strip USE TO TEST BLOOD SUGARS  TWICE DAILY 200 strip 3   Semaglutide (RYBELSUS) 14 MG TABS Take 14 mg by mouth daily. 90 tablet 2   donepezil (ARICEPT ODT) 5 MG disintegrating tablet Take 1 tablet (5 mg total) by mouth at bedtime. 90 tablet 1   No facility-administered medications prior to visit.    PAST MEDICAL HISTORY: Past Medical History:  Diagnosis Date   Anemia    on meds    Arthritis    hips/wrists/back   Cataract    sx-bilateral   Chronic kidney disease    stage 3   Diabetes mellitus without complication ([place])    on meds   Hypertension    on meds   Iron deficiency    on oral iron supplements, has not required transfusion   Mixed hyperlipidemia    on meds   OSA on CPAP    compliant   Sleep apnea    uses CPAP   Thyroid nodule    removed-not on meds at this time (12/01/2020)  PAST SURGICAL HISTORY: Past Surgical History:  Procedure Laterality Date   ABDOMINAL HYSTERECTOMY  1996   M[place] EXC[place] Right 2017   right arm mass removal   THYROID SURGERY     left side removal benign nodule    FAMILY HISTORY: Family History  Problem Relation Age of Onset   Stroke Mother    Hypertension Mother    Heart disease Mother    Heart attack Father    Early death Father    Hypertension Father    Diabetes Sister    Early death Sister    Hypertension Sister    Stroke Sister    Prostate cancer Son    Cancer Son    Early death Son    Breast cancer Cousin    Diabetes Sister    Early death Sister    Hyperlipidemia Sister    Kidney disease Sister    Depression Daughter    Hypertension Daughter    Hyperlipidemia Daughter    Diabetes Sister    Hyperlipidemia Sister    Hypertension Sister    Early death Brother    Arthritis Brother    Heart attack Brother    Heart disease Brother    Arthritis Brother    Diabetes Sister    Colon polyps Neg Hx    Colon cancer Neg Hx    Esophageal cancer Neg Hx    Rectal cancer Neg Hx    Stomach cancer Neg Hx     SOCIAL HISTORY: Social History   Socioeconomic History   Marital status: Divorced    Spouse name: Not on file   Number of children: 6   Years of education: some college   Highest education level: Not on file  Occupational History   Occupation: Retired   Tobacco Use   Smoking status: Former    Pack years: 0.00   Smokeless tobacco: Never   Tobacco comments:    only socially in high school   Vaping Use   Vaping Use: Never used  Substance and Sexual Activity   Alcohol use: Not Currently   Drug use: Never   Sexual activity: Not on file  Other Topics Concern   Not on file  Social History Narrative   03/16/20 lives with dgtr Weber Cooks   Mother of 6   --Two children have passed, one from from prostate cancer and one from liver failure   Used to be a Quarry manager, Child psychotherapist   Recently moved back to [place] Determinants of Radio broadcast assistant Strain: Low Risk    Difficulty of Paying Living Expenses: Not hard at all  Food Insecurity: No Food Insecurity   Worried About Charity fundraiser in the Last Year: Never true   Arboriculturist in the Last Year: Never true  Transportation Needs: No Transportation Needs   Lack of Transportation (Medical): No   Lack of Transportation (Non-Medical): No  Physical Activity: Insufficiently Active   Days of Exercise per Week: 5 days   Minutes of Exercise per Session: 20 min  Stress: No Stress Concern Present   Feeling of Stress : Not at all  Social Connections: Socially Isolated   Frequency of Communication with Friends and Family: Never   Frequency of Social Gatherings with Friends and Family: Never   Attends Religious Services: More than 4 times per year   Active Member of [employer] or Organizations: No   Attends Archivist Meetings: Never   Marital Status: Divorced  Intimate  Partner Violence: Not At Risk   Fear of Current or Ex-Partner: No   Emotionally Abused: No   Physically Abused: No   Sexually Abused: No      PHYSICAL EXAM  Vitals:   04/12/21 1421  BP: (!) 175/78  Pulse: 68  Weight: 162 lb (73.5 kg)  Height: 5\' 6"  (1.676 m)   Body mass index is 26.15 kg/m.   MMSE - Mini Mental State Exam 04/12/2021 02/11/2020 02/11/2020  Orientation to time 5 5 5   Orientation to Place 5 5 5   Registration 3 - -  Attention/ Calculation 2 5 5   Recall 3 - 1  Language- name 2 objects 2 - -  Language- repeat 1 - -   Language- follow 3 step command 3 3 -  Language- read & follow direction 1 - -  Write a sentence 1 1 1   Copy design 1 - -  Total score 27 - -     Montreal Cognitive Assessment  01/02/2021 07/17/2020 03/16/2020  Visuospatial/ Executive (0/5) 5 5 4   Naming (0/3) 3 3 3   Attention: Read list of digits (0/2) 0 2 1  Attention: Read list of letters (0/1) 1 1 1   Attention: Serial 7 subtraction starting at 100 (0/3) 1 2 3   Language: Repeat phrase (0/2) 2 2 2   Language : Fluency (0/1) 1 0 1  Abstraction (0/2) 2 2 2   Delayed Recall (0/5) 1 2 1   Orientation (0/6) 6 6 6   Total 22 25 24   Adjusted Score (based on education) - 25 -    Generalized: Well developed, in no acute distress   Neurological examination  Mentation: Alert oriented to time, place, history taking. Follows all commands speech and language fluent Cranial nerve II-XII: Pupils were equal round reactive to light. Extraocular movements were full, visual field were full on confrontational test.  Head turning and shoulder shrug  were normal and symmetric. Motor: The motor testing reveals 5 over 5 strength of all 4 extremities. Good symmetric motor tone is noted throughout.  Sensory: Sensory testing is intact to soft touch on all 4 extremities. No evidence of extinction is noted.  Coordination: Cerebellar testing reveals good finger-nose-finger and heel-to-shin bilaterally.  Gait and station: Gait is normal.  Reflexes: Deep tendon reflexes are symmetric and normal bilaterally.   DIAGNOSTIC DATA (LABS, IMAGING, TESTING) - I reviewed patient records, labs, notes, testing and imaging myself where available.  Lab Results  Component Value Date   WBC 2.6 (L) 06/21/2020   HGB 11.7 06/21/2020   HCT 35.0 06/21/2020   MCV 89.7 06/21/2020   PLT 183 06/21/2020      Component Value Date/Time   NA 136 06/14/2020 1353   NA 136 (A) 03/30/2020 0000   K 4.3 06/14/2020 1353   CL 100 06/14/2020 1353   CO2 28 06/14/2020 1353   GLUCOSE 129  (H) 06/14/2020 1353   BUN 20 06/14/2020 1353   BUN 23 (A) 03/30/2020 0000   CREATININE 1.44 (H) 06/14/2020 1353   CALCIUM 9.4 06/14/2020 1353   PROT 6.8 06/14/2020 1353   ALBUMIN 4.6 08/05/2019 0000   AST 24 06/14/2020 1353   ALT 27 06/14/2020 1353   ALKPHOS 57 08/13/2018 0916   BILITOT 0.5 06/14/2020 1353   GFRNONAA 29 03/30/2020 0000   GFRAA 34 03/30/2020 0000   Lab Results  Component Value Date   CHOL 217 (H) 06/06/2020   HDL 61 06/06/2020   LDLCALC 134 (H) 06/06/2020   TRIG 108 06/06/2020  CHOLHDL 3.6 06/06/2020   Lab Results  Component Value Date   HGBA1C 7.4 (A) 01/11/2021   Lab Results  Component Value Date   YJEHUDJS97 026 02/11/2020   Lab Results  Component Value Date   TSH 3.48 06/14/2020      ASSESSMENT AND PLAN 75 y.o. year old female  has a past medical history of Anemia, Arthritis, Cataract, Chronic kidney disease, Diabetes mellitus without complication ([place]), Hypertension, Iron deficiency, Mixed hyperlipidemia, OSA on CPAP, Sleep apnea, and Thyroid nodule. here with:  1.  Memory disturbance  Memory score MMSE 27/30 continue to monitor Increase Aricept to 10 mg at bedtime Advised patient if her symptoms worsen or she develops new symptoms she should let us know  2.  Sleep apnea on CPAP  Good compliance Good treatment of her apnea Encouraged her to continue using CPAP nightly and greater than 4 hours each night  Follow-up in 6 months or sooner if needed    Ward Givens, MSN, NP-C 04/12/2021, 2:50 PM [facility] [address] [place], [place] [address] [phone]

## 2021-04-13 NOTE — Progress Notes (Signed)
I agree with the assessment and plan as directed by NP on this visit . I was available for consultation.   Larey Seat, MD  PS: I love the snipper tool!

## 2021-04-19 ENCOUNTER — Ambulatory Visit: Payer: Medicare Other | Admitting: Family Medicine

## 2021-04-19 ENCOUNTER — Other Ambulatory Visit: Payer: Self-pay

## 2021-04-19 ENCOUNTER — Ambulatory Visit: Payer: Self-pay

## 2021-04-19 ENCOUNTER — Encounter: Payer: Self-pay | Admitting: Family Medicine

## 2021-04-19 VITALS — BP 140/88 | HR 58 | Ht 66.0 in | Wt 156.0 lb

## 2021-04-19 DIAGNOSIS — M25551 Pain in right hip: Secondary | ICD-10-CM

## 2021-04-19 NOTE — Patient Instructions (Addendum)
Thank you for coming in today.   Continue the home exercises.   Call or go to the ER if you develop a large red swollen joint with extreme pain or oozing puss.    If not better let me know.   MRI of probably the next step. I can order that with a phone call or mychart message.   Cancel the appointment on the 19th.

## 2021-04-19 NOTE — Progress Notes (Signed)
I, Wendy Poet, LAT, ATC, am serving as scribe for Dr. Lynne Leader.  Catherine Mcconnell is a 75 y.o. female who presents to [facility] today for f/u of R hip pain.  She was last seen by Dr. Georgina Snell on 02/27/21 and was referred to PT of which she completed 4 sessions.  Since her last visit, pt reports that her R hip is about the same.  It's been worse over the past few days.  She has a bruise in the area of her GT and some swelling.  She was d/c from PT as her hip was doing better until early this week when the bruise and swelling appeared.  Diagnostic imaging: R hip XR- 06/20/20  Pertinent review of systems: No fevers or chills  Relevant historical information: Hypertension and diabetes   Exam:  BP 140/88 (BP Location: Right Arm, Patient Position: Sitting, Cuff Size: Normal)   Pulse (!) 58   Ht 5\' 6"  (1.676 m)   Wt 156 lb (70.8 kg)   SpO2 98%   BMI 25.18 kg/m  General: Well Developed, well nourished, and in no acute distress.   MSK: Right hip normal. Normal motion. Tender palpation greater trochanter.    Lab and Radiology Results  Hip greater trochanteric injection: right Consent obtained and timeout performed. Area of maximum tenderness palpated and identified. Skin cleaned with alcohol, cold spray applied. A 22g needle was used to access the greater trochanteric bursa. 40 mg of Kenalog and 2 mL of Marcaine were used to inject the trochanteric bursa. Patient tolerated the procedure well.  EXAM: DG HIP (WITH OR WITHOUT PELVIS) 2-3V RIGHT   COMPARISON:  None.   FINDINGS: Upright frontal pelvis as well as upright frontal and supine lateral right hip images were obtained. There is no evident acute fracture or dislocation. There is mild narrowing of each hip joint, slightly more notable on the right than the left. There is apparent calcification in the inferior aspect of the right hip joint medially. There is bony overgrowth along each  superolateral acetabulum. Sacroiliac joints appear symmetric bilaterally.   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 Calcification within the medial inferior right hip joint may represent residua of old trauma. Bony overgrowth along each superolateral acetabulum potentially places patient at increased risk for femoroacetabular impingement.   No fracture or dislocation.     Electronically Signed   By: Lowella Grip III M.D.   On: 06/20/2020 13:02 I, Lynne Leader, personally (independently) visualized and performed the interpretation of the images attached in this note.     Assessment and Plan: 75 y.o. female with right lateral hip pain.  Pain thought to be due to greater trochanteric bursitis and abductor tendinopathy.  This is been a recurrent ongoing problem for over 9 months now.  She has had good results in the past with physical therapy however the most recent episode of this over the last 2 months has not responded as well as we would like to physical therapy.  Plan for trial of injection today.  If not improved would recommend MRI to further characterize cause of pain and help determine future treatment options.   PDMP not reviewed this encounter. Orders Placed This Encounter  Procedures   [place] LIMITED JOINT SPACE STRUCTURES LOW RIGHT(NO LINKED CHARGES)    Order Specific Question:   Reason for Exam (SYMPTOM  OR DIAGNOSIS REQUIRED)    Answer:   R  hip pain    Order Specific Question:   Preferred imaging location?    Answer:   [facility]   No orders of the defined types were placed in this encounter.    Discussed warning signs or symptoms. Please see discharge instructions. Patient expresses understanding.   The above documentation has been reviewed and is accurate and complete Lynne Leader, M.D.

## 2021-04-20 ENCOUNTER — Encounter: Payer: Self-pay | Admitting: Adult Health

## 2021-04-22 ENCOUNTER — Encounter: Payer: Self-pay | Admitting: Family Medicine

## 2021-04-22 DIAGNOSIS — M21371 Foot drop, right foot: Secondary | ICD-10-CM

## 2021-04-22 DIAGNOSIS — M5416 Radiculopathy, lumbar region: Secondary | ICD-10-CM

## 2021-04-22 DIAGNOSIS — M7061 Trochanteric bursitis, right hip: Secondary | ICD-10-CM

## 2021-04-22 DIAGNOSIS — M48061 Spinal stenosis, lumbar region without neurogenic claudication: Secondary | ICD-10-CM

## 2021-04-23 ENCOUNTER — Encounter (HOSPITAL_BASED_OUTPATIENT_CLINIC_OR_DEPARTMENT_OTHER): Payer: Self-pay | Admitting: Emergency Medicine

## 2021-04-23 ENCOUNTER — Other Ambulatory Visit: Payer: Self-pay

## 2021-04-23 ENCOUNTER — Emergency Department (HOSPITAL_BASED_OUTPATIENT_CLINIC_OR_DEPARTMENT_OTHER)
Admission: EM | Admit: 2021-04-23 | Discharge: 2021-04-23 | Disposition: A | Discharge status: Home / Self Care | Payer: Medicare Other | Attending: Emergency Medicine | Admitting: Emergency Medicine

## 2021-04-23 ENCOUNTER — Ambulatory Visit: Payer: Medicare Other | Admitting: Family Medicine

## 2021-04-23 DIAGNOSIS — E1122 Type 2 diabetes mellitus with diabetic chronic kidney disease: Secondary | ICD-10-CM | POA: Insufficient documentation

## 2021-04-23 DIAGNOSIS — Z87891 Personal history of nicotine dependence: Secondary | ICD-10-CM | POA: Diagnosis not present

## 2021-04-23 DIAGNOSIS — M25551 Pain in right hip: Secondary | ICD-10-CM | POA: Insufficient documentation

## 2021-04-23 DIAGNOSIS — Z7984 Long term (current) use of oral hypoglycemic drugs: Secondary | ICD-10-CM | POA: Insufficient documentation

## 2021-04-23 DIAGNOSIS — E1142 Type 2 diabetes mellitus with diabetic polyneuropathy: Secondary | ICD-10-CM | POA: Diagnosis not present

## 2021-04-23 DIAGNOSIS — Z794 Long term (current) use of insulin: Secondary | ICD-10-CM | POA: Insufficient documentation

## 2021-04-23 DIAGNOSIS — Z7982 Long term (current) use of aspirin: Secondary | ICD-10-CM | POA: Diagnosis not present

## 2021-04-23 DIAGNOSIS — I129 Hypertensive chronic kidney disease with stage 1 through stage 4 chronic kidney disease, or unspecified chronic kidney disease: Secondary | ICD-10-CM | POA: Diagnosis not present

## 2021-04-23 DIAGNOSIS — Z79899 Other long term (current) drug therapy: Secondary | ICD-10-CM | POA: Diagnosis not present

## 2021-04-23 DIAGNOSIS — N183 Chronic kidney disease, stage 3 unspecified: Secondary | ICD-10-CM | POA: Insufficient documentation

## 2021-04-23 DIAGNOSIS — M25571 Pain in right ankle and joints of right foot: Secondary | ICD-10-CM | POA: Insufficient documentation

## 2021-04-23 MED ORDER — DONEPEZIL HCL 10 MG PO TBDP: 10.0000 mg | ORAL_TABLET | Freq: Every day | ORAL | 1 refills | Status: DC

## 2021-04-23 MED ORDER — HYDROCODONE-ACETAMINOPHEN 5-325 MG PO TABS: 1.0000 | ORAL_TABLET | ORAL | 0 refills | Status: DC | PRN

## 2021-04-23 MED ORDER — LIDOCAINE 5 % EX PTCH: 1.0000 | MEDICATED_PATCH | CUTANEOUS | 0 refills | Status: DC

## 2021-04-23 NOTE — ED Triage Notes (Signed)
Pt arrive sto ED with c/o of right hip pain x4 days. Pt reports she had a Cortizone shot in hip x5 days ago and since the shot her pain has increased. Today pt now reports right foot tingling/numbness.

## 2021-04-23 NOTE — Telephone Encounter (Signed)
Patient's daughter called in regards to the message sent. She is also experiencing tingling in her toes on the right side.  She also said that the patient is in a lot of pain and is wanting to know if something can be sent in to [facility].  After talking to the patient's daughter, she is in a lot pain that is almost unbearable and causing her not to be able to sleep. She is going to go to the [facility] ED to be evaluated.

## 2021-04-23 NOTE — Discharge Instructions (Addendum)
Follow-up with Dr. Georgina Snell regarding an MRI of your hip and/or lumbar spine.  Return to emergency room if you have any worsening symptoms.

## 2021-04-23 NOTE — Telephone Encounter (Signed)
Patient's daughter called to let us know that Lasonia was seen in the ED. She said that they would still still for Dr Georgina Snell to order the MRI (back and hip) and she was prescribed a pain medication and given patches to help with the pain as well.

## 2021-04-23 NOTE — ED Notes (Signed)
Patient verbalizes understanding of discharge instructions. Opportunity for questioning and answers were provided. Patient discharged from ED.  °

## 2021-04-23 NOTE — ED Provider Notes (Signed)
[facility] EMERGENCY DEPT Provider Note   CSN: [id number] Arrival date & time: 04/23/21  1249     History Chief Complaint  Patient presents with   Hip Pain    Catherine Mcconnell is a 75 y.o. female.  Patient is a 75 year old female who presents with right hip pain.  She has had some prior back pain on the right side but it never radiated down her legs.  She finished PT to [facility] for that.  Over the last week she has developed some pain in her lateral right hip.  She has some associated pain in her right ankle although she cannot really delineate whether it radiates down from the hip.  She has had some intermittent tingling in her toes but denies any current numbness.  No weakness in the leg.  She denies any falls or recent trauma.  No loss of bowel or bladder control.  No current associated back pain.  She has been followed by Dr. Georgina Snell for this pain.  She recently got an injection of cortisone in the trochanteric bursa but she has not had any improvement in symptoms.  The plan was to schedule an MRI if her symptoms were not improving but her pain got worse over the weekend so she came here for evaluation.  She also denies any fevers.      Past Medical History:  Diagnosis Date   Anemia    on meds   Arthritis    hips/wrists/back   Cataract    sx-bilateral   Chronic kidney disease    stage 3   Diabetes mellitus without complication ([place])    on meds   Hypertension    on meds   Iron deficiency    on oral iron supplements, has not required transfusion   Mixed hyperlipidemia    on meds   OSA on CPAP    compliant   Sleep apnea    uses CPAP   Thyroid nodule    removed-not on meds at this time (12/01/2020)    Patient Active Problem List   Diagnosis Date Noted   Type 2 diabetes mellitus with hyperglycemia, without long-term current use of insulin ([place]) 09/11/2020   Type 2 diabetes mellitus with diabetic polyneuropathy, without long-term current use of insulin  ([place]) 09/11/2020   Type 2 diabetes mellitus with stage 3a chronic kidney disease, without long-term current use of insulin ([place]) 09/11/2020   Type 2 diabetes mellitus with hyperglycemia, with long-term current use of insulin ([place]) 06/06/2020   Memory deficits 03/16/2020   Diabetes mellitus without complication ([place]) 67/89/3810   OSA on CPAP    Chronic kidney disease    Hypertension    Mixed hyperlipidemia     Past Surgical History:  Procedure Laterality Date   ABDOMINAL HYSTERECTOMY  1996   MASS EXCISION Right 2017   right arm mass removal   THYROID SURGERY     left side removal benign nodule     OB History   No obstetric history on file.     Family History  Problem Relation Age of Onset   Stroke Mother    Hypertension Mother    Heart disease Mother    Heart attack Father    Early death Father    Hypertension Father    Diabetes Sister    Early death Sister    Hypertension Sister    Stroke Sister    Prostate cancer Son    Cancer Son    Early death Son  Breast cancer Cousin    Diabetes Sister    Early death Sister    Hyperlipidemia Sister    Kidney disease Sister    Depression Daughter    Hypertension Daughter    Hyperlipidemia Daughter    Diabetes Sister    Hyperlipidemia Sister    Hypertension Sister    Early death Brother    Arthritis Brother    Heart attack Brother    Heart disease Brother    Arthritis Brother    Diabetes Sister    Colon polyps Neg Hx    Colon cancer Neg Hx    Esophageal cancer Neg Hx    Rectal cancer Neg Hx    Stomach cancer Neg Hx     Social History   Tobacco Use   Smoking status: Former    Pack years: 0.00   Smokeless tobacco: Never   Tobacco comments:    only socially in high school  Vaping Use   Vaping Use: Never used  Substance Use Topics   Alcohol use: Not Currently   Drug use: Never    Home Medications Prior to Admission medications   Medication Sig Start Date End Date Taking? Authorizing Provider   HYDROcodone-acetaminophen (NORCO/VICODIN) 5-325 MG tablet Take 1 tablet by mouth every 4 (four) hours as needed. 04/23/21  Yes Malvin Johns, MD  lidocaine (LIDODERM) 5 % Place 1 patch onto the skin daily. Remove & Discard patch within 12 hours or as directed by MD 04/23/21  Yes Malvin Johns, MD  amLODipine (NORVASC) 10 MG tablet Take 1 tablet by mouth every day 03/23/21   Vivi Barrack, MD  aspirin EC 81 MG tablet Take 81 mg by mouth daily.    [provider]  atorvastatin (LIPITOR) 40 MG tablet TAKE 1 TABLET BY MOUTH  DAILY 11/20/20   Inda Coke, PA  carvedilol (COREG) 6.25 MG tablet TAKE 1 TABLET(6.25 MG) BY MOUTH TWICE DAILY WITH A MEAL 12/26/20   Inda Coke, PA  donepezil (ARICEPT ODT) 10 MG disintegrating tablet Take 1 tablet (10 mg total) by mouth at bedtime. 04/23/21   Ward Givens, NP  ferrous sulfate (IRON SUPPLEMENT) 325 (65 FE) MG tablet Take 1 tablet (325 mg total) by mouth 2 (two) times daily with a meal. 08/13/18   Inda Coke, PA  furosemide (LASIX) 20 MG tablet Take 1 tablet (20 mg total) by mouth daily as needed for edema. 06/14/20   Inda Coke, PA  gabapentin (NEURONTIN) 100 MG capsule Take 3 capsules by mouth every day 01/29/21   Inda Coke, PA  insulin degludec (TRESIBA FLEXTOUCH) 100 UNIT/ML FlexTouch Pen Inject 8 Units into the skin daily. 01/19/21   Shamleffer, Oralee Rapaport Crazier, MD  Insulin Pen Needle 32G X 4 MM MISC 1 each by Does not apply route as needed. 01/19/21   Shamleffer, Enaya Howze Crazier, MD  ketoconazole (NIZORAL) 2 % cream Apply 1 fingertip amount to each foot daily. Patient taking differently: Apply 1 application topically daily as needed. Apply 1 fingertip amount to each foot daily. 04/28/20   Evelina Bucy, DPM  Lancets (ONETOUCH ULTRASOFT) lancets CHECK TWICE PER DAY BEFORE  BREAKFAST AND DINNER 12/22/20   Inda Coke, PA  losartan (COZAAR) 50 MG tablet Take 50 mg by mouth daily. 03/16/20   [provider]  LUMIGAN  0.01 % SOLN Place 1 drop into both eyes every evening. 09/15/18   [provider]  ONETOUCH ULTRA test strip USE TO TEST BLOOD SUGARS  TWICE DAILY 01/29/21   Morene Rankins,  Samantha, PA  Semaglutide (RYBELSUS) 14 MG TABS Take 14 mg by mouth daily. 09/11/20   Shamleffer,  Crazier, MD    Allergies    Lisinopril and Metformin and related  Review of Systems   Review of Systems  Constitutional:  Negative for fever.  Gastrointestinal:  Negative for nausea and vomiting.  Musculoskeletal:  Positive for arthralgias. Negative for back pain, joint swelling and neck pain.  Skin:  Negative for wound.  Neurological:  Positive for numbness. Negative for weakness and headaches.   Physical Exam Updated Vital Signs BP (!) 162/77   Pulse (!) 54   Temp 98.7 F (37.1 C)   Resp 14   SpO2 100%   Physical Exam Constitutional:      Appearance: She is well-developed.  HENT:     Head: Normocephalic and atraumatic.  Cardiovascular:     Rate and Rhythm: Normal rate.  Pulmonary:     Effort: Pulmonary effort is normal.  Musculoskeletal:        General: Tenderness present.     Cervical back: Normal range of motion and neck supple.     Comments: Positive tenderness to the lateral aspect of the right hip.  There is no swelling or deformity.  No warmth or erythema.  No pain in the knee.  No palpable tenderness to the ankle.  No swelling to the lower leg.  Pedal pulses are intact.  She has normal sensation and motor function distally in the foot.  She is able to do a straight leg raise.  She does not have pain in the hip joint on range of motion of the hip.  No tenderness along the lumbar spine.  Skin:    General: Skin is warm and dry.  Neurological:     Mental Status: She is alert and oriented to person, place, and time.    ED Results / Procedures / Treatments   Labs (all labs ordered are listed, but only abnormal results are displayed) Labs Reviewed - No data to  display  EKG None  Radiology No results found.  Procedures Procedures   Medications Ordered in ED Medications - No data to display  ED Course  I have reviewed the triage vital signs and the nursing notes.  Pertinent labs & imaging results that were available during my care of the patient were reviewed by me and considered in my medical decision making (see chart for details).    MDM Rules/Calculators/A&P                          Patient presents with pain to her right hip.  Seems to be lateral.  Although given her associated foot pain and tingling, it could be radiating from her back although there is no palpable tenderness at this point.  She denies any recent trauma.  She did have an x-ray of her hip that was done by her sports medicine doctor that showed some arthritic changes but no fracture.  Given her lack of associated trauma and lack of pain with range of motion in the hip joint, I feel an occult fracture to be unlikely.  She is neurologically intact.  She has no evidence of ischemia. She was given a prescription for short course of Vicodin and and Lidoderm patches.  She will continue to see Dr. Georgina Snell regarding scheduling an MRI.  Return precautions were given. Final Clinical Impression(s) / ED Diagnoses Final diagnoses:  Acute hip pain, right  Rx / DC Orders ED Discharge Orders          Ordered    HYDROcodone-acetaminophen (NORCO/VICODIN) 5-325 MG tablet  Every 4 hours PRN        04/23/21 1553    lidocaine (LIDODERM) 5 %  Every 24 hours        04/23/21 1553             Malvin Johns, MD 04/23/21 1559

## 2021-04-28 ENCOUNTER — Ambulatory Visit (INDEPENDENT_AMBULATORY_CARE_PROVIDER_SITE_OTHER): Payer: Medicare Other

## 2021-04-28 ENCOUNTER — Other Ambulatory Visit: Payer: Self-pay

## 2021-04-28 DIAGNOSIS — M5416 Radiculopathy, lumbar region: Secondary | ICD-10-CM

## 2021-04-28 DIAGNOSIS — M48061 Spinal stenosis, lumbar region without neurogenic claudication: Secondary | ICD-10-CM

## 2021-04-28 DIAGNOSIS — M21371 Foot drop, right foot: Secondary | ICD-10-CM | POA: Diagnosis not present

## 2021-04-28 DIAGNOSIS — M7061 Trochanteric bursitis, right hip: Secondary | ICD-10-CM

## 2021-04-30 ENCOUNTER — Other Ambulatory Visit: Payer: Self-pay | Admitting: Internal Medicine

## 2021-05-01 ENCOUNTER — Telehealth: Payer: Self-pay | Admitting: Family Medicine

## 2021-05-01 ENCOUNTER — Other Ambulatory Visit: Payer: Self-pay | Admitting: *Deleted

## 2021-05-01 ENCOUNTER — Encounter: Payer: Self-pay | Admitting: Family Medicine

## 2021-05-01 ENCOUNTER — Ambulatory Visit: Payer: Medicare Other | Admitting: Family Medicine

## 2021-05-01 DIAGNOSIS — S72001A Fracture of unspecified part of neck of right femur, initial encounter for closed fracture: Secondary | ICD-10-CM

## 2021-05-01 MED ORDER — ATORVASTATIN CALCIUM 40 MG PO TABS: 40.0000 mg | ORAL_TABLET | Freq: Every day | ORAL | 1 refills | Status: DC

## 2021-05-01 NOTE — Progress Notes (Signed)
MRI lumbar spine shows significant areas of spinal stenosis potentially causing leg pain.  However based on the MRI of your hip I think that is the main cause of pain and have already referred to orthopedic surgery.  Recheck with me in the future as needed if not improved with orthopedic surgery.

## 2021-05-01 NOTE — Telephone Encounter (Signed)
Orthopedic surgery referral placed today. 

## 2021-05-01 NOTE — Progress Notes (Signed)
MRI right hip shows subchondral fracture of the right femoral head.  This is where the bone is starting to fail and will produce quite a bit of pain.  This probably will require surgery quickly.  In the meantime stay off of the hip by using a walker to get around or even a wheelchair.  I will refer you to orthopedic surgery now.

## 2021-05-02 ENCOUNTER — Ambulatory Visit: Payer: Self-pay

## 2021-05-02 ENCOUNTER — Telehealth: Payer: Self-pay

## 2021-05-02 ENCOUNTER — Ambulatory Visit: Payer: Medicare Other | Admitting: Orthopaedic Surgery

## 2021-05-02 ENCOUNTER — Encounter: Payer: Self-pay | Admitting: Orthopaedic Surgery

## 2021-05-02 VITALS — Ht 66.0 in | Wt 156.0 lb

## 2021-05-02 DIAGNOSIS — M87051 Idiopathic aseptic necrosis of right femur: Secondary | ICD-10-CM | POA: Diagnosis not present

## 2021-05-02 MED ORDER — HYDROCODONE-ACETAMINOPHEN 5-325 MG PO TABS: 1.0000 | ORAL_TABLET | Freq: Every day | ORAL | 0 refills | Status: DC | PRN

## 2021-05-02 NOTE — Telephone Encounter (Signed)
Patient was seen in our office today patients daughter Rockwell [place] is requesting a call back from Dr.Xu regarding prehab call back:[phone]

## 2021-05-02 NOTE — Addendum Note (Signed)
Addended by: Precious Bard on: 05/02/2021 09:41 AM   Modules accepted: Orders

## 2021-05-02 NOTE — Progress Notes (Signed)
Office Visit Note   Patient: Catherine Mcconnell           Date of Birth: [birth year]           MRN: [id number] Visit Date: 05/02/2021              Requested by: Gregor Hams, MD [place],  [place] [id number] PCP: Inda Coke, [place]   Assessment & Plan: Visit Diagnoses:  1. Avascular necrosis of bone of right hip (HCC)     Plan: Based on findings the patient is symptomatic from avascular necrosis of the femoral head with collapse and edema.  I do not feel that her symptoms are primarily from her back but certainly the hip problem is likely exacerbating the underlying back problems.  Given these findings I have recommended right total hip replacement but we will have to postpone this until early September given recent cortisone injection.  Details of the surgery including risk benefits rehab recovery time expectations postoperatively were all discussed with the patient in detail.  We will redraw an A1c today.  Handicap placard was provided.  Hydrocodone refilled to help with the pain.  Follow-Up Instructions: No follow-ups on file.   Orders:  Orders Placed This Encounter  Procedures   XR Pelvis 1-2 Views   Meds ordered this encounter  Medications   HYDROcodone-acetaminophen (NORCO) 5-325 MG tablet    Sig: Take 1-2 tablets by mouth daily as needed.    Dispense:  20 tablet    Refill:  0      Procedures: No procedures performed   Clinical Data: No additional findings.   Subjective: Chief Complaint  Patient presents with   Right Hip - Pain    Catherine Mcconnell is a very pleasant 75 year old female with chronic right hip pain who has been seeing Dr. Georgina Snell for many months.  She has constant right hip pain.  Does not have much groin pain.  She does have some radiation of pain down her right leg.  This was progressively getting worse and acutely got severely more painful when she had a cortisone injection on July 7 at Dr. Clovis Riley office.  She states that she was pain-free  during the anesthetic phase but after this the pain became excruciating.  She subsequently had an MRI which showed femoral head fracture and edema.  She also had lumbar spine MRI which showed multilevel degenerative disease but not consistent with her symptoms.  She was referred to [place] for consideration for right total hip replacement.   Review of Systems  Constitutional: Negative.   HENT: Negative.    Eyes: Negative.   Respiratory: Negative.    Cardiovascular: Negative.   Endocrine: Negative.   Musculoskeletal: Negative.   Neurological: Negative.   Hematological: Negative.   Psychiatric/Behavioral: Negative.    All other systems reviewed and are negative.   Objective: Vital Signs: Ht 5\' 6"  (1.676 m)   Wt 156 lb (70.8 kg)   BMI 25.18 kg/m   Physical Exam Vitals and nursing note reviewed.  Constitutional:      Appearance: She is well-developed.  HENT:     Head: Normocephalic and atraumatic.  Pulmonary:     Effort: Pulmonary effort is normal.  Abdominal:     Palpations: Abdomen is soft.  Musculoskeletal:     Cervical back: Neck supple.  Skin:    General: Skin is warm.     Capillary Refill: Capillary refill takes less than 2 seconds.  Neurological:  Mental Status: She is alert and oriented to person, place, and time.  Psychiatric:        Behavior: Behavior normal.        Thought Content: Thought content normal.        Judgment: Judgment normal.    Ortho Exam Right hip exam shows pain with external rotation logroll that localizes to the upper thigh.  Pain with internal rotation.  Positive Stinchfield.  Negative sciatic tension.  Lateral hip is nontender. Specialty Comments:  No specialty comments available.  Imaging: No results found.   PMFS History: Patient Active Problem List   Diagnosis Date Noted   Avascular necrosis of bone of right hip ([place]) 05/02/2021   Type 2 diabetes mellitus with hyperglycemia, without long-term current use of insulin ([place])  09/11/2020   Type 2 diabetes mellitus with diabetic polyneuropathy, without long-term current use of insulin ([place]) 09/11/2020   Type 2 diabetes mellitus with stage 3a chronic kidney disease, without long-term current use of insulin ([place]) 09/11/2020   Type 2 diabetes mellitus with hyperglycemia, with long-term current use of insulin ([place]) 06/06/2020   Memory deficits 03/16/2020   Diabetes mellitus without complication ([place]) 74/05/1447   OSA on CPAP    Chronic kidney disease    Hypertension    Mixed hyperlipidemia    Past Medical History:  Diagnosis Date   Anemia    on meds   Arthritis    hips/wrists/back   Cataract    sx-bilateral   Chronic kidney disease    stage 3   Diabetes mellitus without complication ([place])    on meds   Hypertension    on meds   Iron deficiency    on oral iron supplements, has not required transfusion   Mixed hyperlipidemia    on meds   OSA on CPAP    compliant   Sleep apnea    uses CPAP   Thyroid nodule    removed-not on meds at this time (12/01/2020)    Family History  Problem Relation Age of Onset   Stroke Mother    Hypertension Mother    Heart disease Mother    Heart attack Father    Early death Father    Hypertension Father    Diabetes Sister    Early death Sister    Hypertension Sister    Stroke Sister    Prostate cancer Son    Cancer Son    Early death Son    Breast cancer Cousin    Diabetes Sister    Early death Sister    Hyperlipidemia Sister    Kidney disease Sister    Depression Daughter    Hypertension Daughter    Hyperlipidemia Daughter    Diabetes Sister    Hyperlipidemia Sister    Hypertension Sister    Early death Brother    Arthritis Brother    Heart attack Brother    Heart disease Brother    Arthritis Brother    Diabetes Sister    Colon polyps Neg Hx    Colon cancer Neg Hx    Esophageal cancer Neg Hx    Rectal cancer Neg Hx    Stomach cancer Neg Hx     Past Surgical History:  Procedure Laterality Date    ABDOMINAL HYSTERECTOMY  1996   MASS EXCISION Right 2017   right arm mass removal   THYROID SURGERY     left side removal benign nodule   Social History   Occupational History   Occupation:  Retired   Tobacco Use   Smoking status: Former   Smokeless tobacco: Never   Tobacco comments:    only socially in high school  Vaping Use   Vaping Use: Never used  Substance and Sexual Activity   Alcohol use: Not Currently   Drug use: Never   Sexual activity: Not on file

## 2021-05-03 ENCOUNTER — Other Ambulatory Visit: Payer: Self-pay | Admitting: Physician Assistant

## 2021-05-03 DIAGNOSIS — M87051 Idiopathic aseptic necrosis of right femur: Secondary | ICD-10-CM

## 2021-05-03 LAB — EXTRA SPECIMEN

## 2021-05-03 LAB — HEMOGLOBIN A1C
Hgb A1c MFr Bld: 7.4 % of total Hgb — ABNORMAL HIGH (ref <5.7)
Mean Plasma Glucose: 166 mg/dL
eAG (mmol/L): 9.2 mmol/L

## 2021-05-03 NOTE — Telephone Encounter (Signed)
Yes that would be a great idea while she's waiting for surgery

## 2021-05-03 NOTE — Telephone Encounter (Signed)
Spoke to daughter and put in order for PT

## 2021-05-03 NOTE — Telephone Encounter (Signed)
Before I call daughter back, I wanted to see what you thought about this?

## 2021-05-08 ENCOUNTER — Emergency Department (HOSPITAL_BASED_OUTPATIENT_CLINIC_OR_DEPARTMENT_OTHER)
Admission: EM | Admit: 2021-05-08 | Discharge: 2021-05-08 | Disposition: A | Discharge status: Home / Self Care | Payer: Medicare Other | Attending: Emergency Medicine | Admitting: Emergency Medicine

## 2021-05-08 ENCOUNTER — Emergency Department (HOSPITAL_BASED_OUTPATIENT_CLINIC_OR_DEPARTMENT_OTHER): Payer: Medicare Other | Admitting: Radiology

## 2021-05-08 ENCOUNTER — Encounter (HOSPITAL_BASED_OUTPATIENT_CLINIC_OR_DEPARTMENT_OTHER): Payer: Self-pay | Admitting: *Deleted

## 2021-05-08 ENCOUNTER — Other Ambulatory Visit: Payer: Self-pay

## 2021-05-08 DIAGNOSIS — Z20822 Contact with and (suspected) exposure to covid-19: Secondary | ICD-10-CM | POA: Insufficient documentation

## 2021-05-08 DIAGNOSIS — Z87891 Personal history of nicotine dependence: Secondary | ICD-10-CM | POA: Diagnosis not present

## 2021-05-08 DIAGNOSIS — N1831 Chronic kidney disease, stage 3a: Secondary | ICD-10-CM | POA: Diagnosis not present

## 2021-05-08 DIAGNOSIS — I129 Hypertensive chronic kidney disease with stage 1 through stage 4 chronic kidney disease, or unspecified chronic kidney disease: Secondary | ICD-10-CM | POA: Insufficient documentation

## 2021-05-08 DIAGNOSIS — E1122 Type 2 diabetes mellitus with diabetic chronic kidney disease: Secondary | ICD-10-CM | POA: Diagnosis not present

## 2021-05-08 DIAGNOSIS — Z79899 Other long term (current) drug therapy: Secondary | ICD-10-CM | POA: Diagnosis not present

## 2021-05-08 DIAGNOSIS — M25551 Pain in right hip: Secondary | ICD-10-CM | POA: Diagnosis present

## 2021-05-08 DIAGNOSIS — Z7982 Long term (current) use of aspirin: Secondary | ICD-10-CM | POA: Diagnosis not present

## 2021-05-08 LAB — CBC WITH DIFFERENTIAL/PLATELET
Abs Immature Granulocytes: 0 10*3/uL (ref 0.00–0.07)
Basophils Absolute: 0.1 10*3/uL (ref 0.0–0.1)
Basophils Relative: 2 %
Eosinophils Absolute: 0.1 10*3/uL (ref 0.0–0.5)
Eosinophils Relative: 4 %
HCT: 36.5 % (ref 36.0–46.0)
Hemoglobin: 12.7 g/dL (ref 12.0–15.0)
Immature Granulocytes: 0 %
Lymphocytes Relative: 44 %
Lymphs Abs: 1.3 10*3/uL (ref 0.7–4.0)
MCH: 30.1 pg (ref 26.0–34.0)
MCHC: 34.8 g/dL (ref 30.0–36.0)
MCV: 86.5 fL (ref 80.0–100.0)
Monocytes Absolute: 0.2 10*3/uL (ref 0.1–1.0)
Monocytes Relative: 8 %
Neutro Abs: 1.2 10*3/uL — ABNORMAL LOW (ref 1.7–7.7)
Neutrophils Relative %: 42 %
Platelets: 187 10*3/uL (ref 150–400)
RBC: 4.22 MIL/uL (ref 3.87–5.11)
RDW: 11.9 % (ref 11.5–15.5)
WBC: 2.9 10*3/uL — ABNORMAL LOW (ref 4.0–10.5)
nRBC: 0 % (ref 0.0–0.2)

## 2021-05-08 LAB — RESP PANEL BY RT-PCR (FLU A&B, COVID) ARPGX2
Influenza A by PCR: NEGATIVE
Influenza B by PCR: NEGATIVE
SARS Coronavirus 2 by RT PCR: NEGATIVE

## 2021-05-08 LAB — BASIC METABOLIC PANEL
Anion gap: 7 (ref 5–15)
BUN: 18 mg/dL (ref 8–23)
CO2: 29 mmol/L (ref 22–32)
Calcium: 8.9 mg/dL (ref 8.9–10.3)
Chloride: 101 mmol/L (ref 98–111)
Creatinine, Ser: 1.33 mg/dL — ABNORMAL HIGH (ref 0.44–1.00)
GFR, Estimated: 42 mL/min — ABNORMAL LOW (ref >60)
Glucose, Bld: 131 mg/dL — ABNORMAL HIGH (ref 70–99)
Potassium: 3.7 mmol/L (ref 3.5–5.1)
Sodium: 137 mmol/L (ref 135–145)

## 2021-05-08 MED ORDER — FENTANYL CITRATE (PF) 100 MCG/2ML IJ SOLN
50.0000 ug | Freq: Once | INTRAMUSCULAR | Status: AC
Start: 2021-05-08 — End: 2021-05-08
  Administered 2021-05-08: 50 ug via INTRAVENOUS
  Filled 2021-05-08: qty 2

## 2021-05-08 NOTE — ED Provider Notes (Signed)
[facility] EMERGENCY DEPT Provider Note   CSN: [id number] Arrival date & time: 05/08/21  1758     History Chief Complaint  Patient presents with  . Hip Pain    Catherine Mcconnell is a 75 y.o. female.  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  Is scheduled to have hip replacement next month.  Having worsening pain but denies any trauma or falls.  The history is provided by the patient.  Hip Pain This is a chronic problem. The problem occurs every several days. Pertinent negatives include no chest pain, no abdominal pain, no headaches and no shortness of breath. Nothing aggravates the symptoms. Nothing relieves the symptoms. She has tried nothing for the symptoms. The treatment provided no relief.      Past Medical History:  Diagnosis Date  . Anemia    on meds  . Arthritis    hips/wrists/back  . Cataract    sx-bilateral  . Chronic kidney disease    stage 3  . Diabetes mellitus without complication ([place])    on meds  . Hypertension    on meds  . Iron deficiency    on oral iron supplements, has not required transfusion  . Mixed hyperlipidemia    on meds  . OSA on CPAP    compliant  . Sleep apnea    uses CPAP  . Thyroid nodule    removed-not on meds at this time (12/01/2020)    Patient Active Problem List   Diagnosis Date Noted  . Avascular necrosis of bone of right hip ([place]) 05/02/2021  . Type 2 diabetes mellitus with hyperglycemia, without long-term current use of insulin ([place]) 09/11/2020  . Type 2 diabetes mellitus with diabetic polyneuropathy, without long-term current use of insulin ([place]) 09/11/2020  . Type 2 diabetes mellitus with stage 3a chronic kidney disease, without long-term current use of insulin ([place]) 09/11/2020  . Type 2 diabetes mellitus with hyperglycemia, with long-term current use of insulin ([place]) 06/06/2020  . Memory deficits 03/16/2020  . Diabetes mellitus without complication ([place])  [id number]  . OSA on CPAP   . Chronic kidney disease   . Hypertension   . Mixed hyperlipidemia     Past Surgical History:  Procedure Laterality Date  . ABDOMINAL HYSTERECTOMY  1996  . MASS EXCISION Right 2017   right arm mass removal  . THYROID SURGERY     left side removal benign nodule     OB History     Gravida  7   Para  6   Term      Preterm      AB  1   Living         SAB      IAB      Ectopic      Multiple      Live Births              Family History  Problem Relation Age of Onset  . Stroke Mother   . Hypertension Mother   . Heart disease Mother   . Heart attack Father   . Early death Father   . Hypertension Father   . Diabetes Sister   . Early death Sister   . Hypertension Sister   . Stroke Sister   . Prostate cancer Son   . Cancer Son   . Early death Son   . Breast cancer Cousin   . Diabetes Sister   . Early death  Sister   . Hyperlipidemia Sister   . Kidney disease Sister   . Depression Daughter   . Hypertension Daughter   . Hyperlipidemia Daughter   . Diabetes Sister   . Hyperlipidemia Sister   . Hypertension Sister   . Early death Brother   . Arthritis Brother   . Heart attack Brother   . Heart disease Brother   . Arthritis Brother   . Diabetes Sister   . Colon polyps Neg Hx   . Colon cancer Neg Hx   . Esophageal cancer Neg Hx   . Rectal cancer Neg Hx   . Stomach cancer Neg Hx     Social History   Tobacco Use  . Smoking status: Former  . Smokeless tobacco: Never  . Tobacco comments:    only socially in high school  Vaping Use  . Vaping Use: Never used  Substance Use Topics  . Alcohol use: Not Currently  . Drug use: Never    Home Medications Prior to Admission medications   Medication Sig Start Date End Date Taking? Authorizing Provider  amLODipine (NORVASC) 10 MG tablet Take 1 tablet by mouth every day 03/23/21  Yes Vivi Barrack, MD  aspirin EC 81 MG tablet Take 81 mg by mouth daily.   Yes [provider]  atorvastatin (LIPITOR) 40 MG tablet Take 1 tablet (40 mg total) by mouth daily. 05/01/21  Yes Vivi Barrack, MD  carvedilol (COREG) 6.25 MG tablet TAKE 1 TABLET(6.25 MG) BY MOUTH TWICE DAILY WITH A MEAL 12/26/20  Yes Inda Coke, PA  donepezil (ARICEPT ODT) 10 MG disintegrating tablet Take 1 tablet (10 mg total) by mouth at bedtime. 04/23/21  Yes Ward Givens, NP  ferrous sulfate (IRON SUPPLEMENT) 325 (65 FE) MG tablet Take 1 tablet (325 mg total) by mouth 2 (two) times daily with a meal. 08/13/18  Yes Inda Coke, PA  furosemide (LASIX) 20 MG tablet Take 1 tablet (20 mg total) by mouth daily as needed for edema. 06/14/20  Yes Inda Coke, PA  gabapentin (NEURONTIN) 100 MG capsule Take 3 capsules by mouth every day 01/29/21  Yes Morene Rankins, [place], PA  HYDROcodone-acetaminophen (NORCO) 5-325 MG tablet Take 1-2 tablets by mouth daily as needed. 05/02/21  Yes Leandrew Koyanagi, MD  HYDROcodone-acetaminophen (NORCO/VICODIN) 5-325 MG tablet Take 1 tablet by mouth every 4 (four) hours as needed. 04/23/21  Yes Malvin Johns, MD  losartan (COZAAR) 50 MG tablet Take 50 mg by mouth daily. 03/16/20  Yes [provider]  LUMIGAN 0.01 % SOLN Place 1 drop into both eyes every evening. 09/15/18  Yes [provider]  Semaglutide (RYBELSUS) 14 MG TABS Take 14 mg by mouth daily. 09/11/20  Yes Shamleffer, Melanie Crazier, MD  TRESIBA FLEXTOUCH 100 UNIT/ML FlexTouch Pen Inject 8 units subcutaneously daily 04/30/21  Yes Shamleffer, Melanie Crazier, MD  Insulin Pen Needle 32G X 4 MM MISC 1 each by Does not apply route as needed. 01/19/21   Shamleffer, Melanie Crazier, MD  ketoconazole (NIZORAL) 2 % cream Apply 1 fingertip amount to each foot daily. Patient taking differently: Apply 1 application topically daily as needed. Apply 1 fingertip amount to each foot daily. 04/28/20   Evelina Bucy, DPM  Lancets (ONETOUCH ULTRASOFT) lancets CHECK TWICE PER DAY BEFORE  BREAKFAST AND DINNER  12/22/20   Inda Coke, PA  lidocaine (LIDODERM) 5 % Place 1 patch onto the skin daily. Remove & Discard patch within 12 hours or as directed by MD 04/23/21  Malvin Johns, MD  [facility] ULTRA test strip USE TO TEST BLOOD SUGARS  TWICE DAILY 01/29/21   Inda Coke, PA    Allergies    Lisinopril and Metformin and related  Review of Systems   Review of Systems  Constitutional:  Negative for chills and fever.  HENT:  Negative for ear pain and sore throat.   Eyes:  Negative for pain and visual disturbance.  Respiratory:  Negative for cough and shortness of breath.   Cardiovascular:  Negative for chest pain and palpitations.  Gastrointestinal:  Negative for abdominal pain and vomiting.  Genitourinary:  Negative for dysuria and hematuria.  Musculoskeletal:  Positive for arthralgias. Negative for back pain.  Skin:  Negative for color change and rash.  Neurological:  Negative for seizures, syncope and headaches.  All other systems reviewed and are negative.  Physical Exam Updated Vital Signs BP (!) 191/88   Pulse 68   Temp 99 F (37.2 C)   Resp 16   Ht '5\' 6"'$  (1.676 m)   Wt 70.8 kg   SpO2 98%   BMI 25.18 kg/m   Physical Exam Vitals and nursing note reviewed.  Constitutional:      General: She is not in acute distress.    Appearance: She is well-developed. She is not ill-appearing.  HENT:     Head: Normocephalic and atraumatic.     Mouth/Throat:     Mouth: Mucous membranes are moist.  Eyes:     Extraocular Movements: Extraocular movements intact.     Conjunctiva/sclera: Conjunctivae normal.     Pupils: Pupils are equal, round, and reactive to light.  Cardiovascular:     Rate and Rhythm: Normal rate and regular rhythm.     Pulses: Normal pulses.     Heart sounds: Normal heart sounds. No murmur heard. Pulmonary:     Effort: Pulmonary effort is normal. No respiratory distress.     Breath sounds: Normal breath sounds.  Abdominal:     Palpations: Abdomen is soft.      Tenderness: There is no abdominal tenderness.  Musculoskeletal:        General: Tenderness (right hip) present.     Cervical back: Neck supple.  Skin:    General: Skin is warm and dry.     Capillary Refill: Capillary refill takes less than 2 seconds.  Neurological:     General: No focal deficit present.     Mental Status: She is alert.    ED Results / Procedures / Treatments   Labs (all labs ordered are listed, but only abnormal results are displayed) Labs Reviewed  CBC WITH DIFFERENTIAL/PLATELET - Abnormal; Notable for the following components:      Result Value   WBC 2.9 (*)    Neutro Abs 1.2 (*)    All other components within normal limits  BASIC METABOLIC PANEL - Abnormal; Notable for the following components:   Glucose, Bld 131 (*)    Creatinine, Ser 1.33 (*)    GFR, Estimated 42 (*)    All other components within normal limits  RESP PANEL BY RT-PCR (FLU A&B, COVID) [id number]    EKG None  Radiology DG Hip Unilat With Pelvis 2-3 Views Right  Result Date: 05/08/2021 CLINICAL DATA:  Right hip pain radiating into the right thigh. No known injury. EXAM: DG HIP (WITH OR WITHOUT PELVIS) 2-3V RIGHT COMPARISON:  Plain films right hip 06/20/2020. FINDINGS: There is no acute bony or joint abnormality on the right or left. Right worse than left  hip osteoarthritis appears unchanged. Soft tissues are negative. IMPRESSION: No acute finding. No change in right worse than left hip osteoarthritis. Electronically Signed   By: Inge Rise M.D.   On: 05/08/2021 19:43    Procedures Procedures   Medications Ordered in ED Medications  fentaNYL (SUBLIMAZE) injection 50 mcg (50 mcg Intravenous Given 05/08/21 1827)  fentaNYL (SUBLIMAZE) injection 50 mcg (50 mcg Intravenous Given 05/08/21 2049)    ED Course  I have reviewed the triage vital signs and the nursing notes.  Pertinent labs & imaging results that were available during my care of the patient were reviewed by me and considered in  my medical decision making (see chart for details).    MDM Rules/Calculators/A&P                           Gitzel Gowans is here with ongoing right hip pain.  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  She got recent steroid injection and now that has delayed right hip replacement.  She denies any new falls but just had acute on chronic pain today despite her Vicodin at home.  Neurovascular neurologically she is intact.  Hip x-ray shows no acute finding.  There is no major change in displacement of the hip or femur.  Lab work was overall unremarkable.  Talked with orthopedics on the phone who recommend continued management outpatient.  Recommended to stress being not weightbearing with walker which patient has been.  Patient has physical therapy coming out to the house soon.  Stressed that she not bear any weight to the right lower extremity and recommend follow-up with orthopedics as scheduled.  Discharged in good condition.  She felt better after doses of IV fentanyl.  This chart was dictated using voice recognition software.  Despite best efforts to proofread,  errors can occur which can change the documentation meaning.   Final Clinical Impression(s) / ED Diagnoses Final diagnoses:  Right hip pain    Rx / DC Orders ED Discharge Orders     None        Lennice Sites, DO 05/08/21 2111

## 2021-05-08 NOTE — Discharge Instructions (Addendum)
Continue home pain regimen.  Continue to use her walker with no weightbearing to the right lower extremity as previously discussed.  Follow-up with orthopedics.

## 2021-05-08 NOTE — ED Triage Notes (Signed)
Patient came in for right hip pain radiating to her right thigh.  Patient stated that she had a Cortisone shot more than a week ago and she stated that when the MD gave her the shot, MD hit her bone.  Since then, the pain radiates to her right thigh.

## 2021-05-09 ENCOUNTER — Ambulatory Visit: Payer: Medicare Other | Admitting: Physical Therapy

## 2021-05-10 ENCOUNTER — Other Ambulatory Visit: Payer: Self-pay

## 2021-05-10 ENCOUNTER — Ambulatory Visit (INDEPENDENT_AMBULATORY_CARE_PROVIDER_SITE_OTHER): Payer: Medicare Other | Admitting: Family Medicine

## 2021-05-10 ENCOUNTER — Encounter: Payer: Self-pay | Admitting: Family Medicine

## 2021-05-10 VITALS — BP 155/83 | HR 61 | Temp 97.5°F | Ht 66.0 in | Wt 151.0 lb

## 2021-05-10 DIAGNOSIS — M87051 Idiopathic aseptic necrosis of right femur: Secondary | ICD-10-CM

## 2021-05-10 DIAGNOSIS — N183 Chronic kidney disease, stage 3 unspecified: Secondary | ICD-10-CM | POA: Diagnosis not present

## 2021-05-10 MED ORDER — OXYCODONE HCL 5 MG PO CAPS: 5.0000 mg | ORAL_CAPSULE | Freq: Three times a day (TID) | ORAL | 0 refills | Status: DC | PRN

## 2021-05-10 NOTE — Patient Instructions (Signed)
It was very nice to see you today!  Please take over-the-counter Tylenol 3 times daily.  You should not exceed 4000 mg in a day.  We will send in a pain medication that she can use as needed if the Tylenol is not controlling your pain.  You can also try over-the-counter topical therapies.  Please send me a message in a few weeks let me know how you are doing.  Take care, Dr Jerline Pain  PLEASE NOTE:  If you had any lab tests please let us know if you have not heard back within a few days. You may see your results on mychart before we have a chance to review them but we will give you a call once they are reviewed by [place]. If we ordered any referrals today, please let us know if you have not heard from their office within the next week.   Please try these tips to maintain a healthy lifestyle:  Eat at least 3 REAL meals and 1-2 snacks per day.  Aim for no more than 5 hours between eating.  If you eat breakfast, please do so within one hour of getting up.   Each meal should contain half fruits/vegetables, one quarter protein, and one quarter carbs (no bigger than a computer mouse)  Cut down on sweet beverages. This includes juice, soda, and sweet tea.   Drink at least 1 glass of water with each meal and aim for at least 8 glasses per day  Exercise at least 150 minutes every week.

## 2021-05-10 NOTE — Assessment & Plan Note (Addendum)
Follows with orthopedics.  Pain management primary concern at this point.  We discussed pain management options.  Her database was reviewed without red flags.  We will start standing Tylenol 1000-1300 milligrams every 8 hours.  We will also start oxycodone 5 mg IR as needed for breakthrough pain.  Discussed side effects. She is not a candidate for NSAIDs due to her history of chronic kidney disease. She can use OTC topical medications as needed as well. She will check in with [place] in a few weeks to let us know how this is working.  Patient aware that narcotics are not a long term option and will only be used as a bridge until her hip replacement.

## 2021-05-10 NOTE — Progress Notes (Signed)
   Catherine Mcconnell is a 75 y.o. female who presents today for an office visit.  Assessment/Plan:   Chronic Problems Addressed Today: Avascular necrosis of bone of right hip ([place]) Follows with orthopedics.  Pain management primary concern at this point.  We discussed pain management options.  Her database was reviewed without red flags.  We will start standing Tylenol 1000-1300 milligrams every 8 hours.  We will also start oxycodone 5 mg IR as needed for breakthrough pain.  She is not a candidate for NSAIDs due to her history of chronic kidney disease. She can use OTC topical medications as needed as well. She will check in with [place] in a few weeks to let us know how this is working.  Patient aware that narcotics are not a long term option and will only be used as a bridge until her hip replacement.   Chronic kidney disease Not a candidate for NSAIDs.     Subjective:  HPI:   She is here today for pain management. Recently diagnosed with AVN of her right hip.  She states that she seen Dr Georgina Snell on 04/19/2021 and got cortisone shot.  Symptoms worsen.  Got MRI which showed AVN with subchondral fracture and labral tear.  She was also referred to Dr Erlinda Hong.  Planning on having hip replacement but will not be able to do this for a couple of months due to her recent steroid injection.  Ended up having to go to the ED for pain last week.  She was prescribed hydrocodone but feels like this does not last long enough.         Objective:  Physical Exam: BP (!) 155/83   Pulse 61   Temp (!) 97.5 F (36.4 C) (Temporal)   Ht '5\' 6"'$  (1.676 m)   Wt 151 lb (68.5 kg)   SpO2 100%   BMI 24.37 kg/m   Gen: No acute distress, resting comfortably CV: Regular rate and rhythm with no murmurs appreciated Pulm: Normal work of breathing, clear to auscultation bilaterally with no crackles, wheezes, or rhonchi Neuro: Grossly normal, moves all extremities Psych: Normal affect and thought content        I,Savera  Zaman,acting as a scribe for Dimas Chyle, MD.,have documented all relevant documentation on the behalf of Dimas Chyle, MD,as directed by  Dimas Chyle, MD while in the presence of Dimas Chyle, MD.  I, Dimas Chyle, MD, have reviewed all documentation for this visit. The documentation on 05/10/21 for the exam, diagnosis, procedures, and orders are all accurate and complete.  Algis Greenhouse. Jerline Pain, MD 05/10/2021 9:28 AM

## 2021-05-10 NOTE — Assessment & Plan Note (Signed)
Not a candidate for NSAIDs.

## 2021-05-16 ENCOUNTER — Telehealth: Payer: Self-pay

## 2021-05-16 NOTE — Telephone Encounter (Signed)
Pt's daughter had a question for Lauren. She wanted to know if she had time to review Avaeh's chart for rehab

## 2021-05-18 ENCOUNTER — Telehealth: Payer: Self-pay | Admitting: *Deleted

## 2021-05-18 NOTE — Telephone Encounter (Signed)
PA send today Rx Oxycodone Waiting for determination

## 2021-05-21 NOTE — Telephone Encounter (Signed)
Outcome N/Aon August 5 We have dismissed the request for coverage we received from you or your prescriber because notification has been received from you or your prescriber that you will be switched to a formulary alternative and/or quantity allowed by your benefit, or you or your provider withdrew the request.

## 2021-05-22 ENCOUNTER — Other Ambulatory Visit: Payer: Self-pay

## 2021-05-22 ENCOUNTER — Ambulatory Visit (INDEPENDENT_AMBULATORY_CARE_PROVIDER_SITE_OTHER): Payer: Medicare Other | Admitting: Physician Assistant

## 2021-05-22 ENCOUNTER — Encounter: Payer: Self-pay | Admitting: Physician Assistant

## 2021-05-22 VITALS — BP 158/80 | HR 58 | Temp 98.0°F | Ht 66.0 in | Wt 150.5 lb

## 2021-05-22 DIAGNOSIS — Z01818 Encounter for other preprocedural examination: Secondary | ICD-10-CM | POA: Diagnosis not present

## 2021-05-22 NOTE — Progress Notes (Signed)
Catherine Mcconnell is a 75 y.o. female is here to discuss: Medical Clearance  I acted as a Education administrator for [employer], PA-C Anselmo Pickler, LPN   History of Present Illness:   Chief Complaint  Patient presents with   Surgical clearance    HPI  Surgical clearance Pt is here for clearance for upcoming surgery, Right Total Hip Arthoplasty with Dr. Erlinda Hong.  She denies chest pain, exertional chest pain, SOB, unusual fatigue, swelling in lower legs. She is able to walk around her house with a walker without excessive SOB.   She sees endocrinology and nephrology. She is currently UTD on her visits and is compliant with treatment. Most recent A1c was 7.4, GFR was 42.  She is a patient at [facility].  Echocardiogram 07/12/2020:  Left ventricle cavity is normal in size. Mild concentric hypertrophy of  the left ventricle. Normal global wall motion. Normal LV systolic function  with EF 55%. Doppler evidence of grade I (impaired) diastolic dysfunction,  normal LAP. Calculated EF 55%.  Trileaflet aortic valve.  Trace aortic regurgitation.  Moderate (Grade II) mitral regurgitation.  Mild tricuspid regurgitation.  Small pericardial effusion. No hemodynamic compromise.    Health Maintenance Due  Topic Date Due   FOOT EXAM  01/18/2021   COVID-19 Vaccine (4 - Booster for [employer] series) 03/07/2021   INFLUENZA VACCINE  05/14/2021    Past Medical History:  Diagnosis Date   Anemia    on meds   Arthritis    hips/wrists/back   Cataract    sx-bilateral   Chronic kidney disease    stage 3   Diabetes mellitus without complication (HCC)    on meds   Hypertension    on meds   Iron deficiency    on oral iron supplements, has not required transfusion   Mixed hyperlipidemia    on meds   OSA on CPAP    compliant   Sleep apnea    uses CPAP   Thyroid nodule    removed-not on meds at this time (12/01/2020)     Social History   Tobacco Use   Smoking status: Former    Smokeless tobacco: Never   Tobacco comments:    only socially in high school  Vaping Use   Vaping Use: Never used  Substance Use Topics   Alcohol use: Not Currently   Drug use: Never    Past Surgical History:  Procedure Laterality Date   ABDOMINAL HYSTERECTOMY  1996   MASS EXCISION Right 2017   right arm mass removal   THYROID SURGERY     left side removal benign nodule    Family History  Problem Relation Age of Onset   Stroke Mother    Hypertension Mother    Heart disease Mother    Heart attack Father    Early death Father    Hypertension Father    Diabetes Sister    Early death Sister    Hypertension Sister    Stroke Sister    Prostate cancer Son    Cancer Son    Early death Son    Breast cancer Cousin    Diabetes Sister    Early death Sister    Hyperlipidemia Sister    Kidney disease Sister    Depression Daughter    Hypertension Daughter    Hyperlipidemia Daughter    Diabetes Sister    Hyperlipidemia Sister    Hypertension Sister    Early death Brother    Arthritis Brother  Heart attack Brother    Heart disease Brother    Arthritis Brother    Diabetes Sister    Colon polyps Neg Hx    Colon cancer Neg Hx    Esophageal cancer Neg Hx    Rectal cancer Neg Hx    Stomach cancer Neg Hx     PMHx, SurgHx, SocialHx, FamHx, Medications, and Allergies were reviewed in the Visit Navigator and updated as appropriate.   Patient Active Problem List   Diagnosis Date Noted   Avascular necrosis of bone of right hip ([place]) 05/02/2021   Type 2 diabetes mellitus with hyperglycemia, without long-term current use of insulin ([place]) 09/11/2020   Type 2 diabetes mellitus with diabetic polyneuropathy, without long-term current use of insulin ([place]) 09/11/2020   Type 2 diabetes mellitus with stage 3a chronic kidney disease, without long-term current use of insulin ([place]) 09/11/2020   Type 2 diabetes mellitus with hyperglycemia, with long-term current use of insulin ([place])  06/06/2020   Memory deficits 03/16/2020   Diabetes mellitus without complication ([place]) [id number]   OSA on CPAP    Chronic kidney disease    Hypertension    Mixed hyperlipidemia     Social History   Tobacco Use   Smoking status: Former   Smokeless tobacco: Never   Tobacco comments:    only socially in high school  Vaping Use   Vaping Use: Never used  Substance Use Topics   Alcohol use: Not Currently   Drug use: Never    Current Medications and Allergies:    Current Outpatient Medications:    amLODipine (NORVASC) 10 MG tablet, Take 1 tablet by mouth every day, Disp: 30 tablet, Rfl: 11   aspirin EC 81 MG tablet, Take 81 mg by mouth daily., Disp: , Rfl:    atorvastatin (LIPITOR) 40 MG tablet, Take 1 tablet (40 mg total) by mouth daily., Disp: 90 tablet, Rfl: 1   carvedilol (COREG) 6.25 MG tablet, TAKE 1 TABLET(6.25 MG) BY MOUTH TWICE DAILY WITH A MEAL, Disp: 180 tablet, Rfl: 0   donepezil (ARICEPT ODT) 10 MG disintegrating tablet, Take 1 tablet (10 mg total) by mouth at bedtime., Disp: 90 tablet, Rfl: 1   ferrous sulfate (IRON SUPPLEMENT) 325 (65 FE) MG tablet, Take 1 tablet (325 mg total) by mouth 2 (two) times daily with a meal., Disp: 60 tablet, Rfl: 2   furosemide (LASIX) 20 MG tablet, Take 1 tablet (20 mg total) by mouth daily as needed for edema., Disp: 30 tablet, Rfl: 1   gabapentin (NEURONTIN) 100 MG capsule, Take 3 capsules by mouth every day, Disp: 90 capsule, Rfl: 3   HYDROcodone-acetaminophen (NORCO) 5-325 MG tablet, Take 1-2 tablets by mouth daily as needed., Disp: 20 tablet, Rfl: 0   Insulin Pen Needle 32G X 4 MM MISC, 1 each by Does not apply route as needed., Disp: 100 each, Rfl: 3   Lancets (ONETOUCH ULTRASOFT) lancets, CHECK TWICE PER DAY BEFORE  BREAKFAST AND DINNER, Disp: 200 each, Rfl: 4   losartan (COZAAR) 50 MG tablet, Take 50 mg by mouth daily., Disp: , Rfl:    LUMIGAN 0.01 % SOLN, Place 1 drop into both eyes every evening., Disp: , Rfl:    ONETOUCH ULTRA  test strip, USE TO TEST BLOOD SUGARS  TWICE DAILY, Disp: 200 strip, Rfl: 3   Semaglutide (RYBELSUS) 14 MG TABS, Take 14 mg by mouth daily., Disp: 90 tablet, Rfl: 2   TRESIBA FLEXTOUCH 100 UNIT/ML FlexTouch Pen, Inject 8 units subcutaneously daily, Disp:  15 mL, Rfl: 3   oxycodone (OXY-IR) 5 MG capsule, Take 1 capsule (5 mg total) by mouth every 8 (eight) hours as needed. (Patient not taking: Reported on 05/22/2021), Disp: 60 capsule, Rfl: 0   Allergies  Allergen Reactions   Lisinopril Rash   Metformin And Related Swelling    Review of Systems   ROS Negative unless otherwise specified per HPI.  Vitals:   Vitals:   05/22/21 1104  BP: (!) 158/80  Pulse: (!) 58  Temp: 98 F (36.7 C)  TempSrc: Temporal  SpO2: 98%  Weight: 150 lb 8 oz (68.3 kg)  Height: '5\' 6"'$  (1.676 m)     Body mass index is 24.29 kg/m.   Physical Exam:    Physical Exam Vitals and nursing note reviewed.  Constitutional:      General: She is not in acute distress.    Appearance: She is well-developed. She is not ill-appearing or toxic-appearing.  Cardiovascular:     Rate and Rhythm: Normal rate and regular rhythm.     Pulses: Normal pulses.     Heart sounds: Normal heart sounds, S1 normal and S2 normal.     Comments: No LE edema Pulmonary:     Effort: Pulmonary effort is normal.     Breath sounds: Normal breath sounds.  Skin:    General: Skin is warm and dry.  Neurological:     Mental Status: She is alert.     GCS: GCS eye subscore is 4. GCS verbal subscore is 5. GCS motor subscore is 6.  Psychiatric:        Speech: Speech normal.        Behavior: Behavior normal. Behavior is cooperative.     Assessment and Plan:    Addelynne was seen today for surgical clearance.  Diagnoses and all orders for this visit:  Pre-operative clearance -     EKG 12-Lead  EKG tracing is personally reviewed.  EKG notes NSR.  No acute changes. We were unaware prior to visit that she has an established cardiologist. She  is going to make an appointment with their office for pre-op evaluation.  In regards to medical clearance, I believe that she is a suitable candidate for this procedure. Labs from last month reviewed.   CMA or LPN served as scribe during this visit. History, Physical, and Plan performed by medical provider. The above documentation has been reviewed and is accurate and complete.  Inda Coke, PA-C [place], [place] 05/22/2021  Follow-up: No follow-ups on file.

## 2021-05-22 NOTE — Patient Instructions (Signed)
It was great to see you!  Please call Dr. Irven Shelling office for your medical clearance.    Adrian Prows, MD, [facility] Office: [phone]   Best of luck with your surgery!   Take care,  Inda Coke PA-C

## 2021-05-23 ENCOUNTER — Other Ambulatory Visit: Payer: Self-pay

## 2021-05-23 MED ORDER — RYBELSUS 14 MG PO TABS: 14.0000 mg | ORAL_TABLET | Freq: Every day | ORAL | 2 refills | Status: DC

## 2021-05-30 ENCOUNTER — Other Ambulatory Visit: Payer: Self-pay | Admitting: Internal Medicine

## 2021-06-04 ENCOUNTER — Other Ambulatory Visit: Payer: Self-pay

## 2021-06-04 ENCOUNTER — Encounter: Payer: Self-pay | Admitting: Cardiology

## 2021-06-04 ENCOUNTER — Ambulatory Visit: Payer: Medicare Other | Admitting: Cardiology

## 2021-06-04 VITALS — BP 153/77 | HR 58 | Temp 98.7°F | Resp 16 | Ht 66.0 in | Wt 154.0 lb

## 2021-06-04 DIAGNOSIS — Z01810 Encounter for preprocedural cardiovascular examination: Secondary | ICD-10-CM

## 2021-06-04 DIAGNOSIS — E782 Mixed hyperlipidemia: Secondary | ICD-10-CM

## 2021-06-04 DIAGNOSIS — I10 Essential (primary) hypertension: Secondary | ICD-10-CM

## 2021-06-04 NOTE — Progress Notes (Signed)
Follow up visit  Subjective:   Catherine Mcconnell, female    DOB: Jul 17, 1946, 75 y.o.   MRN: [id number]   HPI  Chief Complaint  Patient presents with   Hypertension   Medical Clearance    RT hip replacement    75 y/o [place] American female with hypertension, hyperlipidemia, type 2 DM, CKD 3, here for pre-op risk stratification   Patient is here with her daughter today.  She is going to undergo right hip arthroplasty for an injuries that she sustained a few weeks ago.  Prior to that, she was able to climb 13 steps to her basement several times a day without any complaints of chest pain or shortness of breath.  Blood pressure is elevated today, it is usually better controlled on this.  Current Outpatient Medications on File Prior to Visit  Medication Sig Dispense Refill   amLODipine (NORVASC) 10 MG tablet Take 1 tablet by mouth every day 30 tablet 11   aspirin EC 81 MG tablet Take 81 mg by mouth daily.     atorvastatin (LIPITOR) 40 MG tablet Take 1 tablet (40 mg total) by mouth daily. 90 tablet 1   carvedilol (COREG) 6.25 MG tablet TAKE 1 TABLET(6.25 MG) BY MOUTH TWICE DAILY WITH A MEAL 180 tablet 0   donepezil (ARICEPT ODT) 10 MG disintegrating tablet Take 1 tablet (10 mg total) by mouth at bedtime. 90 tablet 1   ferrous sulfate (IRON SUPPLEMENT) 325 (65 FE) MG tablet Take 1 tablet (325 mg total) by mouth 2 (two) times daily with a meal. 60 tablet 2   furosemide (LASIX) 20 MG tablet Take 1 tablet (20 mg total) by mouth daily as needed for edema. 30 tablet 1   gabapentin (NEURONTIN) 100 MG capsule Take 3 capsules by mouth every day 90 capsule 3   HYDROcodone-acetaminophen (NORCO) 5-325 MG tablet Take 1-2 tablets by mouth daily as needed. 20 tablet 0   Insulin Pen Needle 32G X 4 MM MISC 1 each by Does not apply route as needed. 100 each 3   Lancets (ONETOUCH ULTRASOFT) lancets CHECK TWICE PER DAY BEFORE  BREAKFAST AND DINNER 200 each 4   losartan (COZAAR) 50 MG tablet Take 50 mg by  mouth daily.     LUMIGAN 0.01 % SOLN Place 1 drop into both eyes every evening.     ONETOUCH ULTRA test strip USE TO TEST BLOOD SUGARS  TWICE DAILY 200 strip 3   oxycodone (OXY-IR) 5 MG capsule Take 1 capsule (5 mg total) by mouth every 8 (eight) hours as needed. (Patient not taking: Reported on 05/22/2021) 60 capsule 0   Semaglutide (RYBELSUS) 14 MG TABS Take 14 mg by mouth daily. 90 tablet 2   TRESIBA FLEXTOUCH 100 UNIT/ML FlexTouch Pen Inject 8 units subcutaneously daily 15 mL 3   No current facility-administered medications on file prior to visit.    Cardiovascular & other pertient studies:  EKG 06/04/2021: Sinus rhythm 62 bpm Left ventricular hypertrophy   Echocardiogram 07/12/2020:  Left ventricle cavity is normal in size. Mild concentric hypertrophy of  the left ventricle. Normal global wall motion. Normal LV systolic function  with EF 55%. Doppler evidence of grade I (impaired) diastolic dysfunction,  normal LAP. Calculated EF 55%.  Trileaflet aortic valve.  Trace aortic regurgitation.  Moderate (Grade II) mitral regurgitation.  Mild tricuspid regurgitation.  Small pericardial effusion. No hemodynamic compromise.  Exercise Treadmill Stress Test 03/06/2018:  Indication: Cardiac Evaluation   The patient exercised on Bruce protocol  for 5:04 min. Patient achieved  7.05 METS and reached HR  148 bpm, which is   99% of maximum age-predicted HR.  Stress test terminated due to Fatigue.   Exercise capacity was below average for age. HR Response to Exercise: Exaggerated HR response.BP Response to Exercise: Resting hypertension- exaggerated response. Chest Pain: none. Arrhythmias: none. ST Changes: With peak exercise there waere nonspecific ST-T changes in inferior leads.    Overall Impression: Normal stress test. Continue primary/secondary prevention.    Recent labs: 05/08/2021: Glucose 131, BUN/Cr 18/1.33. EGFR 42. Na/K 137/3.7.  H/H 12/36. MCV 86. Platelets 187 HbA1C  7.4%  05/2020: Chol 217, TG 108, HDL 61, LDL 134 TSH 3.4 normal    Review of Systems  Cardiovascular:  Negative for chest pain, dyspnea on exertion, leg swelling, palpitations and syncope.  Musculoskeletal:  Positive for joint pain (Right hip).        Vitals:   06/04/21 1404 06/04/21 1417  BP: (!) 153/77 (!) 153/77  Pulse: 62 (!) 58  Resp:  16  Temp:    SpO2:  98%    Body mass index is 24.86 kg/m. Filed Weights   06/04/21 1403  Weight: 154 lb (69.9 kg)     Objective:   Physical Exam Vitals and nursing note reviewed.  Constitutional:      General: She is not in acute distress. Neck:     Vascular: No JVD.  Cardiovascular:     Rate and Rhythm: Normal rate and regular rhythm.     Heart sounds: Normal heart sounds. No murmur heard. Pulmonary:     Effort: Pulmonary effort is normal.     Breath sounds: Normal breath sounds. No wheezing or rales.  Musculoskeletal:     Right lower leg: No edema.     Left lower leg: No edema.          Assessment & Recommendations:   75 y/o [place] American female with hypertension, hyperlipidemia, type 2 DM, CKD 3.  Preoperative stratification: No angina symptoms at baseline.  Will obtain echocardiogram.  If no major abnormalities noted, reasonable to proceed with hip arthroplasty as planned.   Hypertension: Per the patient, blood pressure is generally well controlled.  No changes made to her antihypertensive therapy today.  Continue follow-up with PCP.  Hyperlipidemia: Continue atorvastatin for now.  We will check lipid panel.  Type 2 DM: Continue follow-up with PCP.  Primary prevention: In absence of known CAD, low risk stress test (2019), and anemia, risks of aspirin outweigh benefits. Stopped aspirin.   F/u as needed    Nigel Mormon, MD Pager: [phone] Office: [phone]

## 2021-06-10 ENCOUNTER — Other Ambulatory Visit: Payer: Self-pay | Admitting: Physician Assistant

## 2021-06-10 ENCOUNTER — Other Ambulatory Visit: Payer: Self-pay | Admitting: Internal Medicine

## 2021-06-12 ENCOUNTER — Other Ambulatory Visit: Payer: Self-pay

## 2021-06-12 ENCOUNTER — Ambulatory Visit: Payer: Medicare Other

## 2021-06-12 DIAGNOSIS — I10 Essential (primary) hypertension: Secondary | ICD-10-CM

## 2021-06-12 DIAGNOSIS — Z01810 Encounter for preprocedural cardiovascular examination: Secondary | ICD-10-CM

## 2021-06-12 LAB — LIPID PANEL
Chol/HDL Ratio: 3.5 ratio (ref 0.0–4.4)
Cholesterol, Total: 215 mg/dL — ABNORMAL HIGH (ref 100–199)
HDL: 61 mg/dL (ref >39)
LDL Chol Calc (NIH): 136 mg/dL — ABNORMAL HIGH (ref 0–99)
Triglycerides: 102 mg/dL (ref 0–149)
VLDL Cholesterol Cal: 18 mg/dL (ref 5–40)

## 2021-06-15 ENCOUNTER — Other Ambulatory Visit: Payer: Self-pay

## 2021-06-15 MED ORDER — ATORVASTATIN CALCIUM 80 MG PO TABS: 80.0000 mg | ORAL_TABLET | Freq: Every day | ORAL | 1 refills | Status: DC

## 2021-06-15 NOTE — Progress Notes (Signed)
Normal pumping function of the heart. No severe heart valve abnormalities noted.   Cholesterol is elevated. Recommend increasing lipitor to 80 mg daily. If agreeable, please send prescription 80 mg 90 pills with 1 refill  Low cardiac risk for hip surgery.  Thanks MJP

## 2021-06-15 NOTE — Progress Notes (Signed)
Pt aware.

## 2021-06-29 ENCOUNTER — Other Ambulatory Visit: Payer: Self-pay | Admitting: Internal Medicine

## 2021-07-05 ENCOUNTER — Other Ambulatory Visit: Payer: Self-pay

## 2021-07-11 ENCOUNTER — Other Ambulatory Visit: Payer: Self-pay

## 2021-07-11 ENCOUNTER — Encounter: Payer: Self-pay | Admitting: Internal Medicine

## 2021-07-11 ENCOUNTER — Telehealth: Payer: Self-pay

## 2021-07-11 ENCOUNTER — Ambulatory Visit (INDEPENDENT_AMBULATORY_CARE_PROVIDER_SITE_OTHER): Payer: Medicare Other | Admitting: Internal Medicine

## 2021-07-11 VITALS — BP 146/90 | HR 60 | Ht 66.0 in | Wt 158.4 lb

## 2021-07-11 DIAGNOSIS — I10 Essential (primary) hypertension: Secondary | ICD-10-CM

## 2021-07-11 DIAGNOSIS — E1165 Type 2 diabetes mellitus with hyperglycemia: Secondary | ICD-10-CM | POA: Diagnosis not present

## 2021-07-11 DIAGNOSIS — E1142 Type 2 diabetes mellitus with diabetic polyneuropathy: Secondary | ICD-10-CM

## 2021-07-11 DIAGNOSIS — E1122 Type 2 diabetes mellitus with diabetic chronic kidney disease: Secondary | ICD-10-CM

## 2021-07-11 DIAGNOSIS — N1831 Chronic kidney disease, stage 3a: Secondary | ICD-10-CM

## 2021-07-11 LAB — POCT GLYCOSYLATED HEMOGLOBIN (HGB A1C): Hemoglobin A1C: 7.8 % — AB (ref 4.0–5.6)

## 2021-07-11 LAB — GLUCOSE, POCT (MANUAL RESULT ENTRY): POC Glucose: 193 mg/dl — AB (ref 70–99)

## 2021-07-11 MED ORDER — ONETOUCH ULTRASOFT LANCETS MISC: 4 refills | Status: DC

## 2021-07-11 MED ORDER — CARVEDILOL 6.25 MG PO TABS: ORAL_TABLET | ORAL | 1 refills | Status: DC

## 2021-07-11 MED ORDER — AMLODIPINE BESYLATE 10 MG PO TABS: 10.0000 mg | ORAL_TABLET | Freq: Every day | ORAL | 1 refills | Status: DC

## 2021-07-11 NOTE — Progress Notes (Signed)
Name: Catherine Mcconnell  Age/ Sex: 75 y.o., female   MRN/ DOB: [id number], 03/12/1946     PCP: Inda Coke, PA   Reason for Endocrinology Evaluation: Type 2 Diabetes Mellitus  Initial Endocrine Consultative Visit: 08/10/2019    PATIENT IDENTIFIER: Catherine Mcconnell is a 75 y.o. female with a past medical history of T2DM and HTN. The patient has followed with Endocrinology clinic since 08/10/2019 for consultative assistance with management of her diabetes.  DIABETIC HISTORY:  Catherine Mcconnell was diagnosed with DM at age 21.  Her hemoglobin A1c has ranged from 7.7% in 2021, peaking at 10.6%  in 2021    She transitioned care from Dr. Loanne Drilling in  05/2020. She was on Januvia, Rybelsus and had tresiba on her med list but she has stopped it due to swelling. Her A1c was 10.6 % in 04/2020 . We stopped Januvia, increased rybelsus and restarted Tresiba   SUBJECTIVE:   During the last visit (01/11/2021): A1c 8.5 % . We continued Rybelsus and decreased basal insulin   Today (07/11/2021): Ms. Catherine Mcconnell is here for a follow up on diabetes management. She is accompanied by her daughter.  She checks her blood sugars 2 times daily. The patient has not had hypoglycemic episodes since the last clinic visit.   Denies nausea , diarrhea or vomiting   She is pending right hip sx 08/06/2021  HOME DIABETES REGIMEN:  Rybelsus 14 mg daily  With Breakfast  Tresiba 8 units daily     Statin: yes ACE-I/ARB: yes    METER DOWNLOAD SUMMARY:Did not bring       DIABETIC COMPLICATIONS: Microvascular complications:  Neuropathy, CKD III Denies: retinopathy Last Eye Exam: Completed 05/29/2021  Macrovascular complications:   Denies: CAD, CVA, PVD   HISTORY:  Past Medical History:  Past Medical History:  Diagnosis Date   Anemia    on meds   Arthritis    hips/wrists/back   Cataract    sx-bilateral   Chronic kidney disease    stage 3   Diabetes mellitus without complication ([place])    on meds    Hypertension    on meds   Iron deficiency    on oral iron supplements, has not required transfusion   Mixed hyperlipidemia    on meds   OSA on CPAP    compliant   Sleep apnea    uses CPAP   Thyroid nodule    removed-not on meds at this time (12/01/2020)   Past Surgical History:  Past Surgical History:  Procedure Laterality Date   ABDOMINAL HYSTERECTOMY  1996   MASS EXCISION Right 2017   right arm mass removal   THYROID SURGERY     left side removal benign nodule   Social History:  reports that she quit smoking about 62 years ago. Her smoking use included cigarettes. She has a 0.25 pack-year smoking history. She has never used smokeless tobacco. She reports that she does not currently use alcohol. She reports that she does not use drugs. Family History:  Family History  Problem Relation Age of Onset   Stroke Mother    Hypertension Mother    Heart disease Mother    Heart attack Father    Early death Father    Hypertension Father    Diabetes Sister    Early death Sister    Hypertension Sister    Stroke Sister    Prostate cancer Son    Cancer Son    Early death Son  Breast cancer Cousin    Diabetes Sister    Early death Sister    Hyperlipidemia Sister    Kidney disease Sister    Depression Daughter    Hypertension Daughter    Hyperlipidemia Daughter    Diabetes Sister    Hyperlipidemia Sister    Hypertension Sister    Early death Brother    Arthritis Brother    Heart attack Brother    Heart disease Brother    Arthritis Brother    Diabetes Sister    Colon polyps Neg Hx    Colon cancer Neg Hx    Esophageal cancer Neg Hx    Rectal cancer Neg Hx    Stomach cancer Neg Hx      HOME MEDICATIONS: Allergies as of 07/11/2021       Reactions   Lisinopril Rash   Metformin And Related Swelling        Medication List        Accurate as of July 11, 2021  9:11 AM. If you have any questions, ask your nurse or doctor.          amLODipine 10 MG  tablet Commonly known as: NORVASC Take 1 tablet by mouth every day   atorvastatin 80 MG tablet Commonly known as: LIPITOR Take 1 tablet (80 mg total) by mouth daily.   carvedilol 6.25 MG tablet Commonly known as: COREG TAKE 1 TABLET(6.25 MG) BY MOUTH TWICE DAILY WITH A MEAL   donepezil 10 MG disintegrating tablet Commonly known as: ARICEPT ODT Take 1 tablet (10 mg total) by mouth at bedtime.   ferrous sulfate 325 (65 FE) MG tablet Commonly known as: Iron Supplement Take 1 tablet (325 mg total) by mouth 2 (two) times daily with a meal.   furosemide 20 MG tablet Commonly known as: LASIX Take 1 tablet (20 mg total) by mouth daily as needed for edema.   gabapentin 100 MG capsule Commonly known as: NEURONTIN Take 3 capsules by mouth every day   HYDROcodone-acetaminophen 5-325 MG tablet Commonly known as: Norco Take 1-2 tablets by mouth daily as needed.   losartan 50 MG tablet Commonly known as: COZAAR Take 50 mg by mouth daily.   Lumigan 0.01 % Soln Generic drug: bimatoprost Place 1 drop into both eyes every evening.   OneTouch Ultra test strip Generic drug: glucose blood USE TO TEST BLOOD SUGARS  TWICE DAILY   onetouch ultrasoft lancets CHECK TWICE PER DAY BEFORE  BREAKFAST AND DINNER   oxycodone 5 MG capsule Commonly known as: OXY-IR Take 1 capsule (5 mg total) by mouth every 8 (eight) hours as needed.   Rybelsus 14 MG Tabs Generic drug: Semaglutide Take 1 tablet by mouth every day   Tresiba FlexTouch 100 UNIT/ML FlexTouch Pen Generic drug: insulin degludec Inject 8 units subcutaneously daily   TRUEplus Pen Needles 32G X 4 MM Misc Generic drug: Insulin Pen Needle Use as directed every day as needed         OBJECTIVE:   Vital Signs: BP (!) 146/90 (BP Location: Left Arm, Patient Position: Sitting, Cuff Size: Small)   Pulse 60   Ht 5\' 6"  (1.676 m)   Wt 158 lb 6.4 oz (71.8 kg)   SpO2 97%   BMI 25.57 kg/m   Wt Readings from Last 3 Encounters:   07/11/21 158 lb 6.4 oz (71.8 kg)  06/04/21 154 lb (69.9 kg)  05/22/21 150 lb 8 oz (68.3 kg)     Exam: General: Pt appears well and  is in NAD  Lungs: Clear with good BS bilat with no rales, rhonchi, or wheezes  Heart: RRR   Extremities: No pretibial edema. .  Neuro: MS is good with appropriate affect, pt is alert and Ox3      DM foot exam: 06/06/2020   The skin of the feet is intact without sores or ulcerations. The pedal pulses are 2+ on right and 2+ on left. The sensation is intact to a screening 5.07, 10 gram monofilament bilaterally   DATA REVIEWED:  Lab Results  Component Value Date   HGBA1C 7.8 (A) 07/11/2021   HGBA1C 7.4 (H) 05/02/2021   HGBA1C 7.4 (A) 01/11/2021   Lab Results  Component Value Date   MICROALBUR 140.4 06/06/2020   LDLCALC 136 (H) 06/11/2021   CREATININE 1.33 (H) 05/08/2021   Lab Results  Component Value Date   MICRALBCREAT 1,543 (H) 06/06/2020     Lab Results  Component Value Date   CHOL 215 (H) 06/11/2021   HDL 61 06/11/2021   LDLCALC 136 (H) 06/11/2021   TRIG 102 06/11/2021   CHOLHDL 3.5 06/11/2021         ASSESSMENT / PLAN / RECOMMENDATIONS:   1) Type 2 Diabetes Mellitus,Sub- Optimally controlled, With neuropathic and CKD III  complications - Most recent A1c of 7.8  %. Goal A1c < 7.5 %.     -Her A1c has slightly increased from 7.4% to 7.8% -Daughter notes appetite has improved and patient has been noted with weight gain hence the increased insulin requirement. -We will increase insulin as below -She is maxed out on Rybelsus -If her fasting BG's are consistently> 150 mg/DL she will increase Tresiba to 10 units    MEDICATIONS:  - Continue Rybelsus 14 mg daily  With Breakfast  - Increase  Tresiba to 9-10  units daily    EDUCATION / INSTRUCTIONS: BG monitoring instructions: Patient is instructed to check her blood sugars 2 times a day, fasting and bedtime . Call [facility] clinic if: BG persistently < 70  I  reviewed the Rule of 15 for the treatment of hypoglycemia in detail with the patient. Literature supplied.   2) Diabetic complications:  Eye: Does not have known diabetic retinopathy.  Neuro/ Feet: Does  have known diabetic peripheral neuropathy .  Renal: Patient does  have known baseline CKD. She   is  on an ACEI/ARB at present.     F/U in 6  months    Signed electronically by: Mack Guise, MD  [facility] End[facility]  [address] [place], [place] [address] Phone: [phone] FAX: [phone]   CC: Inda Coke, [place] [address] Phone: [phone]  Fax: [phone]  Return to Endocrinology clinic as below: Future Appointments  Date Time Provider [place]  08/21/2021  1:15 PM Leandrew Koyanagi, MD [facility] None  10/01/2021  9:30 AM [facility] HEALTH COACH [facility]  10/24/2021 11:00 AM Ward Givens, NP [facility] None

## 2021-07-11 NOTE — Telephone Encounter (Signed)
Rx's sent to [facility] as requested.

## 2021-07-11 NOTE — Patient Instructions (Signed)
-   Continue  Rybelsus 14 mg daily  With Breakfast  - Increase Tresiba to 9  units daily    HOW TO TREAT LOW BLOOD SUGARS (Blood sugar LESS THAN 70 MG/DL) Please follow the RULE OF 15 for the treatment of hypoglycemia treatment (when your (blood sugars are less than 70 mg/dL)   STEP 1: Take 15 grams of carbohydrates when your blood sugar is low, which includes:  3-4 GLUCOSE TABS  OR 3-4 OZ OF JUICE OR REGULAR SODA OR ONE TUBE OF GLUCOSE GEL    STEP 2: RECHECK blood sugar in 15 MINUTES STEP 3: If your blood sugar is still low at the 15 minute recheck --> then, go back to STEP 1 and treat AGAIN with another 15 grams of carbohydrates.

## 2021-07-12 MED ORDER — TRESIBA FLEXTOUCH 100 UNIT/ML ~~LOC~~ SOPN: 10.0000 [IU] | PEN_INJECTOR | Freq: Every day | SUBCUTANEOUS | 3 refills | Status: DC

## 2021-07-12 MED ORDER — TRUEPLUS PEN NEEDLES 32G X 4 MM MISC: 1.0000 | Freq: Every day | 3 refills | Status: DC

## 2021-07-12 MED ORDER — RYBELSUS 14 MG PO TABS: 1.0000 | ORAL_TABLET | Freq: Every day | ORAL | 3 refills | Status: DC

## 2021-07-16 ENCOUNTER — Telehealth: Payer: Self-pay

## 2021-07-16 MED ORDER — LOSARTAN POTASSIUM 50 MG PO TABS: 50.0000 mg | ORAL_TABLET | Freq: Every day | ORAL | 1 refills | Status: DC

## 2021-07-16 NOTE — Telephone Encounter (Signed)
Rx sent to [facility] as requested.

## 2021-07-16 NOTE — Addendum Note (Signed)
Addended by: Marian Sorrow on: 07/16/2021 08:57 AM   Modules accepted: Orders

## 2021-07-19 ENCOUNTER — Other Ambulatory Visit: Payer: Self-pay

## 2021-07-19 ENCOUNTER — Ambulatory Visit (INDEPENDENT_AMBULATORY_CARE_PROVIDER_SITE_OTHER): Payer: Medicare Other

## 2021-07-19 DIAGNOSIS — Z23 Encounter for immunization: Secondary | ICD-10-CM

## 2021-07-20 ENCOUNTER — Telehealth: Payer: Self-pay | Admitting: Orthopaedic Surgery

## 2021-07-20 NOTE — Telephone Encounter (Signed)
[facility] [place] called wanting to know if we were going to do pre imaging before the pts surgery on 08/06/21. [facility]well [place] would like a CB to discuss further please.   [phone]

## 2021-07-23 ENCOUNTER — Other Ambulatory Visit: Payer: Self-pay | Admitting: Physician Assistant

## 2021-07-23 NOTE — Telephone Encounter (Signed)
Not that I am aware of.  Xu, did you need her to come in for anything?

## 2021-07-23 NOTE — Telephone Encounter (Signed)
No we should be good on the imaging. Thanks.

## 2021-07-25 NOTE — Telephone Encounter (Signed)
Daughter would like Dr. Erlinda Hong to call her regarding surgery.  States the last appt was in July and didn't go over risks/how long she will be at the hospital, does patient need rehab after. Etc.      CB [phone]

## 2021-07-25 NOTE — Telephone Encounter (Signed)
Patient aware.

## 2021-07-25 NOTE — Telephone Encounter (Signed)
Spoke with daughter and answered her questions.

## 2021-07-30 ENCOUNTER — Other Ambulatory Visit: Payer: Self-pay | Admitting: Physician Assistant

## 2021-07-30 MED ORDER — CEPHALEXIN 500 MG PO CAPS: 500.0000 mg | ORAL_CAPSULE | Freq: Four times a day (QID) | ORAL | 0 refills | Status: DC

## 2021-07-30 MED ORDER — METHOCARBAMOL 500 MG PO TABS: 500.0000 mg | ORAL_TABLET | Freq: Two times a day (BID) | ORAL | 2 refills | Status: DC | PRN

## 2021-07-30 MED ORDER — DOCUSATE SODIUM 100 MG PO CAPS: 100.0000 mg | ORAL_CAPSULE | Freq: Every day | ORAL | 2 refills | Status: DC | PRN

## 2021-07-30 MED ORDER — ASPIRIN EC 81 MG PO TBEC: 81.0000 mg | DELAYED_RELEASE_TABLET | Freq: Two times a day (BID) | ORAL | 0 refills | Status: DC

## 2021-07-30 MED ORDER — ONDANSETRON HCL 4 MG PO TABS: 4.0000 mg | ORAL_TABLET | Freq: Three times a day (TID) | ORAL | 0 refills | Status: DC | PRN

## 2021-07-30 MED ORDER — OXYCODONE-ACETAMINOPHEN 5-325 MG PO TABS: 1.0000 | ORAL_TABLET | Freq: Four times a day (QID) | ORAL | 0 refills | Status: DC | PRN

## 2021-07-31 NOTE — Progress Notes (Signed)
Surgical Instructions    Your procedure is scheduled on Monday, October 24th, 2022.   Report to [facility] Main Entrance "A" at 10:40 A.M., then check in with the Admitting office.  Call this number if you have problems the morning of surgery:  [phone]   If you have any questions prior to your surgery date call [phone]: Open Monday-Friday 8am-4pm    Remember:  Do not eat after midnight the night before your surgery  You may drink clear liquids until 09:30 the morning of your surgery.   Clear liquids allowed are: Water, Non-Citrus Juices (without pulp), Carbonated Beverages, Clear Tea, Black Coffee ONLY (NO MILK, CREAM OR POWDERED CREAMER of any kind), and Gatorade  Patient Instructions  The day of surgery (if you have diabetes): Drink ONE (1) 12 oz G2 given to you in your pre admission testing appointment by 09:30 the morning of surgery. Drink in one sitting. Do not sip.  This drink was given to you during your hospital  pre-op appointment visit.  Nothing else to drink after completing the  12 oz bottle of G2.         If you have questions, please contact your surgeon's office.     Take these medicines the morning of surgery with A SIP OF WATER:  amLODipine (NORVASC) atorvastatin (LIPITOR)  carvedilol (COREG) ondansetron (ZOFRAN) - if needed oxycodone - if needed   Follow your surgeon's instructions on when to stop Aspirin.  If no instructions were given by your surgeon then you will need to call the office to get those instructions.     As of today, STOP taking any Aspirin (unless otherwise instructed by your surgeon) Aleve, Naproxen, Ibuprofen, Motrin, Advil, Goody's, BC's, all herbal medications, fish oil, and all vitamins.   WHAT DO I DO ABOUT MY DIABETES MEDICATION?  Do not Semaglutide (RYBELSUS) the morning of surgery  Take insulin degludec (TRESIBA FLEXTOUCH) 50% of your regular dose - 5 units the night before surgery (or the morning of surgery)   HOW  TO MANAGE YOUR DIABETES BEFORE AND AFTER SURGERY  Why is it important to control my blood sugar before and after surgery? Improving blood sugar levels before and after surgery helps healing and can limit problems. A way of improving blood sugar control is eating a healthy diet by:  Eating less sugar and carbohydrates  Increasing activity/exercise  Talking with your doctor about reaching your blood sugar goals High blood sugars (greater than 180 mg/dL) can raise your risk of infections and slow your recovery, so you will need to focus on controlling your diabetes during the weeks before surgery. Make sure that the doctor who takes care of your diabetes knows about your planned surgery including the date and location.  How do I manage my blood sugar before surgery? Check your blood sugar at least 4 times a day, starting 2 days before surgery, to make sure that the level is not too high or low.  Check your blood sugar the morning of your surgery when you wake up and every 2 hours until you get to the Short Stay unit.  If your blood sugar is less than 70 mg/dL, you will need to treat for low blood sugar: Do not take insulin. Treat a low blood sugar (less than 70 mg/dL) with  cup of clear juice (cranberry or apple), 4 glucose tablets, OR glucose gel. Recheck blood sugar in 15 minutes after treatment (to make sure it is greater than 70 mg/dL). If your  blood sugar is not greater than 70 mg/dL on recheck, call [phone] for further instructions. Report your blood sugar to the short stay nurse when you get to Short Stay.  If you are admitted to the hospital after surgery: Your blood sugar will be checked by the staff and you will probably be given insulin after surgery (instead of oral diabetes medicines) to make sure you have good blood sugar levels. The goal for blood sugar control after surgery is 80-180 mg/dL.    After your COVID test   You are not required to quarantine however you are  required to wear a well-fitting mask when you are out and around people not in your household.  If your mask becomes wet or soiled, replace with a new one.  Wash your hands often with soap and water for 20 seconds or clean your hands with an alcohol-based hand sanitizer that contains at least 60% alcohol.  Do not share personal items.  Notify your provider: if you are in close contact with someone who has COVID  or if you develop a fever of 100.4 or greater, sneezing, cough, sore throat, shortness of breath or body aches.    The day of surgery:          Do not wear jewelry or makeup Do not wear lotions, powders, perfumes, or deodorant. Do not shave 48 hours prior to surgery.  Do not bring valuables to the hospital. DO Not wear nail polish, gel polish, artificial nails, or any other type of covering on natural nails including finger and toenails. If patients have artificial nails, gel coating, etc. that need to be removed by a nail salon, please have this removed prior to surgery or surgery may need to be canceled/delayed if the surgeon/ anesthesia feels like the patient is unable to be adequately monitored.              [facility] is not responsible for any belongings or valuables.  Do NOT Smoke (Tobacco/Vaping)  24 hours prior to your procedure  If you use a CPAP at night, you may bring your mask for your overnight stay.   Contacts, glasses, hearing aids, dentures or partials may not be worn into surgery, please bring cases for these belongings   For patients admitted to the hospital, discharge time will be determined by your treatment team.   Patients discharged the day of surgery will not be allowed to drive home, and someone needs to stay with them for 24 hours.  NO VISITORS WILL BE ALLOWED IN PRE-OP WHERE PATIENTS ARE PREPPED FOR SURGERY.  ONLY 1 SUPPORT PERSON MAY BE PRESENT IN THE WAITING ROOM WHILE YOU ARE IN SURGERY.  IF YOU ARE TO BE ADMITTED, ONCE YOU ARE IN YOUR ROOM YOU  WILL BE ALLOWED TWO (2) VISITORS. 1 (ONE) VISITOR MAY STAY OVERNIGHT BUT MUST ARRIVE TO THE ROOM BY 8pm.  Minor children may have two parents present. Special consideration for safety and communication needs will be reviewed on a case by case basis.  Special instructions:    Oral Hygiene is also important to reduce your risk of infection.  Remember - BRUSH YOUR TEETH THE MORNING OF SURGERY WITH YOUR REGULAR TOOTHPASTE   - Preparing For Surgery  Before surgery, you can play an important role. Because skin is not sterile, your skin needs to be as free of germs as possible. You can reduce the number of germs on your skin by washing with CHG (chlorahexidine gluconate) Soap before  surgery.  CHG is an antiseptic cleaner which kills germs and bonds with the skin to continue killing germs even after washing.     Please do not use if you have an allergy to CHG or antibacterial soaps. If your skin becomes reddened/irritated stop using the CHG.  Do not shave (including legs and underarms) for at least 48 hours prior to first CHG shower. It is OK to shave your face.  Please follow these instructions carefully.     Shower the NIGHT BEFORE SURGERY and the MORNING OF SURGERY with CHG Soap.   If you chose to wash your hair, wash your hair first as usual with your normal shampoo. After you shampoo, rinse your hair and body thoroughly to remove the shampoo.  Then [employer] and genitals (private parts) with your normal soap and rinse thoroughly to remove soap.  After that Use CHG Soap as you would any other liquid soap. You can apply CHG directly to the skin and wash gently with a scrungie or a clean washcloth.   Apply the CHG Soap to your body ONLY FROM THE NECK DOWN.  Do not use on open wounds or open sores. Avoid contact with your eyes, ears, mouth and genitals (private parts). Wash Face and genitals (private parts)  with your normal soap.   Wash thoroughly, paying special attention to the area  where your surgery will be performed.  Thoroughly rinse your body with warm water from the neck down.  DO NOT shower/wash with your normal soap after using and rinsing off the CHG Soap.  Pat yourself dry with a CLEAN TOWEL.  Wear CLEAN PAJAMAS to bed the night before surgery  Place CLEAN SHEETS on your bed the night before your surgery  DO NOT SLEEP WITH PETS.   Day of Surgery:  Take a shower with CHG soap. Wear Clean/Comfortable clothing the morning of surgery Do not apply any deodorants/lotions.   Remember to brush your teeth WITH YOUR REGULAR TOOTHPASTE.   Please read over the following fact sheets that you were given.

## 2021-08-01 ENCOUNTER — Encounter (HOSPITAL_COMMUNITY)
Admission: RE | Admit: 2021-08-01 | Discharge: 2021-08-01 | Disposition: A | Discharge status: Home / Self Care | Payer: Medicare Other | Source: Ambulatory Visit | Attending: Orthopaedic Surgery | Admitting: Orthopaedic Surgery

## 2021-08-01 ENCOUNTER — Encounter (HOSPITAL_COMMUNITY): Payer: Self-pay

## 2021-08-01 ENCOUNTER — Other Ambulatory Visit: Payer: Self-pay

## 2021-08-01 VITALS — BP 139/72 | HR 57 | Temp 98.3°F | Resp 18 | Ht 66.0 in | Wt 154.9 lb

## 2021-08-01 DIAGNOSIS — Z01818 Encounter for other preprocedural examination: Secondary | ICD-10-CM | POA: Insufficient documentation

## 2021-08-01 LAB — COMPREHENSIVE METABOLIC PANEL
ALT: 34 U/L (ref 0–44)
AST: 33 U/L (ref 15–41)
Albumin: 3.7 g/dL (ref 3.5–5.0)
Alkaline Phosphatase: 48 U/L (ref 38–126)
Anion gap: 6 (ref 5–15)
BUN: 14 mg/dL (ref 8–23)
CO2: 28 mmol/L (ref 22–32)
Calcium: 9.2 mg/dL (ref 8.9–10.3)
Chloride: 104 mmol/L (ref 98–111)
Creatinine, Ser: 1.34 mg/dL — ABNORMAL HIGH (ref 0.44–1.00)
GFR, Estimated: 41 mL/min — ABNORMAL LOW (ref >60)
Glucose, Bld: 80 mg/dL (ref 70–99)
Potassium: 3.4 mmol/L — ABNORMAL LOW (ref 3.5–5.1)
Sodium: 138 mmol/L (ref 135–145)
Total Bilirubin: 1 mg/dL (ref 0.3–1.2)
Total Protein: 6.3 g/dL — ABNORMAL LOW (ref 6.5–8.1)

## 2021-08-01 LAB — URINALYSIS, ROUTINE W REFLEX MICROSCOPIC
Bacteria, UA: NONE SEEN
Bilirubin Urine: NEGATIVE
Glucose, UA: NEGATIVE mg/dL
Hgb urine dipstick: NEGATIVE
Ketones, ur: NEGATIVE mg/dL
Leukocytes,Ua: NEGATIVE
Nitrite: NEGATIVE
Protein, ur: >=300 mg/dL — AB
Specific Gravity, Urine: 1.012 (ref 1.005–1.030)
pH: 7 (ref 5.0–8.0)

## 2021-08-01 LAB — CBC WITH DIFFERENTIAL/PLATELET
Abs Immature Granulocytes: 0 10*3/uL (ref 0.00–0.07)
Basophils Absolute: 0 10*3/uL (ref 0.0–0.1)
Basophils Relative: 1 %
Eosinophils Absolute: 0.1 10*3/uL (ref 0.0–0.5)
Eosinophils Relative: 4 %
HCT: 35.5 % — ABNORMAL LOW (ref 36.0–46.0)
Hemoglobin: 12.2 g/dL (ref 12.0–15.0)
Immature Granulocytes: 0 %
Lymphocytes Relative: 46 %
Lymphs Abs: 1.3 10*3/uL (ref 0.7–4.0)
MCH: 30.6 pg (ref 26.0–34.0)
MCHC: 34.4 g/dL (ref 30.0–36.0)
MCV: 89 fL (ref 80.0–100.0)
Monocytes Absolute: 0.2 10*3/uL (ref 0.1–1.0)
Monocytes Relative: 8 %
Neutro Abs: 1.1 10*3/uL — ABNORMAL LOW (ref 1.7–7.7)
Neutrophils Relative %: 41 %
Platelets: 189 10*3/uL (ref 150–400)
RBC: 3.99 MIL/uL (ref 3.87–5.11)
RDW: 12 % (ref 11.5–15.5)
WBC: 2.7 10*3/uL — ABNORMAL LOW (ref 4.0–10.5)
nRBC: 0 % (ref 0.0–0.2)

## 2021-08-01 LAB — GLUCOSE, CAPILLARY: Glucose-Capillary: 101 mg/dL — ABNORMAL HIGH (ref 70–99)

## 2021-08-01 LAB — SURGICAL PCR SCREEN
MRSA, PCR: NEGATIVE
Staphylococcus aureus: NEGATIVE

## 2021-08-01 LAB — PROTIME-INR
INR: 1.1 (ref 0.8–1.2)
Prothrombin Time: 14.3 seconds (ref 11.4–15.2)

## 2021-08-01 LAB — APTT: aPTT: 32 seconds (ref 24–36)

## 2021-08-01 LAB — TYPE AND SCREEN
ABO/RH(D): O POS
Antibody Screen: NEGATIVE

## 2021-08-01 NOTE — Progress Notes (Signed)
PCP Morene Rankins, PA Cardiologist - Dr. Virgina Jock  Chest x-ray -  EKG - 06/04/21 Stress Test - 03/06/18 ECHO - 06/12/21 Cardiac Cath - denies  Sleep Study - yes CPAP - yes CPAP   Fasting Blood Sugar - 100s Checks Blood Sugar 2 times a day  Blood Thinner Instructions:  Aspirin Instructions: per pt and pt daughter Rockwell [place] states cardiologist stopped ASA back in august during last visit  ERAS Protcol - yes PRE-SURGERY Ensure or G2- G2  COVID TEST- scheduled 08/02/21   Anesthesia review: n/a  Patient denies shortness of breath, fever, cough and chest pain at PAT appointment   All instructions explained to the patient, with a verbal understanding of the material. Patient agrees to go over the instructions while at home for a better understanding. Patient also instructed to self quarantine after being tested for COVID-19. The opportunity to ask questions was provided.

## 2021-08-01 NOTE — Progress Notes (Signed)
Surgical Instructions    Your procedure is scheduled on Monday, October 24th, 2022.   Report to [facility] Main Entrance "A" at 10:40 A.M., then check in with the Admitting office.  Call this number if you have problems the morning of surgery:  [phone]   If you have any questions prior to your surgery date call [phone]: Open Monday-Friday 8am-4pm    Remember:  Do not eat after midnight the night before your surgery  You may drink clear liquids until 09:30 the morning of your surgery.   Clear liquids allowed are: Water, Non-Citrus Juices (without pulp), Carbonated Beverages, Clear Tea, Black Coffee ONLY (NO MILK, CREAM OR POWDERED CREAMER of any kind), and Gatorade  Patient Instructions  The day of surgery (if you have diabetes): Drink ONE (1) 12 oz G2 given to you in your pre admission testing appointment by 09:30 the morning of surgery. Drink in one sitting. Do not sip.  This drink was given to you during your hospital  pre-op appointment visit.  Nothing else to drink after completing the  12 oz bottle of G2.         If you have questions, please contact your surgeon's office.     Take these medicines the morning of surgery with A SIP OF WATER:  amLODipine (NORVASC) atorvastatin (LIPITOR)  carvedilol (COREG) ondansetron (ZOFRAN) - if needed oxycodone - if needed   Follow your surgeon's instructions on when to stop Aspirin.  If no instructions were given by your surgeon then you will need to call the office to get those instructions.     As of today, STOP taking any Aleve, Naproxen, Ibuprofen, Motrin, Advil, Goody's, BC's, all herbal medications, fish oil, and all vitamins.   WHAT DO I DO ABOUT MY DIABETES MEDICATION?  Do not Semaglutide (RYBELSUS) the morning of surgery  Take insulin degludec (TRESIBA FLEXTOUCH) 50% of your regular dose - 5 units the night before surgery (or the morning of surgery)   HOW TO MANAGE YOUR DIABETES BEFORE AND AFTER  SURGERY  How do I manage my blood sugar before surgery? Check your blood sugar at least 4 times a day, starting 2 days before surgery, to make sure that the level is not too high or low.  Check your blood sugar the morning of your surgery when you wake up and every 2 hours until you get to the Short Stay unit.  If your blood sugar is less than 70 mg/dL, you will need to treat for low blood sugar: Do not take insulin. Treat a low blood sugar (less than 70 mg/dL) with  cup of clear juice (cranberry or apple), 4 glucose tablets, OR glucose gel. Recheck blood sugar in 15 minutes after treatment (to make sure it is greater than 70 mg/dL). If your blood sugar is not greater than 70 mg/dL on recheck, call [phone] for further instructions. Report your blood sugar to the short stay nurse when you get to Short Stay.  If you are admitted to the hospital after surgery: Your blood sugar will be checked by the staff and you will probably be given insulin after surgery (instead of oral diabetes medicines) to make sure you have good blood sugar levels. The goal for blood sugar control after surgery is 80-180 mg/dL.    After your COVID test   You are not required to quarantine however you are required to wear a well-fitting mask when you are out and around people not in your household.  If your  mask becomes wet or soiled, replace with a new one.  Wash your hands often with soap and water for 20 seconds or clean your hands with an alcohol-based hand sanitizer that contains at least 60% alcohol.  Do not share personal items.  Notify your provider: if you are in close contact with someone who has COVID  or if you develop a fever of 100.4 or greater, sneezing, cough, sore throat, shortness of breath or body aches.    The day of surgery:          Do not wear jewelry or makeup Do not wear lotions, powders, perfumes, or deodorant. Do not shave 48 hours prior to surgery.  Do not bring valuables to  the hospital.             [facility] is not responsible for any belongings or valuables.  Do NOT Smoke (Tobacco/Vaping)  24 hours prior to your procedure  If you use a CPAP at night, you may bring your mask for your overnight stay.   Contacts, glasses, hearing aids, dentures or partials may not be worn into surgery, please bring cases for these belongings   For patients admitted to the hospital, discharge time will be determined by your treatment team.   Patients discharged the day of surgery will not be allowed to drive home, and someone needs to stay with them for 24 hours.  NO VISITORS WILL BE ALLOWED IN PRE-OP WHERE PATIENTS ARE PREPPED FOR SURGERY.  ONLY 1 SUPPORT PERSON MAY BE PRESENT IN THE WAITING ROOM WHILE YOU ARE IN SURGERY.  IF YOU ARE TO BE ADMITTED, ONCE YOU ARE IN YOUR ROOM YOU WILL BE ALLOWED TWO (2) VISITORS. 1 (ONE) VISITOR MAY STAY OVERNIGHT BUT MUST ARRIVE TO THE ROOM BY 8pm.  Minor children may have two parents present. Special consideration for safety and communication needs will be reviewed on a case by case basis.  Special instructions:    Oral Hygiene is also important to reduce your risk of infection.  Remember - BRUSH YOUR TEETH THE MORNING OF SURGERY WITH YOUR REGULAR TOOTHPASTE   [facility]- Preparing For Surgery  Before surgery, you can play an important role. Because skin is not sterile, your skin needs to be as free of germs as possible. You can reduce the number of germs on your skin by washing with CHG (chlorahexidine gluconate) Soap before surgery.  CHG is an antiseptic cleaner which kills germs and bonds with the skin to continue killing germs even after washing.     Please do not use if you have an allergy to CHG or antibacterial soaps. If your skin becomes reddened/irritated stop using the CHG.  Do not shave (including legs and underarms) for at least 48 hours prior to first CHG shower. It is OK to shave your face.  Please follow these instructions  carefully.     Shower the NIGHT BEFORE SURGERY and the MORNING OF SURGERY with CHG Soap.   If you chose to wash your hair, wash your hair first as usual with your normal shampoo. After you shampoo, rinse your hair and body thoroughly to remove the shampoo.  Then [employer] and genitals (private parts) with your normal soap and rinse thoroughly to remove soap.  After that Use CHG Soap as you would any other liquid soap. You can apply CHG directly to the skin and wash gently with a scrungie or a clean washcloth.   Apply the CHG Soap to your body ONLY FROM  THE NECK DOWN.  Do not use on open wounds or open sores. Avoid contact with your eyes, ears, mouth and genitals (private parts). Wash Face and genitals (private parts)  with your normal soap.   Wash thoroughly, paying special attention to the area where your surgery will be performed.  Thoroughly rinse your body with warm water from the neck down.  DO NOT shower/wash with your normal soap after using and rinsing off the CHG Soap.  Pat yourself dry with a CLEAN TOWEL.  Wear CLEAN PAJAMAS to bed the night before surgery  Place CLEAN SHEETS on your bed the night before your surgery  DO NOT SLEEP WITH PETS.   Day of Surgery:  Take a shower with CHG soap. Wear Clean/Comfortable clothing the morning of surgery Do not apply any deodorants/lotions.   Remember to brush your teeth WITH YOUR REGULAR TOOTHPASTE.   Please read over the following fact sheets that you were given.

## 2021-08-02 ENCOUNTER — Other Ambulatory Visit (HOSPITAL_COMMUNITY)
Admission: RE | Admit: 2021-08-02 | Discharge: 2021-08-02 | Disposition: A | Discharge status: Home / Self Care | Payer: Medicare Other | Source: Ambulatory Visit | Attending: Orthopaedic Surgery | Admitting: Orthopaedic Surgery

## 2021-08-02 DIAGNOSIS — Z20822 Contact with and (suspected) exposure to covid-19: Secondary | ICD-10-CM | POA: Insufficient documentation

## 2021-08-02 DIAGNOSIS — Z01812 Encounter for preprocedural laboratory examination: Secondary | ICD-10-CM | POA: Diagnosis present

## 2021-08-02 DIAGNOSIS — Z01818 Encounter for other preprocedural examination: Secondary | ICD-10-CM

## 2021-08-02 LAB — SARS CORONAVIRUS 2 (TAT 6-24 HRS): SARS Coronavirus 2: NEGATIVE

## 2021-08-03 ENCOUNTER — Telehealth: Payer: Self-pay | Admitting: Orthopaedic Surgery

## 2021-08-03 MED ORDER — TRANEXAMIC ACID 1000 MG/10ML IV SOLN
2000.0000 mg | INTRAVENOUS | Status: DC
  Filled 2021-08-03: qty 20

## 2021-08-03 NOTE — Telephone Encounter (Signed)
Patient's daughter called regarding her mother's surgery time on Monday 08-06-21 and wanted to know if there is anyway to change her surgery time to be the first patient on Monday morning. I called the daughter back at [phone] but she did not answer and the voice mail is full.

## 2021-08-06 ENCOUNTER — Ambulatory Visit (HOSPITAL_COMMUNITY): Payer: Medicare Other | Admitting: Anesthesiology

## 2021-08-06 ENCOUNTER — Ambulatory Visit (HOSPITAL_COMMUNITY): Payer: Medicare Other

## 2021-08-06 ENCOUNTER — Encounter (HOSPITAL_COMMUNITY)
Admission: RE | Disposition: A | Discharge status: Home / Self Care | Payer: Self-pay | Source: Home / Self Care | Attending: Orthopaedic Surgery

## 2021-08-06 ENCOUNTER — Observation Stay (HOSPITAL_COMMUNITY): Payer: Medicare Other

## 2021-08-06 ENCOUNTER — Encounter (HOSPITAL_COMMUNITY): Payer: Self-pay | Admitting: Orthopaedic Surgery

## 2021-08-06 ENCOUNTER — Observation Stay (HOSPITAL_COMMUNITY)
Admission: RE | Admit: 2021-08-06 | Discharge: 2021-08-07 | Disposition: A | Discharge status: Home / Self Care | Payer: Medicare Other | Attending: Orthopaedic Surgery | Admitting: Orthopaedic Surgery

## 2021-08-06 ENCOUNTER — Other Ambulatory Visit: Payer: Self-pay

## 2021-08-06 DIAGNOSIS — M1611 Unilateral primary osteoarthritis, right hip: Secondary | ICD-10-CM | POA: Diagnosis not present

## 2021-08-06 DIAGNOSIS — Z7982 Long term (current) use of aspirin: Secondary | ICD-10-CM | POA: Diagnosis not present

## 2021-08-06 DIAGNOSIS — N183 Chronic kidney disease, stage 3 unspecified: Secondary | ICD-10-CM | POA: Insufficient documentation

## 2021-08-06 DIAGNOSIS — Z96649 Presence of unspecified artificial hip joint: Secondary | ICD-10-CM

## 2021-08-06 DIAGNOSIS — Z794 Long term (current) use of insulin: Secondary | ICD-10-CM | POA: Diagnosis not present

## 2021-08-06 DIAGNOSIS — Z419 Encounter for procedure for purposes other than remedying health state, unspecified: Secondary | ICD-10-CM

## 2021-08-06 DIAGNOSIS — I129 Hypertensive chronic kidney disease with stage 1 through stage 4 chronic kidney disease, or unspecified chronic kidney disease: Secondary | ICD-10-CM | POA: Diagnosis not present

## 2021-08-06 DIAGNOSIS — Z96641 Presence of right artificial hip joint: Secondary | ICD-10-CM

## 2021-08-06 DIAGNOSIS — E1122 Type 2 diabetes mellitus with diabetic chronic kidney disease: Secondary | ICD-10-CM | POA: Diagnosis not present

## 2021-08-06 DIAGNOSIS — Z79899 Other long term (current) drug therapy: Secondary | ICD-10-CM | POA: Insufficient documentation

## 2021-08-06 DIAGNOSIS — Z87891 Personal history of nicotine dependence: Secondary | ICD-10-CM | POA: Diagnosis not present

## 2021-08-06 DIAGNOSIS — Z7984 Long term (current) use of oral hypoglycemic drugs: Secondary | ICD-10-CM | POA: Insufficient documentation

## 2021-08-06 DIAGNOSIS — M87051 Idiopathic aseptic necrosis of right femur: Secondary | ICD-10-CM

## 2021-08-06 HISTORY — PX: TOTAL HIP ARTHROPLASTY: SHX124

## 2021-08-06 LAB — GLUCOSE, CAPILLARY
Glucose-Capillary: 86 mg/dL (ref 70–99)
Glucose-Capillary: 61 mg/dL — ABNORMAL LOW (ref 70–99)
Glucose-Capillary: 67 mg/dL — ABNORMAL LOW (ref 70–99)
Glucose-Capillary: 76 mg/dL (ref 70–99)
Glucose-Capillary: 66 mg/dL — ABNORMAL LOW (ref 70–99)
Glucose-Capillary: 98 mg/dL (ref 70–99)

## 2021-08-06 LAB — ABO/RH: ABO/RH(D): O POS

## 2021-08-06 SURGERY — ARTHROPLASTY, HIP, TOTAL, ANTERIOR APPROACH
Anesthesia: Spinal | Site: Hip | Laterality: Right

## 2021-08-06 MED ORDER — PHENYLEPHRINE 40 MCG/ML (10ML) SYRINGE FOR IV PUSH (FOR BLOOD PRESSURE SUPPORT)
PREFILLED_SYRINGE | INTRAVENOUS | Status: AC
  Filled 2021-08-06: qty 10

## 2021-08-06 MED ORDER — METHOCARBAMOL 500 MG PO TABS
500.0000 mg | ORAL_TABLET | Freq: Four times a day (QID) | ORAL | Status: DC | PRN
  Administered 2021-08-06 – 2021-08-07 (×2): 500 mg via ORAL
  Filled 2021-08-06 (×2): qty 1

## 2021-08-06 MED ORDER — VANCOMYCIN HCL 1000 MG IV SOLR
INTRAVENOUS | Status: AC
  Filled 2021-08-06: qty 20

## 2021-08-06 MED ORDER — OXYCODONE HCL 5 MG/5ML PO SOLN: 5.0000 mg | Freq: Once | ORAL | Status: DC | PRN

## 2021-08-06 MED ORDER — FENTANYL CITRATE (PF) 100 MCG/2ML IJ SOLN
INTRAMUSCULAR | Status: DC | PRN
  Administered 2021-08-06 (×2): 50 ug via INTRAVENOUS

## 2021-08-06 MED ORDER — SODIUM CHLORIDE 0.9 % IR SOLN
Status: DC | PRN
  Administered 2021-08-06: 1000 mL

## 2021-08-06 MED ORDER — SORBITOL 70 % SOLN: 30.0000 mL | Freq: Every day | Status: DC | PRN

## 2021-08-06 MED ORDER — FENTANYL CITRATE (PF) 250 MCG/5ML IJ SOLN
INTRAMUSCULAR | Status: AC
  Filled 2021-08-06: qty 5

## 2021-08-06 MED ORDER — FENTANYL CITRATE (PF) 100 MCG/2ML IJ SOLN: 25.0000 ug | INTRAMUSCULAR | Status: DC | PRN

## 2021-08-06 MED ORDER — TRANEXAMIC ACID-NACL 1000-0.7 MG/100ML-% IV SOLN
1000.0000 mg | INTRAVENOUS | Status: AC
  Administered 2021-08-06: 1000 mg via INTRAVENOUS
  Filled 2021-08-06: qty 100

## 2021-08-06 MED ORDER — SODIUM CHLORIDE 0.9 % IV SOLN: INTRAVENOUS | Status: DC

## 2021-08-06 MED ORDER — PHENYLEPHRINE 40 MCG/ML (10ML) SYRINGE FOR IV PUSH (FOR BLOOD PRESSURE SUPPORT)
PREFILLED_SYRINGE | INTRAVENOUS | Status: DC | PRN
  Administered 2021-08-06: 120 ug via INTRAVENOUS
  Administered 2021-08-06: 80 ug via INTRAVENOUS
  Administered 2021-08-06 (×2): 120 ug via INTRAVENOUS

## 2021-08-06 MED ORDER — INSULIN DEGLUDEC 100 UNIT/ML ~~LOC~~ SOPN: 10.0000 [IU] | PEN_INJECTOR | Freq: Every day | SUBCUTANEOUS | Status: DC

## 2021-08-06 MED ORDER — ASPIRIN 81 MG PO CHEW
81.0000 mg | CHEWABLE_TABLET | Freq: Two times a day (BID) | ORAL | Status: DC
  Administered 2021-08-06 – 2021-08-07 (×2): 81 mg via ORAL
  Filled 2021-08-06 (×2): qty 1

## 2021-08-06 MED ORDER — 0.9 % SODIUM CHLORIDE (POUR BTL) OPTIME
TOPICAL | Status: DC | PRN
  Administered 2021-08-06: 1000 mL

## 2021-08-06 MED ORDER — PROPOFOL 10 MG/ML IV BOLUS
INTRAVENOUS | Status: DC | PRN
  Administered 2021-08-06: 20 mg via INTRAVENOUS

## 2021-08-06 MED ORDER — ONDANSETRON HCL 4 MG PO TABS: 4.0000 mg | ORAL_TABLET | Freq: Four times a day (QID) | ORAL | Status: DC | PRN

## 2021-08-06 MED ORDER — ACETAMINOPHEN 325 MG PO TABS
325.0000 mg | ORAL_TABLET | Freq: Four times a day (QID) | ORAL | Status: DC | PRN
  Administered 2021-08-07: 650 mg via ORAL
  Filled 2021-08-06: qty 2

## 2021-08-06 MED ORDER — ACETAMINOPHEN 500 MG PO TABS
1000.0000 mg | ORAL_TABLET | Freq: Four times a day (QID) | ORAL | Status: AC
Start: 2021-08-06 — End: 2021-08-07
  Administered 2021-08-06 – 2021-08-07 (×3): 1000 mg via ORAL
  Filled 2021-08-06 (×3): qty 2

## 2021-08-06 MED ORDER — LACTATED RINGERS IV SOLN: INTRAVENOUS | Status: DC

## 2021-08-06 MED ORDER — OXYCODONE HCL 5 MG PO TABS: 10.0000 mg | ORAL_TABLET | ORAL | Status: DC | PRN

## 2021-08-06 MED ORDER — METOCLOPRAMIDE HCL 5 MG/ML IJ SOLN: 5.0000 mg | Freq: Three times a day (TID) | INTRAMUSCULAR | Status: DC | PRN

## 2021-08-06 MED ORDER — BUPIVACAINE IN DEXTROSE 0.75-8.25 % IT SOLN
INTRATHECAL | Status: DC | PRN
  Administered 2021-08-06: 1.6 mL via INTRATHECAL

## 2021-08-06 MED ORDER — ONDANSETRON HCL 4 MG/2ML IJ SOLN: 4.0000 mg | Freq: Four times a day (QID) | INTRAMUSCULAR | Status: DC | PRN

## 2021-08-06 MED ORDER — INSULIN GLARGINE-YFGN 100 UNIT/ML ~~LOC~~ SOLN
10.0000 [IU] | Freq: Every day | SUBCUTANEOUS | Status: DC
  Administered 2021-08-07: 10 [IU] via SUBCUTANEOUS
  Filled 2021-08-06: qty 0.1

## 2021-08-06 MED ORDER — INSULIN PEN NEEDLE 32G X 4 MM MISC: 1.0000 | Freq: Every day | Status: DC

## 2021-08-06 MED ORDER — POVIDONE-IODINE 10 % EX SWAB
2.0000 "application " | Freq: Once | CUTANEOUS | Status: AC
  Administered 2021-08-06: 2 via TOPICAL

## 2021-08-06 MED ORDER — OXYCODONE HCL 5 MG PO TABS
5.0000 mg | ORAL_TABLET | ORAL | Status: DC | PRN
  Administered 2021-08-06 – 2021-08-07 (×3): 5 mg via ORAL
  Filled 2021-08-06 (×4): qty 2

## 2021-08-06 MED ORDER — EPHEDRINE SULFATE-NACL 50-0.9 MG/10ML-% IV SOSY
PREFILLED_SYRINGE | INTRAVENOUS | Status: DC | PRN
  Administered 2021-08-06 (×2): 5 mg via INTRAVENOUS
  Administered 2021-08-06: 10 mg via INTRAVENOUS

## 2021-08-06 MED ORDER — TRANEXAMIC ACID-NACL 1000-0.7 MG/100ML-% IV SOLN
1000.0000 mg | Freq: Once | INTRAVENOUS | Status: AC
  Administered 2021-08-06: 1000 mg via INTRAVENOUS
  Filled 2021-08-06: qty 100

## 2021-08-06 MED ORDER — BUPIVACAINE-MELOXICAM ER 400-12 MG/14ML IJ SOLN
INTRAMUSCULAR | Status: AC
  Filled 2021-08-06: qty 1

## 2021-08-06 MED ORDER — OXYCODONE HCL 5 MG PO TABS
5.0000 mg | ORAL_TABLET | Freq: Once | ORAL | Status: DC | PRN
Start: 2021-08-06 — End: 2021-08-06

## 2021-08-06 MED ORDER — DOCUSATE SODIUM 100 MG PO CAPS
100.0000 mg | ORAL_CAPSULE | Freq: Two times a day (BID) | ORAL | Status: DC
  Administered 2021-08-06 – 2021-08-07 (×2): 100 mg via ORAL
  Filled 2021-08-06 (×2): qty 1

## 2021-08-06 MED ORDER — POLYETHYLENE GLYCOL 3350 17 G PO PACK
17.0000 g | PACK | Freq: Every day | ORAL | Status: DC
  Administered 2021-08-07: 17 g via ORAL
  Filled 2021-08-06: qty 1

## 2021-08-06 MED ORDER — CEFAZOLIN SODIUM-DEXTROSE 2-4 GM/100ML-% IV SOLN
2.0000 g | INTRAVENOUS | Status: AC
  Administered 2021-08-06: 2 g via INTRAVENOUS
  Filled 2021-08-06: qty 100

## 2021-08-06 MED ORDER — MENTHOL 3 MG MT LOZG: 1.0000 | LOZENGE | OROMUCOSAL | Status: DC | PRN

## 2021-08-06 MED ORDER — TRANEXAMIC ACID 1000 MG/10ML IV SOLN
INTRAVENOUS | Status: DC | PRN
  Administered 2021-08-06: 2000 mg via TOPICAL

## 2021-08-06 MED ORDER — DEXAMETHASONE SODIUM PHOSPHATE 10 MG/ML IJ SOLN
10.0000 mg | Freq: Once | INTRAMUSCULAR | Status: AC
  Administered 2021-08-07: 10 mg via INTRAVENOUS
  Filled 2021-08-06: qty 1

## 2021-08-06 MED ORDER — PHENOL 1.4 % MT LIQD: 1.0000 | OROMUCOSAL | Status: DC | PRN

## 2021-08-06 MED ORDER — ALUM & MAG HYDROXIDE-SIMETH 200-200-20 MG/5ML PO SUSP: 30.0000 mL | ORAL | Status: DC | PRN

## 2021-08-06 MED ORDER — ORAL CARE MOUTH RINSE: 15.0000 mL | Freq: Once | OROMUCOSAL | Status: AC

## 2021-08-06 MED ORDER — CEFAZOLIN SODIUM-DEXTROSE 2-4 GM/100ML-% IV SOLN
2.0000 g | Freq: Four times a day (QID) | INTRAVENOUS | Status: AC
Start: 2021-08-06 — End: 2021-08-06
  Administered 2021-08-06 (×2): 2 g via INTRAVENOUS
  Filled 2021-08-06 (×2): qty 100

## 2021-08-06 MED ORDER — IRRISEPT - 450ML BOTTLE WITH 0.05% CHG IN STERILE WATER, USP 99.95% OPTIME
TOPICAL | Status: DC | PRN
  Administered 2021-08-06: 450 mL via TOPICAL

## 2021-08-06 MED ORDER — ONDANSETRON HCL 4 MG/2ML IJ SOLN
INTRAMUSCULAR | Status: DC | PRN
  Administered 2021-08-06: 4 mg via INTRAVENOUS

## 2021-08-06 MED ORDER — CARVEDILOL 6.25 MG PO TABS
6.2500 mg | ORAL_TABLET | Freq: Two times a day (BID) | ORAL | Status: DC
  Administered 2021-08-06 – 2021-08-07 (×2): 6.25 mg via ORAL
  Filled 2021-08-06 (×2): qty 1

## 2021-08-06 MED ORDER — METHOCARBAMOL 1000 MG/10ML IJ SOLN
500.0000 mg | Freq: Four times a day (QID) | INTRAVENOUS | Status: DC | PRN
  Filled 2021-08-06: qty 5

## 2021-08-06 MED ORDER — CHLORHEXIDINE GLUCONATE 0.12 % MT SOLN
15.0000 mL | Freq: Once | OROMUCOSAL | Status: AC
  Administered 2021-08-06: 15 mL via OROMUCOSAL
  Filled 2021-08-06: qty 15

## 2021-08-06 MED ORDER — EPHEDRINE 5 MG/ML INJ
INTRAVENOUS | Status: AC
  Filled 2021-08-06: qty 5

## 2021-08-06 MED ORDER — HYDROMORPHONE HCL 1 MG/ML IJ SOLN: 0.5000 mg | INTRAMUSCULAR | Status: DC | PRN

## 2021-08-06 MED ORDER — PROPOFOL 500 MG/50ML IV EMUL
INTRAVENOUS | Status: DC | PRN
  Administered 2021-08-06: 100 ug/kg/min via INTRAVENOUS

## 2021-08-06 MED ORDER — DIPHENHYDRAMINE HCL 12.5 MG/5ML PO ELIX
25.0000 mg | ORAL_SOLUTION | ORAL | Status: DC | PRN
  Filled 2021-08-06: qty 10

## 2021-08-06 MED ORDER — PANTOPRAZOLE SODIUM 40 MG PO TBEC
40.0000 mg | DELAYED_RELEASE_TABLET | Freq: Every day | ORAL | Status: DC
  Administered 2021-08-07: 40 mg via ORAL
  Filled 2021-08-06: qty 1

## 2021-08-06 MED ORDER — ACETAMINOPHEN 500 MG PO TABS
1000.0000 mg | ORAL_TABLET | Freq: Once | ORAL | Status: AC
  Administered 2021-08-06: 1000 mg via ORAL
  Filled 2021-08-06: qty 2

## 2021-08-06 MED ORDER — LOSARTAN POTASSIUM 50 MG PO TABS
50.0000 mg | ORAL_TABLET | Freq: Every day | ORAL | Status: DC
  Administered 2021-08-07: 50 mg via ORAL
  Filled 2021-08-06: qty 1

## 2021-08-06 MED ORDER — BUPIVACAINE-MELOXICAM ER 400-12 MG/14ML IJ SOLN
INTRAMUSCULAR | Status: DC | PRN
  Administered 2021-08-06: 400 mg

## 2021-08-06 MED ORDER — OXYCODONE HCL ER 10 MG PO T12A
10.0000 mg | EXTENDED_RELEASE_TABLET | Freq: Two times a day (BID) | ORAL | Status: DC
  Administered 2021-08-06 – 2021-08-07 (×2): 10 mg via ORAL
  Filled 2021-08-06 (×2): qty 1

## 2021-08-06 MED ORDER — AMLODIPINE BESYLATE 5 MG PO TABS
10.0000 mg | ORAL_TABLET | Freq: Every day | ORAL | Status: DC
  Administered 2021-08-07: 10 mg via ORAL
  Filled 2021-08-06: qty 2

## 2021-08-06 MED ORDER — METOCLOPRAMIDE HCL 5 MG PO TABS: 5.0000 mg | ORAL_TABLET | Freq: Three times a day (TID) | ORAL | Status: DC | PRN

## 2021-08-06 MED ORDER — VANCOMYCIN HCL 1 G IV SOLR
INTRAVENOUS | Status: DC | PRN
  Administered 2021-08-06: 1000 mg

## 2021-08-06 SURGICAL SUPPLY — 68 items
ADH SKN CLS APL DERMABOND .7 (GAUZE/BANDAGES/DRESSINGS) ×1
BAG COUNTER SPONGE SURGICOUNT (BAG) ×2 IMPLANT
BAG DECANTER FOR FLEXI CONT (MISCELLANEOUS) ×2 IMPLANT
BAG SPNG CNTER NS LX DISP (BAG) ×1
CELLS DAT CNTRL 66122 CELL SVR (MISCELLANEOUS) IMPLANT
COOLER ICEMAN CLASSIC (MISCELLANEOUS) ×1 IMPLANT
COVER PERINEAL POST (MISCELLANEOUS) ×2 IMPLANT
COVER SURGICAL LIGHT HANDLE (MISCELLANEOUS) ×2 IMPLANT
DERMABOND ADVANCED (GAUZE/BANDAGES/DRESSINGS) ×1
DERMABOND ADVANCED .7 DNX12 (GAUZE/BANDAGES/DRESSINGS) IMPLANT
DRAPE C-ARM 42X72 X-RAY (DRAPES) ×2 IMPLANT
DRAPE POUCH INSTRU U-SHP 10X18 (DRAPES) ×2 IMPLANT
DRAPE STERI IOBAN 125X83 (DRAPES) ×2 IMPLANT
DRAPE U-SHAPE 47X51 STRL (DRAPES) ×4 IMPLANT
DRESSING AQUACEL AG SP 3.5X10 (GAUZE/BANDAGES/DRESSINGS) IMPLANT
DRSG AQUACEL AG ADV 3.5X10 (GAUZE/BANDAGES/DRESSINGS) ×2 IMPLANT
DRSG AQUACEL AG SP 3.5X10 (GAUZE/BANDAGES/DRESSINGS) ×2
DURAPREP 26ML APPLICATOR (WOUND CARE) ×4 IMPLANT
ELECT BLADE 4.0 EZ CLEAN MEGAD (MISCELLANEOUS) ×2
ELECT REM PT RETURN 9FT ADLT (ELECTROSURGICAL) ×2
ELECTRODE BLDE 4.0 EZ CLN MEGD (MISCELLANEOUS) ×1 IMPLANT
ELECTRODE REM PT RTRN 9FT ADLT (ELECTROSURGICAL) ×1 IMPLANT
GLOVE SURG LTX SZ7 (GLOVE) ×4 IMPLANT
GLOVE SURG SYN 7.5  E (GLOVE) ×8
GLOVE SURG SYN 7.5 E (GLOVE) ×4 IMPLANT
GLOVE SURG SYN 7.5 PF PI (GLOVE) ×4 IMPLANT
GLOVE SURG UNDER POLY LF SZ7 (GLOVE) ×40 IMPLANT
GLOVE SURG UNDER POLY LF SZ7.5 (GLOVE) ×4 IMPLANT
GOWN STRL REIN XL XLG (GOWN DISPOSABLE) ×2 IMPLANT
GOWN STRL REUS W/ TWL LRG LVL3 (GOWN DISPOSABLE) IMPLANT
GOWN STRL REUS W/ TWL XL LVL3 (GOWN DISPOSABLE) ×1 IMPLANT
GOWN STRL REUS W/TWL LRG LVL3 (GOWN DISPOSABLE)
GOWN STRL REUS W/TWL XL LVL3 (GOWN DISPOSABLE) ×2
HANDPIECE INTERPULSE COAX TIP (DISPOSABLE) ×2
HEAD M SROM 36MM PLUS 1.5 (Hips) IMPLANT
HOOD PEEL AWAY FLYTE STAYCOOL (MISCELLANEOUS) ×4 IMPLANT
IV NS IRRIG 3000ML ARTHROMATIC (IV SOLUTION) ×2 IMPLANT
JET LAVAGE IRRISEPT WOUND (IRRIGATION / IRRIGATOR) ×2
KIT BASIN OR (CUSTOM PROCEDURE TRAY) ×2 IMPLANT
LAVAGE JET IRRISEPT WOUND (IRRIGATION / IRRIGATOR) ×1 IMPLANT
LINER NEUTRAL 52X36MM PLUS 4 (Liner) ×1 IMPLANT
MARKER SKIN DUAL TIP RULER LAB (MISCELLANEOUS) ×2 IMPLANT
NDL SPNL 18GX3.5 QUINCKE PK (NEEDLE) ×1 IMPLANT
NEEDLE SPNL 18GX3.5 QUINCKE PK (NEEDLE) ×2 IMPLANT
PACK TOTAL JOINT (CUSTOM PROCEDURE TRAY) ×2 IMPLANT
PACK UNIVERSAL I (CUSTOM PROCEDURE TRAY) ×2 IMPLANT
PAD COLD SHLDR WRAP-ON (PAD) ×1 IMPLANT
PIN SECTOR W/GRIP ACE CUP 52MM (Hips) ×1 IMPLANT
RETRACTOR WND ALEXIS 18 MED (MISCELLANEOUS) IMPLANT
RTRCTR WOUND ALEXIS 18CM MED (MISCELLANEOUS)
SAW OSC TIP CART 19.5X105X1.3 (SAW) ×2 IMPLANT
SET HNDPC FAN SPRY TIP SCT (DISPOSABLE) ×1 IMPLANT
SROM M HEAD 36MM PLUS 1.5 (Hips) ×2 IMPLANT
STAPLER VISISTAT 35W (STAPLE) IMPLANT
STEM FEMORAL SZ 5MM STD ACTIS (Stem) ×1 IMPLANT
SUT ETHIBOND 2 V 37 (SUTURE) ×2 IMPLANT
SUT VIC AB 0 CT1 27 (SUTURE) ×2
SUT VIC AB 0 CT1 27XBRD ANBCTR (SUTURE) ×1 IMPLANT
SUT VIC AB 1 CTX 36 (SUTURE) ×2
SUT VIC AB 1 CTX36XBRD ANBCTR (SUTURE) ×1 IMPLANT
SUT VIC AB 2-0 CT1 27 (SUTURE) ×4
SUT VIC AB 2-0 CT1 TAPERPNT 27 (SUTURE) ×2 IMPLANT
SYR 50ML LL SCALE MARK (SYRINGE) ×2 IMPLANT
TOWEL GREEN STERILE (TOWEL DISPOSABLE) ×2 IMPLANT
TRAY CATH 16FR W/PLASTIC CATH (SET/KITS/TRAYS/PACK) IMPLANT
TRAY FOLEY W/BAG SLVR 16FR (SET/KITS/TRAYS/PACK) ×2
TRAY FOLEY W/BAG SLVR 16FR ST (SET/KITS/TRAYS/PACK) ×1 IMPLANT
YANKAUER SUCT BULB TIP NO VENT (SUCTIONS) ×2 IMPLANT

## 2021-08-06 NOTE — H&P (Signed)
PREOPERATIVE H&P  Chief Complaint: right hip avascular necrosis  HPI: Catherine Mcconnell is a 75 y.o. female who presents for surgical treatment of right hip avascular necrosis.  She denies any changes in medical history.  Past Medical History:  Diagnosis Date   Anemia    on meds   Arthritis    hips/wrists/back   Cataract    sx-bilateral   Chronic kidney disease    stage 3   Diabetes mellitus without complication (HCC)    on meds   Hypertension    on meds   Iron deficiency    on oral iron supplements, has not required transfusion   Mixed hyperlipidemia    on meds   OSA on CPAP    compliant   Sleep apnea    uses CPAP   Thyroid nodule    removed-not on meds at this time (12/01/2020)   Past Surgical History:  Procedure Laterality Date   ABDOMINAL HYSTERECTOMY  1996   MASS EXCISION Right 2017   right arm mass removal   THYROID SURGERY     left side removal benign nodule   Social History   Socioeconomic History   Marital status: Divorced    Spouse name: Not on file   Number of children: 6   Years of education: some college   Highest education level: Not on file  Occupational History   Occupation: Retired   Tobacco Use   Smoking status: Former    Packs/day: 0.25    Years: 1.00    Pack years: 0.25    Types: Cigarettes    Quit date: 1960    Years since quitting: 62.8   Smokeless tobacco: Never   Tobacco comments:    only socially in high school  Vaping Use   Vaping Use: Never used  Substance and Sexual Activity   Alcohol use: Not Currently   Drug use: Never   Sexual activity: Not on file  Other Topics Concern   Not on file  Social History Narrative   03/16/20 lives with dgtr Weber Cooks   Mother of 6   --Two children have passed, one from from prostate cancer and one from liver failure   Used to be a Quarry manager, Child psychotherapist   Recently moved back to [place] Determinants of Radio broadcast assistant Strain: Low Risk    Difficulty of Paying Living  Expenses: Not hard at all  Food Insecurity: No Food Insecurity   Worried About Charity fundraiser in the Last Year: Never true   Arboriculturist in the Last Year: Never true  Transportation Needs: No Transportation Needs   Lack of Transportation (Medical): No   Lack of Transportation (Non-Medical): No  Physical Activity: Insufficiently Active   Days of Exercise per Week: 5 days   Minutes of Exercise per Session: 20 min  Stress: No Stress Concern Present   Feeling of Stress : Not at all  Social Connections: Socially Isolated   Frequency of Communication with Friends and Family: Never   Frequency of Social Gatherings with Friends and Family: Never   Attends Religious Services: More than 4 times per year   Active Member of [employer] or Organizations: No   Attends Archivist Meetings: Never   Marital Status: Divorced   Family History  Problem Relation Age of Onset   Stroke Mother    Hypertension Mother    Heart disease Mother    Heart attack Father  Early death Father    Hypertension Father    Diabetes Sister    Early death Sister    Hypertension Sister    Stroke Sister    Prostate cancer Son    Cancer Son    Early death Son    Breast cancer Cousin    Diabetes Sister    Early death Sister    Hyperlipidemia Sister    Kidney disease Sister    Depression Daughter    Hypertension Daughter    Hyperlipidemia Daughter    Diabetes Sister    Hyperlipidemia Sister    Hypertension Sister    Early death Brother    Arthritis Brother    Heart attack Brother    Heart disease Brother    Arthritis Brother    Diabetes Sister    Colon polyps Neg Hx    Colon cancer Neg Hx    Esophageal cancer Neg Hx    Rectal cancer Neg Hx    Stomach cancer Neg Hx    Allergies  Allergen Reactions   Lisinopril Rash   Metformin And Related Swelling   Prior to Admission medications   Medication Sig Start Date End Date Taking? Authorizing Provider  amLODipine (NORVASC) 10 MG tablet Take  1 tablet (10 mg total) by mouth daily. 07/11/21  Yes Inda Coke, PA  aspirin EC 81 MG tablet Take 1 tablet (81 mg total) by mouth 2 (two) times daily. To be taken after surgery to prevent blood clots 07/30/21   Aundra Dubin, PA-C  atorvastatin (LIPITOR) 80 MG tablet Take 1 tablet (80 mg total) by mouth daily. 06/15/21  Yes Patwardhan, Manish J, MD  carvedilol (COREG) 6.25 MG tablet TAKE 1 TABLET(6.25 MG) BY MOUTH TWICE DAILY WITH A MEAL 07/11/21  Yes Inda Coke, PA  cephALEXin (KEFLEX) 500 MG capsule Take 1 capsule (500 mg total) by mouth 4 (four) times daily. To be taken after surgery 07/30/21   Aundra Dubin, PA-C  docusate sodium (COLACE) 100 MG capsule Take 1 capsule (100 mg total) by mouth daily as needed. 07/30/21 07/30/22  Aundra Dubin, PA-C  donepezil (ARICEPT ODT) 10 MG disintegrating tablet Take 1 tablet (10 mg total) by mouth at bedtime. 04/23/21  Yes Ward Givens, NP  ferrous sulfate (IRON SUPPLEMENT) 325 (65 FE) MG tablet Take 1 tablet (325 mg total) by mouth 2 (two) times daily with a meal. 08/13/18  Yes Inda Coke, PA  gabapentin (NEURONTIN) 100 MG capsule Take 3 capsules by mouth every day Patient taking differently: Take 300 mg by mouth at bedtime. 06/11/21  Yes Worley, [name], PA  insulin degludec (TRESIBA FLEXTOUCH) 100 UNIT/ML FlexTouch Pen Inject 10 Units into the skin daily. 07/12/21  Yes Shamleffer, Melanie Crazier, MD  losartan (COZAAR) 50 MG tablet Take 1 tablet (50 mg total) by mouth daily. 07/16/21  Yes Worley, Samantha, PA  LUMIGAN 0.01 % SOLN Place 1 drop into both eyes every evening. 09/15/18  Yes [provider]  methocarbamol (ROBAXIN) 500 MG tablet Take 1 tablet (500 mg total) by mouth 2 (two) times daily as needed. To be taken after surgery 07/30/21   Aundra Dubin, PA-C  ondansetron (ZOFRAN) 4 MG tablet Take 1 tablet (4 mg total) by mouth every 8 (eight) hours as needed for nausea or vomiting. 07/30/21   Aundra Dubin, PA-C   oxyCODONE-acetaminophen (PERCOCET) 5-325 MG tablet Take 1-2 tablets by mouth every 6 (six) hours as needed. To be taken after surgery 07/30/21   Dwana Melena  L, PA-C  Semaglutide (RYBELSUS) 14 MG TABS Take 1 tablet by mouth daily. 07/12/21  Yes Shamleffer, Melanie Crazier, MD  furosemide (LASIX) 20 MG tablet Take 1 tablet (20 mg total) by mouth daily as needed for edema. Patient not taking: Reported on 07/27/2021 06/14/20   Inda Coke, PA  HYDROcodone-acetaminophen (NORCO) 5-325 MG tablet Take 1-2 tablets by mouth daily as needed. Patient not taking: Reported on 07/27/2021 05/02/21   Leandrew Koyanagi, MD  Insulin Pen Needle (TRUEPLUS PEN NEEDLES) 32G X 4 MM MISC 1 Device by Other route daily. 07/12/21   Shamleffer, Melanie Crazier, MD  Lancets (ONETOUCH ULTRASOFT) lancets CHECK TWICE PER DAY BEFORE  BREAKFAST AND DINNER 07/11/21   Inda Coke, PA  [facility] ULTRA test strip USE TO TEST BLOOD SUGARS  TWICE DAILY 01/29/21   Inda Coke, PA  oxycodone (OXY-IR) 5 MG capsule Take 1 capsule (5 mg total) by mouth every 8 (eight) hours as needed. 05/10/21   Vivi Barrack, MD     Positive ROS: All other systems have been reviewed and were otherwise negative with the exception of those mentioned in the HPI and as above.  Physical Exam: General: Alert, no acute distress Cardiovascular: No pedal edema Respiratory: No cyanosis, no use of accessory musculature GI: abdomen soft Skin: No lesions in the area of chief complaint Neurologic: Sensation intact distally Psychiatric: Patient is competent for consent with normal mood and affect Lymphatic: no lymphedema  MUSCULOSKELETAL: exam stable  Assessment: right hip avascular necrosis  Plan: Plan for Procedure(s): RIGHT TOTAL HIP ARTHROPLASTY ANTERIOR APPROACH  The risks benefits and alternatives were discussed with the patient including but not limited to the risks of nonoperative treatment, versus surgical intervention including infection,  bleeding, nerve injury,  blood clots, cardiopulmonary complications, morbidity, mortality, among others, and they were willing to proceed.   Preoperative templating of the joint replacement has been completed, documented, and submitted to the Operating Room personnel in order to optimize intra-operative equipment management.   Eduard Roux, MD 08/06/2021 11:23 AM

## 2021-08-06 NOTE — Evaluation (Signed)
Physical Therapy Evaluation Patient Details Name: Catherine Mcconnell MRN: [id number] DOB: 03-04-1946 Today's Date: 08/06/2021  History of Present Illness  Pt is a 75 y.o. F who presents for surgical treatment of right hip avascular necrosis. Now s/p right total hip arthroplasty anterior approach 08/06/2021. Significant PMH: CKD stage 3, DM, HTN.  Clinical Impression  Pt evaluated s/p right THA. Initiated HEP for right lower extremity strengthening/ROM and provided written handout. Pt requiring min assist for bed mobility and transfers to standing using a walker. Once standing, pt with a large amount of emesis, therefore, returned to supine. Suspect pt will progress well with mobility. Recommend follow up PT to address strengthening, transfer and gait training in order to maximize functional mobility.      Recommendations for follow up therapy are one component of a multi-disciplinary discharge planning process, led by the attending physician.  Recommendations may be updated based on patient status, additional functional criteria and insurance authorization.  Follow Up Recommendations Home health PT    Assistance Recommended at Discharge PRN  Functional Status Assessment Patient has had a recent decline in their functional status and demonstrates the ability to make significant improvements in function in a reasonable and predictable amount of time.  Equipment Recommendations  Rolling walker (2 wheels)    Recommendations for Other Services       Precautions / Restrictions Precautions Precautions: Fall Restrictions Weight Bearing Restrictions: No      Mobility  Bed Mobility Overal bed mobility: Needs Assistance Bed Mobility: Supine to Sit;Sit to Supine     Supine to sit: Min assist Sit to supine: Min assist   General bed mobility comments: MinA for RLE negotiation on and off the bed    Transfers Overall transfer level: Needs assistance Equipment used: Rolling walker (2  wheels) Transfers: Sit to/from Stand Sit to Stand: Min assist           General transfer comment: Light minA to rise to stand from edge of bed, decreased reliance through RLE    Ambulation/Gait                Stairs            Wheelchair Mobility    Modified Rankin (Stroke Patients Only)       Balance Overall balance assessment: Needs assistance Sitting-balance support: Feet supported Sitting balance-Leahy Scale: Good     Standing balance support: Bilateral upper extremity supported Standing balance-Leahy Scale: Poor Standing balance comment: reliant on RW                             Pertinent Vitals/Pain Pain Assessment: Faces Faces Pain Scale: Hurts even more Pain Location: R hip Pain Descriptors / Indicators: Discomfort;Guarding;Grimacing Pain Intervention(s): Limited activity within patient's tolerance;Monitored during session;Premedicated before session;Other (comment) (pt asked for PT to remove ice)    Home Living Family/patient expects to be discharged to:: Private residence Living Arrangements: Children Available Help at Discharge: Family Type of Home: House Home Access: Stairs to enter Entrance Stairs-Rails: Left Entrance Stairs-Number of Steps: [place]: Able to live on main level with bedroom/bathroom Home Equipment: Rollator (4 wheels);Tub bench;BSC      Prior Function Prior Level of Function : Independent/Modified Independent                     Hand Dominance        Extremity/Trunk Assessment   Upper Extremity Assessment  Upper Extremity Assessment: Overall WFL for tasks assessed    Lower Extremity Assessment Lower Extremity Assessment: RLE deficits/detail RLE Deficits / Details: Able to perform limited heel slide, ankle dorsiflexion WFL    Cervical / Trunk Assessment Cervical / Trunk Assessment: Normal  Communication   Communication: No difficulties  Cognition Arousal/Alertness:  Awake/alert Behavior During Therapy: WFL for tasks assessed/performed Overall Cognitive Status: Within Functional Limits for tasks assessed                                          General Comments      Exercises Total Joint Exercises Ankle Circles/Pumps: AROM;Both;20 reps;Supine Quad Sets: Right;10 reps;Supine   Assessment/Plan    PT Assessment Patient needs continued PT services  PT Problem List Decreased strength;Decreased activity tolerance;Decreased balance;Decreased mobility;Pain       PT Treatment Interventions DME instruction;Gait training;Functional mobility training;Stair training;Therapeutic activities;Therapeutic exercise;Balance training;Patient/family education    PT Goals (Current goals can be found in the Care Plan section)  Acute Rehab PT Goals Patient Stated Goal: less pain PT Goal Formulation: With patient Time For Goal Achievement: 08/20/21 Potential to Achieve Goals: Good    Frequency 7X/week   Barriers to discharge        Co-evaluation               AM-PAC PT "6 Clicks" Mobility  Outcome Measure Help needed turning from your back to your side while in a flat bed without using bedrails?: A Little Help needed moving from lying on your back to sitting on the side of a flat bed without using bedrails?: A Little Help needed moving to and from a bed to a chair (including a wheelchair)?: A Little Help needed standing up from a chair using your arms (e.g., wheelchair or bedside chair)?: A Little Help needed to walk in hospital room?: A Little Help needed climbing 3-5 steps with a railing? : A Lot 6 Click Score: 17    End of Session   Activity Tolerance: Patient limited by pain Patient left: in bed;with call bell/phone within reach;with family/visitor present Nurse Communication: Mobility status PT Visit Diagnosis: Difficulty in walking, not elsewhere classified (R26.2);Pain Pain - Right/Left: Right Pain - part of body: Hip     Time: 3664-4034 PT Time Calculation (min) (ACUTE ONLY): 35 min   Charges:   PT Evaluation $PT Eval Low Complexity: 1 Low PT Treatments $Therapeutic Activity: 8-22 mins        Wyona Almas, PT, DPT Acute Rehabilitation Services Pager [phone] Office [phone]   Deno Etienne 08/06/2021, 5:31 PM

## 2021-08-06 NOTE — Anesthesia Procedure Notes (Signed)
Spinal  Patient location during procedure: OR Start time: 08/06/2021 12:33 PM End time: 08/06/2021 12:37 PM Reason for block: surgical anesthesia Staffing Performed: anesthesiologist  Anesthesiologist: Brennan Bailey, MD Preanesthetic Checklist Completed: patient identified, IV checked, risks and benefits discussed, surgical consent, monitors and equipment checked, pre-op evaluation and timeout performed Spinal Block Patient position: sitting Prep: DuraPrep and site prepped and draped Patient monitoring: continuous pulse ox, blood pressure and heart rate Approach: midline Location: L3-4 Injection technique: single-shot Needle Needle type: Pencan  Needle gauge: 24 G Needle length: 9 cm Assessment Events: CSF return Additional Notes Risks, benefits, and alternative discussed. Patient gave consent to procedure. Prepped and draped in sitting position. Patient sedated but responsive to voice. Clear CSF obtained after 2 attempts. Positive terminal aspiration. No pain or paraesthesias with injection. Patient tolerated procedure well. Vital signs stable. Tawny Asal, MD

## 2021-08-06 NOTE — Anesthesia Procedure Notes (Signed)
Procedure Name: MAC Date/Time: 08/06/2021 12:30 PM Performed by: Erick Colace, CRNA Pre-anesthesia Checklist: Patient identified, Emergency Drugs available, Suction available, Patient being monitored and Timeout performed Patient Re-evaluated:Patient Re-evaluated prior to induction Oxygen Delivery Method: Simple face mask Preoxygenation: Pre-oxygenation with 100% oxygen Induction Type: IV induction Ventilation: Oral airway inserted - appropriate to patient size Dental Injury: Teeth and Oropharynx as per pre-operative assessment

## 2021-08-06 NOTE — Discharge Instructions (Signed)

## 2021-08-06 NOTE — Op Note (Signed)
RIGHT TOTAL HIP ARTHROPLASTY ANTERIOR APPROACH  Procedure Note Catherine Mcconnell   [id number]  Pre-op Diagnosis: right hip avascular necrosis, degenerative joint disease     Post-op Diagnosis: same   Operative Procedures  1. Total hip replacement; Right hip; uncemented [id number]   Surgeon: Frankey Shown, M.D.  Assist: Madalyn Rob, PA-C   Anesthesia: spinal, local  Prosthesis: Depuy Acetabulum: Pinnacle 52 mm Femur: Actis 5 STD Head: 36 mm size: +1.5 Liner: +4 Bearing Type: metal/poly  Total Hip Arthroplasty (Anterior Approach) Op Note:  After informed consent was obtained and the operative extremity marked in the holding area, the patient was brought back to the operating room and placed supine on the HANA table. Next, the operative extremity was prepped and draped in normal sterile fashion. Surgical timeout occurred verifying patient identification, surgical site, surgical procedure and administration of antibiotics.  A modified anterior Smith-Peterson approach to the hip was performed, using the interval between tensor fascia lata and sartorius.  Dissection was carried bluntly down onto the anterior hip capsule. The lateral femoral circumflex vessels were identified and coagulated. A capsulotomy was performed and the capsular flaps tagged for later repair.  The neck osteotomy was performed. The femoral head was removed which showed severe wear, the acetabular rim was cleared of soft tissue and attention was turned to reaming the acetabulum.  Sequential reaming was performed under fluoroscopic guidance. We reamed to a size 51 mm, and then impacted the acetabular shell which had excellent fixation.  The liner was then placed after irrigation and attention turned to the femur.  After placing the femoral hook, the leg was taken to externally rotated, extended and adducted position taking care to perform soft tissue releases to allow for adequate mobilization of the femur. Soft  tissue was cleared from the shoulder of the greater trochanter and the hook elevator used to improve exposure of the proximal femur. Sequential broaching performed up to a size 5. Trial neck and head were placed. The leg was brought back up to neutral and the construct reduced.  Antibiotic irrigation was placed in the surgical wound and kept for at least 1 minute.  The position and sizing of components, offset and leg lengths were checked using fluoroscopy. Stability of the construct was checked in extension and external rotation without any subluxation or impingement of prosthesis. We dislocated the prosthesis, dropped the leg back into position, removed trial components, and irrigated copiously. The final stem and head was then placed, the leg brought back up, the system reduced and fluoroscopy used to verify positioning.  We irrigated, obtained hemostasis and closed the capsule using #2 ethibond suture.  One gram of vancomycin powder was placed in the surgical bed.   One gram of topical tranexamic acid was injected into the joint.  The fascia was closed with #1 vicryl plus, the deep fat layer was closed with 0 vicryl, the subcutaneous layers closed with 2.0 Vicryl Plus and the skin closed with 2.0 nylon and dermabond. A sterile dressing was applied. The patient was awakened in the operating room and taken to recovery in stable condition.  All sponge, needle, and instrument counts were correct at the end of the case.   Tawanna Cooler, my PA, was a medical necessity for opening, closing, limb positioning, retracting, exposing, and overall facilitation and timely completion of the surgery.  Position: supine  Complications: see description of procedure.  Time Out: performed   Drains/Packing: none  Estimated blood loss: see anesthesia record  Returned  to Recovery Room: in good condition.   Antibiotics: yes   Mechanical VTE (DVT) Prophylaxis: sequential compression devices, TED thigh-high  Chemical  VTE (DVT) Prophylaxis: aspirin   Fluid Replacement: see anesthesia record  Specimens Removed: 1 to pathology   Sponge and Instrument Count Correct? yes   PACU: portable radiograph - low AP   Plan/RTC: Return in 2 weeks for staple removal. Weight Bearing/Load Lower Extremity: full  Hip precautions: none Suture Removal: 2 weeks   N. Eduard Roux, MD [facility] 1:46 PM   Implant Name Type Inv. Item Serial No. Manufacturer Lot No. LRB No. Used Action  PIN SECTOR W/GRIP ACE CUP 52MM - [id number] Hips PIN SECTOR W/GRIP ACE CUP 52MM  [employer] [id number] Right 1 Implanted  LINER NEUTRAL 52X36MM PLUS 4 - [id number] Liner LINER NEUTRAL 52X36MM PLUS 4  [employer] [id number] Right 1 Implanted  SROM M HEAD 36MM PLUS 1.5 - [id number] Hips SROM M HEAD 36MM PLUS 1.5  [employer] [id number] Right 1 Implanted  STEM FEMORAL SZ 5MM STD ACTIS - [id number] Stem STEM FEMORAL SZ 5MM STD ACTIS  [employer] [id number] Right 1 Implanted

## 2021-08-06 NOTE — Transfer of Care (Signed)
Immediate Anesthesia Transfer of Care Note  Patient: Catherine Mcconnell  Procedure(s) Performed: RIGHT TOTAL HIP ARTHROPLASTY ANTERIOR APPROACH (Right: Hip)  Patient Location: PACU  Anesthesia Type:MAC combined with regional for post-op pain  Level of Consciousness: awake  Airway & Oxygen Therapy: Patient Spontanous Breathing and Patient connected to face mask oxygen  Post-op Assessment: Report given to RN and Post -op Vital signs reviewed and stable  Post vital signs: Reviewed and stable  Last Vitals:  Vitals Value Taken Time  BP 125/95 08/06/21 1416  Temp 36.7 C 08/06/21 1415  Pulse 63 08/06/21 1418  Resp 15 08/06/21 1418  SpO2 100 % 08/06/21 1418  Vitals shown include unvalidated device data.  Last Pain:  Vitals:   08/06/21 1106  TempSrc:   PainSc: 0-No pain         Complications: No notable events documented.

## 2021-08-06 NOTE — Anesthesia Postprocedure Evaluation (Signed)
Anesthesia Post Note  Patient: Catherine Mcconnell  Procedure(s) Performed: RIGHT TOTAL HIP ARTHROPLASTY ANTERIOR APPROACH (Right: Hip)     Patient location during evaluation: PACU Anesthesia Type: Spinal Level of consciousness: awake and alert and oriented Pain management: pain level controlled Vital Signs Assessment: post-procedure vital signs reviewed and stable Respiratory status: spontaneous breathing, nonlabored ventilation and respiratory function stable Cardiovascular status: blood pressure returned to baseline Postop Assessment: no apparent nausea or vomiting, spinal receding, no headache and no backache Anesthetic complications: no   No notable events documented.  Last Vitals:  Vitals:   08/06/21 1515 08/06/21 1534  BP: (!) 147/70 (!) 151/67  Pulse: (!) 44 (!) 50  Resp: 14 18  Temp: 36.7 C 36.5 C  SpO2: 100% 100%    Last Pain:  Vitals:   08/06/21 1515  TempSrc:   PainSc: 0-No pain                 Marthenia Rolling

## 2021-08-06 NOTE — Anesthesia Preprocedure Evaluation (Addendum)
Anesthesia Evaluation  Patient identified by MRN, date of birth, ID band Patient awake    Reviewed: Allergy & Precautions, NPO status , Patient's Chart, lab work & pertinent test results, reviewed documented beta blocker date and time   History of Anesthesia Complications Negative for: history of anesthetic complications  Airway Mallampati: II  TM Distance: >3 FB Neck ROM: Full    Dental  (+) Edentulous Lower, Edentulous Upper   Pulmonary sleep apnea and Continuous Positive Airway Pressure Ventilation , former smoker,    Pulmonary exam normal        Cardiovascular hypertension, Pt. on medications and Pt. on home beta blockers Normal cardiovascular exam     Neuro/Psych negative neurological ROS  negative psych ROS   GI/Hepatic negative GI ROS, Neg liver ROS,   Endo/Other  diabetes, Type 2, Insulin Dependent  Renal/GU Renal InsufficiencyRenal disease  negative genitourinary   Musculoskeletal  (+) Arthritis , right hip avascular necrosis   Abdominal   Peds  Hematology negative hematology ROS (+)   Anesthesia Other Findings Day of surgery medications reviewed with patient.  Reproductive/Obstetrics negative OB ROS                            Anesthesia Physical Anesthesia Plan  ASA: 2  Anesthesia Plan: Spinal   Post-op Pain Management:    Induction:   PONV Risk Score and Plan: Treatment may vary due to age or medical condition, Ondansetron, Propofol infusion and Dexamethasone  Airway Management Planned: Natural Airway and Simple Face Mask  Additional Equipment: None  Intra-op Plan:   Post-operative Plan:   Informed Consent: I have reviewed the patients History and Physical, chart, labs and discussed the procedure including the risks, benefits and alternatives for the proposed anesthesia with the patient or authorized representative who has indicated his/her understanding and  acceptance.       Plan Discussed with: CRNA  Anesthesia Plan Comments:        Anesthesia Quick Evaluation

## 2021-08-07 ENCOUNTER — Encounter (HOSPITAL_COMMUNITY): Payer: Self-pay | Admitting: Orthopaedic Surgery

## 2021-08-07 DIAGNOSIS — M87051 Idiopathic aseptic necrosis of right femur: Secondary | ICD-10-CM | POA: Diagnosis not present

## 2021-08-07 LAB — CBC
HCT: 31.7 % — ABNORMAL LOW (ref 36.0–46.0)
Hemoglobin: 11 g/dL — ABNORMAL LOW (ref 12.0–15.0)
MCH: 30.7 pg (ref 26.0–34.0)
MCHC: 34.7 g/dL (ref 30.0–36.0)
MCV: 88.5 fL (ref 80.0–100.0)
Platelets: 160 10*3/uL (ref 150–400)
RBC: 3.58 MIL/uL — ABNORMAL LOW (ref 3.87–5.11)
RDW: 11.7 % (ref 11.5–15.5)
WBC: 4.7 10*3/uL (ref 4.0–10.5)
nRBC: 0 % (ref 0.0–0.2)

## 2021-08-07 LAB — BASIC METABOLIC PANEL
Anion gap: 8 (ref 5–15)
BUN: 12 mg/dL (ref 8–23)
CO2: 29 mmol/L (ref 22–32)
Calcium: 8.7 mg/dL — ABNORMAL LOW (ref 8.9–10.3)
Chloride: 98 mmol/L (ref 98–111)
Creatinine, Ser: 1.33 mg/dL — ABNORMAL HIGH (ref 0.44–1.00)
GFR, Estimated: 42 mL/min — ABNORMAL LOW (ref >60)
Glucose, Bld: 88 mg/dL (ref 70–99)
Potassium: 3.5 mmol/L (ref 3.5–5.1)
Sodium: 135 mmol/L (ref 135–145)

## 2021-08-07 LAB — GLUCOSE, CAPILLARY: Glucose-Capillary: 94 mg/dL (ref 70–99)

## 2021-08-07 NOTE — TOC Progression Note (Signed)
Transition of Care [facility]) - Progression Note    Patient Details  Name: Catherine Mcconnell MRN: [id number] Date of Birth: Jan 05, 1946  Transition of Care [facility]) CM/SW [place], RN Phone Number:[phone]  08/07/2021, 8:31 AM  Clinical Narrative:    Home health was set up pre op with [facility]. Info has been added to avs. No other needs noted at this time.         Expected Discharge Plan and Services           Expected Discharge Date: 08/07/21                                     Social Determinants of Health (SDOH) Interventions    Readmission Risk Interventions No flowsheet data found.

## 2021-08-07 NOTE — Plan of Care (Signed)
Pt given D/C instructions with verbal understanding. Rx's were sent to the pharmacy by MD. Pt's incision is clean and dry with no sign of infection. Pt's IV was removed prior to D/C. Home Health was arranged per MD order. Pt received RW from [employer] per MD order. Pt D/C'd home via wheelchair per MD order. Pt is stable @ D/C and has no other needs at this time. Holli Humbles, RN

## 2021-08-07 NOTE — Discharge Summary (Signed)
Patient ID: Catherine Mcconnell MRN: [id number] DOB/AGE: 75-May-1947 75 y.o.  Admit date: 08/06/2021 Discharge date: 08/07/2021  Admission Diagnoses:  Principal Problem:   Avascular necrosis of bone of right hip ([place]) Active Problems:   Status post total replacement of right hip   Discharge Diagnoses:  Same  Past Medical History:  Diagnosis Date   Anemia    on meds   Arthritis    hips/wrists/back   Cataract    sx-bilateral   Chronic kidney disease    stage 3   Diabetes mellitus without complication ([place])    on meds   Hypertension    on meds   Iron deficiency    on oral iron supplements, has not required transfusion   Mixed hyperlipidemia    on meds   OSA on CPAP    compliant   Sleep apnea    uses CPAP   Thyroid nodule    removed-not on meds at this time (12/01/2020)    Surgeries: Procedure(s): RIGHT TOTAL HIP ARTHROPLASTY ANTERIOR APPROACH on 08/06/2021   Consultants:   Discharged Condition: Improved  Hospital Course: Catherine Mcconnell is an 75 y.o. female who was admitted 08/06/2021 for operative treatment ofAvascular necrosis of bone of right hip ([place]). Patient has severe unremitting pain that affects sleep, daily activities, and work/hobbies. After pre-op clearance the patient was taken to the operating room on 08/06/2021 and underwent  Procedure(s): RIGHT TOTAL HIP ARTHROPLASTY ANTERIOR APPROACH.    Patient was given perioperative antibiotics:  Anti-infectives (From admission, onward)    Start     Dose/Rate Route Frequency Ordered Stop   08/06/21 1800  ceFAZolin (ANCEF) IVPB 2g/100 mL premix        2 g 200 mL/hr over 30 Minutes Intravenous Every 6 hours 08/06/21 1539 08/06/21 2342   08/06/21 1309  vancomycin (VANCOCIN) powder  Status:  Discontinued          As needed 08/06/21 1309 08/06/21 1410   08/06/21 1100  ceFAZolin (ANCEF) IVPB 2g/100 mL premix        2 g 200 mL/hr over 30 Minutes Intravenous On call to O.R. 08/06/21 1046 08/06/21 1303         Patient was given sequential compression devices, early ambulation, and chemoprophylaxis to prevent DVT.  Patient benefited maximally from hospital stay and there were no complications.    Recent vital signs: Patient Vitals for the past 24 hrs:  BP Temp Temp src Pulse Resp SpO2 Height Weight  08/07/21 0447 (!) 168/71 98.1 F (36.7 C) Oral (!) 55 18 100 % -- --  08/06/21 2335 (!) 164/69 98 F (36.7 C) Oral (!) 56 18 100 % -- --  08/06/21 1937 (!) 154/81 98.1 F (36.7 C) Oral 63 18 100 % -- --  08/06/21 1534 (!) 151/67 97.7 F (36.5 C) -- (!) 50 18 100 % -- --  08/06/21 1515 (!) 147/70 98 F (36.7 C) -- (!) 44 14 100 % -- --  08/06/21 1500 (!) 141/70 -- -- (!) 44 13 100 % -- --  08/06/21 1445 138/67 -- -- (!) 48 15 98 % -- --  08/06/21 1430 140/74 -- -- (!) 55 14 99 % -- --  08/06/21 1415 (!) 125/95 98 F (36.7 C) -- 60 16 100 % -- --  08/06/21 1132 (!) 168/66 -- -- 61 17 99 % -- --  08/06/21 1101 (!) 170/62 98.3 F (36.8 C) Oral (!) 57 18 99 % 5\' 6"  (1.676 m) 71.7 kg  Recent laboratory studies:  Recent Labs    08/07/21 0504  WBC 4.7  HGB 11.0*  HCT 31.7*  PLT 160  NA 135  K 3.5  CL 98  CO2 29  BUN 12  CREATININE 1.33*  GLUCOSE 88  CALCIUM 8.7*     Discharge Medications:   Allergies as of 08/07/2021       Reactions   Lisinopril Rash   Metformin And Related Swelling        Medication List     STOP taking these medications    furosemide 20 MG tablet Commonly known as: LASIX   HYDROcodone-acetaminophen 5-325 MG tablet Commonly known as: Norco   oxycodone 5 MG capsule Commonly known as: OXY-IR       TAKE these medications    amLODipine 10 MG tablet Commonly known as: NORVASC Take 1 tablet (10 mg total) by mouth daily.   aspirin EC 81 MG tablet Take 1 tablet (81 mg total) by mouth 2 (two) times daily. To be taken after surgery to prevent blood clots   atorvastatin 80 MG tablet Commonly known as: LIPITOR Take 1 tablet (80 mg total) by  mouth daily.   carvedilol 6.25 MG tablet Commonly known as: COREG TAKE 1 TABLET(6.25 MG) BY MOUTH TWICE DAILY WITH A MEAL   cephALEXin 500 MG capsule Commonly known as: Keflex Take 1 capsule (500 mg total) by mouth 4 (four) times daily. To be taken after surgery   docusate sodium 100 MG capsule Commonly known as: Colace Take 1 capsule (100 mg total) by mouth daily as needed.   donepezil 10 MG disintegrating tablet Commonly known as: ARICEPT ODT Take 1 tablet (10 mg total) by mouth at bedtime.   ferrous sulfate 325 (65 FE) MG tablet Commonly known as: Iron Supplement Take 1 tablet (325 mg total) by mouth 2 (two) times daily with a meal.   gabapentin 100 MG capsule Commonly known as: NEURONTIN Take 3 capsules by mouth every day What changed: when to take this   losartan 50 MG tablet Commonly known as: COZAAR Take 1 tablet (50 mg total) by mouth daily.   Lumigan 0.01 % Soln Generic drug: bimatoprost Place 1 drop into both eyes every evening.   methocarbamol 500 MG tablet Commonly known as: Robaxin Take 1 tablet (500 mg total) by mouth 2 (two) times daily as needed. To be taken after surgery   ondansetron 4 MG tablet Commonly known as: Zofran Take 1 tablet (4 mg total) by mouth every 8 (eight) hours as needed for nausea or vomiting.   OneTouch Ultra test strip Generic drug: glucose blood USE TO TEST BLOOD SUGARS  TWICE DAILY   onetouch ultrasoft lancets CHECK TWICE PER DAY BEFORE  BREAKFAST AND DINNER   oxyCODONE-acetaminophen 5-325 MG tablet Commonly known as: Percocet Take 1-2 tablets by mouth every 6 (six) hours as needed. To be taken after surgery   Rybelsus 14 MG Tabs Generic drug: Semaglutide Take 1 tablet by mouth daily.   Tyler Aas FlexTouch 100 UNIT/ML FlexTouch Pen Generic drug: insulin degludec Inject 10 Units into the skin daily.   TRUEplus Pen Needles 32G X 4 MM Misc Generic drug: Insulin Pen Needle 1 Device by Other route daily.                Durable Medical Equipment  (From admission, onward)           Start     Ordered   08/06/21 1540  DME Walker rolling  Once  Question:  Patient needs a walker to treat with the following condition  Answer:  History of hip replacement   08/06/21 1539   08/06/21 1540  DME 3 n 1  Once        08/06/21 1539   08/06/21 1540  DME Bedside commode  Once       Question:  Patient needs a bedside commode to treat with the following condition  Answer:  History of hip replacement   08/06/21 1539            Diagnostic Studies: DG Pelvis Portable  Result Date: 08/06/2021 CLINICAL DATA:  Status post right total hip arthroplasty. EXAM: PORTABLE PELVIS 1-2 VIEWS COMPARISON:  None. FINDINGS: The right femoral and acetabular components are well situated. Expected postoperative changes are noted in the surrounding soft tissues. IMPRESSION: Status post right total hip arthroplasty. Electronically Signed   By: Marijo Conception M.D.   On: 08/06/2021 15:06   DG C-Arm 1-60 Min-No Report  Result Date: 08/06/2021 Fluoroscopy was utilized by the requesting physician.  No radiographic interpretation.   DG HIP OPERATIVE UNILAT WITH PELVIS RIGHT  Result Date: 08/06/2021 CLINICAL DATA:  Surgery, elective Z41.9 (ICD-10-CM) EXAM: OPERATIVE RIGHT HIP (WITH PELVIS IF PERFORMED) 2 VIEWS TECHNIQUE: Fluoroscopic spot image(s) were submitted for interpretation post-operatively. COMPARISON:  05/02/2021 FINDINGS: Fluoro time: 19 seconds. Reported radiation: 2.417 mGy. Two C-arm fluoroscopic images were obtained intraoperatively and submitted for post operative interpretation. These images demonstrate postsurgical changes of right total hip arthroplasty. No unexpected findings. Please see the performing provider's procedural report for further detail. IMPRESSION: Right total hip arthroplasty. Electronically Signed   By: Margaretha Sheffield M.D.   On: 08/06/2021 15:44    Disposition: Discharge disposition: 01-Home  or Self Care          Follow-up Information     Leandrew Koyanagi, MD. Schedule an appointment as soon as possible for a visit in 2 week(s).   Specialty: Orthopedic Surgery Contact information: [address] [place] [address] [phone]                  Signed: Aundra Dubin 08/07/2021, 8:06 AM

## 2021-08-07 NOTE — Progress Notes (Addendum)
Subjective: 1 Day Post-Op Procedure(s) (LRB): RIGHT TOTAL HIP ARTHROPLASTY ANTERIOR APPROACH (Right) Patient reports pain as mild.    Objective: Vital signs in last 24 hours: Temp:  [97.7 F (36.5 C)-98.3 F (36.8 C)] 98.1 F (36.7 C) (10/25 0447) Pulse Rate:  [44-63] 55 (10/25 0447) Resp:  [13-18] 18 (10/25 0447) BP: (125-170)/(62-95) 168/71 (10/25 0447) SpO2:  [98 %-100 %] 100 % (10/25 0447) Weight:  [71.7 kg] 71.7 kg (10/24 1101)  Intake/Output from previous day: 10/24 0701 - 10/25 0700 In: 1240 [P.O.:240; I.V.:1000] Out: 2250 [Urine:2050; Blood:200] Intake/Output this shift: No intake/output data recorded.  Recent Labs    08/07/21 0504  HGB 11.0*   Recent Labs    08/07/21 0504  WBC 4.7  RBC 3.58*  HCT 31.7*  PLT 160   Recent Labs    08/07/21 0504  NA 135  K 3.5  CL 98  CO2 29  BUN 12  CREATININE 1.33*  GLUCOSE 88  CALCIUM 8.7*   No results for input(s): LABPT, INR in the last 72 hours.  Neurologically intact Neurovascular intact Sensation intact distally Intact pulses distally Dorsiflexion/Plantar flexion intact Incision: dressing C/D/I No cellulitis present Compartment soft   Assessment/Plan: 1 Day Post-Op Procedure(s) (LRB): RIGHT TOTAL HIP ARTHROPLASTY ANTERIOR APPROACH (Right) Advance diet Up with therapy WBAT RLE ABLA- mild and stable D/c after second pt session       Aundra Dubin 08/07/2021, 8:05 AM

## 2021-08-07 NOTE — Evaluation (Signed)
Occupational Therapy Evaluation Patient Details Name: Catherine Mcconnell MRN: [id number] DOB: November 12, 1945 Today's Date: 08/07/2021   History of Present Illness Pt is a 75 y.o. F who presents for surgical treatment of right hip avascular necrosis. Now s/p right total hip arthroplasty anterior approach 08/06/2021. Significant PMH: CKD stage 3, DM, HTN.   Clinical Impression   PTA patient was living with family in a private residence and was independent with ADLs/IADLs without AD. Patient currently presents slightly below baseline level of function demonstrating observed ADLs, functional transfers and mobility with supervision to Mod I with use of RW. OT provided education on  safe use of DME/AE maximize safety and independence with self-care tasks and ADL transfers and acquisition/use of AE. Patient expressed verbal understanding. Patient does not require continued acute occupational therapy services with OT to sign off at this time.       Recommendations for follow up therapy are one component of a multi-disciplinary discharge planning process, led by the attending physician.  Recommendations may be updated based on patient status, additional functional criteria and insurance authorization.   Follow Up Recommendations  No OT follow up    Assistance Recommended at Discharge Intermittent Supervision/Assistance  Functional Status Assessment  Patient has had a recent decline in their functional status and demonstrates the ability to make significant improvements in function in a reasonable and predictable amount of time.  Equipment Recommendations  None recommended by OT    Recommendations for Other Services       Precautions / Restrictions Precautions Precautions: Fall Restrictions Weight Bearing Restrictions: No      Mobility Bed Mobility Overal bed mobility: Needs Assistance Bed Mobility: Supine to Sit     Supine to sit: Supervision     General bed mobility comments:  Supervision A for safety. Patient with good return demo of technique. Able to advance RLE from bed surface to EOB without external assist.    Transfers Overall transfer level: Needs assistance Equipment used: Rolling walker (2 wheels) Transfers: Sit to/from Stand Sit to Stand: Supervision           General transfer comment: Supervision A for safety. Good return demo for hand placement and technique.      Balance Overall balance assessment: Needs assistance Sitting-balance support: Feet supported Sitting balance-Leahy Scale: Good     Standing balance support: Bilateral upper extremity supported Standing balance-Leahy Scale: Fair Standing balance comment: Able to maintain static standing balance without UE support during grooming/bathing tasks standing at sink level. Reliant on BUE support on RW with mobility.                           ADL either performed or assessed with clinical judgement   ADL Overall ADL's : Needs assistance/impaired Eating/Feeding: Independent   Grooming: Supervision/safety Grooming Details (indicate cue type and reason): 2/3 grooming tasks standing at sink level with supervision A. Cues for safety with RW. Upper Body Bathing: Supervision/ safety;Standing   Lower Body Bathing: Supervison/ safety;Sit to/from stand   Upper Body Dressing : Set up;Sitting   Lower Body Dressing: Supervision/safety;Sit to/from stand   Toilet Transfer: Supervision/safety;Rolling walker (2 wheels)   Toileting- Clothing Manipulation and Hygiene: Supervision/safety;Sit to/from stand               Vision   Vision Assessment?: No apparent visual deficits     Perception     Praxis      Pertinent Vitals/Pain Pain Assessment: 0-10 Pain Score:  5  Pain Location: R hip Pain Descriptors / Indicators: Discomfort;Guarding;Grimacing Pain Intervention(s): Monitored during session;Limited activity within patient's tolerance;Premedicated before  session;Repositioned;Ice applied     Hand Dominance     Extremity/Trunk Assessment Upper Extremity Assessment Upper Extremity Assessment: Overall WFL for tasks assessed   Lower Extremity Assessment Lower Extremity Assessment: Defer to PT evaluation   Cervical / Trunk Assessment Cervical / Trunk Assessment: Normal   Communication Communication Communication: No difficulties   Cognition Arousal/Alertness: Awake/alert Behavior During Therapy: WFL for tasks assessed/performed Overall Cognitive Status: Within Functional Limits for tasks assessed                                       General Comments  Clean, dry dressing at incision.    Exercises     Shoulder Instructions      Home Living Family/patient expects to be discharged to:: Private residence Living Arrangements: Children Available Help at Discharge: Family Type of Home: House Home Access: Stairs to enter Technical brewer of Steps: 4 Entrance Stairs-Rails: Left Home Layout: Able to live on main level with bedroom/bathroom     Bathroom Shower/Tub: Tub/shower unit         Home Equipment: Rollator (4 wheels);Tub bench;BSC;Shower seat;Adaptive equipment Adaptive Equipment: Reacher        Prior Functioning/Environment Prior Level of Function : Independent/Modified Independent                        OT Problem List: Decreased strength;Impaired balance (sitting and/or standing)      OT Treatment/Interventions:      OT Goals(Current goals can be found in the care plan section) Acute Rehab OT Goals Patient Stated Goal: To return home. OT Goal Formulation: With patient Time For Goal Achievement: 08/21/21 Potential to Achieve Goals: Good  OT Frequency:     Barriers to D/C:            Co-evaluation              AM-PAC OT "6 Clicks" Daily Activity     Outcome Measure Help from another person eating meals?: None Help from another person taking care of personal  grooming?: A Little Help from another person toileting, which includes using toliet, bedpan, or urinal?: A Little Help from another person bathing (including washing, rinsing, drying)?: A Little Help from another person to put on and taking off regular upper body clothing?: A Little Help from another person to put on and taking off regular lower body clothing?: A Little 6 Click Score: 19   End of Session Equipment Utilized During Treatment: Rolling walker (2 wheels) Nurse Communication: Mobility status  Activity Tolerance: Patient tolerated treatment well Patient left: in chair;with call bell/phone within reach  OT Visit Diagnosis: Unsteadiness on feet (R26.81);Muscle weakness (generalized) (M62.81);Pain Pain - Right/Left: Right Pain - part of body: Hip                Time: 0701-0730 OT Time Calculation (min): 29 min Charges:  OT General Charges $OT Visit: 1 Visit OT Evaluation $OT Eval Low Complexity: 1 Low OT Treatments $Self Care/Home Management : 8-22 mins  Carrick Rijos H. OTR/L Supplemental OT, Department of rehab services [phone]  Janavia Rottman R H. 08/07/2021, 7:48 AM

## 2021-08-07 NOTE — Progress Notes (Signed)
Physical Therapy Treatment Patient Details Name: Catherine Mcconnell MRN: [id number] DOB: [birth year] Today's Date: 08/07/2021   History of Present Illness Pt is a 75 y.o. F who presents for surgical treatment of right hip avascular necrosis. Now s/p right total hip arthroplasty anterior approach 08/06/2021. Significant PMH: CKD stage 3, DM, HTN.    PT Comments    Pt made excellent progress towards her physical therapy goals during her inpatient stay. Ambulating 400 feet with a walker modI, utilizing a step through pattern. Demonstrates mild RLE circumduction. Able to negotiate 4 steps with a left railing to simulate home set up and then perform standing exercises for strengthening. No further acute PT needs. Thank you for this consult.     Recommendations for follow up therapy are one component of a multi-disciplinary discharge planning process, led by the attending physician.  Recommendations may be updated based on patient status, additional functional criteria and insurance authorization.  Follow Up Recommendations  Follow physician's recommendations for discharge plan and follow up therapies     Assistance Recommended at Discharge PRN  Equipment Recommendations  Rolling walker (2 wheels)    Recommendations for Other Services       Precautions / Restrictions Precautions Precautions: Fall Restrictions Weight Bearing Restrictions: No     Mobility  Bed Mobility               General bed mobility comments: Standing in room upon arrial    Transfers Overall transfer level: Modified independent Equipment used: Rolling walker (2 wheels)                    Ambulation/Gait Ambulation/Gait assistance: Modified independent (Device/Increase time) Gait Distance (Feet): 400 Feet Assistive device: Rolling walker (2 wheels) Gait Pattern/deviations: Step-through pattern Gait velocity: decreased   General Gait Details: Pt demonstrates step through pattern, good heel  strike, min cues for walker proximity and decreased RLE circumduction   Stairs Stairs: Yes Stairs assistance: Modified independent (Device/Increase time) Stair Management: One rail Left Number of Stairs: 4 General stair comments: cues for sequencing/technique   Wheelchair Mobility    Modified Rankin (Stroke Patients Only)       Balance Overall balance assessment: Needs assistance Sitting-balance support: Feet supported Sitting balance-Leahy Scale: Good     Standing balance support: No upper extremity supported;During functional activity Standing balance-Leahy Scale: Fair                              Cognition Arousal/Alertness: Awake/alert Behavior During Therapy: WFL for tasks assessed/performed Overall Cognitive Status: Within Functional Limits for tasks assessed                                          Exercises Total Joint Exercises Long Arc Quad: Right;Seated;Other (comment) (8 reps) Other Exercises Other Exercises: Standing with BUE support: right hip abduction, hamstring curls, bilateral hip marching x 10 each    General Comments        Pertinent Vitals/Pain Pain Assessment: Faces Faces Pain Scale: Hurts little more Pain Location: R hip Pain Descriptors / Indicators: Discomfort;Guarding;Grimacing Pain Intervention(s): Monitored during session    Home Living                          Prior Function  PT Goals (current goals can now be found in the care plan section) Acute Rehab PT Goals Patient Stated Goal: less pain PT Goal Formulation: With patient Time For Goal Achievement: 08/20/21 Potential to Achieve Goals: Good Progress towards PT goals: Goals met/education completed, patient discharged from PT    Frequency    7X/week      PT Plan Current plan remains appropriate    Co-evaluation              AM-PAC PT "6 Clicks" Mobility   Outcome Measure  Help needed turning from your  back to your side while in a flat bed without using bedrails?: None Help needed moving from lying on your back to sitting on the side of a flat bed without using bedrails?: None Help needed moving to and from a bed to a chair (including a wheelchair)?: None Help needed standing up from a chair using your arms (e.g., wheelchair or bedside chair)?: None Help needed to walk in hospital room?: None Help needed climbing 3-5 steps with a railing? : None 6 Click Score: 24    End of Session   Activity Tolerance: Patient tolerated treatment well Patient left: in bed;with call bell/phone within reach;with family/visitor present Nurse Communication: Mobility status PT Visit Diagnosis: Difficulty in walking, not elsewhere classified (R26.2);Pain Pain - Right/Left: Right Pain - part of body: Hip     Time: 0123-7990 PT Time Calculation (min) (ACUTE ONLY): 19 min  Charges:  $Gait Training: 8-22 mins                     Wyona Almas, PT, DPT Acute Rehabilitation Services Pager [phone] Office [phone]    Deno Etienne 08/07/2021, 11:12 AM

## 2021-08-21 ENCOUNTER — Encounter: Payer: Medicare Other | Admitting: Orthopaedic Surgery

## 2021-08-21 ENCOUNTER — Encounter: Payer: Self-pay | Admitting: Orthopaedic Surgery

## 2021-08-21 ENCOUNTER — Other Ambulatory Visit: Payer: Self-pay

## 2021-08-21 ENCOUNTER — Ambulatory Visit (INDEPENDENT_AMBULATORY_CARE_PROVIDER_SITE_OTHER): Payer: Medicare Other | Admitting: Physician Assistant

## 2021-08-21 DIAGNOSIS — Z96641 Presence of right artificial hip joint: Secondary | ICD-10-CM

## 2021-08-21 MED ORDER — OXYCODONE-ACETAMINOPHEN 5-325 MG PO TABS: 1.0000 | ORAL_TABLET | Freq: Four times a day (QID) | ORAL | 0 refills | Status: DC | PRN

## 2021-08-21 NOTE — Progress Notes (Signed)
Post-Op Visit Note   Patient: Catherine Mcconnell           Date of Birth: 1946/09/29           MRN: [id number] Visit Date: 08/21/2021 PCP: Inda Coke, PA   Assessment & Plan:  Chief Complaint:  Chief Complaint  Patient presents with   Right Hip - Follow-up    Right total hip arthroplasty 08/06/2021   Visit Diagnoses:  1. Status post total replacement of right hip     Plan: Patient is a pleasant 75 year old female who comes in today 2 weeks out right total knee replacement 08/06/2021.  She has been doing well.  She has been getting home health physical therapy and is ambulating with a walker.  She denies any calf pain, chest pain or shortness of breath.  Examination of her right hip reveals a well-healed surgical wound incision with nylon sutures in place.  No evidence of infection or cellulitis.  Calf soft nontender.  She is neurovascular intact distally.  At this point, she will continue with her home health physical therapy.  Her daughter notes that she has 3-4 visits left.  If they think she needs outpatient physical therapy at that point they will let us know.  I have refilled her pain medication.  She will follow-up with [place] in 4 weeks time for repeat evaluation and AP pelvis x-rays.  Call with concerns or questions.  Follow-Up Instructions: Return in about 4 weeks (around 09/18/2021).   Orders:  No orders of the defined types were placed in this encounter.  No orders of the defined types were placed in this encounter.   Imaging: No new imaging  PMFS History: Patient Active Problem List   Diagnosis Date Noted   Status post total replacement of right hip 08/06/2021   Preop cardiovascular exam 06/04/2021   Avascular necrosis of bone of right hip ([place]) 05/02/2021   Type 2 diabetes mellitus with hyperglycemia, without long-term current use of insulin ([place]) 09/11/2020   Type 2 diabetes mellitus with diabetic polyneuropathy, without long-term current use of insulin ([place])  09/11/2020   Type 2 diabetes mellitus with stage 3a chronic kidney disease, without long-term current use of insulin ([place]) 09/11/2020   Type 2 diabetes mellitus with hyperglycemia, with long-term current use of insulin ([place]) 06/06/2020   Memory deficits 03/16/2020   Diabetes mellitus without complication ([place]) 62/83/1517   OSA on CPAP    Chronic kidney disease    Essential hypertension    Mixed hyperlipidemia    Past Medical History:  Diagnosis Date   Anemia    on meds   Arthritis    hips/wrists/back   Cataract    sx-bilateral   Chronic kidney disease    stage 3   Diabetes mellitus without complication (HCC)    on meds   Hypertension    on meds   Iron deficiency    on oral iron supplements, has not required transfusion   Mixed hyperlipidemia    on meds   OSA on CPAP    compliant   Sleep apnea    uses CPAP   Thyroid nodule    removed-not on meds at this time (12/01/2020)    Family History  Problem Relation Age of Onset   Stroke Mother    Hypertension Mother    Heart disease Mother    Heart attack Father    Early death Father    Hypertension Father    Diabetes Sister    Early  death Sister    Hypertension Sister    Stroke Sister    Prostate cancer Son    Cancer Son    Early death Son    Breast cancer Cousin    Diabetes Sister    Early death Sister    Hyperlipidemia Sister    Kidney disease Sister    Depression Daughter    Hypertension Daughter    Hyperlipidemia Daughter    Diabetes Sister    Hyperlipidemia Sister    Hypertension Sister    Early death Brother    Arthritis Brother    Heart attack Brother    Heart disease Brother    Arthritis Brother    Diabetes Sister    Colon polyps Neg Hx    Colon cancer Neg Hx    Esophageal cancer Neg Hx    Rectal cancer Neg Hx    Stomach cancer Neg Hx     Past Surgical History:  Procedure Laterality Date   ABDOMINAL HYSTERECTOMY  1996   MASS EXCISION Right 2017   right arm mass removal   THYROID SURGERY      left side removal benign nodule   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   Procedure: RIGHT TOTAL HIP ARTHROPLASTY ANTERIOR APPROACH;  Surgeon: Leandrew Koyanagi, MD;  Location: [facility];  Service: Orthopedics;  Laterality: Right;  3-C   Social History   Occupational History   Occupation: Retired   Tobacco Use   Smoking status: Former    Packs/day: 0.25    Years: 1.00    Pack years: 0.25    Types: Cigarettes    Quit date: 1960    Years since quitting: 62.8   Smokeless tobacco: Never   Tobacco comments:    only socially in high school  Vaping Use   Vaping Use: Never used  Substance and Sexual Activity   Alcohol use: Not Currently   Drug use: Never   Sexual activity: Not on file

## 2021-08-22 ENCOUNTER — Other Ambulatory Visit: Payer: Self-pay | Admitting: Physician Assistant

## 2021-08-27 ENCOUNTER — Other Ambulatory Visit: Payer: Self-pay | Admitting: Physician Assistant

## 2021-09-18 ENCOUNTER — Ambulatory Visit (INDEPENDENT_AMBULATORY_CARE_PROVIDER_SITE_OTHER): Payer: Medicare Other

## 2021-09-18 ENCOUNTER — Ambulatory Visit (INDEPENDENT_AMBULATORY_CARE_PROVIDER_SITE_OTHER): Payer: Medicare Other | Admitting: Orthopaedic Surgery

## 2021-09-18 ENCOUNTER — Other Ambulatory Visit: Payer: Self-pay

## 2021-09-18 ENCOUNTER — Telehealth: Payer: Self-pay | Admitting: *Deleted

## 2021-09-18 ENCOUNTER — Other Ambulatory Visit: Payer: Self-pay | Admitting: Physician Assistant

## 2021-09-18 DIAGNOSIS — Z1231 Encounter for screening mammogram for malignant neoplasm of breast: Secondary | ICD-10-CM

## 2021-09-18 DIAGNOSIS — Z96641 Presence of right artificial hip joint: Secondary | ICD-10-CM

## 2021-09-18 NOTE — Telephone Encounter (Signed)
Pharmacy requesting alternative for Vyvanse 40 mg, Suggested Alternative Methylphenidate (CD) 10 mg cap #30.   PA required for Vyvanse. Please advise.

## 2021-09-18 NOTE — Telephone Encounter (Signed)
Incoming fax from [facility] requesting refill of lumigan. Last refilled 09/16/2018 by Briscoe Deutscher.

## 2021-09-18 NOTE — Telephone Encounter (Signed)
Please disregard first message about Vyvanse, error.

## 2021-09-18 NOTE — Telephone Encounter (Signed)
Patient notified and verbalized understanding. 

## 2021-09-18 NOTE — Progress Notes (Signed)
Post-Op Visit Note   Patient: Catherine Mcconnell           Date of Birth: [birth year]           MRN: [id number] Visit Date: 09/18/2021 PCP: Inda Coke, PA   Assessment & Plan:  Chief Complaint:  Chief Complaint  Patient presents with   Right Hip - Routine Post Op    Right hip replacement 08/2021   Visit Diagnoses:  1. Status post total replacement of right hip     Plan: [name] is 6 weeks status post right total hip replacement on 08/06/2021.  She is doing well overall and has no complaints today.  She has been released from physical therapy.  Right hip scar is fully healed.  No signs of infection.  Excellent range of motion of the hip without pain.  She is walking with a Rollator in public and often does not use anything at home.  Her x-rays that demonstrate stable total hip replacement without any complications.  From my standpoint she is doing very well and she can begin to increase her activity and strengthening as she feels comfortable.  Dental prophylaxis reinforced.  I would like to recheck her in 6 weeks.  Follow-Up Instructions: Return in about 6 weeks (around 10/30/2021).   Orders:  Orders Placed This Encounter  Procedures   XR Pelvis 1-2 Views   No orders of the defined types were placed in this encounter.   Imaging: XR Pelvis 1-2 Views  Result Date: 09/18/2021 Stable total hip replacement without complications   PMFS History: Patient Active Problem List   Diagnosis Date Noted   Status post total replacement of right hip 08/06/2021   Preop cardiovascular exam 06/04/2021   Avascular necrosis of bone of right hip ([place]) 05/02/2021   Type 2 diabetes mellitus with hyperglycemia, without long-term current use of insulin ([place]) 09/11/2020   Type 2 diabetes mellitus with diabetic polyneuropathy, without long-term current use of insulin ([place]) 09/11/2020   Type 2 diabetes mellitus with stage 3a chronic kidney disease, without long-term current use of insulin  ([place]) 09/11/2020   Type 2 diabetes mellitus with hyperglycemia, with long-term current use of insulin ([place]) 06/06/2020   Memory deficits 03/16/2020   Diabetes mellitus without complication ([place]) 24/23/5361   OSA on CPAP    Chronic kidney disease    Essential hypertension    Mixed hyperlipidemia    Past Medical History:  Diagnosis Date   Anemia    on meds   Arthritis    hips/wrists/back   Cataract    sx-bilateral   Chronic kidney disease    stage 3   Diabetes mellitus without complication ([place])    on meds   Hypertension    on meds   Iron deficiency    on oral iron supplements, has not required transfusion   Mixed hyperlipidemia    on meds   OSA on CPAP    compliant   Sleep apnea    uses CPAP   Thyroid nodule    removed-not on meds at this time (12/01/2020)    Family History  Problem Relation Age of Onset   Stroke Mother    Hypertension Mother    Heart disease Mother    Heart attack Father    Early death Father    Hypertension Father    Diabetes Sister    Early death Sister    Hypertension Sister    Stroke Sister    Prostate cancer Son  Cancer Son    Early death Son    Breast cancer Cousin    Diabetes Sister    Early death Sister    Hyperlipidemia Sister    Kidney disease Sister    Depression Daughter    Hypertension Daughter    Hyperlipidemia Daughter    Diabetes Sister    Hyperlipidemia Sister    Hypertension Sister    Early death Brother    Arthritis Brother    Heart attack Brother    Heart disease Brother    Arthritis Brother    Diabetes Sister    Colon polyps Neg Hx    Colon cancer Neg Hx    Esophageal cancer Neg Hx    Rectal cancer Neg Hx    Stomach cancer Neg Hx     Past Surgical History:  Procedure Laterality Date   ABDOMINAL HYSTERECTOMY  1996   MASS EXCISION Right 2017   right arm mass removal   THYROID SURGERY     left side removal benign nodule   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   Procedure: RIGHT TOTAL HIP  ARTHROPLASTY ANTERIOR APPROACH;  Surgeon: Leandrew Koyanagi, MD;  Location: [facility];  Service: Orthopedics;  Laterality: Right;  3-C   Social History   Occupational History   Occupation: Retired   Tobacco Use   Smoking status: Former    Packs/day: 0.25    Years: 1.00    Pack years: 0.25    Types: Cigarettes    Quit date: 1960    Years since quitting: 62.9   Smokeless tobacco: Never   Tobacco comments:    only socially in high school  Vaping Use   Vaping Use: Never used  Substance and Sexual Activity   Alcohol use: Not Currently   Drug use: Never   Sexual activity: Not on file

## 2021-10-01 ENCOUNTER — Other Ambulatory Visit: Payer: Self-pay

## 2021-10-01 ENCOUNTER — Ambulatory Visit (INDEPENDENT_AMBULATORY_CARE_PROVIDER_SITE_OTHER): Payer: Medicare Other

## 2021-10-01 VITALS — BP 126/68 | HR 71 | Temp 97.3°F | Wt 152.6 lb

## 2021-10-01 DIAGNOSIS — Z0000 Encounter for general adult medical examination without abnormal findings: Secondary | ICD-10-CM | POA: Diagnosis not present

## 2021-10-01 NOTE — Progress Notes (Addendum)
Subjective:   Piya Mesch is a 75 y.o. female who presents for Medicare Annual (Subsequent) preventive examination.  Review of Systems     Cardiac Risk Factors include: advanced age (>7men, >42 women);hypertension;dyslipidemia;diabetes mellitus     Objective:    Today's Vitals   10/01/21 0931 10/01/21 0938  BP:  126/68  Pulse:  71  Temp:  (!) 97.3 F (36.3 C)  SpO2:  99%  Weight:  152 lb 9.6 oz (69.2 kg)  PainSc: 0-No pain    Body mass index is 24.63 kg/m.  Advanced Directives 10/01/2021 08/01/2021 05/08/2021 04/23/2021 03/05/2021 09/15/2020 07/05/2020  Does Patient Have a Medical Advance Directive? Yes No Yes Yes Yes Yes No  Type of [employer] [place];Living will [place];Living will [place];Living will [place];Living will -  Does patient want to make changes to medical advance directive? - - - No - Patient declined - - -  Copy of [place] in Chart? No - copy requested - - - No - copy requested No - copy requested -  Would patient like information on creating a medical advance directive? - No - Patient declined - - No - Patient declined - No - Patient declined    Current Medications (verified) Outpatient Encounter Medications as of 10/01/2021  Medication Sig   amLODipine (NORVASC) 10 MG tablet Take 1 tablet (10 mg total) by mouth daily.   atorvastatin (LIPITOR) 80 MG tablet Take 1 tablet (80 mg total) by mouth daily.   carvedilol (COREG) 6.25 MG tablet Take 1 tablet by mouth twice a day with meals   docusate sodium (COLACE) 100 MG capsule Take 1 capsule (100 mg total) by mouth daily as needed.   donepezil (ARICEPT ODT) 10 MG disintegrating tablet Take 1 tablet (10 mg total) by mouth at bedtime.   ferrous sulfate (IRON SUPPLEMENT) 325 (65 FE) MG tablet Take 1 tablet (325 mg total) by mouth 2 (two) times daily with a meal.    gabapentin (NEURONTIN) 100 MG capsule Take 3 capsules by mouth every day (Patient taking differently: Take 300 mg by mouth at bedtime.)   insulin degludec (TRESIBA FLEXTOUCH) 100 UNIT/ML FlexTouch Pen Inject 10 Units into the skin daily.   Insulin Pen Needle (TRUEPLUS PEN NEEDLES) 32G X 4 MM MISC 1 Device by Other route daily.   Lancets (ONETOUCH ULTRASOFT) lancets CHECK TWICE PER DAY BEFORE  BREAKFAST AND DINNER   losartan (COZAAR) 50 MG tablet Take 1 tablet (50 mg total) by mouth daily.   LUMIGAN 0.01 % SOLN Place 1 drop into both eyes every evening.   ondansetron (ZOFRAN) 4 MG tablet Take 1 tablet (4 mg total) by mouth every 8 (eight) hours as needed for nausea or vomiting.   ONETOUCH ULTRA test strip USE TO TEST BLOOD SUGARS  TWICE DAILY   oxyCODONE-acetaminophen (PERCOCET) 5-325 MG tablet Take 1-2 tablets by mouth every 6 (six) hours as needed. To be taken after surgery   Semaglutide (RYBELSUS) 14 MG TABS Take 1 tablet by mouth daily.   methocarbamol (ROBAXIN) 500 MG tablet Take 1 tablet (500 mg total) by mouth 2 (two) times daily as needed. To be taken after surgery (Patient not taking: Reported on 10/01/2021)   [DISCONTINUED] aspirin EC 81 MG tablet Take 1 tablet (81 mg total) by mouth 2 (two) times daily. To be taken after surgery to prevent blood clots   [DISCONTINUED] atorvastatin (LIPITOR)  40 MG tablet Take 1 tablet by mouth every day   [DISCONTINUED] cephALEXin (KEFLEX) 500 MG capsule Take 1 capsule (500 mg total) by mouth 4 (four) times daily. To be taken after surgery   No facility-administered encounter medications on file as of 10/01/2021.    Allergies (verified) Lisinopril and Metformin and related   History: Past Medical History:  Diagnosis Date   Anemia    on meds   Arthritis    hips/wrists/back   Cataract    sx-bilateral   Chronic kidney disease    stage 3   Diabetes mellitus without complication (HCC)    on meds   Hypertension    on meds   Iron deficiency     on oral iron supplements, has not required transfusion   Mixed hyperlipidemia    on meds   OSA on CPAP    compliant   Sleep apnea    uses CPAP   Thyroid nodule    removed-not on meds at this time (12/01/2020)   Past Surgical History:  Procedure Laterality Date   ABDOMINAL HYSTERECTOMY  1996   MASS EXCISION Right 2017   right arm mass removal   THYROID SURGERY     left side removal benign nodule   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   Procedure: RIGHT TOTAL HIP ARTHROPLASTY ANTERIOR APPROACH;  Surgeon: Leandrew Koyanagi, MD;  Location: [facility];  Service: Orthopedics;  Laterality: Right;  3-C   Family History  Problem Relation Age of Onset   Stroke Mother    Hypertension Mother    Heart disease Mother    Heart attack Father    Early death Father    Hypertension Father    Diabetes Sister    Early death Sister    Hypertension Sister    Stroke Sister    Prostate cancer Son    Cancer Son    Early death Son    Breast cancer Cousin    Diabetes Sister    Early death Sister    Hyperlipidemia Sister    Kidney disease Sister    Depression Daughter    Hypertension Daughter    Hyperlipidemia Daughter    Diabetes Sister    Hyperlipidemia Sister    Hypertension Sister    Early death Brother    Arthritis Brother    Heart attack Brother    Heart disease Brother    Arthritis Brother    Diabetes Sister    Colon polyps Neg Hx    Colon cancer Neg Hx    Esophageal cancer Neg Hx    Rectal cancer Neg Hx    Stomach cancer Neg Hx    Social History   Socioeconomic History   Marital status: Divorced    Spouse name: Not on file   Number of children: 6   Years of education: some college   Highest education level: Not on file  Occupational History   Occupation: Retired   Tobacco Use   Smoking status: Former    Packs/day: 0.25    Years: 1.00    Pack years: 0.25    Types: Cigarettes    Quit date: 1960    Years since quitting: 63.0   Smokeless tobacco: Never   Tobacco  comments:    only socially in high school  Vaping Use   Vaping Use: Never used  Substance and Sexual Activity   Alcohol use: Not Currently   Drug use: Never   Sexual activity: Not on file  Other Topics  Concern   Not on file  Social History Narrative   03/16/20 lives with dgtr Weber Cooks   Mother of 6   --Two children have passed, one from from prostate cancer and one from liver failure   Used to be a CNA, Child psychotherapist   Recently moved back to [facility]   Financial Resource Strain: Low Risk    Difficulty of Paying Living Expenses: Not hard at all  Food Insecurity: No Food Insecurity   Worried About Charity fundraiser in the Last Year: Never true   Arboriculturist in the Last Year: Never true  Transportation Needs: No Transportation Needs   Lack of Transportation (Medical): No   Lack of Transportation (Non-Medical): No  Physical Activity: Inactive   Days of Exercise per Week: 0 days   Minutes of Exercise per Session: 0 min  Stress: No Stress Concern Present   Feeling of Stress : Not at all  Social Connections: Socially Isolated   Frequency of Communication with Friends and Family: Once a week   Frequency of Social Gatherings with Friends and Family: Once a week   Attends Religious Services: More than 4 times per year   Active Member of [employer] or Organizations: No   Attends Music therapist: Never   Marital Status: Divorced    Tobacco Counseling Counseling given: Not Answered Tobacco comments: only socially in high school   Clinical Intake:  Pre-visit preparation completed: Yes  Pain : No/denies pain Pain Score: 0-No pain     BMI - recorded: 24.63 Nutritional Status: BMI 25 -29 Overweight Nutritional Risks: None Diabetes: Yes CBG done?: Yes (149) CBG resulted in Enter/ Edit results?: No Did pt. bring in CBG monitor from home?: No  How often do you need to have someone help you when you read instructions, pamphlets, or other  written materials from your doctor or pharmacy?: 1 - Never  Diabetic?Nutrition Risk Assessment:  Has the patient had any N/V/D within the last 2 months?  No  Does the patient have any non-healing wounds?  No  Has the patient had any unintentional weight loss or weight gain?  No   Diabetes:  Is the patient diabetic?  Yes  If diabetic, was a CBG obtained today?  Yes  Did the patient bring in their glucometer from home?  No  How often do you monitor your CBG's? Daily .   Financial Strains and Diabetes Management:  Are you having any financial strains with the device, your supplies or your medication? No .  Does the patient want to be seen by Chronic Care Management for management of their diabetes?  No  Would the patient like to be referred to a Nutritionist or for Diabetic Management?  No   Diabetic Exams:  Diabetic Eye Exam: Completed 11/28/20 Diabetic Foot Exam: Overdue, Pt has been advised about the importance in completing this exam. Pt is scheduled for diabetic foot exam on next appt .   Interpreter Needed?: No  Information entered by :: Charlott Rakes, LPN   Activities of Daily Living In your present state of health, do you have any difficulty performing the following activities: 10/01/2021 08/01/2021  Hearing? N N  Vision? N N  Difficulty concentrating or making decisions? N N  Walking or climbing stairs? N N  Dressing or bathing? N Y  Doing errands, shopping? N N  Preparing Food and eating ? N -  Using the Toilet? N -  In the  past six months, have you accidently leaked urine? N -  Do you have problems with loss of bowel control? N -  Managing your Medications? N -  Managing your Finances? N -  Housekeeping or managing your Housekeeping? N -  Some recent data might be hidden    Patient Care Team: Inda Coke, [place] as PCP - General (Physician Assistant) Nigel Mormon, MD as Consulting Physician (Cardiology) [name], MD as Consulting Physician  (Nephrology) Minette Brine, FNP (General Practice) Dohmeier, Asencion Partridge, MD as Consulting Physician (Neurology) [facility] as Consulting Physician  Indicate any recent Medical Services you may have received from other than [facility] providers in the past year (date may be approximate).     Assessment:   This is a routine wellness examination for [place].  Hearing/Vision screen Hearing Screening - Comments:: Pt denies any hearing issues  Vision Screening - Comments:: Pt follows up with Dr Katy Fitch for annual eye exams   Dietary issues and exercise activities discussed: Current Exercise Habits: The patient does not participate in regular exercise at present   Goals Addressed             This Visit's Progress    Patient Stated       Be healthier than I am        Depression Screen PHQ 2/9 Scores 10/01/2021 05/10/2021 09/15/2020 08/09/2019 08/13/2018  PHQ - 2 Score 0 0 0 0 0    Fall Risk Fall Risk  10/01/2021 05/10/2021 09/15/2020 03/16/2020 08/09/2019  Falls in the past year? 0 0 1 0 0  Number falls in past yr: 0 0 1 - -  Injury with Fall? 0 0 1 - 0  Comment - - scrape on left knee - -  Risk for fall due to : Impaired vision Impaired mobility Impaired vision - -  Follow up Falls prevention discussed - Falls prevention discussed - Falls evaluation completed;Education provided;Falls prevention discussed    FALL RISK PREVENTION PERTAINING TO THE HOME:  Any stairs in or around the home? Yes  If so, are there any without handrails? No  Home free of loose throw rugs in walkways, pet beds, electrical cords, etc? Yes  Adequate lighting in your home to reduce risk of falls? Yes   ASSISTIVE DEVICES UTILIZED TO PREVENT FALLS:  Life alert? Yes  Use of a cane, walker or w/c? Yes  Grab bars in the bathroom? No  Shower chair or bench in shower? Yes  Elevated toilet seat or a handicapped toilet? Yes  TIMED UP AND GO:  Was the test performed? Yes .  Length of time to  ambulate 10 feet: 10 sec.   Gait steady and fast without use of assistive device  Cognitive Function: MMSE - Mini Mental State Exam 04/12/2021 02/11/2020 02/11/2020  Orientation to time 5 5 5   Orientation to Place 5 5 5   Registration 3 - -  Attention/ Calculation 2 5 5   Recall 3 - 1  Language- name 2 objects 2 - -  Language- repeat 1 - -  Language- follow 3 step command 3 3 -  Language- read & follow direction 1 - -  Write a sentence 1 1 1   Copy design 1 - -  Total score 27 - -   Montreal Cognitive Assessment  01/02/2021 07/17/2020 03/16/2020  Visuospatial/ Executive (0/5) 5 5 4   Naming (0/3) 3 3 3   Attention: Read list of digits (0/2) 0 2 1  Attention: Read list of letters (0/1) 1  1 1  Attention: Serial 7 subtraction starting at 100 (0/3) 1 2 3   Language: Repeat phrase (0/2) 2 2 2   Language : Fluency (0/1) 1 0 1  Abstraction (0/2) 2 2 2   Delayed Recall (0/5) 1 2 1   Orientation (0/6) 6 6 6   Total 22 25 24   Adjusted Score (based on education) - 25 -   6CIT Screen 10/01/2021 09/15/2020 08/09/2019  What Year? 0 points 0 points 0 points  What month? 0 points 0 points 0 points  What time? 3 points - 0 points  Count back from 20 0 points 0 points 0 points  Months in reverse 0 points 0 points 0 points  Repeat phrase 2 points 2 points 0 points  Total Score 5 - 0    Immunizations Immunization History  Administered Date(s) Administered   Fluad Quad(high Dose 65+) 07/02/2019, 07/24/2020, 07/19/2021   Influenza, High Dose Seasonal PF 07/14/2018   PFIZER Comirnaty(Gray Top)Covid-19 Tri-Sucrose Vaccine 11/07/2020   PFIZER(Purple Top)SARS-COV-2 Vaccination 01/20/2020, 02/14/2020   Pneumococcal Conjugate-13 07/24/2020   Pneumococcal Polysaccharide-23 11/13/2018   Tdap 08/05/2018   Zoster Recombinat (Shingrix) 06/05/2018, 08/05/2018    TDAP status: Up to date  Flu Vaccine status: Up to date  Pneumococcal vaccine status: Up to date  Covid-19 vaccine status: Completed  vaccines  Qualifies for Shingles Vaccine? Yes   Zostavax completed Yes   Shingrix Completed?: Yes  Screening Tests Health Maintenance  Topic Date Due   COVID-19 Vaccine (4 - Booster for Pfizer series) 01/02/2021   FOOT EXAM  01/18/2021   OPHTHALMOLOGY EXAM  11/28/2021   HEMOGLOBIN A1C  01/08/2022   TETANUS/TDAP  08/05/2028   COLONOSCOPY (Pts 45-61yrs Insurance coverage will need to be confirmed)  12/16/2030   Pneumonia Vaccine 78+ Years old  Completed   INFLUENZA VACCINE  Completed   DEXA SCAN  Completed   Hepatitis C Screening  Completed   Zoster Vaccines- Shingrix  Completed   HPV VACCINES  Aged Out    Health Maintenance  Health Maintenance Due  Topic Date Due   COVID-19 Vaccine (4 - Booster for [place] series) 01/02/2021   FOOT EXAM  01/18/2021    Colorectal cancer screening: Type of screening: Colonoscopy. Completed 12/15/20. Repeat every 10 years  Mammogram status: Completed 09/11/20. Repeat every year  Bone Density status: Completed 08/12/18. Results reflect: Bone density results: NORMAL. Repeat every 2 years.  Additional Screening:  Hepatitis C Screening:  Completed 01/19/20  Vision Screening: Recommended annual ophthalmology exams for early detection of glaucoma and other disorders of the eye. Is the patient up to date with their annual eye exam?  Yes  Who is the provider or what is the name of the office in which the patient attends annual eye exams? Dr Katy Fitch  If pt is not established with a provider, would they like to be referred to a provider to establish care? No .   Dental Screening: Recommended annual dental exams for proper oral hygiene  Community Resource Referral / Chronic Care Management: CRR required this visit?  No   CCM required this visit?  No      Plan:     I have personally reviewed and noted the following in the patients chart:   Medical and social history Use of alcohol, tobacco or illicit drugs  Current medications and supplements  including opioid prescriptions.  Functional ability and status Nutritional status Physical activity Advanced directives List of other physicians Hospitalizations, surgeries, and ER visits in previous 12 months Vitals Screenings  to include cognitive, depression, and falls Referrals and appointments  In addition, I have reviewed and discussed with patient certain preventive protocols, quality metrics, and best practice recommendations. A written personalized care plan for preventive services as well as general preventive health recommendations were provided to patient.     Willette Brace, LPN   [id number]   Nurse Notes: none

## 2021-10-01 NOTE — Patient Instructions (Signed)
Ms. Catherine Mcconnell , Thank you for taking time to come for your Medicare Wellness Visit. I appreciate your ongoing commitment to your health goals. Please review the following plan we discussed and let me know if I can assist you in the future.   Screening recommendations/referrals: Colonoscopy: Done 12/15/20 repeat every 10 years  Mammogram: Done 09/11/20 repeat every year  Bone Density: Done 08/12/18 repeat every 2 years  Recommended yearly ophthalmology/optometry visit for glaucoma screening and checkup Recommended yearly dental visit for hygiene and checkup  Vaccinations: Influenza vaccine: Done 07/19/21 repeat every year  Pneumococcal vaccine: Up to date Tdap vaccine: Done 08/05/18 repeat every 10 years  Shingles vaccine: Completed 8/23 & 08/05/18   Covid-19:Completed 4/8, 02/14/20 & 11/07/20  Advanced directives: Please bring a copy of your health care power of attorney and living will to the office at your convenience.   Conditions/risks identified: Get healthier   Next appointment: Follow up in one year for your annual wellness visit    Preventive Care 65 Years and Older, Female Preventive care refers to lifestyle choices and visits with your health care provider that can promote health and wellness. What does preventive care include? A yearly physical exam. This is also called an annual well check. Dental exams once or twice a year. Routine eye exams. Ask your health care provider how often you should have your eyes checked. Personal lifestyle choices, including: Daily care of your teeth and gums. Regular physical activity. Eating a healthy diet. Avoiding tobacco and drug use. Limiting alcohol use. Practicing safe sex. Taking low-dose aspirin every day. Taking vitamin and mineral supplements as recommended by your health care provider. What happens during an annual well check? The services and screenings done by your health care provider during your annual well check will depend on  your age, overall health, lifestyle risk factors, and family history of disease. Counseling  Your health care provider may ask you questions about your: Alcohol use. Tobacco use. Drug use. Emotional well-being. Home and relationship well-being. Sexual activity. Eating habits. History of falls. Memory and ability to understand (cognition). Work and work Statistician. Reproductive health. Screening  You may have the following tests or measurements: Height, weight, and BMI. Blood pressure. Lipid and cholesterol levels. These may be checked every 5 years, or more frequently if you are over 61 years old. Skin check. Lung cancer screening. You may have this screening every year starting at age 94 if you have a 30-pack-year history of smoking and currently smoke or have quit within the past 15 years. Fecal occult blood test (FOBT) of the stool. You may have this test every year starting at age 27. Flexible sigmoidoscopy or colonoscopy. You may have a sigmoidoscopy every 5 years or a colonoscopy every 10 years starting at age 12. Hepatitis C blood test. Hepatitis B blood test. Sexually transmitted disease (STD) testing. Diabetes screening. This is done by checking your blood sugar (glucose) after you have not eaten for a while (fasting). You may have this done every 1-3 years. Bone density scan. This is done to screen for osteoporosis. You may have this done starting at age 61. Mammogram. This may be done every 1-2 years. Talk to your health care provider about how often you should have regular mammograms. Talk with your health care provider about your test results, treatment options, and if necessary, the need for more tests. Vaccines  Your health care provider may recommend certain vaccines, such as: Influenza vaccine. This is recommended every year. Tetanus, diphtheria,  and acellular pertussis (Tdap, Td) vaccine. You may need a Td booster every 10 years. Zoster vaccine. You may need this  after age 64. Pneumococcal 13-valent conjugate (PCV13) vaccine. One dose is recommended after age 6. Pneumococcal polysaccharide (PPSV23) vaccine. One dose is recommended after age 83. Talk to your health care provider about which screenings and vaccines you need and how often you need them. This information is not intended to replace advice given to you by your health care provider. Make sure you discuss any questions you have with your health care provider. Document Released: 10/27/2015 Document Revised: 06/19/2016 Document Reviewed: 08/01/2015 Elsevier Interactive Patient Education  2017 [employer] Prevention in the Home Falls can cause injuries. They can happen to people of all ages. There are many things you can do to make your home safe and to help prevent falls. What can I do on the outside of my home? Regularly fix the edges of walkways and driveways and fix any cracks. Remove anything that might make you trip as you walk through a door, such as a raised step or threshold. Trim any bushes or trees on the path to your home. Use bright outdoor lighting. Clear any walking paths of anything that might make someone trip, such as rocks or tools. Regularly check to see if handrails are loose or broken. Make sure that both sides of any steps have handrails. Any raised decks and porches should have guardrails on the edges. Have any leaves, snow, or ice cleared regularly. Use sand or salt on walking paths during winter. Clean up any spills in your garage right away. This includes oil or grease spills. What can I do in the bathroom? Use night lights. Install grab bars by the toilet and in the tub and shower. Do not use towel bars as grab bars. Use non-skid mats or decals in the tub or shower. If you need to sit down in the shower, use a plastic, non-slip stool. Keep the floor dry. Clean up any water that spills on the floor as soon as it happens. Remove soap buildup in the tub or  shower regularly. Attach bath mats securely with double-sided non-slip rug tape. Do not have throw rugs and other things on the floor that can make you trip. What can I do in the bedroom? Use night lights. Make sure that you have a light by your bed that is easy to reach. Do not use any sheets or blankets that are too big for your bed. They should not hang down onto the floor. Have a firm chair that has side arms. You can use this for support while you get dressed. Do not have throw rugs and other things on the floor that can make you trip. What can I do in the kitchen? Clean up any spills right away. Avoid walking on wet floors. Keep items that you use a lot in easy-to-reach places. If you need to reach something above you, use a strong step stool that has a grab bar. Keep electrical cords out of the way. Do not use floor polish or wax that makes floors slippery. If you must use wax, use non-skid floor wax. Do not have throw rugs and other things on the floor that can make you trip. What can I do with my stairs? Do not leave any items on the stairs. Make sure that there are handrails on both sides of the stairs and use them. Fix handrails that are broken or loose. Make sure  that handrails are as long as the stairways. Check any carpeting to make sure that it is firmly attached to the stairs. Fix any carpet that is loose or worn. Avoid having throw rugs at the top or bottom of the stairs. If you do have throw rugs, attach them to the floor with carpet tape. Make sure that you have a light switch at the top of the stairs and the bottom of the stairs. If you do not have them, ask someone to add them for you. What else can I do to help prevent falls? Wear shoes that: Do not have high heels. Have rubber bottoms. Are comfortable and fit you well. Are closed at the toe. Do not wear sandals. If you use a stepladder: Make sure that it is fully opened. Do not climb a closed stepladder. Make  sure that both sides of the stepladder are locked into place. Ask someone to hold it for you, if possible. Clearly mark and make sure that you can see: Any grab bars or handrails. First and last steps. Where the edge of each step is. Use tools that help you move around (mobility aids) if they are needed. These include: Canes. Walkers. Scooters. Crutches. Turn on the lights when you go into a dark area. Replace any light bulbs as soon as they burn out. Set up your furniture so you have a clear path. Avoid moving your furniture around. If any of your floors are uneven, fix them. If there are any pets around you, be aware of where they are. Review your medicines with your doctor. Some medicines can make you feel dizzy. This can increase your chance of falling. Ask your doctor what other things that you can do to help prevent falls. This information is not intended to replace advice given to you by your health care provider. Make sure you discuss any questions you have with your health care provider. Document Released: 07/27/2009 Document Revised: 03/07/2016 Document Reviewed: 11/04/2014 Elsevier Interactive Patient Education  2017 [employer].

## 2021-10-16 LAB — CBC AND DIFFERENTIAL
HCT: 33 — AB (ref 36–46)
Hemoglobin: 11.4 — AB (ref 12.0–16.0)
Neutrophils Absolute: 1.4
Platelets: 204 (ref 150–399)
WBC: 3

## 2021-10-16 LAB — COMPREHENSIVE METABOLIC PANEL
Albumin: 4.2 (ref 3.5–5.0)
Calcium: 8.7 (ref 8.7–10.7)
GFR calc Af Amer: 38

## 2021-10-16 LAB — BASIC METABOLIC PANEL WITH GFR
BUN: 19 (ref 4–21)
CO2: 27 — AB (ref 13–22)
Chloride: 99 (ref 99–108)
Creatinine: 1.4 — AB (ref 0.5–1.1)
Glucose: 159
Potassium: 3.3 — AB (ref 3.4–5.3)
Sodium: 138 (ref 137–147)

## 2021-10-16 LAB — CBC: RBC: 3.77 — AB (ref 3.87–5.11)

## 2021-10-18 ENCOUNTER — Other Ambulatory Visit: Payer: Self-pay

## 2021-10-18 ENCOUNTER — Ambulatory Visit
Admission: RE | Admit: 2021-10-18 | Discharge: 2021-10-18 | Disposition: A | Discharge status: Home / Self Care | Payer: Medicare Other | Source: Ambulatory Visit

## 2021-10-18 DIAGNOSIS — Z1231 Encounter for screening mammogram for malignant neoplasm of breast: Secondary | ICD-10-CM

## 2021-10-24 ENCOUNTER — Ambulatory Visit: Payer: Medicare Other | Admitting: Adult Health

## 2021-10-24 ENCOUNTER — Encounter: Payer: Self-pay | Admitting: Adult Health

## 2021-10-24 VITALS — BP 122/69 | HR 59 | Ht 66.0 in | Wt 151.2 lb

## 2021-10-24 DIAGNOSIS — R413 Other amnesia: Secondary | ICD-10-CM

## 2021-10-24 DIAGNOSIS — Z9989 Dependence on other enabling machines and devices: Secondary | ICD-10-CM | POA: Diagnosis not present

## 2021-10-24 DIAGNOSIS — G4733 Obstructive sleep apnea (adult) (pediatric): Secondary | ICD-10-CM

## 2021-10-24 NOTE — Progress Notes (Signed)
PATIENT: Catherine Mcconnell DOB: 01/25/1946  REASON FOR VISIT: follow up HISTORY FROM: patient Primary neurologist: Dr. Brett Fairy  HISTORY OF PRESENT ILLNESS: Today 10/24/21:  Catherine Mcconnell is a 76 year old female with a history of obstructive sleep apnea on CPAP and memory disturbance.  She returns today for follow-up.  She is with her daughter.  Patient states that she does not sleep all night long.  States that she typically wakes up and stays up for several hours.  If she goes back to sleep during the day she does not use a CPAP.  Patient had hip replacement in October and daughter states that her schedule has been altered since then.  Patient feels that her memory has remained relatively stable.  She lives with her daughter.  Daughter helps her with her medications.  She reports that she manages her own finances.  She continues on Aricept 10 mg at bedtime.      04/12/21: Catherine Mcconnell is a 76 year old female with a history of obstructive sleep apnea on CPAP and memory disturbance.  She returns today for follow-up.  She reports that the CPAP works well.  She denies any new issues.  She feels that her memory has remained stable.  She is able to complete all ADLs independently.  She lives with her daughter.  She manages her own medications and appointments.  She is able to operate a motor vehicle.  She currently takes Aricept 5 mg at bedtime.    01/02/21: Catherine Mcconnell  is a 76 year old female with a history of obstructive sleep apnea and memory disturbance.  She returns today for follow-up.  She reports that her memory has been relatively stable.  She does not feel that it is gotten any worse.  She lives with her daughter.  Her daughter was unable to attend this visit.  She states that on occasion she feels brain fog but this is inconsistent versus consistent.  She is able to complete all ADLs independently.  She does operate a Teacher, music.  Denies any trouble driving but she reports that she does use a GPS  consistently to be safe.  She denies any troubles managing her finances.  Reports that she continues to cook meals without difficulty.  She is currently on no medication for her memory.  She continues to use the CPAP consistently.  HISTORY (Copied from Dr.Dohmeier's note) New problem for this established sleep patient, last seen by NP- Televisit.  Catherine Mcconnell is a 76 year- old african -[place] . female is seen on 03-16-2020 with her daughter. According to her daughter Catherine Mcconnell has forgotten to pay her phone bill which has never happened before, she has gotten lost driving in town on familiar roads.  There were no aggravating factors such as bad weather or nighttime driving.  These events have left Catherine Mcconnell a little bit shaken. Based on these concerns her physician assistant Catherine Mcconnell has sent the patient for follow-up.     The memory loss is accompanied by a rather poorly controlled diabetes still the HbA1c was 8.4 in April of this year, she has a past medical history of chronic kidney disease which is not graded, arthritis, heart iron deficiency, hypertension, mixed hyperlipidemia and she has a history of a thyroid nodule which was surgically removed. She has a past surgical history of has to hysterectomy in 1996 thyroid surgery a biopsy revealed a benign nodule.  And she had a skin mass removed from her right upper arm in  2017 which  was not cancerous.    She is followed here in our sleep clinic for OSA obstructive sleep apnea on CPAP.  Just to stay for another visit she has been 100% compliant by days, 97% compliant by time average use at time of 6 hours 51 minutes, CPAP is set to 11 cm water pressure with 3 cm EPR her residual apnea-hypopnea index is 2.4/h very good she does have some central apneas arising but overall the main problem may be an air leak but not a lack of control of apnea.  95th percentile air leak was 55 L/min which means that the mask gets dislodged sometimes.   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      REVIEW OF SYSTEMS: Out of a complete 14 system review of symptoms, the patient complains only of the following symptoms, and all other reviewed systems are negative.  ESS 5   ALLERGIES: Allergies  Allergen Reactions   Lisinopril Rash   Metformin And Related Swelling    HOME MEDICATIONS: Outpatient Medications Prior to Visit  Medication Sig Dispense Refill   amLODipine (NORVASC) 10 MG tablet Take 1 tablet (10 mg total) by mouth daily. 90 tablet 1   atorvastatin (LIPITOR) 80 MG tablet Take 1 tablet (80 mg total) by mouth daily. 90 tablet 1   carvedilol (COREG) 6.25 MG tablet Take 1 tablet by mouth twice a day with meals 60 tablet 11   docusate sodium (COLACE) 100 MG capsule Take 1 capsule (100 mg total) by mouth daily as needed. 30 capsule 2   donepezil (ARICEPT ODT) 10 MG disintegrating tablet Take 1 tablet (10 mg total) by mouth at bedtime. 90 tablet 1   ferrous sulfate (IRON SUPPLEMENT) 325 (65 FE) MG tablet Take 1 tablet (325 mg total) by mouth 2 (two) times daily with a meal. 60 tablet 2   gabapentin (NEURONTIN) 100 MG capsule Take 3 capsules by mouth every day (Patient taking differently: Take 300 mg by mouth at bedtime.) 90 capsule 11   insulin degludec (TRESIBA FLEXTOUCH) 100 UNIT/ML FlexTouch Pen Inject 10 Units into the skin daily. 15 mL 3   Insulin Pen Needle (TRUEPLUS PEN NEEDLES) 32G X 4 MM MISC 1 Device by Other route daily. 100 each 3   Lancets (ONETOUCH ULTRASOFT) lancets CHECK TWICE PER DAY BEFORE  BREAKFAST AND DINNER 200 each 4   losartan (COZAAR) 50 MG tablet Take 1 tablet (50 mg total) by mouth daily. 90 tablet 1   LUMIGAN 0.01 % SOLN Place 1 drop into both eyes every evening.     methocarbamol (ROBAXIN) 500 MG tablet Take 1 tablet (500 mg total) by  mouth 2 (two) times daily as needed. To be taken after surgery (Patient not taking: Reported on 10/01/2021) 20 tablet 2   ondansetron (ZOFRAN) 4 MG tablet Take 1 tablet (4 mg total) by mouth every 8 (eight) hours as needed for nausea or vomiting. 40 tablet 0   ONETOUCH ULTRA test strip USE TO TEST BLOOD SUGARS  TWICE DAILY 200 strip 3   oxyCODONE-acetaminophen (PERCOCET) 5-325 MG tablet Take 1-2 tablets by mouth every 6 (six) hours as needed. To be taken after surgery 40 tablet 0   Semaglutide (RYBELSUS) 14 MG TABS Take 1 tablet by mouth daily. 90 tablet 3   No facility-administered medications  prior to visit.    PAST MEDICAL HISTORY: Past Medical History:  Diagnosis Date   Anemia    on meds   Arthritis    hips/wrists/back   Cataract    sx-bilateral   Chronic kidney disease    stage 3   Diabetes mellitus without complication (HCC)    on meds   Hypertension    on meds   Iron deficiency    on oral iron supplements, has not required transfusion   Mixed hyperlipidemia    on meds   OSA on CPAP    compliant   Sleep apnea    uses CPAP   Thyroid nodule    removed-not on meds at this time (12/01/2020)    PAST SURGICAL HISTORY: Past Surgical History:  Procedure Laterality Date   ABDOMINAL HYSTERECTOMY  1996   MASS EXCISION Right 2017   right arm mass removal   THYROID SURGERY     left side removal benign nodule   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   Procedure: RIGHT TOTAL HIP ARTHROPLASTY ANTERIOR APPROACH;  Surgeon: Leandrew Koyanagi, MD;  Location: [facility];  Service: Orthopedics;  Laterality: Right;  3-C    FAMILY HISTORY: Family History  Problem Relation Age of Onset   Stroke Mother    Hypertension Mother    Heart disease Mother    Heart attack Father    Early death Father    Hypertension Father    Diabetes Sister    Early death Sister    Hypertension Sister    Stroke Sister    Diabetes Sister    Early death Sister    Hyperlipidemia Sister    Kidney disease  Sister    Diabetes Sister    Hyperlipidemia Sister    Hypertension Sister    Diabetes Sister    Depression Daughter    Hypertension Daughter    Hyperlipidemia Daughter    Early death Brother    Arthritis Brother    Heart attack Brother    Heart disease Brother    Arthritis Brother    Prostate cancer Son    Cancer Son    Early death Son    Colon polyps Neg Hx    Colon cancer Neg Hx    Esophageal cancer Neg Hx    Rectal cancer Neg Hx    Stomach cancer Neg Hx     SOCIAL HISTORY: Social History   Socioeconomic History   Marital status: Divorced    Spouse name: Not on file   Number of children: 6   Years of education: some college   Highest education level: Not on file  Occupational History   Occupation: Retired   Tobacco Use   Smoking status: Former    Packs/day: 0.25    Years: 1.00    Pack years: 0.25    Types: Cigarettes    Quit date: 1960    Years since quitting: 63.0   Smokeless tobacco: Never   Tobacco comments:    only socially in high school  Vaping Use   Vaping Use: Never used  Substance and Sexual Activity   Alcohol use: Not Currently   Drug use: Never   Sexual activity: Not on file  Other Topics Concern   Not on file  Social History Narrative   03/16/20 lives with dgtr Weber Cooks   Mother of 6   --Two children have passed, one from from prostate cancer and one from liver failure   Used to be a Quarry manager, Child psychotherapist  Recently moved back to [facility]   Financial Resource Strain: Low Risk    Difficulty of Paying Living Expenses: Not hard at all  Food Insecurity: No Food Insecurity   Worried About Charity fundraiser in the Last Year: Never true   Arboriculturist in the Last Year: Never true  Transportation Needs: No Transportation Needs   Lack of Transportation (Medical): No   Lack of Transportation (Non-Medical): No  Physical Activity: Inactive   Days of Exercise per Week: 0 days   Minutes of Exercise per Session: 0 min   Stress: No Stress Concern Present   Feeling of Stress : Not at all  Social Connections: Socially Isolated   Frequency of Communication with Friends and Family: Once a week   Frequency of Social Gatherings with Friends and Family: Once a week   Attends Religious Services: More than 4 times per year   Active Member of [employer] or Organizations: No   Attends Archivist Meetings: Never   Marital Status: Divorced  Human resources officer Violence: Not At Risk   Fear of Current or Ex-Partner: No   Emotionally Abused: No   Physically Abused: No   Sexually Abused: No      PHYSICAL EXAM  Vitals:   10/24/21 1114  BP: 122/69  Pulse: (!) 59  Weight: 151 lb 3.2 oz (68.6 kg)  Height: 5\' 6"  (1.676 m)    Body mass index is 24.4 kg/m.   MMSE - Mini Mental State Exam 10/24/2021 04/12/2021 02/11/2020  Orientation to time 3 5 5   Orientation to Place 5 5 5   Registration 3 3 -  Attention/ Calculation 1 2 5   Recall 2 3 -  Language- name 2 objects 2 2 -  Language- repeat 1 1 -  Language- follow 3 step command 3 3 3   Language- read & follow direction 1 1 -  Write a sentence 1 1 1   Copy design 1 1 -  Total score 23 27 -     Montreal Cognitive Assessment  01/02/2021 07/17/2020 03/16/2020  Visuospatial/ Executive (0/5) 5 5 4   Naming (0/3) 3 3 3   Attention: Read list of digits (0/2) 0 2 1  Attention: Read list of letters (0/1) 1 1 1   Attention: Serial 7 subtraction starting at 100 (0/3) 1 2 3   Language: Repeat phrase (0/2) 2 2 2   Language : Fluency (0/1) 1 0 1  Abstraction (0/2) 2 2 2   Delayed Recall (0/5) 1 2 1   Orientation (0/6) 6 6 6   Total 22 25 24   Adjusted Score (based on education) - 25 -    Generalized: Well developed, in no acute distress   Neurological examination  Mentation: Alert oriented to time, place, history taking. Follows all commands speech and language fluent Cranial nerve II-XII: Pupils were equal round reactive to light. Extraocular movements were full, visual  field were full on confrontational test.  Head turning and shoulder shrug  were normal and symmetric. Motor: The motor testing reveals 5 over 5 strength of all 4 extremities. Good symmetric motor tone is noted throughout.  Sensory: Sensory testing is intact to soft touch on all 4 extremities. No evidence of extinction is noted.  Coordination: Cerebellar testing reveals good finger-nose-finger and heel-to-shin bilaterally.  Gait and station: Gait is normal.  Reflexes: Deep tendon reflexes are symmetric and normal bilaterally.   DIAGNOSTIC DATA (LABS, IMAGING, TESTING) - I reviewed patient records, labs, notes, testing and imaging myself  where available.  Lab Results  Component Value Date   WBC 4.7 08/07/2021   HGB 11.0 (L) 08/07/2021   HCT 31.7 (L) 08/07/2021   MCV 88.5 08/07/2021   PLT 160 08/07/2021      Component Value Date/Time   NA 135 08/07/2021 0504   NA 136 (A) 03/30/2020 0000   K 3.5 08/07/2021 0504   CL 98 08/07/2021 0504   CO2 29 08/07/2021 0504   GLUCOSE 88 08/07/2021 0504   BUN 12 08/07/2021 0504   BUN 23 (A) 03/30/2020 0000   CREATININE 1.33 (H) 08/07/2021 0504   CREATININE 1.44 (H) 06/14/2020 1353   CALCIUM 8.7 (L) 08/07/2021 0504   PROT 6.3 (L) 08/01/2021 0902   ALBUMIN 3.7 08/01/2021 0902   AST 33 08/01/2021 0902   ALT 34 08/01/2021 0902   ALKPHOS 48 08/01/2021 0902   BILITOT 1.0 08/01/2021 0902   GFRNONAA 42 (L) 08/07/2021 0504   GFRAA 34 03/30/2020 0000   Lab Results  Component Value Date   CHOL 215 (H) 06/11/2021   HDL 61 06/11/2021   LDLCALC 136 (H) 06/11/2021   TRIG 102 06/11/2021   CHOLHDL 3.5 06/11/2021   Lab Results  Component Value Date   HGBA1C 7.8 (A) 07/11/2021   Lab Results  Component Value Date   VITAMINB12 495 02/11/2020   Lab Results  Component Value Date   TSH 3.48 06/14/2020      ASSESSMENT AND PLAN 76 y.o. year old female  has a past medical history of Anemia, Arthritis, Cataract, Chronic kidney disease, Diabetes  mellitus without complication ([place]), Hypertension, Iron deficiency, Mixed hyperlipidemia, OSA on CPAP, Sleep apnea, and Thyroid nodule. here with:  1.  Memory disturbance  Memory score MMSE 23/30--decreased Continue Aricept to 10 mg at bedtime Discussed starting Namenda however the daughter feels that the patient's poor sleep hygiene may be contributing to decrease in her memory score.  We discussed better sleep hygiene.  We will retest memory in 4 to 5 months.  At that time we will add on Namenda if needed Advised patient if her symptoms worsen or she develops new symptoms she should let us know  2.  Sleep apnea on CPAP  Good compliance Good treatment of her apnea Encouraged her to continue using CPAP nightly and greater than 4 hours each night  Follow-up in 6 months or sooner if needed    Ward Givens, MSN, NP-C 10/24/2021, 11:12 AM [facility] [address], [place], [place] [address] [phone]

## 2021-10-24 NOTE — Patient Instructions (Addendum)
Your Plan:  Continue CPAP Work on good sleep hygiene  Continue Aricept 10 mg at bedtime Consider adding Namenda at the next visit   Thank you for coming to see [place] at [facility]. I hope we have been able to provide you high quality care today.  You may receive a patient satisfaction survey over the next few weeks. We would appreciate your feedback and comments so that we may continue to improve ourselves and the health of our patients.

## 2021-10-30 ENCOUNTER — Encounter: Payer: Self-pay | Admitting: Orthopaedic Surgery

## 2021-10-30 ENCOUNTER — Other Ambulatory Visit: Payer: Self-pay

## 2021-10-30 ENCOUNTER — Ambulatory Visit (INDEPENDENT_AMBULATORY_CARE_PROVIDER_SITE_OTHER): Payer: Medicare Other | Admitting: Physician Assistant

## 2021-10-30 DIAGNOSIS — Z96641 Presence of right artificial hip joint: Secondary | ICD-10-CM

## 2021-10-30 NOTE — Progress Notes (Signed)
Post-Op Visit Note   Patient: Catherine Mcconnell           Date of Birth: 1946/04/17           MRN: [id number] Visit Date: 10/30/2021 PCP: Inda Coke, PA   Assessment & Plan:  Chief Complaint:  Chief Complaint  Patient presents with   Right Hip - Pain   Visit Diagnoses:  1. History of total hip replacement, right     Plan: Patient is a pleasant 76 year old female who comes in today 3 months status post right total hip replacement 08/06/2021.  She has been doing excellent.  She denies any pain to the hip.  She has regained full range of motion and strength.  Examination of the right hip reveals painless logroll.  Full hip flexion.  She is neurovascular intact distally.  At this point, she will continue to advance with activity as tolerated.  Dental prophylaxis reinforced.  Follow-up with [place] in 3 months time for repeat evaluation and AP pelvis x-rays.  Call with concerns or questions.  Follow-Up Instructions: Return in about 3 months (around 01/28/2022).   Orders:  No orders of the defined types were placed in this encounter.  No orders of the defined types were placed in this encounter.   Imaging: No new imaging  PMFS History: Patient Active Problem List   Diagnosis Date Noted   Status post total replacement of right hip 08/06/2021   Preop cardiovascular exam 06/04/2021   Avascular necrosis of bone of right hip ([place]) 05/02/2021   Type 2 diabetes mellitus with hyperglycemia, without long-term current use of insulin ([place]) 09/11/2020   Type 2 diabetes mellitus with diabetic polyneuropathy, without long-term current use of insulin ([place]) 09/11/2020   Type 2 diabetes mellitus with stage 3a chronic kidney disease, without long-term current use of insulin ([place]) 09/11/2020   Type 2 diabetes mellitus with hyperglycemia, with long-term current use of insulin ([place]) 06/06/2020   Memory deficits 03/16/2020   Diabetes mellitus without complication ([place]) 19/50/9326   OSA on CPAP     Chronic kidney disease    Essential hypertension    Mixed hyperlipidemia    Past Medical History:  Diagnosis Date   Anemia    on meds   Arthritis    hips/wrists/back   Cataract    sx-bilateral   Chronic kidney disease    stage 3   Diabetes mellitus without complication ([place])    on meds   Hypertension    on meds   Iron deficiency    on oral iron supplements, has not required transfusion   Mixed hyperlipidemia    on meds   OSA on CPAP    compliant   Sleep apnea    uses CPAP   Thyroid nodule    removed-not on meds at this time (12/01/2020)    Family History  Problem Relation Age of Onset   Stroke Mother    Hypertension Mother    Heart disease Mother    Heart attack Father    Early death Father    Hypertension Father    Diabetes Sister    Early death Sister    Hypertension Sister    Stroke Sister    Diabetes Sister    Early death Sister    Hyperlipidemia Sister    Kidney disease Sister    Diabetes Sister    Hyperlipidemia Sister    Hypertension Sister    Diabetes Sister    Early death Brother    Arthritis  Brother    Heart attack Brother    Heart disease Brother    Arthritis Brother    Memory loss Paternal Uncle    Depression Daughter    Hypertension Daughter    Hyperlipidemia Daughter    Prostate cancer Son    Cancer Son    Early death Son    Colon polyps Neg Hx    Colon cancer Neg Hx    Esophageal cancer Neg Hx    Rectal cancer Neg Hx    Stomach cancer Neg Hx    Sleep apnea Neg Hx     Past Surgical History:  Procedure Laterality Date   ABDOMINAL HYSTERECTOMY  1996   MASS EXCISION Right 2017   right arm mass removal   THYROID SURGERY     left side removal benign nodule   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   Procedure: RIGHT TOTAL HIP ARTHROPLASTY ANTERIOR APPROACH;  Surgeon: Leandrew Koyanagi, MD;  Location: [facility];  Service: Orthopedics;  Laterality: Right;  3-C   Social History   Occupational History   Occupation: Retired   Tobacco Use    Smoking status: Former    Packs/day: 0.25    Years: 1.00    Pack years: 0.25    Types: Cigarettes    Quit date: 1960    Years since quitting: 63.0   Smokeless tobacco: Never   Tobacco comments:    only socially in high school  Vaping Use   Vaping Use: Never used  Substance and Sexual Activity   Alcohol use: Not Currently   Drug use: Never   Sexual activity: Not on file

## 2021-11-03 ENCOUNTER — Other Ambulatory Visit: Payer: Self-pay | Admitting: Cardiology

## 2021-11-03 ENCOUNTER — Other Ambulatory Visit: Payer: Self-pay | Admitting: Adult Health

## 2021-11-16 ENCOUNTER — Encounter: Payer: Self-pay | Admitting: Physician Assistant

## 2021-12-03 LAB — HM DIABETES EYE EXAM

## 2021-12-04 ENCOUNTER — Encounter: Payer: Self-pay | Admitting: Physician Assistant

## 2021-12-14 ENCOUNTER — Other Ambulatory Visit: Payer: Self-pay | Admitting: Physician Assistant

## 2021-12-14 DIAGNOSIS — I10 Essential (primary) hypertension: Secondary | ICD-10-CM

## 2022-01-10 ENCOUNTER — Ambulatory Visit: Payer: Medicare Other | Admitting: Internal Medicine

## 2022-01-10 ENCOUNTER — Encounter: Payer: Self-pay | Admitting: Internal Medicine

## 2022-01-10 VITALS — BP 120/74 | HR 60 | Ht 66.0 in | Wt 152.0 lb

## 2022-01-10 DIAGNOSIS — E1122 Type 2 diabetes mellitus with diabetic chronic kidney disease: Secondary | ICD-10-CM | POA: Diagnosis not present

## 2022-01-10 DIAGNOSIS — E1142 Type 2 diabetes mellitus with diabetic polyneuropathy: Secondary | ICD-10-CM | POA: Diagnosis not present

## 2022-01-10 DIAGNOSIS — N1831 Chronic kidney disease, stage 3a: Secondary | ICD-10-CM | POA: Diagnosis not present

## 2022-01-10 LAB — POCT GLUCOSE (DEVICE FOR HOME USE): Glucose Fasting, POC: 139 mg/dL — AB (ref 70–99)

## 2022-01-10 LAB — POCT GLYCOSYLATED HEMOGLOBIN (HGB A1C): Hemoglobin A1C: 6.7 % — AB (ref 4.0–5.6)

## 2022-01-10 NOTE — Progress Notes (Signed)
?Name: Catherine Mcconnell  ?Age/ Sex: 76 y.o., female   ?MRN/ DOB: [id number], 28-May-1946    ? ?PCP: Inda Coke, PA   ?Reason for Endocrinology Evaluation: Type 2 Diabetes Mellitus  ?Initial Endocrine Consultative Visit: 08/10/2019  ? ? ?PATIENT IDENTIFIER: Catherine Mcconnell is a 76 y.o. female with a past medical history of T2DM and HTN. The patient has followed with Endocrinology clinic since 08/10/2019 for consultative assistance with management of her diabetes. ? ?DIABETIC HISTORY:  ?Catherine Mcconnell was diagnosed with DM at age 70.  Her hemoglobin A1c has ranged from 7.7% in 2021, peaking at 10.6%  in 2021 ? ? ? ?She transitioned care from Dr. Loanne Drilling in  05/2020. She was on Januvia, Rybelsus and had tresiba on her med list but she has stopped it due to swelling. Her A1c was 10.6 % in 04/2020 . We stopped Januvia, increased rybelsus and restarted Tyler Aas  ? ? ?[facility] [place] through Nephrology 10/2021 ? ?SUBJECTIVE:  ? ?During the last visit (07/11/2021): A1c 7.8 % . We continued Rybelsus and continued  basal insulin  ? ?Today (01/10/2022): Catherine Mcconnell is here for a follow up on diabetes management. She is accompanied by her daughter.  She checks her blood sugars 2 times daily. The patient has not had hypoglycemic episodes since the last clinic visit.  ? ?Denies nausea , diarrhea or vomiting  ? ?She was seen by neurology for a follow up on OSA ?She is S/P sx for avascular necrosis of right hip 07/2021 ?She was started on Farxiga through Dr. Bishop [place] office  ? ? ? ?HOME DIABETES REGIMEN:  ?Rybelsus 14 mg daily  With Breakfast  ?Farxiga 10 mg daily  ?Tresiba 10 units daily  ? ? ? ?Statin: yes ?ACE-I/ARB: yes  ? ? ?METER DOWNLOAD SUMMARY:Did not bring  ? ? ? ? ? ?DIABETIC COMPLICATIONS: ?Microvascular complications:  ?Neuropathy, CKD III ?Denies: retinopathy ?Last Eye Exam: Completed 12/03/2021 ? ?Macrovascular complications:  ? ?Denies: CAD, CVA, PVD ? ? ?HISTORY:  ?Past Medical History:  ?Past Medical History:  ?Diagnosis Date   ? Anemia   ? on meds  ? Arthritis   ? hips/wrists/back  ? Cataract   ? sx-bilateral  ? Chronic kidney disease   ? stage 3  ? Diabetes mellitus without complication ([place])   ? on meds  ? Hypertension   ? on meds  ? Iron deficiency   ? on oral iron supplements, has not required transfusion  ? Mixed hyperlipidemia   ? on meds  ? OSA on CPAP   ? compliant  ? Sleep apnea   ? uses CPAP  ? Thyroid nodule   ? removed-not on meds at this time (12/01/2020)  ? ?Past Surgical History:  ?Past Surgical History:  ?Procedure Laterality Date  ? ABDOMINAL HYSTERECTOMY  1996  ? MASS EXCISION Right 2017  ? right arm mass removal  ? THYROID SURGERY    ? left side removal benign nodule  ? 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  ? Procedure: RIGHT TOTAL HIP ARTHROPLASTY ANTERIOR APPROACH;  Surgeon: Leandrew Koyanagi, MD;  Location: [facility];  Service: Orthopedics;  Laterality: Right;  3-C  ? ?Social History:  reports that she quit smoking about 63 years ago. Her smoking use included cigarettes. She has a 0.25 pack-year smoking history. She has never used smokeless tobacco. She reports that she does not currently use alcohol. She reports that she does not use drugs. ?Family History:  ?Family History  ?Problem Relation Age of  Onset  ? Stroke Mother   ? Hypertension Mother   ? Heart disease Mother   ? Heart attack Father   ? Early death Father   ? Hypertension Father   ? Diabetes Sister   ? Early death Sister   ? Hypertension Sister   ? Stroke Sister   ? Diabetes Sister   ? Early death Sister   ? Hyperlipidemia Sister   ? Kidney disease Sister   ? Diabetes Sister   ? Hyperlipidemia Sister   ? Hypertension Sister   ? Diabetes Sister   ? Early death Brother   ? Arthritis Brother   ? Heart attack Brother   ? Heart disease Brother   ? Arthritis Brother   ? Memory loss Paternal Uncle   ? Depression Daughter   ? Hypertension Daughter   ? Hyperlipidemia Daughter   ? Prostate cancer Son   ? Cancer Son   ? Early death Son   ? Colon polyps Neg Hx   ? Colon  cancer Neg Hx   ? Esophageal cancer Neg Hx   ? Rectal cancer Neg Hx   ? Stomach cancer Neg Hx   ? Sleep apnea Neg Hx   ? ? ? ?HOME MEDICATIONS: ?Allergies as of 01/10/2022   ? ?   Reactions  ? Lisinopril Rash  ? Metformin And Related Swelling  ? ?  ? ?  ?Medication List  ?  ? ?  ? Accurate as of January 10, 2022 10:49 AM. If you have any questions, ask your nurse or doctor.  ?  ?  ? ?  ? ?amLODipine 10 MG tablet ?Commonly known as: NORVASC ?TAKE 1 TABLET BY MOUTH  DAILY ?  ?atorvastatin 80 MG tablet ?Commonly known as: LIPITOR ?TAKE 1 TABLET BY MOUTH  DAILY ?  ?carvedilol 6.25 MG tablet ?Commonly known as: COREG ?TAKE 1 TABLET BY MOUTH  TWICE DAILY WITH A MEAL ?  ?docusate sodium 100 MG capsule ?Commonly known as: Colace ?Take 1 capsule (100 mg total) by mouth daily as needed. ?  ?donepezil 10 MG disintegrating tablet ?Commonly known as: ARICEPT ODT ?DISSOLVE 1 TABLET ON THE  TONGUE AT BEDTIME ?  ?Farxiga 10 MG Tabs tablet ?Generic drug: dapagliflozin propanediol ?Take 10 mg by mouth daily. ?  ?ferrous sulfate 325 (65 FE) MG tablet ?Commonly known as: Iron Supplement ?Take 1 tablet (325 mg total) by mouth 2 (two) times daily with a meal. ?  ?gabapentin 100 MG capsule ?Commonly known as: NEURONTIN ?Take 3 capsules by mouth every day ?What changed: when to take this ?  ?losartan 50 MG tablet ?Commonly known as: COZAAR ?TAKE 1 TABLET BY MOUTH  DAILY ?  ?Lumigan 0.01 % Soln ?Generic drug: bimatoprost ?Place 1 drop into both eyes every evening. ?  ?methocarbamol 500 MG tablet ?Commonly known as: Robaxin ?Take 1 tablet (500 mg total) by mouth 2 (two) times daily as needed. To be taken after surgery ?  ?ondansetron 4 MG tablet ?Commonly known as: Zofran ?Take 1 tablet (4 mg total) by mouth every 8 (eight) hours as needed for nausea or vomiting. ?  ?OneTouch Ultra test strip ?Generic drug: glucose blood ?USE TO TEST BLOOD SUGARS  TWICE DAILY ?  ?onetouch ultrasoft lancets ?CHECK TWICE PER DAY BEFORE  BREAKFAST AND DINNER ?   ?oxyCODONE-acetaminophen 5-325 MG tablet ?Commonly known as: Percocet ?Take 1-2 tablets by mouth every 6 (six) hours as needed. To be taken after surgery ?  ?Rybelsus 14 MG Tabs ?Generic drug:  Semaglutide ?Take 1 tablet by mouth daily. ?  ?Tyler Aas FlexTouch 100 UNIT/ML FlexTouch Pen ?Generic drug: insulin degludec ?Inject 10 Units into the skin daily. ?  ?TRUEplus Pen Needles 32G X 4 MM Misc ?Generic drug: Insulin Pen Needle ?1 Device by Other route daily. ?  ? ?  ? ? ? ?OBJECTIVE:  ? ?Vital Signs: BP 120/74 (BP Location: Left Arm, Patient Position: Sitting, Cuff Size: Large)   Pulse 60   Ht '5\' 6"'$  (1.676 m)   Wt 152 lb (68.9 kg)   SpO2 97%   BMI 24.53 kg/m?   ?Wt Readings from Last 3 Encounters:  ?01/10/22 152 lb (68.9 kg)  ?10/24/21 151 lb 3.2 oz (68.6 kg)  ?10/01/21 152 lb 9.6 oz (69.2 kg)  ? ? ? ?Exam: ?General: Pt appears well and is in NAD  ?Lungs: Clear with good BS bilat with no rales, rhonchi, or wheezes  ?Heart: RRR   ?Extremities: No pretibial edema. Marland Kitchen  ?Neuro: MS is good with appropriate affect, pt is alert and Ox3  ? ?   ?DM foot exam: 01/10/2022 ?  ?The skin of the feet is intact without sores or ulcerations.Toe nails are dystrophic  ?The pedal pulses are 2+ on right and 2+ on left. ?The sensation is intact to a screening 5.07, 10 gram monofilament bilaterally ? ? ?DATA REVIEWED: ? ?Lab Results  ?Component Value Date  ? HGBA1C 6.7 (A) 01/10/2022  ? HGBA1C 7.8 (A) 07/11/2021  ? HGBA1C 7.4 (H) 05/02/2021  ? ?Lab Results  ?Component Value Date  ? MICROALBUR 140.4 06/06/2020  ? LDLCALC 136 (H) 06/11/2021  ? CREATININE 1.4 (A) 10/16/2021  ? ?Lab Results  ?Component Value Date  ? MICRALBCREAT 1,543 (H) 06/06/2020  ? ? ? ?Lab Results  ?Component Value Date  ? CHOL 215 (H) 06/11/2021  ? HDL 61 06/11/2021  ? LDLCALC 136 (H) 06/11/2021  ? TRIG 102 06/11/2021  ? CHOLHDL 3.5 06/11/2021  ?     ? ? ?ASSESSMENT / PLAN / RECOMMENDATIONS:  ? ?1) Type 2 Diabetes Mellitus,Optimally controlled, With neuropathic and  CKD III  complications - Most recent A1c of 6.7  %. Goal A1c < 7.5 %.   ?  ?- Praised the pt on improved glycemic control  ?- She has been on Farxiga through nephrology  ?- No hypoglycemia  ?- No changes ? ? ?

## 2022-01-10 NOTE — Patient Instructions (Addendum)
-   Keep Up the Good Work ! ?- Continue  Rybelsus 14 mg daily  With Breakfast  ?- Continue Tresiba 10  units daily  ? ? ?HOW TO TREAT LOW BLOOD SUGARS (Blood sugar LESS THAN 70 MG/DL) ?Please follow the RULE OF 15 for the treatment of hypoglycemia treatment (when your (blood sugars are less than 70 mg/dL)  ? ?STEP 1: Take 15 grams of carbohydrates when your blood sugar is low, which includes:  ?3-4 GLUCOSE TABS  OR ?3-4 OZ OF JUICE OR REGULAR SODA OR ?ONE TUBE OF GLUCOSE GEL   ? ?STEP 2: RECHECK blood sugar in 15 MINUTES ?STEP 3: If your blood sugar is still low at the 15 minute recheck --> then, go back to STEP 1 and treat AGAIN with another 15 grams of carbohydrates. ? ?

## 2022-01-29 ENCOUNTER — Ambulatory Visit: Payer: Medicare Other | Admitting: Orthopaedic Surgery

## 2022-02-06 ENCOUNTER — Ambulatory Visit (INDEPENDENT_AMBULATORY_CARE_PROVIDER_SITE_OTHER): Payer: Medicare Other

## 2022-02-06 ENCOUNTER — Ambulatory Visit: Payer: Medicare Other | Admitting: Orthopaedic Surgery

## 2022-02-06 ENCOUNTER — Encounter: Payer: Self-pay | Admitting: Adult Health

## 2022-02-06 ENCOUNTER — Encounter: Payer: Self-pay | Admitting: Orthopaedic Surgery

## 2022-02-06 DIAGNOSIS — Z96641 Presence of right artificial hip joint: Secondary | ICD-10-CM

## 2022-02-06 DIAGNOSIS — G4733 Obstructive sleep apnea (adult) (pediatric): Secondary | ICD-10-CM

## 2022-02-06 NOTE — Telephone Encounter (Signed)
Pt last seen 10-2021.  I pulled DL to review.  ?

## 2022-02-06 NOTE — Progress Notes (Signed)
? ?Office Visit Note ?  ?Patient: Catherine Mcconnell           ?Date of Birth: 1945/12/13           ?MRN: [id number] ?Visit Date: 02/06/2022 ?             ?Requested by: Inda Coke, [place] ?[place] [address] ?PCP: Inda Coke, PA ? ? ?Assessment & Plan: ?Visit Diagnoses:  ?1. Status post total replacement of right hip   ?2. History of total hip replacement, right   ? ? ?Plan: Catherine Mcconnell here for her 53-monthcheckup status post right total hip replacement on 08/06/2021.  She is doing very well and back to normal activities with occasional discomfort.  No complaints otherwise. ? ?Examination of the right hip shows a fully healed surgical scar.  Excellent functional range of motion of the hip.  Normal gait and ambulation. ? ?WPritiis doing really well from her surgery.  She is very happy.  Dental prophylaxis reinforced.  We will see her back in 6 months for 1 year checkup.  Will need standing AP pelvis x-rays at that time. ? ?Follow-Up Instructions: Return in about 6 months (around 08/08/2022).  ? ?Orders:  ?Orders Placed This Encounter  ?Procedures  ? XR Pelvis 1-2 Views  ? ?No orders of the defined types were placed in this encounter. ? ? ? ? Procedures: ?No procedures performed ? ? ?Clinical Data: ?No additional findings. ? ? ?Subjective: ?Chief Complaint  ?Patient presents with  ? Right Hip - Pain  ? ? ?HPI ? ?Review of Systems ? ? ?Objective: ?Vital Signs: There were no vitals taken for this visit. ? ?Physical Exam ? ?Ortho Exam ? ?Specialty Comments:  ?No specialty comments available. ? ?Imaging: ?XR Pelvis 1-2 Views ? ?Result Date: 02/06/2022 ?Stable total hip replacement without complications  ? ? ?PMFS History: ?Patient Active Problem List  ? Diagnosis Date Noted  ? Status post total replacement of right hip 08/06/2021  ? Preop cardiovascular exam 06/04/2021  ? Avascular necrosis of bone of right hip ([facility] 05/02/2021  ? Type 2 diabetes mellitus with hyperglycemia, without long-term  current use of insulin ([facility] 09/11/2020  ? Type 2 diabetes mellitus with diabetic polyneuropathy, without long-term current use of insulin ([facility] 09/11/2020  ? Type 2 diabetes mellitus with stage 3a chronic kidney disease, without long-term current use of insulin ([facility] 09/11/2020  ? Type 2 diabetes mellitus with hyperglycemia, with long-term current use of insulin ([facility] 06/06/2020  ? Memory deficits 03/16/2020  ? Diabetes mellitus without complication ([facility] 042/70/6237 ? OSA on CPAP   ? Chronic kidney disease   ? Essential hypertension   ? Mixed hyperlipidemia   ? ?Past Medical History:  ?Diagnosis Date  ? Anemia   ? on meds  ? Arthritis   ? hips/wrists/back  ? Cataract   ? sx-bilateral  ? Chronic kidney disease   ? stage 3  ? Diabetes mellitus without complication ([facility]   ? on meds  ? Hypertension   ? on meds  ? Iron deficiency   ? on oral iron supplements, has not required transfusion  ? Mixed hyperlipidemia   ? on meds  ? OSA on CPAP   ? compliant  ? Sleep apnea   ? uses CPAP  ? Thyroid nodule   ? removed-not on meds at this time (12/01/2020)  ?  ?Family History  ?Problem Relation Age of Onset  ? Stroke Mother   ? Hypertension  Mother   ? Heart disease Mother   ? Heart attack Father   ? Early death Father   ? Hypertension Father   ? Diabetes Sister   ? Early death Sister   ? Hypertension Sister   ? Stroke Sister   ? Diabetes Sister   ? Early death Sister   ? Hyperlipidemia Sister   ? Kidney disease Sister   ? Diabetes Sister   ? Hyperlipidemia Sister   ? Hypertension Sister   ? Diabetes Sister   ? Early death Brother   ? Arthritis Brother   ? Heart attack Brother   ? Heart disease Brother   ? Arthritis Brother   ? Memory loss Paternal Uncle   ? Depression Daughter   ? Hypertension Daughter   ? Hyperlipidemia Daughter   ? Prostate cancer Son   ? Cancer Son   ? Early death Son   ? Colon polyps Neg Hx   ? Colon cancer Neg Hx   ? Esophageal cancer Neg Hx   ? Rectal cancer Neg Hx   ? Stomach cancer Neg Hx   ? Sleep  apnea Neg Hx   ?  ?Past Surgical History:  ?Procedure Laterality Date  ? ABDOMINAL HYSTERECTOMY  1996  ? MASS EXCISION Right 2017  ? right arm mass removal  ? THYROID SURGERY    ? left side removal benign nodule  ? 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  ? Procedure: RIGHT TOTAL HIP ARTHROPLASTY ANTERIOR APPROACH;  Surgeon: Leandrew Koyanagi, MD;  Location: [facility];  Service: Orthopedics;  Laterality: Right;  3-C  ? ?Social History  ? ?Occupational History  ? Occupation: Retired   ?Tobacco Use  ? Smoking status: Former  ?  Packs/day: 0.25  ?  Years: 1.00  ?  Pack years: 0.25  ?  Types: Cigarettes  ?  Quit date: 93  ?  Years since quitting: 63.3  ? Smokeless tobacco: Never  ? Tobacco comments:  ?  only socially in high school  ?Vaping Use  ? Vaping Use: Never used  ?Substance and Sexual Activity  ? Alcohol use: Not Currently  ? Drug use: Never  ? Sexual activity: Not on file  ? ? ? ? ? ? ?

## 2022-02-06 NOTE — Telephone Encounter (Signed)
Download looks good okay to switch to a full facemask ?

## 2022-02-07 ENCOUNTER — Other Ambulatory Visit: Payer: Self-pay | Admitting: Physician Assistant

## 2022-02-14 ENCOUNTER — Telehealth: Payer: Self-pay | Admitting: Orthopaedic Surgery

## 2022-02-14 NOTE — Telephone Encounter (Signed)
Please call the pt --in extreme pain --having to use walker  ?

## 2022-02-14 NOTE — Telephone Encounter (Signed)
Please advise on what to do

## 2022-02-14 NOTE — Telephone Encounter (Signed)
Sent mychart msg.

## 2022-02-14 NOTE — Telephone Encounter (Signed)
Extreme pain where?

## 2022-02-15 NOTE — Telephone Encounter (Signed)
Hard to say what's going on.  She can see [place] next week or go to urgent care if she can't wait.

## 2022-02-16 ENCOUNTER — Emergency Department (HOSPITAL_BASED_OUTPATIENT_CLINIC_OR_DEPARTMENT_OTHER)
Admission: EM | Admit: 2022-02-16 | Discharge: 2022-02-16 | Disposition: A | Discharge status: Home / Self Care | Payer: Medicare Other | Attending: Emergency Medicine | Admitting: Emergency Medicine

## 2022-02-16 ENCOUNTER — Emergency Department (HOSPITAL_BASED_OUTPATIENT_CLINIC_OR_DEPARTMENT_OTHER): Payer: Medicare Other

## 2022-02-16 ENCOUNTER — Other Ambulatory Visit: Payer: Self-pay

## 2022-02-16 DIAGNOSIS — M79605 Pain in left leg: Secondary | ICD-10-CM | POA: Insufficient documentation

## 2022-02-16 DIAGNOSIS — M79604 Pain in right leg: Secondary | ICD-10-CM

## 2022-02-16 DIAGNOSIS — Z7984 Long term (current) use of oral hypoglycemic drugs: Secondary | ICD-10-CM | POA: Insufficient documentation

## 2022-02-16 DIAGNOSIS — I129 Hypertensive chronic kidney disease with stage 1 through stage 4 chronic kidney disease, or unspecified chronic kidney disease: Secondary | ICD-10-CM | POA: Diagnosis not present

## 2022-02-16 DIAGNOSIS — N183 Chronic kidney disease, stage 3 unspecified: Secondary | ICD-10-CM | POA: Diagnosis not present

## 2022-02-16 DIAGNOSIS — E1122 Type 2 diabetes mellitus with diabetic chronic kidney disease: Secondary | ICD-10-CM | POA: Diagnosis not present

## 2022-02-16 DIAGNOSIS — Z79899 Other long term (current) drug therapy: Secondary | ICD-10-CM | POA: Insufficient documentation

## 2022-02-16 DIAGNOSIS — M62838 Other muscle spasm: Secondary | ICD-10-CM

## 2022-02-16 DIAGNOSIS — M25551 Pain in right hip: Secondary | ICD-10-CM | POA: Diagnosis present

## 2022-02-16 NOTE — ED Triage Notes (Signed)
Pt from home c/o right hip pain radiating to lower leg x 4 days with tingling to toes. States "you can see the muscle cramping on the leg". Last seen orthopedic doctor on 02/06/2022, Xray done then and "looks good". H/O Right hip replacement. Family is worried of blood clot due to calf pain.  ?

## 2022-02-16 NOTE — Discharge Instructions (Signed)
You are seen in the emergency room today with right leg pain.  The ultrasound and x-ray of your leg were normal.  Please consider taking your Robaxin as needed for spasming pain.  You may also try topical Voltaren gel which is available over-the-counter.  Please follow with your primary care doctor in the coming week and return with any new or suddenly worsening symptoms. ?

## 2022-02-16 NOTE — ED Provider Notes (Signed)
? ?Emergency Department Provider Note ? ? ?I have reviewed the triage vital signs and the nursing notes. ? ? ?HISTORY ? ?Chief Complaint ?Hip Pain ? ? ?HPI ?Catherine Mcconnell is a 76 y.o. female presents to the ED with right hip and leg pain. Pain in the hip and calf. Patient with hip replacement last year but no immediate complications. Symptoms worsening over the last 4 days. Family noting cramping pain in the calf. No CP or SOB. No fever. No injury.   ? ?Past Medical History:  ?Diagnosis Date  ? Anemia   ? on meds  ? Arthritis   ? hips/wrists/back  ? Cataract   ? sx-bilateral  ? Chronic kidney disease   ? stage 3  ? Diabetes mellitus without complication ([place])   ? on meds  ? Hypertension   ? on meds  ? Iron deficiency   ? on oral iron supplements, has not required transfusion  ? Mixed hyperlipidemia   ? on meds  ? OSA on CPAP   ? compliant  ? Sleep apnea   ? uses CPAP  ? Thyroid nodule   ? removed-not on meds at this time (12/01/2020)  ? ? ?Review of Systems ? ?Constitutional: No fever/chills ?Cardiovascular: Denies chest pain. ?Respiratory: Denies shortness of breath. ?Gastrointestinal: No abdominal pain.  No nausea, no vomiting.  No diarrhea.  No constipation. ?Genitourinary: Negative for dysuria. ?Musculoskeletal: Positive right leg pain.  ?Skin: Negative for rash. ?Neurological: Negative for headaches. ? ?____________________________________________ ? ? ?PHYSICAL EXAM: ? ?VITAL SIGNS: ?ED Triage Vitals  ?Enc Vitals Group  ?   BP 02/16/22 1128 (!) 168/68  ?   Pulse Rate 02/16/22 1128 61  ?   Resp 02/16/22 1128 17  ?   Temp 02/16/22 1128 98.5 ?F (36.9 ?C)  ?   Temp Source 02/16/22 1128 Oral  ?   SpO2 02/16/22 1128 99 %  ?   Weight 02/16/22 1126 150 lb (68 kg)  ?   Height 02/16/22 1126 '5\' 6"'$  (1.676 m)  ? ?Constitutional: Alert and oriented. Well appearing and in no acute distress. ?Eyes: Conjunctivae are normal.  ?Head: Atraumatic. ?Nose: No congestion/rhinnorhea. ?Mouth/Throat: Mucous membranes are moist.  ?Neck:  No stridor.   ?Cardiovascular: Normal rate, regular rhythm. Good peripheral circulation. Grossly normal heart sounds.   ?Respiratory: Normal respiratory effort.  No retractions. Lungs CTAB. ?Gastrointestinal: Soft and nontender. No distention.  ?Musculoskeletal: No lower extremity tenderness nor edema. No gross deformities of extremities. Normal ROM of the right hip, knee, and ankle. No joint effusion. No leg asymmetry.  ?Neurologic:  Normal speech and language. No gross focal neurologic deficits are appreciated.  ?Skin:  Skin is warm, dry and intact. No rash noted. ? ? ?____________________________________________ ? ? ?PROCEDURES ? ?Procedure(s) performed:  ? ?Procedures ? ?None ?____________________________________________ ? ? ?INITIAL IMPRESSION / ASSESSMENT AND PLAN / ED COURSE ? ?Pertinent labs & imaging results that were available during my care of the patient were reviewed by me and considered in my medical decision making (see chart for details). ?  ?This patient is Presenting for Evaluation of leg pain, which does require a range of treatment options, and is a complaint that involves a high risk of morbidity and mortality. ? ?The Differential Diagnoses include fracture, dislocation, joint infection, MSK pull, DVT. ? ? ?I did obtain Additional Historical Information from daughter. ? ?Radiologic Tests Ordered, included DVT US and hip x-ray. I independently interpreted the images and agree with radiology interpretation.  ? ?Medical Decision Making:  Summary:  ?Patient with right leg pain/cramping. DVT US negative for DVT. Plan for Robaxin, which the patient has at home, along with voltaren. No bony abnormality. Plan for continued PCP and Ortho follow up should symptoms continue.  ? ?Disposition: discharge ? ?____________________________________________ ? ?FINAL CLINICAL IMPRESSION(S) / ED DIAGNOSES ? ?Final diagnoses:  ?Right leg pain  ?Muscle spasm  ? ? ?Note:  This document was prepared using Dragon voice  recognition software and may include unintentional dictation errors. ? ?Nanda Quinton, MD, FACEP ?Emergency Medicine ? ?  ?Margette Fast, MD ?02/19/22 0831 ? ?

## 2022-02-18 ENCOUNTER — Ambulatory Visit: Payer: Medicare Other | Admitting: Physician Assistant

## 2022-02-18 ENCOUNTER — Encounter: Payer: Self-pay | Admitting: Physician Assistant

## 2022-02-18 ENCOUNTER — Telehealth: Payer: Self-pay | Admitting: *Deleted

## 2022-02-18 VITALS — BP 128/80 | HR 74 | Temp 97.6°F | Ht 66.0 in | Wt 147.0 lb

## 2022-02-18 DIAGNOSIS — M25551 Pain in right hip: Secondary | ICD-10-CM

## 2022-02-18 MED ORDER — METHYLPREDNISOLONE ACETATE 40 MG/ML IJ SUSP
40.0000 mg | Freq: Once | INTRAMUSCULAR | Status: AC
  Administered 2022-02-18: 40 mg via INTRAMUSCULAR

## 2022-02-18 NOTE — Patient Instructions (Signed)
It was great to see you! ? ?We will call [facility] after lunch ? ?We gave you 40 mg depomedrol injection today ? ?May also take your pain medication today when you get home ? ?If worsening symptoms, please go to the ER. ? ?Take care, ? ?Inda Coke PA-C  ?

## 2022-02-18 NOTE — Telephone Encounter (Signed)
Spoke to pt's daughter Rockwell [place], told her scheduled an appt with Dr. Erlinda Hong for Friday 02/22/2022 at [facility] verbalized understanding. ?

## 2022-02-18 NOTE — Progress Notes (Signed)
Catherine Mcconnell is a 76 y.o. female here for a follow up of a pre-existing problem. ? ?History of Present Illness:  ? ?Chief Complaint  ?Patient presents with  ? f/u ED visit  ?  Pt c/o hip pain off and on since 5/3. Pain no better since seen in the ED on 5/6.  ? ? ?HPI ? ?Right Hip Pain ?Patient went to ED on 02/16/2022 with right hip. She states right hip pain radiates down to her lower leg with tingling to toes. Also had muscle cramping on the leg. She had concern for blood clot so she went to the ER with her daughter, who is with her today. Patient had work-up in the ED which was reassuring. Ultrasound and hip xray was normal. She was prescribed over the counter Voltaren gel and as well as Robaxin for muscle cramps. Patient was discharged to follow up with PCP.  ? ?Today, she states her hip pain has not improved since seen in the ED. She notes pain started suddenly Thursday morning and started to get worse. Has had some swelling as well. She has not been taking her medication since she felt like pain was improving. However, symptoms seems to be worsening since getting up and moving and coming to the office. She is requesting steroid shots for her pain. No injury or trauma. She does have a hx of hip arthroplasty, recently with Dr. Erlinda Hong.  ? ? ?Past Medical History:  ?Diagnosis Date  ? Anemia   ? on meds  ? Arthritis   ? hips/wrists/back  ? Cataract   ? sx-bilateral  ? Chronic kidney disease   ? stage 3  ? Diabetes mellitus without complication ([place])   ? on meds  ? Hypertension   ? on meds  ? Iron deficiency   ? on oral iron supplements, has not required transfusion  ? Mixed hyperlipidemia   ? on meds  ? OSA on CPAP   ? compliant  ? Sleep apnea   ? uses CPAP  ? Thyroid nodule   ? removed-not on meds at this time (12/01/2020)  ? ?  ?Social History  ? ?Tobacco Use  ? Smoking status: Former  ?  Packs/day: 0.25  ?  Years: 1.00  ?  Pack years: 0.25  ?  Types: Cigarettes  ?  Quit date: 41  ?  Years since quitting: 63.3  ?  Smokeless tobacco: Never  ? Tobacco comments:  ?  only socially in high school  ?Vaping Use  ? Vaping Use: Never used  ?Substance Use Topics  ? Alcohol use: Not Currently  ? Drug use: Never  ? ? ?Past Surgical History:  ?Procedure Laterality Date  ? ABDOMINAL HYSTERECTOMY  1996  ? MASS EXCISION Right 2017  ? right arm mass removal  ? THYROID SURGERY    ? left side removal benign nodule  ? 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  ? Procedure: RIGHT TOTAL HIP ARTHROPLASTY ANTERIOR APPROACH;  Surgeon: Leandrew Koyanagi, MD;  Location: [facility];  Service: Orthopedics;  Laterality: Right;  3-C  ? ? ?Family History  ?Problem Relation Age of Onset  ? Stroke Mother   ? Hypertension Mother   ? Heart disease Mother   ? Heart attack Father   ? Early death Father   ? Hypertension Father   ? Diabetes Sister   ? Early death Sister   ? Hypertension Sister   ? Stroke Sister   ? Diabetes Sister   ? Early death  Sister   ? Hyperlipidemia Sister   ? Kidney disease Sister   ? Diabetes Sister   ? Hyperlipidemia Sister   ? Hypertension Sister   ? Diabetes Sister   ? Early death Brother   ? Arthritis Brother   ? Heart attack Brother   ? Heart disease Brother   ? Arthritis Brother   ? Memory loss Paternal Uncle   ? Depression Daughter   ? Hypertension Daughter   ? Hyperlipidemia Daughter   ? Prostate cancer Son   ? Cancer Son   ? Early death Son   ? Colon polyps Neg Hx   ? Colon cancer Neg Hx   ? Esophageal cancer Neg Hx   ? Rectal cancer Neg Hx   ? Stomach cancer Neg Hx   ? Sleep apnea Neg Hx   ? ? ?Allergies  ?Allergen Reactions  ? Lisinopril Rash  ? Metformin And Related Swelling  ? ? ?Current Medications:  ? ?Current Outpatient Medications:  ?  amLODipine (NORVASC) 10 MG tablet, TAKE 1 TABLET BY MOUTH  DAILY, Disp: 90 tablet, Rfl: 3 ?  atorvastatin (LIPITOR) 80 MG tablet, TAKE 1 TABLET BY MOUTH  DAILY, Disp: 90 tablet, Rfl: 3 ?  carvedilol (COREG) 6.25 MG tablet, TAKE 1 TABLET BY MOUTH  TWICE DAILY WITH A MEAL, Disp: 180 tablet, Rfl: 3 ?   donepezil (ARICEPT ODT) 10 MG disintegrating tablet, DISSOLVE 1 TABLET ON THE  TONGUE AT BEDTIME, Disp: 90 tablet, Rfl: 3 ?  FARXIGA 10 MG TABS tablet, Take 10 mg by mouth daily., Disp: , Rfl:  ?  ferrous sulfate (IRON SUPPLEMENT) 325 (65 FE) MG tablet, Take 1 tablet (325 mg total) by mouth 2 (two) times daily with a meal., Disp: 60 tablet, Rfl: 2 ?  insulin degludec (TRESIBA FLEXTOUCH) 100 UNIT/ML FlexTouch Pen, Inject 10 Units into the skin daily., Disp: 15 mL, Rfl: 3 ?  Insulin Pen Needle (TRUEPLUS PEN NEEDLES) 32G X 4 MM MISC, 1 Device by Other route daily., Disp: 100 each, Rfl: 3 ?  Lancets (ONETOUCH ULTRASOFT) lancets, CHECK TWICE PER DAY BEFORE  BREAKFAST AND DINNER, Disp: 200 each, Rfl: 4 ?  losartan (COZAAR) 50 MG tablet, TAKE 1 TABLET BY MOUTH  DAILY, Disp: 90 tablet, Rfl: 3 ?  LUMIGAN 0.01 % SOLN, Place 1 drop into both eyes every evening., Disp: , Rfl:  ?  methocarbamol (ROBAXIN) 500 MG tablet, Take 1 tablet (500 mg total) by mouth 2 (two) times daily as needed. To be taken after surgery, Disp: 20 tablet, Rfl: 2 ?  ONETOUCH ULTRA test strip, USE TO TEST BLOOD SUGARS  TWICE DAILY, Disp: 200 strip, Rfl: 4 ?  oxyCODONE-acetaminophen (PERCOCET) 5-325 MG tablet, Take 1-2 tablets by mouth every 6 (six) hours as needed. To be taken after surgery, Disp: 40 tablet, Rfl: 0 ?  Semaglutide (RYBELSUS) 14 MG TABS, Take 1 tablet by mouth daily., Disp: 90 tablet, Rfl: 3  ? ?Review of Systems:  ? ?ROS ?Negative unless otherwise specified per HPI.  ? ?Vitals:  ? ?Vitals:  ? 02/18/22 1131  ?BP: 128/80  ?Pulse: 74  ?Temp: 97.6 ?F (36.4 ?C)  ?TempSrc: Temporal  ?SpO2: 97%  ?Weight: 147 lb (66.7 kg)  ?Height: '5\' 6"'$  (1.676 m)  ?   ?Body mass index is 23.73 kg/m?. ? ?Physical Exam:  ? ?Physical Exam ?Vitals and nursing note reviewed.  ?Constitutional:   ?   General: She is not in acute distress. ?   Appearance: She is well-developed. She  is not ill-appearing or toxic-appearing.  ?Cardiovascular:  ?   Rate and Rhythm: Normal  rate and regular rhythm.  ?   Pulses: Normal pulses.  ?   Heart sounds: Normal heart sounds, S1 normal and S2 normal.  ?Pulmonary:  ?   Effort: Pulmonary effort is normal.  ?   Breath sounds: Normal breath sounds.  ?Musculoskeletal:  ?   Comments: Tenderness to R lateral hip with slight swelling present ?Decreased ROM due to pain  ?Skin: ?   General: Skin is warm and dry.  ?Neurological:  ?   Mental Status: She is alert.  ?   GCS: GCS eye subscore is 4. GCS verbal subscore is 5. GCS motor subscore is 6.  ?Psychiatric:     ?   Speech: Speech normal.     ?   Behavior: Behavior normal. Behavior is cooperative.  ? ? ?Assessment and Plan:  ? ?Right hip pain ?Unclear etiology ?Due to recent post-surgical status, recommend close eval and follow-up with her surgeon ?Patient is requesting pain control -- due to CKD, we did provide 40 mg depo-medrol injection ?We attempted to make an appt on her behalf with Dr. Phoebe Sharps office, recommend that she also trial this if we were unsuccessful ?If worsening, needs ER ? ?I,Savera Zaman,acting as a Education administrator for [employer], PA.,have documented all relevant documentation on the behalf of Inda Coke, PA,as directed by  Inda Coke, PA while in the presence of Inda Coke, [place].  ? ?IInda Coke, PA, have reviewed all documentation for this visit. The documentation on 02/18/22 for the exam, diagnosis, procedures, and orders are all accurate and complete. ? ? ?Inda Coke, PA-C ? ?

## 2022-02-20 LAB — COMPREHENSIVE METABOLIC PANEL
Albumin: 4.6 (ref 3.5–5.0)
Calcium: 9.7 (ref 8.7–10.7)
eGFR: 30

## 2022-02-20 LAB — HEPATIC FUNCTION PANEL
ALT: 20 U/L (ref 7–35)
AST: 25 (ref 13–35)
Alkaline Phosphatase: 56 (ref 25–125)
Bilirubin, Total: 0.5

## 2022-02-20 LAB — BASIC METABOLIC PANEL
BUN: 33 — AB (ref 4–21)
CO2: 27 — AB (ref 13–22)
Chloride: 100 (ref 99–108)
Creatinine: 1.8 — AB (ref 0.5–1.1)
Glucose: 206
Potassium: 3.9 mEq/L (ref 3.5–5.1)
Sodium: 138 (ref 137–147)

## 2022-02-20 LAB — CBC AND DIFFERENTIAL
HCT: 39 (ref 36–46)
Hemoglobin: 13 (ref 12.0–16.0)
Neutrophils Absolute: 2.9
Platelets: 222 10*3/uL (ref 150–400)
WBC: 4.2

## 2022-02-20 LAB — CBC: RBC: 4.29 (ref 3.87–5.11)

## 2022-02-22 ENCOUNTER — Ambulatory Visit: Payer: Medicare Other | Admitting: Orthopaedic Surgery

## 2022-02-22 ENCOUNTER — Ambulatory Visit (INDEPENDENT_AMBULATORY_CARE_PROVIDER_SITE_OTHER): Payer: Medicare Other

## 2022-02-22 DIAGNOSIS — M25551 Pain in right hip: Secondary | ICD-10-CM | POA: Diagnosis not present

## 2022-02-22 MED ORDER — PREDNISONE 10 MG (21) PO TBPK: ORAL_TABLET | ORAL | 3 refills | Status: DC

## 2022-02-22 NOTE — Progress Notes (Signed)
? ?Office Visit Note ?  ?Patient: Catherine Mcconnell           ?Date of Birth: [birth year]           ?MRN: [id number] ?Visit Date: 02/22/2022 ?             ?Requested by: Inda Coke, [place] ?[place],  [place] [address] ?PCP: Inda Coke, PA ? ? ?Assessment & Plan: ?Visit Diagnoses:  ?1. Pain in right hip   ? ? ?Plan: Impression is right hip inflammation from likely overuse.  At this point, I have low suspicion for infection.  I would like to start her on a steroid taper.  Dental prophylaxis reinforced.  Follow-up with [place] at her regularly scheduled appointment following her total hip arthroplasty.  Call with concerns or questions in the meantime. ? ?Follow-Up Instructions: No follow-ups on file.  ? ?Orders:  ?Orders Placed This Encounter  ?Procedures  ? XR Lumbar Spine 2-3 Views  ? ?No orders of the defined types were placed in this encounter. ? ? ? ? Procedures: ?No procedures performed ? ? ?Clinical Data: ?No additional findings. ? ? ?Subjective: ?Chief Complaint  ?Patient presents with  ? Right Hip - Pain  ? ? ?HPI patient is a pleasant 76 year old female who comes in today with right hip pain.  She is nearly 7 months status post right total hip replacement and is doing well until about a week ago.  She denies any injury but does note she was at the dentist earlier that day for consultation for dentures.  Her pain worsened over the weekend and was seen at [facility] on 02/16/2022 where x-rays of the right hip were obtained.  These were negative for acute findings.  She comes in today for further evaluation.  The pain is primarily to the right lateral hip just posterior to the incision.  She describes this as a constant ache worse with lumbar flexion as well as when she is putting on her shoes and externally rotating her hip.  She notes cramping to the right lower extremity.  She does note tingling to the right foot.  She denies any fevers or chills or any other systemic symptoms.  Previous venous  Doppler ultrasound was obtained over the weekend which was negative for DVT.  She denies any bowel or bladder change or saddle paresthesias. ? ?Review of Systems as detailed in HPI.  All other reviewed and are negative. ? ? ?Objective: ?Vital Signs: There were no vitals taken for this visit. ? ?Physical Exam well-developed well-nourished female no acute distress.  Alert and oriented x3. ? ?Ortho Exam right hip exam reveals no tenderness to the area of pain just posterior to the total hip incision.  No skin changes.  No warmth or erythema.  No pain to the groin or anterior thigh with logroll or FADIR.  No pain with straight leg raise.  She does have increased pain with lumbar flexion.  No focal weakness.  She is neurovascular intact distally. ? ?Specialty Comments:  ?No specialty comments available. ? ?Imaging: ?XR Lumbar Spine 2-3 Views ? ?Result Date: 02/22/2022 ?X-rays demonstrate advanced multilevel degenerative changes worse at L5-S1  ? ? ?PMFS History: ?Patient Active Problem List  ? Diagnosis Date Noted  ? Status post total replacement of right hip 08/06/2021  ? Preop cardiovascular exam 06/04/2021  ? Avascular necrosis of bone of right hip ([place]) 05/02/2021  ? Type 2 diabetes mellitus with hyperglycemia, without long-term current use of insulin (  [place]) 09/11/2020  ? Type 2 diabetes mellitus with diabetic polyneuropathy, without long-term current use of insulin ([place]) 09/11/2020  ? Type 2 diabetes mellitus with stage 3a chronic kidney disease, without long-term current use of insulin ([place]) 09/11/2020  ? Type 2 diabetes mellitus with hyperglycemia, with long-term current use of insulin ([place]) 06/06/2020  ? Memory deficits 03/16/2020  ? Diabetes mellitus without complication ([place]) 71/21/9758  ? OSA on CPAP   ? Chronic kidney disease   ? Essential hypertension   ? Mixed hyperlipidemia   ? ?Past Medical History:  ?Diagnosis Date  ? Anemia   ? on meds  ? Arthritis   ? hips/wrists/back  ? Cataract   ? sx-bilateral  ?  Chronic kidney disease   ? stage 3  ? Diabetes mellitus without complication ([place])   ? on meds  ? Hypertension   ? on meds  ? Iron deficiency   ? on oral iron supplements, has not required transfusion  ? Mixed hyperlipidemia   ? on meds  ? OSA on CPAP   ? compliant  ? Sleep apnea   ? uses CPAP  ? Thyroid nodule   ? removed-not on meds at this time (12/01/2020)  ?  ?Family History  ?Problem Relation Age of Onset  ? Stroke Mother   ? Hypertension Mother   ? Heart disease Mother   ? Heart attack Father   ? Early death Father   ? Hypertension Father   ? Diabetes Sister   ? Early death Sister   ? Hypertension Sister   ? Stroke Sister   ? Diabetes Sister   ? Early death Sister   ? Hyperlipidemia Sister   ? Kidney disease Sister   ? Diabetes Sister   ? Hyperlipidemia Sister   ? Hypertension Sister   ? Diabetes Sister   ? Early death Brother   ? Arthritis Brother   ? Heart attack Brother   ? Heart disease Brother   ? Arthritis Brother   ? Memory loss Paternal Uncle   ? Depression Daughter   ? Hypertension Daughter   ? Hyperlipidemia Daughter   ? Prostate cancer Son   ? Cancer Son   ? Early death Son   ? Colon polyps Neg Hx   ? Colon cancer Neg Hx   ? Esophageal cancer Neg Hx   ? Rectal cancer Neg Hx   ? Stomach cancer Neg Hx   ? Sleep apnea Neg Hx   ?  ?Past Surgical History:  ?Procedure Laterality Date  ? ABDOMINAL HYSTERECTOMY  1996  ? MASS EXCISION Right 2017  ? right arm mass removal  ? THYROID SURGERY    ? left side removal benign nodule  ? 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  ? Procedure: RIGHT TOTAL HIP ARTHROPLASTY ANTERIOR APPROACH;  Surgeon: Leandrew Koyanagi, MD;  Location: [facility];  Service: Orthopedics;  Laterality: Right;  3-C  ? ?Social History  ? ?Occupational History  ? Occupation: Retired   ?Tobacco Use  ? Smoking status: Former  ?  Packs/day: 0.25  ?  Years: 1.00  ?  Pack years: 0.25  ?  Types: Cigarettes  ?  Quit date: 85  ?  Years since quitting: 63.4  ? Smokeless tobacco: Never  ? Tobacco comments:  ?   only socially in high school  ?Vaping Use  ? Vaping Use: Never used  ?Substance and Sexual Activity  ? Alcohol use: Not Currently  ? Drug use: Never  ? Sexual activity:  Not on file  ? ? ? ? ? ? ?

## 2022-02-27 ENCOUNTER — Encounter: Payer: Self-pay | Admitting: Adult Health

## 2022-02-27 ENCOUNTER — Ambulatory Visit: Payer: Medicare Other | Admitting: Adult Health

## 2022-02-27 VITALS — BP 137/66 | HR 59 | Ht 66.0 in | Wt 148.4 lb

## 2022-02-27 DIAGNOSIS — Z9989 Dependence on other enabling machines and devices: Secondary | ICD-10-CM

## 2022-02-27 DIAGNOSIS — G4733 Obstructive sleep apnea (adult) (pediatric): Secondary | ICD-10-CM | POA: Diagnosis not present

## 2022-02-27 DIAGNOSIS — R413 Other amnesia: Secondary | ICD-10-CM

## 2022-02-27 NOTE — Progress Notes (Signed)
? ? ?PATIENT: Catherine Mcconnell ?DOB: [birth year] ? ?REASON FOR VISIT: follow up ?HISTORY FROM: patient ?Primary neurologist: Dr. Brett Fairy ? ?Chief Complaint  ?Patient presents with  ? Follow-up  ?  Pt in 9 with daughter  pt is here  CPAP follow  Daughter states pt was supposed to get a new full mask but never received it   ? ? ? ?HISTORY OF PRESENT ILLNESS: ?Today 02/27/22: ? ?Catherine Mcconnell is a 76 year old female with a history of obstructive sleep apnea on CPAP and memory disturbance.  She returns today for follow-up. ? ?OSA On CPAP: DL is below. Wants to try full facemask. Otherwise no compliants.  ? ?Memory: Better, currently on Aricept. Reports that she is sleeping better and memory has been better. Lives with daughter- going to [place] to spend the summer with family. Able to complete ADLS independently. Manages own medications, appts and finances.  ? ? ? ?10/24/21: Catherine Mcconnell is a 76 year old female with a history of obstructive sleep apnea on CPAP and memory disturbance.  She returns today for follow-up.  She is with her daughter.  Patient states that she does not sleep all night long.  States that she typically wakes up and stays up for several hours.  If she goes back to sleep during the day she does not use a CPAP.  Patient had hip replacement in October and daughter states that her schedule has been altered since then.  Patient feels that her memory has remained relatively stable.  She lives with her daughter.  Daughter helps her with her medications.  She reports that she manages her own finances.  She continues on Aricept 10 mg at bedtime. ? ? ? ? ? ?04/12/21: Catherine Mcconnell is a 76 year old female with a history of obstructive sleep apnea on CPAP and memory disturbance.  She returns today for follow-up.  She reports that the CPAP works well.  She denies any new issues. ? ?She feels that her memory has remained stable.  She is able to complete all ADLs independently.  She lives with her daughter.  She manages her own  medications and appointments.  She is able to operate a motor vehicle.  She currently takes Aricept 5 mg at bedtime. ? ? ? ?01/02/21: Catherine Mcconnell  is a 76 year old female with a history of obstructive sleep apnea and memory disturbance.  She returns today for follow-up.  She reports that her memory has been relatively stable.  She does not feel that it is gotten any worse.  She lives with her daughter.  Her daughter was unable to attend this visit.  She states that on occasion she feels brain fog but this is inconsistent versus consistent.  She is able to complete all ADLs independently.  She does operate a Teacher, music.  Denies any trouble driving but she reports that she does use a GPS consistently to be safe.  She denies any troubles managing her finances.  Reports that she continues to cook meals without difficulty.  She is currently on no medication for her memory.  She continues to use the CPAP consistently. ? ?HISTORY (Copied from Dr.Dohmeier's note) New problem for this established sleep patient, last seen by NP- Televisit. ? Catherine Mcconnell is a 76 year- old african -[place] . female is seen on 03-16-2020 with her daughter. ?According to her daughter Catherine Mcconnell has forgotten to pay her phone bill which has never happened before, she has gotten lost driving in town on familiar roads.  There  were no aggravating factors such as bad weather or nighttime driving.  These events have left Catherine Mcconnell a little bit shaken. ?Based on these concerns her physician assistant [name] has sent the patient for follow-up.   ?  ?The memory loss is accompanied by a rather poorly controlled diabetes still the HbA1c was 8.4 in April of this year, she has a past medical history of chronic kidney disease which is not graded, arthritis, heart iron deficiency, hypertension, mixed hyperlipidemia and she has a history of a thyroid nodule which was surgically removed. She has a past surgical history of has to hysterectomy in 1996  thyroid surgery a biopsy revealed a benign nodule.  And she had a skin mass removed from her right upper arm in 2017 which  was not cancerous. ?  ? She is followed here in our sleep clinic for OSA obstructive sleep apnea on CPAP.  Just to stay for another visit she has been 100% compliant by days, 97% compliant by time average use at time of 6 hours 51 minutes, CPAP is set to 11 cm water pressure with 3 cm EPR her residual apnea-hypopnea index is 2.4/h very good she does have some central apneas arising but overall the main problem may be an air leak but not a lack of control of apnea.  95th percentile air leak was 55 L/min which means that the mask gets dislodged sometimes. ?  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 ?  ?  ? ?REVIEW OF SYSTEMS: Out of a complete 14 system review of symptoms, the patient complains only of the following symptoms, and all other reviewed systems are negative. ? ?ESS 16 ? ? ?ALLERGIES: ?Allergies  ?Allergen Reactions  ? Lisinopril Rash  ? Metformin And Related Swelling  ? ? ?HOME MEDICATIONS: ?Outpatient Medications Prior to Visit  ?Medication Sig Dispense Refill  ? amLODipine (NORVASC) 10 MG tablet TAKE 1 TABLET BY MOUTH  DAILY 90 tablet 3  ? atorvastatin (LIPITOR) 80 MG tablet TAKE 1 TABLET BY MOUTH  DAILY 90 tablet 3  ? carvedilol (COREG) 6.25 MG tablet TAKE 1 TABLET BY MOUTH  TWICE DAILY WITH A MEAL 180 tablet 3  ? donepezil (ARICEPT ODT) 10 MG disintegrating tablet DISSOLVE 1 TABLET ON THE  TONGUE AT BEDTIME 90 tablet 3  ? FARXIGA 10 MG TABS tablet Take 10 mg by mouth daily.    ? ferrous sulfate (IRON SUPPLEMENT) 325 (65 FE) MG tablet Take 1 tablet (325 mg total) by mouth 2 (two) times daily with a meal. 60 tablet 2  ? insulin degludec (TRESIBA FLEXTOUCH) 100 UNIT/ML FlexTouch Pen Inject 10 Units into the  skin daily. 15 mL 3  ? Insulin Pen Needle (TRUEPLUS PEN NEEDLES) 32G X 4 MM MISC 1 Device by Other route daily. 100 each 3  ? Lancets (ONETOUCH ULTRASOFT) lancets CHECK TWICE PER DAY BEFORE  BREAKFAST AND DINNER 200 each 4  ? losartan (COZAAR) 50 MG tablet TAKE 1 TABLET BY MOUTH  DAILY 90 tablet 3  ? LUMIGAN 0.01 % SOLN Place 1 drop into both eyes every evening.    ? methocarbamol (ROBAXIN) 500 MG tablet Take 1 tablet (500 mg total) by mouth 2 (two) times daily as needed. To be taken after surgery 20 tablet 2  ? ONETOUCH ULTRA test strip USE TO  TEST BLOOD SUGARS  TWICE DAILY 200 strip 4  ? oxyCODONE-acetaminophen (PERCOCET) 5-325 MG tablet Take 1-2 tablets by mouth every 6 (six) hours as needed. To be taken after surgery 40 tablet 0  ? predniSONE (STERAPRED UNI-PAK 21 TAB) 10 MG (21) TBPK tablet Take as directed 21 tablet 3  ? Semaglutide (RYBELSUS) 14 MG TABS Take 1 tablet by mouth daily. 90 tablet 3  ? ?No facility-administered medications prior to visit.  ? ? ?PAST MEDICAL HISTORY: ?Past Medical History:  ?Diagnosis Date  ? Anemia   ? on meds  ? Arthritis   ? hips/wrists/back  ? Cataract   ? sx-bilateral  ? Chronic kidney disease   ? stage 3  ? Diabetes mellitus without complication ([place])   ? on meds  ? Hypertension   ? on meds  ? Iron deficiency   ? on oral iron supplements, has not required transfusion  ? Mixed hyperlipidemia   ? on meds  ? OSA on CPAP   ? compliant  ? Sleep apnea   ? uses CPAP  ? Thyroid nodule   ? removed-not on meds at this time (12/01/2020)  ? ? ?PAST SURGICAL HISTORY: ?Past Surgical History:  ?Procedure Laterality Date  ? ABDOMINAL HYSTERECTOMY  1996  ? MASS EXCISION Right 2017  ? right arm mass removal  ? THYROID SURGERY    ? left side removal benign nodule  ? 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  ? Procedure: RIGHT TOTAL HIP ARTHROPLASTY ANTERIOR APPROACH;  Surgeon: Leandrew Koyanagi, MD;  Location: [facility];  Service: Orthopedics;  Laterality: Right;  3-C  ? ? ?FAMILY HISTORY: ?Family  History  ?Problem Relation Age of Onset  ? Stroke Mother   ? Hypertension Mother   ? Heart disease Mother   ? Heart attack Father   ? Early death Father   ? Hypertension Father   ? Diabetes Sister   ? Early death Sist

## 2022-02-28 NOTE — Progress Notes (Signed)
CPAP order sent to [facility]. Received a receipt of confirmation.

## 2022-03-13 ENCOUNTER — Encounter: Payer: Self-pay | Admitting: Physician Assistant

## 2022-03-13 LAB — URIC ACID: Uric Acid: 5.3

## 2022-04-09 ENCOUNTER — Encounter: Payer: Self-pay | Admitting: Physician Assistant

## 2022-04-09 DIAGNOSIS — I10 Essential (primary) hypertension: Secondary | ICD-10-CM

## 2022-05-03 ENCOUNTER — Other Ambulatory Visit: Payer: Self-pay | Admitting: Internal Medicine

## 2022-05-13 ENCOUNTER — Telehealth: Payer: Self-pay | Admitting: Physician Assistant

## 2022-05-13 MED ORDER — ONETOUCH ULTRA MINI W/DEVICE KIT: PACK | 0 refills | Status: AC

## 2022-05-13 NOTE — Telephone Encounter (Signed)
Spoke to pt's daughter Rockwell [place], told her I sent Rx for glucometer to the pharmacy [facility]. Willim[place] verbalized understanding.

## 2022-05-13 NOTE — Telephone Encounter (Signed)
Caller states: -pt lost glucose monitor on vacation.  -exact days without is unknown -pt does have lancets and test strips    LAST APPOINTMENT DATE:   02/18/22 with PCP  NEXT APPOINTMENT DATE: N/A   MEDICATION: One touch glucose monitor  Is the patient out of medication?  Yes  PHARMACY: [facility] [phone] - [place], [place] - [address] [place] [address]  [address] [place], [place] [address]  Phone:  [phone]  Fax:  [phone]  DEA #:  [id number]

## 2022-05-30 ENCOUNTER — Ambulatory Visit (HOSPITAL_BASED_OUTPATIENT_CLINIC_OR_DEPARTMENT_OTHER): Payer: Medicare Other | Admitting: Cardiology

## 2022-05-30 ENCOUNTER — Encounter (HOSPITAL_BASED_OUTPATIENT_CLINIC_OR_DEPARTMENT_OTHER): Payer: Self-pay | Admitting: Cardiology

## 2022-05-30 VITALS — BP 126/62 | HR 57 | Ht 66.0 in | Wt 153.3 lb

## 2022-05-30 DIAGNOSIS — E782 Mixed hyperlipidemia: Secondary | ICD-10-CM

## 2022-05-30 DIAGNOSIS — N1832 Chronic kidney disease, stage 3b: Secondary | ICD-10-CM

## 2022-05-30 DIAGNOSIS — E1122 Type 2 diabetes mellitus with diabetic chronic kidney disease: Secondary | ICD-10-CM | POA: Diagnosis not present

## 2022-05-30 DIAGNOSIS — I10 Essential (primary) hypertension: Secondary | ICD-10-CM | POA: Diagnosis not present

## 2022-05-30 DIAGNOSIS — Z7189 Other specified counseling: Secondary | ICD-10-CM | POA: Diagnosis not present

## 2022-05-30 DIAGNOSIS — Z794 Long term (current) use of insulin: Secondary | ICD-10-CM

## 2022-05-30 NOTE — Progress Notes (Signed)
Cardiology Office Note:    Date:  05/30/2022   ID:  YU CRAGUN, DOB [birth year], MRN [id number]  PCP:  Inda Coke, [place]  Cardiologist:  Buford Dresser, MD  Referring MD: Inda Coke, PA   new patient evaluation for hypertension  History of Present Illness:    Catherine Mcconnell is a 76 y.o. female with a hx of hypertension, OSA on CPAP, type II diabetes, chronic kidney disease stage 3a, mixed hyperlipidemia, who is seen as a new consult at the request of Inda Coke, [place] for the evaluation and management of hypertension.  Last seen by Inda Coke, PA on 02/18/22. Referred 04/22/22 for hypertension.  She is accompanied by her daughter today, who is also my patient. She is a retired Sports administrator. She has no specific cardiovascular concerns at this time.  Cardiovascular risk factors: Prior clinical ASCVD: none that she knows of Comorbid conditions: Hypertension - has had for decades. At home her BP was 116/72, 125 the day before. Has been as high as 150's if she does not follow her diet. Hyperlipidemia - on meds for a while now. Recently her cholesterol medication was lowered to taking it MWF only. This was started 02/22/2022 due to lab findings.  MWF  Diabetes - diagnosed about 20 years ago; her last A1C was 6.7. Occasionally she may wake up with her blood sugar as low as 78 around 7 AM. On Farxiga and Rybelsus. Chronic kidney disease - Dr. Hollie Salk is her nephrologist.  Exercise level: She often walks around her back yard. Sometimes stays busy with yard work including raking leaves. She denies feeling physically limited. They plan to obtain a [employer] membership for exercise during the winter. Current diet: Cream of wheat and cheese this morning. Enjoys cheese often.  Recently taken off of 81 mg ASA. No prior bleeding issues.   Typically she does not feel any palpitations.   She endorses LE edema in her feet from time to time, but usually not often.  She denies any chest  pain, or shortness of breath. No lightheadedness, headaches, syncope, orthopnea, or PND.  Past Medical History:  Diagnosis Date   Anemia    on meds   Arthritis    hips/wrists/back   Cataract    sx-bilateral   Chronic kidney disease    stage 3   Diabetes mellitus without complication (HCC)    on meds   Hypertension    on meds   Iron deficiency    on oral iron supplements, has not required transfusion   Mixed hyperlipidemia    on meds   OSA on CPAP    compliant   Sleep apnea    uses CPAP   Thyroid nodule    removed-not on meds at this time (12/01/2020)    Past Surgical History:  Procedure Laterality Date   ABDOMINAL HYSTERECTOMY  1996   MASS EXCISION Right 2017   right arm mass removal   THYROID SURGERY     left side removal benign nodule   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   Procedure: RIGHT TOTAL HIP ARTHROPLASTY ANTERIOR APPROACH;  Surgeon: Leandrew Koyanagi, MD;  Location: [facility];  Service: Orthopedics;  Laterality: Right;  3-C    Current Medications: Current Outpatient Medications on File Prior to Visit  Medication Sig   amLODipine (NORVASC) 10 MG tablet TAKE 1 TABLET BY MOUTH  DAILY   atorvastatin (LIPITOR) 80 MG tablet Take 80 mg by mouth. Every Monday, Wednesday and Friday   Blood Glucose Monitoring  Suppl (ONE TOUCH ULTRA MINI) w/Device KIT Use to check blood sugars twice a day.   carvedilol (COREG) 6.25 MG tablet TAKE 1 TABLET BY MOUTH  TWICE DAILY WITH A MEAL   donepezil (ARICEPT ODT) 10 MG disintegrating tablet DISSOLVE 1 TABLET ON THE  TONGUE AT BEDTIME   FARXIGA 10 MG TABS tablet Take 10 mg by mouth daily.   ferrous sulfate (IRON SUPPLEMENT) 325 (65 FE) MG tablet Take 1 tablet (325 mg total) by mouth 2 (two) times daily with a meal.   insulin degludec (TRESIBA FLEXTOUCH) 100 UNIT/ML FlexTouch Pen Inject 10 Units into the skin daily.   Insulin Pen Needle (TRUEPLUS PEN NEEDLES) 32G X 4 MM MISC 1 Device by Other route daily.   Lancets (ONETOUCH ULTRASOFT)  lancets CHECK TWICE PER DAY BEFORE  BREAKFAST AND DINNER   losartan (COZAAR) 50 MG tablet TAKE 1 TABLET BY MOUTH  DAILY   LUMIGAN 0.01 % SOLN Place 1 drop into both eyes every evening.   methocarbamol (ROBAXIN) 500 MG tablet Take 1 tablet (500 mg total) by mouth 2 (two) times daily as needed. To be taken after surgery   ONETOUCH ULTRA test strip USE TO TEST BLOOD SUGARS  TWICE DAILY   RYBELSUS 14 MG TABS TAKE 1 TABLET BY MOUTH  DAILY   No current facility-administered medications on file prior to visit.     Allergies:   Lisinopril and Metformin and related   Social History   Tobacco Use   Smoking status: Former    Packs/day: 0.25    Years: 1.00    Total pack years: 0.25    Types: Cigarettes    Quit date: 1960    Years since quitting: 63.6   Smokeless tobacco: Never   Tobacco comments:    only socially in high school  Vaping Use   Vaping Use: Never used  Substance Use Topics   Alcohol use: Not Currently   Drug use: Never    Family History: family history includes Arthritis in her brother and brother; Cancer in her son; Depression in her daughter; Diabetes in her sister, sister, sister, and sister; Early death in her brother, father, sister, sister, and son; Heart attack in her brother and father; Heart disease in her brother and mother; Hyperlipidemia in her daughter, sister, and sister; Hypertension in her daughter, father, mother, sister, and sister; Kidney disease in her sister; Memory loss in her paternal uncle; Prostate cancer in her son; Stroke in her mother and sister. There is no history of Colon polyps, Colon cancer, Esophageal cancer, Rectal cancer, Stomach cancer, or Sleep apnea.  ROS:   Please see the history of present illness.  Additional pertinent ROS: Constitutional: Negative for chills, fever, night sweats, unintentional weight loss  HENT: Negative for ear pain and hearing loss.   Eyes: Negative for loss of vision and eye pain.  Respiratory: Negative for cough,  sputum, wheezing.   Cardiovascular: See HPI. Gastrointestinal: Negative for abdominal pain, melena, and hematochezia.  Genitourinary: Negative for dysuria and hematuria.  Musculoskeletal: Negative for falls and myalgias.  Skin: Negative for itching and rash.  Neurological: Negative for focal weakness, focal sensory changes and loss of consciousness.  Endo/Heme/Allergies: Does not bruise/bleed easily.     EKGs/Labs/Other Studies Reviewed:    The following studies were reviewed today:  Right LE Venous Doppler  02/16/2022: IMPRESSION: No evidence of RIGHT LOWER extremity DVT.  Echocardiogram  06/12/2021: Left ventricle cavity is normal in size. Mild concentric hypertrophy of  the left  ventricle. Normal global wall motion. Normal LV systolic function  with EF 60%. Doppler evidence of grade I (impaired) diastolic dysfunction,  normal LAP.  Structurally normal trileaflet aortic valve. Mild (Grade I) aortic  regurgitation.  No evidence of pulmonary hypertension.  EKG:  EKG is personally reviewed.   05/30/2022:  sinus bradycardia at 57 bpm, borderline LVH  Recent Labs: 02/20/2022: ALT 20; BUN 33; Creatinine 1.8; Hemoglobin 13.0; Platelets 222; Potassium 3.9; Sodium 138   Recent Lipid Panel    Component Value Date/Time   CHOL 215 (H) 06/11/2021 0848   TRIG 102 06/11/2021 0848   HDL 61 06/11/2021 0848   CHOLHDL 3.5 06/11/2021 0848   CHOLHDL 3.6 06/06/2020 1113   VLDL 17.2 08/13/2018 0916   LDLCALC 136 (H) 06/11/2021 0848   LDLCALC 134 (H) 06/06/2020 1113    Physical Exam:    VS:  BP 126/62 (BP Location: Right Arm, Patient Position: Sitting, Cuff Size: Normal)   Pulse (!) 57   Ht $R'5\' 6"'bk$  (1.676 m)   Wt 153 lb 4.8 oz (69.5 kg)   BMI 24.74 kg/m     Wt Readings from Last 3 Encounters:  05/30/22 153 lb 4.8 oz (69.5 kg)  02/27/22 148 lb 6.4 oz (67.3 kg)  02/18/22 147 lb (66.7 kg)    GEN: Well nourished, well developed in no acute distress HEENT: Normal, moist mucous  membranes NECK: No JVD CARDIAC: regular rhythm, normal S1 and S2, no rubs or gallops. 1/6 systolic murmur. VASCULAR: Radial and DP pulses 2+ bilaterally. No carotid bruits RESPIRATORY:  Clear to auscultation without rales, wheezing or rhonchi  ABDOMEN: Soft, non-tender, non-distended MUSCULOSKELETAL:  Ambulates independently SKIN: Warm and dry, no edema NEUROLOGIC:  Alert and oriented x 3. No focal neuro deficits noted. PSYCHIATRIC:  Normal affect    ASSESSMENT:    1. Essential hypertension   2. Mixed hyperlipidemia   3. Type 2 diabetes mellitus with stage 3b chronic kidney disease, with long-term current use of insulin (HCC)   4. Cardiac risk counseling   5. Counseling on health promotion and disease prevention    PLAN:    Hypertension -at goal today -continue amlodipine, carvedilol, losartan  Mixed hyperlipidemia -continue atorvastatin 80 mg daily -aspirin stopped previously -last LDL 61, almost a year old. Recheck ordered -we reviewed the pros/cons of calcium scores. A cardiac CT scan for coronary calcium is a non-invasive way of obtaining information about the presence, location and extent of calcified plaque in the coronary arteries--the vessels that supply oxygen-containing blood to the heart muscle. Calcified plaque results when there is a build-up of fat and other substances under the inner layer of the artery. This material can calcify which signals the presence of atherosclerosis, a disease of the vessel wall, also called coronary artery disease.  People with this disease have an increased risk for heart attacks. In addition, over time, progression of plaque build up (CAD) can narrow the arteries or even close off blood flow to the heart. Because calcium is a marker of CAD, the amount of calcium detected on a cardiac CT scan is a helpful prognostic tool.  -we reviewed the charts together which show the relationship between calcium score and 15 year all cause mortality -after  shared decision making, will proceed with coronary calcium score. They understand this is an out of pocket/self pay test currently costing $99. -if calcium score elevated, would intensify lipid therapy  Chronic kidney disease stage 3b -followed by Dr. Hollie Salk at [facility] -on losartan, farxiga  Type II diabetes -on farxiga, rybelsus, and insulin  Cardiac risk counseling and prevention recommendations: -recommend heart healthy/Mediterranean diet, with whole grains, fruits, vegetable, fish, lean meats, nuts, and olive oil. Limit salt. -recommend moderate walking, 3-5 times/week for 30-50 minutes each session. Aim for at least 150 minutes.week. Goal should be pace of 3 miles/hours, or walking 1.5 miles in 30 minutes -recommend avoidance of tobacco products. Avoid excess alcohol. -ASCVD risk score: The 10-year ASCVD risk score (Arnett DK, et al., 2019) is: 35.2%   Values used to calculate the score:     Age: 62 years     Sex: Female     Is Non-Hispanic African American: Yes     Diabetic: Yes     Tobacco smoker: No     Systolic Blood Pressure: 633 mmHg     Is BP treated: Yes     HDL Cholesterol: 61 mg/dL     Total Cholesterol: 215 mg/dL    Plan for follow up: 1 year or sooner as needed.  Buford Dresser, MD, PhD, [facility]    Medication Adjustments/Labs and Tests Ordered: Current medicines are reviewed at length with the patient today.  Concerns regarding medicines are outlined above.   Orders Placed This Encounter  Procedures   CT CARDIAC SCORING (SELF PAY ONLY)   Lipid panel   EKG 12-Lead   No orders of the defined types were placed in this encounter.  Patient Instructions  Medication Instructions:  Your Physician recommend you continue on your current medication as directed.    *If you need a refill on your cardiac medications before your next appointment, please call your pharmacy*   Lab Work: Your provider has recommended lab work  (fasting lipid). Please have this collected at [facility] [place]. The lab is open 8:00 am - 4:30 pm. Please avoid 12:00p - 1:00p for lunch hour. You do not need an appointment. Please go to [phone] [place], [place] [address]. This is in the Primary Care office on the 3rd floor, let them know you are there for blood work and they will direct you to the lab.  If you have labs (blood work) drawn today and your tests are completely normal, you will receive your results only by: [place] (if you have MyChart) OR A paper copy in the mail If you have any lab test that is abnormal or we need to change your treatment, we will call you to review the results.   Testing/Procedures: CT coronary calcium score.   Test locations:  [place]   This is $99 out of pocket.   Coronary CalciumScan A coronary calcium scan is an imaging test used to look for deposits of calcium and other fatty materials (plaques) in the inner lining of the blood vessels of the heart (coronary arteries). These deposits of calcium and plaques can partly clog and narrow the coronary arteries without producing any symptoms or warning signs. This puts a person at risk for a heart attack. This test can detect these deposits before symptoms develop. Tell a health care provider about: Any allergies you have. All medicines you are taking, including vitamins, herbs, eye drops, creams, and over-the-counter medicines. Any problems you or family members have had with anesthetic medicines. Any blood disorders you have. Any surgeries you have had. Any medical conditions you have. Whether you are pregnant or may be pregnant. What are the risks? Generally, this is a safe procedure. However, problems may  occur, including: Harm to a pregnant woman and her unborn baby. This test involves the use of radiation. Radiation exposure can be dangerous to a pregnant woman and her unborn baby. If you  are pregnant, you generally should not have this procedure done. Slight increase in the risk of cancer. This is because of the radiation involved in the test. What happens before the procedure? No preparation is needed for this procedure. What happens during the procedure? You will undress and remove any jewelry around your neck or chest. You will put on a hospital gown. Sticky electrodes will be placed on your chest. The electrodes will be connected to an electrocardiogram (ECG) machine to record a tracing of the electrical activity of your heart. A CT scanner will take pictures of your heart. During this time, you will be asked to lie still and hold your breath for 2-3 seconds while a picture of your heart is being taken. The procedure may vary among health care providers and hospitals. What happens after the procedure? You can get dressed. You can return to your normal activities. It is up to you to get the results of your test. Ask your health care provider, or the department that is doing the test, when your results will be ready. Summary A coronary calcium scan is an imaging test used to look for deposits of calcium and other fatty materials (plaques) in the inner lining of the blood vessels of the heart (coronary arteries). Generally, this is a safe procedure. Tell your health care provider if you are pregnant or may be pregnant. No preparation is needed for this procedure. A CT scanner will take pictures of your heart. You can return to your normal activities after the scan is done. This information is not intended to replace advice given to you by your health care provider. Make sure you discuss any questions you have with your health care provider. Document Released: 03/28/2008 Document Revised: 08/19/2016 Document Reviewed: 08/19/2016 Elsevier Interactive Patient Education  2017 [place]: At [facility], you and your health needs are our priority.  As part  of our continuing mission to provide you with exceptional heart care, we have created designated Provider Care Teams.  These Care Teams include your primary Cardiologist (physician) and Advanced Practice Providers (APPs -  Physician Assistants and Nurse Practitioners) who all work together to provide you with the care you need, when you need it.  We recommend signing up for the patient portal called "MyChart".  Sign up information is provided on this After Visit Summary.  MyChart is used to connect with patients for Virtual Visits (Telemedicine).  Patients are able to view lab/test results, encounter notes, upcoming appointments, etc.  Non-urgent messages can be sent to your provider as well.   To learn more about what you can do with MyChart, go to [email or web address].    Your next appointment:   1 year(s)  The format for your next appointment:   In Person  Provider:   Buford Dresser, MD{           I,Mathew Stumpf,acting as a scribe for Buford Dresser, MD.,have documented all relevant documentation on the behalf of Buford Dresser, MD,as directed by  Buford Dresser, MD while in the presence of Buford Dresser, MD.  I, Buford Dresser, MD, have reviewed all documentation for this visit. The documentation on 07/02/22 for the exam, diagnosis, procedures, and orders are all accurate and complete.   Signed, Buford Dresser, MD PhD 05/30/2022  [facility]

## 2022-05-30 NOTE — Patient Instructions (Signed)
Medication Instructions:  Your Physician recommend you continue on your current medication as directed.    *If you need a refill on your cardiac medications before your next appointment, please call your pharmacy*   Lab Work: Your provider has recommended lab work (fasting lipid). Please have this collected at [facility] at [place]. The lab is open 8:00 am - 4:30 pm. Please avoid 12:00p - 1:00p for lunch hour. You do not need an appointment. Please go to [address] [place], [place] [address]. This is in the Primary Care office on the 3rd floor, let them know you are there for blood work and they will direct you to the lab.  If you have labs (blood work) drawn today and your tests are completely normal, you will receive your results only by: [place] (if you have MyChart) OR A paper copy in the mail If you have any lab test that is abnormal or we need to change your treatment, we will call you to review the results.   Testing/Procedures: CT coronary calcium score.   Test locations:  [place]   This is $99 out of pocket.   Coronary CalciumScan A coronary calcium scan is an imaging test used to look for deposits of calcium and other fatty materials (plaques) in the inner lining of the blood vessels of the heart (coronary arteries). These deposits of calcium and plaques can partly clog and narrow the coronary arteries without producing any symptoms or warning signs. This puts a person at risk for a heart attack. This test can detect these deposits before symptoms develop. Tell a health care provider about: Any allergies you have. All medicines you are taking, including vitamins, herbs, eye drops, creams, and over-the-counter medicines. Any problems you or family members have had with anesthetic medicines. Any blood disorders you have. Any surgeries you have had. Any medical conditions you have. Whether you are pregnant or may be  pregnant. What are the risks? Generally, this is a safe procedure. However, problems may occur, including: Harm to a pregnant woman and her unborn baby. This test involves the use of radiation. Radiation exposure can be dangerous to a pregnant woman and her unborn baby. If you are pregnant, you generally should not have this procedure done. Slight increase in the risk of cancer. This is because of the radiation involved in the test. What happens before the procedure? No preparation is needed for this procedure. What happens during the procedure? You will undress and remove any jewelry around your neck or chest. You will put on a hospital gown. Sticky electrodes will be placed on your chest. The electrodes will be connected to an electrocardiogram (ECG) machine to record a tracing of the electrical activity of your heart. A CT scanner will take pictures of your heart. During this time, you will be asked to lie still and hold your breath for 2-3 seconds while a picture of your heart is being taken. The procedure may vary among health care providers and hospitals. What happens after the procedure? You can get dressed. You can return to your normal activities. It is up to you to get the results of your test. Ask your health care provider, or the department that is doing the test, when your results will be ready. Summary A coronary calcium scan is an imaging test used to look for deposits of calcium and other fatty materials (plaques) in the inner lining of the blood vessels of the heart (coronary arteries). Generally,  this is a safe procedure. Tell your health care provider if you are pregnant or may be pregnant. No preparation is needed for this procedure. A CT scanner will take pictures of your heart. You can return to your normal activities after the scan is done. This information is not intended to replace advice given to you by your health care provider. Make sure you discuss any questions  you have with your health care provider. Document Released: 03/28/2008 Document Revised: 08/19/2016 Document Reviewed: 08/19/2016 Elsevier Interactive Patient Education  2017 [place]: At [facility], you and your health needs are our priority.  As part of our continuing mission to provide you with exceptional heart care, we have created designated Provider Care Teams.  These Care Teams include your primary Cardiologist (physician) and Advanced Practice Providers (APPs -  Physician Assistants and Nurse Practitioners) who all work together to provide you with the care you need, when you need it.  We recommend signing up for the patient portal called "MyChart".  Sign up information is provided on this After Visit Summary.  MyChart is used to connect with patients for Virtual Visits (Telemedicine).  Patients are able to view lab/test results, encounter notes, upcoming appointments, etc.  Non-urgent messages can be sent to your provider as well.   To learn more about what you can do with MyChart, go to [email or web address].    Your next appointment:   1 year(s)  The format for your next appointment:   In Person  Provider:   Buford Dresser, MD{

## 2022-06-07 LAB — LIPID PANEL
Chol/HDL Ratio: 3 ratio (ref 0.0–4.4)
Cholesterol, Total: 201 mg/dL — ABNORMAL HIGH (ref 100–199)
HDL: 66 mg/dL (ref >39)
LDL Chol Calc (NIH): 122 mg/dL — ABNORMAL HIGH (ref 0–99)
Triglycerides: 71 mg/dL (ref 0–149)
VLDL Cholesterol Cal: 13 mg/dL (ref 5–40)

## 2022-06-11 ENCOUNTER — Telehealth (HOSPITAL_BASED_OUTPATIENT_CLINIC_OR_DEPARTMENT_OTHER): Payer: Self-pay | Admitting: Cardiology

## 2022-06-11 NOTE — Telephone Encounter (Signed)
Spoke with daughter---she has not had a chance to call and schedule the Calcium scoring ordered by Dr. Levora Angel will take care of it within a few days

## 2022-07-02 ENCOUNTER — Encounter (HOSPITAL_BASED_OUTPATIENT_CLINIC_OR_DEPARTMENT_OTHER): Payer: Self-pay | Admitting: Cardiology

## 2022-07-08 ENCOUNTER — Encounter: Payer: Self-pay | Admitting: *Deleted

## 2022-07-11 ENCOUNTER — Ambulatory Visit (HOSPITAL_BASED_OUTPATIENT_CLINIC_OR_DEPARTMENT_OTHER)
Admission: RE | Admit: 2022-07-11 | Discharge: 2022-07-11 | Disposition: A | Discharge status: Home / Self Care | Payer: Medicare Other | Source: Ambulatory Visit | Attending: Cardiology | Admitting: Cardiology

## 2022-07-11 ENCOUNTER — Ambulatory Visit: Payer: Medicare Other | Admitting: Internal Medicine

## 2022-07-11 ENCOUNTER — Encounter: Payer: Self-pay | Admitting: Internal Medicine

## 2022-07-11 VITALS — BP 162/80 | HR 62 | Ht 66.0 in | Wt 152.4 lb

## 2022-07-11 DIAGNOSIS — E1122 Type 2 diabetes mellitus with diabetic chronic kidney disease: Secondary | ICD-10-CM | POA: Diagnosis not present

## 2022-07-11 DIAGNOSIS — N1831 Chronic kidney disease, stage 3a: Secondary | ICD-10-CM

## 2022-07-11 DIAGNOSIS — E782 Mixed hyperlipidemia: Secondary | ICD-10-CM | POA: Insufficient documentation

## 2022-07-11 LAB — POCT GLYCOSYLATED HEMOGLOBIN (HGB A1C): Hemoglobin A1C: 6.3 % — AB (ref 4.0–5.6)

## 2022-07-11 MED ORDER — RYBELSUS 14 MG PO TABS: 1.0000 | ORAL_TABLET | Freq: Every day | ORAL | 3 refills | Status: DC

## 2022-07-11 NOTE — Patient Instructions (Addendum)
-   STOP Insulin Tyler Aas ) - Continue  Rybelsus 14 mg daily  With Breakfast  - Continue Farxiga 10 mg daily    HOW TO TREAT LOW BLOOD SUGARS (Blood sugar LESS THAN 70 MG/DL) Please follow the RULE OF 15 for the treatment of hypoglycemia treatment (when your (blood sugars are less than 70 mg/dL)   STEP 1: Take 15 grams of carbohydrates when your blood sugar is low, which includes:  3-4 GLUCOSE TABS  OR 3-4 OZ OF JUICE OR REGULAR SODA OR ONE TUBE OF GLUCOSE GEL    STEP 2: RECHECK blood sugar in 15 MINUTES STEP 3: If your blood sugar is still low at the 15 minute recheck --> then, go back to STEP 1 and treat AGAIN with another 15 grams of carbohydrates.

## 2022-07-11 NOTE — Progress Notes (Signed)
Name: Catherine Mcconnell  Age/ Sex: 76 y.o., female   MRN/ DOB: [id number], 1945/11/30     PCP: Inda Coke, PA   Reason for Endocrinology Evaluation: Type 2 Diabetes Mellitus  Initial Endocrine Consultative Visit: 08/10/2019    PATIENT IDENTIFIER: Catherine Mcconnell is a 76 y.o. female with a past medical history of T2DM and HTN. The patient has followed with Endocrinology clinic since 08/10/2019 for consultative assistance with management of her diabetes.  DIABETIC HISTORY:  Ms. Heldman was diagnosed with DM at age 16.  Her hemoglobin A1c has ranged from 7.7% in 2021, peaking at 10.6%  in 2021    She transitioned care from Dr. Loanne Drilling in  05/2020. She was on Januvia, Rybelsus and had tresiba on her med list but she has stopped it due to swelling. Her A1c was 10.6 % in 04/2020 . We stopped Januvia, increased rybelsus and restarted Wendie Agreste through Nephrology 10/2021  SUBJECTIVE:   During the last visit (01/10/2022): A1c 6.7 % .      Today (07/11/2022): Catherine Mcconnell is here for a follow up on diabetes management. She is accompanied by her daughter.  She checks her blood sugars 2 times daily. The patient has had hypoglycemic episodes since the last clinic visit.  Patient is symptomatic with these episodes  Denies nausea , diarrhea or vomiting   She was seen by neurology for a follow up on OSA Has muscle cramps    HOME DIABETES REGIMEN:  Rybelsus 14 mg daily  With Breakfast  Farxiga 10 mg daily  Tresiba 10 units daily     Statin: yes ACE-I/ARB: yes    METER DOWNLOAD SUMMARY:unable to download 58-129 mg/dL       DIABETIC COMPLICATIONS: Microvascular complications:  Neuropathy, CKD III Denies: retinopathy Last Eye Exam: Completed 12/03/2021  Macrovascular complications:   Denies: CAD, CVA, PVD   HISTORY:  Past Medical History:  Past Medical History:  Diagnosis Date   Anemia    on meds   Arthritis    hips/wrists/back   Cataract     sx-bilateral   Chronic kidney disease    stage 3   Diabetes mellitus without complication ([place])    on meds   Hypertension    on meds   Iron deficiency    on oral iron supplements, has not required transfusion   Mixed hyperlipidemia    on meds   OSA on CPAP    compliant   Sleep apnea    uses CPAP   Thyroid nodule    removed-not on meds at this time (12/01/2020)   Past Surgical History:  Past Surgical History:  Procedure Laterality Date   ABDOMINAL HYSTERECTOMY  1996   MASS EXCISION Right 2017   right arm mass removal   THYROID SURGERY     left side removal benign nodule   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   Procedure: RIGHT TOTAL HIP ARTHROPLASTY ANTERIOR APPROACH;  Surgeon: Leandrew Koyanagi, MD;  Location: [facility];  Service: Orthopedics;  Laterality: Right;  3-C   Social History:  reports that she quit smoking about 63 years ago. Her smoking use included cigarettes. She has a 0.25 pack-year smoking history. She has never used smokeless tobacco. She reports that she does not currently use alcohol. She reports that she does not use drugs. Family History:  Family History  Problem Relation Age of Onset   Stroke Mother    Hypertension Mother    Heart disease Mother  Heart attack Father    Early death Father    Hypertension Father    Diabetes Sister    Early death Sister    Hypertension Sister    Stroke Sister    Diabetes Sister    Early death Sister    Hyperlipidemia Sister    Kidney disease Sister    Diabetes Sister    Hyperlipidemia Sister    Hypertension Sister    Diabetes Sister    Early death Brother    Arthritis Brother    Heart attack Brother    Heart disease Brother    Arthritis Brother    Memory loss Paternal Uncle    Depression Daughter    Hypertension Daughter    Hyperlipidemia Daughter    Prostate cancer Son    Cancer Son    Early death Son    Colon polyps Neg Hx    Colon cancer Neg Hx    Esophageal cancer Neg Hx    Rectal cancer Neg Hx     Stomach cancer Neg Hx    Sleep apnea Neg Hx      HOME MEDICATIONS: Allergies as of 07/11/2022       Reactions   Lisinopril Rash   Metformin And Related Swelling        Medication List        Accurate as of July 11, 2022 10:25 AM. If you have any questions, ask your nurse or doctor.          amLODipine 10 MG tablet Commonly known as: NORVASC TAKE 1 TABLET BY MOUTH  DAILY   atorvastatin 80 MG tablet Commonly known as: LIPITOR Take 80 mg by mouth. Every Monday, Wednesday and Friday   carvedilol 6.25 MG tablet Commonly known as: COREG TAKE 1 TABLET BY MOUTH  TWICE DAILY WITH A MEAL   donepezil 10 MG disintegrating tablet Commonly known as: ARICEPT ODT DISSOLVE 1 TABLET ON THE  TONGUE AT BEDTIME   Farxiga 10 MG Tabs tablet Generic drug: dapagliflozin propanediol Take 10 mg by mouth daily.   ferrous sulfate 325 (65 FE) MG tablet Commonly known as: Iron Supplement Take 1 tablet (325 mg total) by mouth 2 (two) times daily with a meal.   losartan 50 MG tablet Commonly known as: COZAAR TAKE 1 TABLET BY MOUTH  DAILY   Lumigan 0.01 % Soln Generic drug: bimatoprost Place 1 drop into both eyes every evening.   methocarbamol 500 MG tablet Commonly known as: Robaxin Take 1 tablet (500 mg total) by mouth 2 (two) times daily as needed. To be taken after surgery   ONE TOUCH ULTRA MINI w/Device Kit Use to check blood sugars twice a day.   OneTouch Ultra test strip Generic drug: glucose blood USE TO TEST BLOOD SUGARS  TWICE DAILY   onetouch ultrasoft lancets CHECK TWICE PER DAY BEFORE  BREAKFAST AND DINNER   Rybelsus 14 MG Tabs Generic drug: Semaglutide TAKE 1 TABLET BY MOUTH  DAILY   Tresiba FlexTouch 100 UNIT/ML FlexTouch Pen Generic drug: insulin degludec Inject 10 Units into the skin daily.   TRUEplus Pen Needles 32G X 4 MM Misc Generic drug: Insulin Pen Needle 1 Device by Other route daily.         OBJECTIVE:   Vital Signs: BP (!) 162/80  (BP Location: Left Arm, Patient Position: Sitting, Cuff Size: Normal)   Pulse 62   Ht _0  (1.676 m)   Wt 152 lb 6.4 oz (69.1 kg)   SpO2 100%  BMI 24.60 kg/m   Wt Readings from Last 3 Encounters:  07/11/22 152 lb 6.4 oz (69.1 kg)  05/30/22 153 lb 4.8 oz (69.5 kg)  02/27/22 148 lb 6.4 oz (67.3 kg)     Exam: General: Pt appears well and is in NAD  Lungs: Clear with good BS bilat with no rales, rhonchi, or wheezes  Heart: RRR   Extremities: No pretibial edema. .  Neuro: MS is good with appropriate affect, pt is alert and Ox3      DM foot exam: 01/10/2022   The skin of the feet is intact without sores or ulcerations.Toe nails are dystrophic  The pedal pulses are 2+ on right and 2+ on left. The sensation is intact to a screening 5.07, 10 gram monofilament bilaterally   DATA REVIEWED:  Lab Results  Component Value Date   HGBA1C 6.3 (A) 07/11/2022   HGBA1C 6.7 (A) 01/10/2022   HGBA1C 7.8 (A) 07/11/2021   Lab Results  Component Value Date   MICROALBUR 140.4 06/06/2020   LDLCALC 122 (H) 06/06/2022   CREATININE 1.8 (A) 02/20/2022   Lab Results  Component Value Date   MICRALBCREAT 1,543 (H) 06/06/2020     Lab Results  Component Value Date   CHOL 201 (H) 06/06/2022   HDL 66 06/06/2022   LDLCALC 122 (H) 06/06/2022   TRIG 71 06/06/2022   CHOLHDL 3.0 06/06/2022         ASSESSMENT / PLAN / RECOMMENDATIONS:   1) Type 2 Diabetes Mellitus,Optimally controlled, With neuropathic and CKD III  complications - Most recent A1c of 6.3 %. Goal A1c < 7.5 %.     -Patient with recurrent hypoglycemic episodes and an A1c of 6.3% -I will discontinue her insulin and she will continue on Rybelsus and Farxiga - She has been on Farxiga through nephrology     MEDICATIONS: -Stop Tresiba - Continue Rybelsus 14 mg daily  With Breakfast  - Continue Farxiga through nephrology    EDUCATION / INSTRUCTIONS: BG monitoring instructions: Patient is instructed to check her blood sugars 2  times a day, fasting and bedtime . Call [facility] if: BG persistently < 70  I reviewed the Rule of 15 for the treatment of hypoglycemia in detail with the patient. Literature supplied.   2) Diabetic complications:  Eye: Does not have known diabetic retinopathy.  Neuro/ Feet: Does  have known diabetic peripheral neuropathy .  Renal: Patient does  have known baseline CKD. She   is  on an ACEI/ARB at present.     F/U in 6  months    Signed electronically by: Mack Guise, MD  [facility]  [address] [place], [place] [address] Phone: [phone] FAX: [phone]   CC: Inda Coke, [place] [address] Phone: [phone]  Fax: [phone]  Return to Endocrinology clinic as below: Future Appointments  Date Time Provider [place]  07/11/2022  1:00 PM [facility] 1 [facility]  08/08/2022  9:30 AM Leandrew Koyanagi, MD [facility] None  10/21/2022  9:30 AM [facility] HEALTH COACH [facility]  03/03/2023 10:30 AM Ward Givens, NP [facility] None

## 2022-07-12 ENCOUNTER — Telehealth (HOSPITAL_BASED_OUTPATIENT_CLINIC_OR_DEPARTMENT_OTHER): Payer: Self-pay | Admitting: Cardiology

## 2022-07-12 ENCOUNTER — Encounter (HOSPITAL_BASED_OUTPATIENT_CLINIC_OR_DEPARTMENT_OTHER): Payer: Self-pay

## 2022-07-12 DIAGNOSIS — E782 Mixed hyperlipidemia: Secondary | ICD-10-CM

## 2022-07-12 NOTE — Telephone Encounter (Signed)
Daughter returned RN's call. 

## 2022-07-12 NOTE — Telephone Encounter (Signed)
    "  Coronary CTA with coronary calcium score of 2953.  This places her in the 99th percentile for age, race, sex matched control.   Recommend aggressive secondary prevention with aspirin 81 mg daily, atorvastatin 80 mg daily, PRN nitroglycerin.   Recommend office visit to review results and assess for any symptoms. If noted, may need to consider further ischemic evaluation. "

## 2022-07-15 NOTE — Telephone Encounter (Signed)
I think lipid clinic is ok--they can talk about all the options with her. I'm happy to see her first if she'd prefer, but otherwise lipid clinic is fine. Thanks!

## 2022-07-16 MED ORDER — NITROGLYCERIN 0.4 MG SL SUBL: 0.4000 mg | SUBLINGUAL_TABLET | SUBLINGUAL | 4 refills | Status: DC | PRN

## 2022-07-16 MED ORDER — ASPIRIN 81 MG PO TBEC: 81.0000 mg | DELAYED_RELEASE_TABLET | Freq: Every day | ORAL | 3 refills | Status: AC

## 2022-07-16 MED ORDER — ATORVASTATIN CALCIUM 80 MG PO TABS: 80.0000 mg | ORAL_TABLET | Freq: Every day | ORAL | 3 refills | Status: DC

## 2022-07-16 NOTE — Telephone Encounter (Signed)
Call attempt no answer, will send follow up mychart message.     ""Coronary CTA with coronary calcium score of 2953.  This places her in the 99th percentile for age, race, sex matched control.   Recommend aggressive secondary prevention with aspirin 81 mg daily, atorvastatin 80 mg daily, PRN nitroglycerin.   Recommend office visit to review results and assess for any symptoms. If noted, may need to consider further ischemic evaluation. "   Per Dr. Harrell Gave- "I think lipid clinic is ok--they can talk about all the options with her. I'm happy to see her first if she'd prefer, but otherwise lipid clinic is fine. Thanks!"

## 2022-07-17 ENCOUNTER — Ambulatory Visit (HOSPITAL_BASED_OUTPATIENT_CLINIC_OR_DEPARTMENT_OTHER): Payer: Medicare Other | Admitting: Cardiology

## 2022-07-17 NOTE — Telephone Encounter (Signed)
Pt' daughter advised via mychart

## 2022-07-24 ENCOUNTER — Telehealth (HOSPITAL_BASED_OUTPATIENT_CLINIC_OR_DEPARTMENT_OTHER): Payer: Self-pay

## 2022-07-24 ENCOUNTER — Telehealth: Payer: Self-pay | Admitting: Cardiology

## 2022-07-24 MED ORDER — ATORVASTATIN CALCIUM 80 MG PO TABS: ORAL_TABLET | ORAL | 3 refills | Status: DC

## 2022-07-24 NOTE — Telephone Encounter (Signed)
Returned call to pharmacy to clarify that patient should be back to daily use, left message with prescription instructions!

## 2022-07-24 NOTE — Telephone Encounter (Signed)
Received fax from [facility] requesting clarification for Atorvastatin. Rx clarification sent to pharmacy.

## 2022-07-24 NOTE — Telephone Encounter (Signed)
Pt c/o medication issue:  1. Name of Medication: atorvastatin (LIPITOR) 80 MG tablet  2. How are you currently taking this medication (dosage and times per day)?   3. Are you having a reaction (difficulty breathing--STAT)?   4. What is your medication issue? Pharmacy calling needing clarification on whether pt is still taking this medication MWF or daily now. Please advise

## 2022-08-01 ENCOUNTER — Other Ambulatory Visit: Payer: Self-pay | Admitting: Physician Assistant

## 2022-08-06 ENCOUNTER — Ambulatory Visit: Payer: Medicare Other | Attending: Cardiology | Admitting: Pharmacist

## 2022-08-06 ENCOUNTER — Encounter: Payer: Self-pay | Admitting: Pharmacist

## 2022-08-06 ENCOUNTER — Telehealth: Payer: Self-pay | Admitting: Pharmacist

## 2022-08-06 DIAGNOSIS — E782 Mixed hyperlipidemia: Secondary | ICD-10-CM | POA: Diagnosis not present

## 2022-08-06 DIAGNOSIS — R931 Abnormal findings on diagnostic imaging of heart and coronary circulation: Secondary | ICD-10-CM | POA: Insufficient documentation

## 2022-08-06 NOTE — Patient Instructions (Addendum)
It was nice to meet you two today  We would like your LDL (bad cholesterol) to be less than 55  Please continue your atorvastatin '80mg'$  three times a week  We would like to start a new medication called Repatha which you will inject once every 2 weeks  I will complete the prior authorization for you and contact you when it is complete. I will also send in your patient assistance form  Once you start the medication we will recheck your cholesterol in 2-3 months  Karren Cobble, PharmD, [place], [place], [place], [address] Phone: [phone], Fax: [phone]

## 2022-08-06 NOTE — Telephone Encounter (Signed)
PA for Repatha submitted.  Key: [id number]

## 2022-08-06 NOTE — Telephone Encounter (Signed)
PA for Repatha approved through 02/05/23.  Patient assistance application faxed to [employer]

## 2022-08-06 NOTE — Progress Notes (Unsigned)
Patient ID: TWYLIA OKA                 DOB: [birth year]                    MRN: [id number]     HPI: Catherine Mcconnell is a 76 y.o. female patient referred to lipid clinic by Dr Harrell Gave. PMH is significant for CAD, CKD, elevated coronary calcium score, T2DM (A1c 6.3) and HTN.  Patient presents today with daughter Catherine Mcconnell who lives with her. Daughter purchases food and has been working on trying to control patient's A1c. Able to tolerate atorvastatin 59m three times weekly.  Does not follow an exercise plan but is active in her yard. Lives on a large plot of land and takes care of pigs. [facility]  Denies SOB, chest pain or dizziness.   Coronary calcium score very elevated.  Coronary calcium score of 2953. This was 99th percentile for age-, race-, and sex-matched controls. 2. Aorta: Borderline dilated to 39 mm at the level of the main PA bifurcation. 3. Aortic atherosclerosis.  Current Medications:  Atorvastatin 814mthree times weekly  Risk Factors:  HLD Family history Elevated coronary calcium score T2DM  LDL goal: <55  Family History:  Stroke, CHF (Mother) MI (Father)  Social History: No EtOH or tobacco  Labs: TC 201, Trigs 71, HDL 66, LDL 122  Past Medical History:  Diagnosis Date   Anemia    on meds   Arthritis    hips/wrists/back   Cataract    sx-bilateral   Chronic kidney disease    stage 3   Diabetes mellitus without complication (H[facility])    on meds   Hypertension    on meds   Iron deficiency    on oral iron supplements, has not required transfusion   Mixed hyperlipidemia    on meds   OSA on CPAP    compliant   Sleep apnea    uses CPAP   Thyroid nodule    removed-not on meds at this time (12/01/2020)    Current Outpatient Medications on File Prior to Visit  Medication Sig Dispense Refill   amLODipine (NORVASC) 10 MG tablet TAKE 1 TABLET BY MOUTH  DAILY 90 tablet 3   aspirin EC 81 MG tablet Take 1 tablet (81 mg  total) by mouth daily. Swallow whole. 90 tablet 3   atorvastatin (LIPITOR) 80 MG tablet Take 1 tablet by mouth every Monday, Wednesday, and Friday. 90 tablet 3   Blood Glucose Monitoring Suppl (ONE TOUCH ULTRA MINI) w/Device KIT Use to check blood sugars twice a day. 1 kit 0   carvedilol (COREG) 6.25 MG tablet TAKE 1 TABLET BY MOUTH  TWICE DAILY WITH A MEAL 180 tablet 3   donepezil (ARICEPT ODT) 10 MG disintegrating tablet DISSOLVE 1 TABLET ON THE  TONGUE AT BEDTIME 90 tablet 3   FARXIGA 10 MG TABS tablet Take 10 mg by mouth daily.     ferrous sulfate (IRON SUPPLEMENT) 325 (65 FE) MG tablet Take 1 tablet (325 mg total) by mouth 2 (two) times daily with a meal. 60 tablet 2   Lancets (ONETOUCH ULTRASOFT) lancets CHECK TWICE DAILY BEFORE  BREAKFAST AND DINNER 200 each 2   losartan (COZAAR) 50 MG tablet TAKE 1 TABLET BY MOUTH  DAILY 90 tablet 3   LUMIGAN 0.01 % SOLN Place 1 drop into both eyes every evening.     methocarbamol (ROBAXIN) 500 MG tablet  Take 1 tablet (500 mg total) by mouth 2 (two) times daily as needed. To be taken after surgery 20 tablet 2   nitroGLYCERIN (NITROSTAT) 0.4 MG SL tablet Place 1 tablet (0.4 mg total) under the tongue every 5 (five) minutes as needed for chest pain. 25 tablet 4   ONETOUCH ULTRA test strip USE TO TEST BLOOD SUGARS  TWICE DAILY 200 strip 4   Semaglutide (RYBELSUS) 14 MG TABS Take 1 tablet (14 mg total) by mouth daily. 90 tablet 3   No current facility-administered medications on file prior to visit.    Allergies  Allergen Reactions   Lisinopril Rash   Metformin And Related Swelling    Assessment/Plan:  1. Hyperlipidemia - Patient LDL 122 which is above goal of <55. Aggressive goal due to elevated coronary calcium score and T2DM. Patient can tolerate atorvastatin 3 times weekly, will continue.  Recommend addition of PCSK9i.  Using demo pen, educated patient on mechanism of action, storage, site selection, and administration. Patient able to demonstrate  in room. Will complete PA and patient requests patient assistance application be faxed. Will complete. Has follow up with Dr Harrell Gave in 1 week. Recheck lipid panel in 2-3 months.  Continue atorvastatin 79m three times weekly Start Repatha 1499mevery 2 weeks Follow up in 1 week with Dr ChTrinda PascalPharmD, [id number]: [phone]: [phone]

## 2022-08-07 ENCOUNTER — Ambulatory Visit (INDEPENDENT_AMBULATORY_CARE_PROVIDER_SITE_OTHER): Payer: Medicare Other | Admitting: *Deleted

## 2022-08-07 DIAGNOSIS — Z23 Encounter for immunization: Secondary | ICD-10-CM | POA: Diagnosis not present

## 2022-08-08 ENCOUNTER — Ambulatory Visit (INDEPENDENT_AMBULATORY_CARE_PROVIDER_SITE_OTHER): Payer: Medicare Other

## 2022-08-08 ENCOUNTER — Encounter: Payer: Self-pay | Admitting: Orthopaedic Surgery

## 2022-08-08 ENCOUNTER — Ambulatory Visit: Payer: Medicare Other | Admitting: Orthopaedic Surgery

## 2022-08-08 DIAGNOSIS — Z96641 Presence of right artificial hip joint: Secondary | ICD-10-CM | POA: Diagnosis not present

## 2022-08-08 NOTE — Progress Notes (Signed)
Post-Op Visit Note   Patient: Catherine Mcconnell           Date of Birth: 27-Nov-1945           MRN: [id number] Visit Date: 08/08/2022 PCP: Inda Coke, PA   Assessment & Plan:  Chief Complaint:  Chief Complaint  Patient presents with   Right Hip - Follow-up    Right total hip arthroplasty 08/06/2021   Visit Diagnoses:  1. History of total hip replacement, right     Plan: Patient is a pleasant 76 year old female who comes in today 1 year status post right total hip replacement 08/06/2021.  She has been doing well.  She notes occasional irritation when lying on her right side but nothing more.  Examination of her right hip reveals painless hip flexion and logroll.  She is neurovascular intact distally.  At this point, she will continue to advance with activity as tolerated.  Dental prophylaxis reinforced for another year.  Follow-up in 1 to 2 years for repeat evaluation and AP pelvis x-rays.  Call with concerns or questions.  Follow-Up Instructions: Return if symptoms worsen or fail to improve.   Orders:  Orders Placed This Encounter  Procedures   XR Pelvis 1-2 Views   No orders of the defined types were placed in this encounter.   Imaging: No results found.  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   Preop cardiovascular exam 06/04/2021   Avascular necrosis of bone of right hip ([place]) 05/02/2021   Type 2 diabetes mellitus with hyperglycemia, without long-term current use of insulin ([place] [facility]) 09/11/2020   Type 2 diabetes mellitus with diabetic polyneuropathy, without long-term current use of insulin ([place]) 09/11/2020   Type 2 diabetes mellitus with stage 3a chronic kidney disease, without long-term current use of insulin ([place]) 09/11/2020   Type 2 diabetes mellitus with hyperglycemia, with long-term current use of insulin ([place]) 06/06/2020   Memory deficits  03/16/2020   Diabetes mellitus without complication ([place]) 25/95/6387   OSA on CPAP    Chronic kidney disease    Essential hypertension    Mixed hyperlipidemia    Past Medical History:  Diagnosis Date   Anemia    on meds   Arthritis    hips/wrists/back   Cataract    sx-bilateral   Chronic kidney disease    stage 3   Diabetes mellitus without complication ([place])    on meds   Hypertension    on meds   Iron deficiency    on oral iron supplements, has not required transfusion   Mixed hyperlipidemia    on meds   OSA on CPAP    compliant   Sleep apnea    uses CPAP   Thyroid nodule    removed-not on meds at this time (12/01/2020)    Family History  Problem Relation Age of Onset   Stroke Mother    Hypertension Mother    Heart disease Mother    Heart attack Father    Early death Father    Hypertension Father    Diabetes Sister    Early death Sister    Hypertension Sister    Stroke Sister    Diabetes Sister    Early death Sister    Hyperlipidemia Sister    Kidney disease Sister    Diabetes Sister    Hyperlipidemia Sister    Hypertension Sister  Diabetes Sister    Early death Brother    Arthritis Brother    Heart attack Brother    Heart disease Brother    Arthritis Brother    Memory loss Paternal Uncle    Depression Daughter    Hypertension Daughter    Hyperlipidemia Daughter    Prostate cancer Son    Cancer Son    Early death Son    Colon polyps Neg Hx    Colon cancer Neg Hx    Esophageal cancer Neg Hx    Rectal cancer Neg Hx    Stomach cancer Neg Hx    Sleep apnea Neg Hx     Past Surgical History:  Procedure Laterality Date   ABDOMINAL HYSTERECTOMY  1996   MASS EXCISION Right 2017   right arm mass removal   THYROID SURGERY     left side removal benign nodule   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   Procedure: RIGHT TOTAL HIP ARTHROPLASTY ANTERIOR APPROACH;  Surgeon: Leandrew Koyanagi, MD;  Location: [facility];  Service: Orthopedics;  Laterality: Right;   3-C   Social History   Occupational History   Occupation: Retired   Tobacco Use   Smoking status: Former    Packs/day: 0.25    Years: 1.00    Total pack years: 0.25    Types: Cigarettes    Quit date: 1960    Years since quitting: 63.8   Smokeless tobacco: Never   Tobacco comments:    only socially in high school  Vaping Use   Vaping Use: Never used  Substance and Sexual Activity   Alcohol use: Not Currently   Drug use: Never   Sexual activity: Not on file

## 2022-08-12 ENCOUNTER — Encounter (HOSPITAL_BASED_OUTPATIENT_CLINIC_OR_DEPARTMENT_OTHER): Payer: Self-pay | Admitting: Cardiology

## 2022-08-12 ENCOUNTER — Ambulatory Visit (HOSPITAL_BASED_OUTPATIENT_CLINIC_OR_DEPARTMENT_OTHER): Payer: Medicare Other | Admitting: Cardiology

## 2022-08-12 VITALS — BP 134/70 | HR 59 | Ht 66.0 in | Wt 144.7 lb

## 2022-08-12 DIAGNOSIS — Z794 Long term (current) use of insulin: Secondary | ICD-10-CM

## 2022-08-12 DIAGNOSIS — I2510 Atherosclerotic heart disease of native coronary artery without angina pectoris: Secondary | ICD-10-CM

## 2022-08-12 DIAGNOSIS — E782 Mixed hyperlipidemia: Secondary | ICD-10-CM | POA: Diagnosis not present

## 2022-08-12 DIAGNOSIS — E1122 Type 2 diabetes mellitus with diabetic chronic kidney disease: Secondary | ICD-10-CM | POA: Diagnosis not present

## 2022-08-12 DIAGNOSIS — Z712 Person consulting for explanation of examination or test findings: Secondary | ICD-10-CM

## 2022-08-12 DIAGNOSIS — N1832 Chronic kidney disease, stage 3b: Secondary | ICD-10-CM

## 2022-08-12 DIAGNOSIS — I10 Essential (primary) hypertension: Secondary | ICD-10-CM | POA: Diagnosis not present

## 2022-08-12 NOTE — Patient Instructions (Signed)
Medication Instructions:  Your Physician recommend you continue on your current medication as directed.    *If you need a refill on your cardiac medications before your next appointment, please call your pharmacy*   Lab Work: None ordered today   Testing/Procedures: Your physician has requested that you have an exercise tolerance test. For further information please visit [email or web address]. Please also follow instruction sheet, as given. [place] [address]   Follow-Up: At [facility], you and your health needs are our priority.  As part of our continuing mission to provide you with exceptional heart care, we have created designated Provider Care Teams.  These Care Teams include your primary Cardiologist (physician) and Advanced Practice Providers (APPs -  Physician Assistants and Nurse Practitioners) who all work together to provide you with the care you need, when you need it.  We recommend signing up for the patient portal called "MyChart".  Sign up information is provided on this After Visit Summary.  MyChart is used to connect with patients for Virtual Visits (Telemedicine).  Patients are able to view lab/test results, encounter notes, upcoming appointments, etc.  Non-urgent messages can be sent to your provider as well.   To learn more about what you can do with MyChart, go to [email or web address].    Your next appointment:   3 month(s)  The format for your next appointment:   In Person  Provider:   Buford Dresser, MD      [facility] [address] [place], [place] [address] Phone:  [phone]        You are scheduled for an Exercise Stress Test  Please arrive 15 minutes prior to your appointment time for registration and insurance purposes.  The test will take approximately 45 minutes to complete.  How to prepare for your Exercise Stress Test: Do bring a list of your  current medications with you.  If not listed below, you may take your medications as normal. Do wear comfortable clothes (no dresses or overalls) and walking shoes, tennis shoes preferred (no heels or open toed shoes are allowed) Do Not wear cologne, perfume, aftershave or lotions (deodorant is allowed). Please report to [address], [address] for your test.  If these instructions are not followed, your test will have to be rescheduled.  If you have questions or concerns about your appointment, you can call the Stress Lab at [phone].  If you cannot keep your appointment, please provide 24 hours notification to the Stress Lab, to avoid a possible $50 charge to your account

## 2022-08-12 NOTE — Progress Notes (Signed)
Cardiology Office Note:    Date:  08/12/2022   ID:  Catherine Mcconnell, DOB [birth year], MRN [id number]  PCP:  Catherine Coke, PA  Cardiologist:  Catherine Dresser, MD  Referring MD: Catherine Mcconnell, [place]   CC: follow up  History of Present Illness:    Catherine Mcconnell is a 76 y.o. female with a hx of hypertension, OSA on CPAP, type II diabetes, chronic kidney disease stage 3a, mixed hyperlipidemia, who is seen for follow up. Previously seen as a new consult at the request of Catherine Mcconnell, [place] for the evaluation and management of hypertension.  She was accompanied by her daughter today, who is also my patient. She is a retired Sports administrator.   Cardiac history: hypertension "for decades." Hypercholesterolemia, but does not tolerate daily statin due to myalgia. Type II diabetes for >20 years. Chronic kidney disease, follows with Dr. Hollie Salk. Calcium score very elevated but asymptomatic. Pending PCSK9i.  Today:  She is accompanied by her daughter. She says she has been doing well. We reviewed her PharmD visit and her calcium score results. She has been active without symptoms. Discussed options for further evaluation.  Her blood pressure in clinic is 150/72. Her daughter reports that she has not taken her blood pressure medication yet today. Upon recheck, her blood pressure is 134/70.   She denies any palpitations, chest pain, shortness of breath, or peripheral edema. No lightheadedness, headaches, syncope, orthopnea, or PND.  Past Medical History:  Diagnosis Date   Anemia    on meds   Arthritis    hips/wrists/back   Cataract    sx-bilateral   Chronic kidney disease    stage 3   Diabetes mellitus without complication (HCC)    on meds   Hypertension    on meds   Iron deficiency    on oral iron supplements, has not required transfusion   Mixed hyperlipidemia    on meds   OSA on CPAP    compliant   Sleep apnea    uses CPAP   Thyroid nodule    removed-not on meds at this  time (12/01/2020)    Past Surgical History:  Procedure Laterality Date   ABDOMINAL HYSTERECTOMY  1996   MASS EXCISION Right 2017   right arm mass removal   THYROID SURGERY     left side removal benign nodule   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   Procedure: RIGHT TOTAL HIP ARTHROPLASTY ANTERIOR APPROACH;  Surgeon: Leandrew Koyanagi, MD;  Location: [facility];  Service: Orthopedics;  Laterality: Right;  3-C    Current Medications: Current Outpatient Medications on File Prior to Visit  Medication Sig   amLODipine (NORVASC) 10 MG tablet TAKE 1 TABLET BY MOUTH  DAILY   aspirin EC 81 MG tablet Take 1 tablet (81 mg total) by mouth daily. Swallow whole.   atorvastatin (LIPITOR) 80 MG tablet Take 1 tablet by mouth every Monday, Wednesday, and Friday.   Blood Glucose Monitoring Suppl (ONE TOUCH ULTRA MINI) w/Device KIT Use to check blood sugars twice a day.   carvedilol (COREG) 6.25 MG tablet TAKE 1 TABLET BY MOUTH  TWICE DAILY WITH A MEAL   donepezil (ARICEPT ODT) 10 MG disintegrating tablet DISSOLVE 1 TABLET ON THE  TONGUE AT BEDTIME   FARXIGA 10 MG TABS tablet Take 10 mg by mouth daily.   ferrous sulfate (IRON SUPPLEMENT) 325 (65 FE) MG tablet Take 1 tablet (325 mg total) by mouth 2 (two) times daily with a meal.   Lancets (  ONETOUCH ULTRASOFT) lancets CHECK TWICE DAILY BEFORE  BREAKFAST AND DINNER   losartan (COZAAR) 50 MG tablet TAKE 1 TABLET BY MOUTH  DAILY   LUMIGAN 0.01 % SOLN Place 1 drop into both eyes every evening.   methocarbamol (ROBAXIN) 500 MG tablet Take 1 tablet (500 mg total) by mouth 2 (two) times daily as needed. To be taken after surgery   nitroGLYCERIN (NITROSTAT) 0.4 MG SL tablet Place 1 tablet (0.4 mg total) under the tongue every 5 (five) minutes as needed for chest pain.   ONETOUCH ULTRA test strip USE TO TEST BLOOD SUGARS  TWICE DAILY   Semaglutide (RYBELSUS) 14 MG TABS Take 1 tablet (14 mg total) by mouth daily.   No current facility-administered medications on file  prior to visit.     Allergies:   Lisinopril and Metformin and related   Social History   Tobacco Use   Smoking status: Former    Packs/day: 0.25    Years: 1.00    Total pack years: 0.25    Types: Cigarettes    Quit date: 1960    Years since quitting: 63.8   Smokeless tobacco: Never   Tobacco comments:    only socially in high school  Vaping Use   Vaping Use: Never used  Substance Use Topics   Alcohol use: Not Currently   Drug use: Never    Family History: family history includes Arthritis in her brother and brother; Cancer in her son; Depression in her daughter; Diabetes in her sister, sister, sister, and sister; Early death in her brother, father, sister, sister, and son; Heart attack in her brother and father; Heart disease in her brother and mother; Hyperlipidemia in her daughter, sister, and sister; Hypertension in her daughter, father, mother, sister, and sister; Kidney disease in her sister; Memory loss in her paternal uncle; Prostate cancer in her son; Stroke in her mother and sister. There is no history of Colon polyps, Colon cancer, Esophageal cancer, Rectal cancer, Stomach cancer, or Sleep apnea.  ROS:   Please see the history of present illness.  Additional pertinent ROS otherwise unremarkable.  EKGs/Labs/Other Studies Reviewed:    The following studies were reviewed today:  Calcium score 07/12/22: Coronary Calcium Score:  Left main: 71.3  Left anterior descending artery: 2580  Left circumflex artery: 0  Right coronary artery: 302   Total: 2953  Percentile: 99th   Aorta: Borderline dilated to 39 mm at the level of the main PA bifurcation. Aortic atherosclerosis.  Right LE Venous Doppler  02/16/2022: IMPRESSION: No evidence of RIGHT LOWER extremity DVT.  Echocardiogram  06/12/2021: Left ventricle cavity is normal in size. Mild concentric hypertrophy of  the left ventricle. Normal global wall motion. Normal LV systolic function  with EF 60%. Doppler  evidence of grade I (impaired) diastolic dysfunction,  normal LAP.  Structurally normal trileaflet aortic valve. Mild (Grade I) aortic  regurgitation.  No evidence of pulmonary hypertension.  EKG:  EKG is personally reviewed.   08/12/22: not ordered today 05/30/2022:  sinus bradycardia at 57 bpm, borderline LVH  Recent Labs: 02/20/2022: ALT 20; BUN 33; Creatinine 1.8; Hemoglobin 13.0; Platelets 222; Potassium 3.9; Sodium 138   Recent Lipid Panel    Component Value Date/Time   CHOL 201 (H) 06/06/2022 0824   TRIG 71 06/06/2022 0824   HDL 66 06/06/2022 0824   CHOLHDL 3.0 06/06/2022 0824   CHOLHDL 3.6 06/06/2020 1113   VLDL 17.2 08/13/2018 0916   LDLCALC 122 (H) 06/06/2022 0824   LDLCALC  134 (H) 06/06/2020 1113    Physical Exam:    VS:  BP 134/70 (BP Location: Right Arm, Patient Position: Sitting, Cuff Size: Normal)   Pulse (!) 59   Ht _0  (1.676 m)   Wt 144 lb 11.2 oz (65.6 kg)   SpO2 98%   BMI 23.36 kg/m     Wt Readings from Last 3 Encounters:  08/12/22 144 lb 11.2 oz (65.6 kg)  07/11/22 152 lb 6.4 oz (69.1 kg)  05/30/22 153 lb 4.8 oz (69.5 kg)    GEN: Well nourished, well developed in no acute distress HEENT: Normal, moist mucous membranes NECK: No JVD CARDIAC: regular rhythm, normal S1 and S2, no rubs or gallops. 1/6 systolic murmur. VASCULAR: Radial and DP pulses 2+ bilaterally. No carotid bruits RESPIRATORY:  Clear to auscultation without rales, wheezing or rhonchi  ABDOMEN: Soft, non-tender, non-distended MUSCULOSKELETAL:  Ambulates independently SKIN: Warm and dry, no edema NEUROLOGIC:  Alert and oriented x 3. No focal neuro deficits noted. PSYCHIATRIC:  Normal affect    ASSESSMENT:    1. Nonocclusive coronary atherosclerosis of native coronary artery   2. Mixed hyperlipidemia   3. Essential hypertension   4. Type 2 diabetes mellitus with stage 3b chronic kidney disease, with long-term current use of insulin ([place])   5. Encounter to discuss test results      PLAN:    Hypertension -near goal on recheck despite not having taken AM meds yet -continue amlodipine, carvedilol, losartan  Coronary calcification, consistent with nonobstructive CAD (cannot exclude obstructive) Mixed hyperlipidemia -continue atorvastatin 80 mg three times weekly. Did not tolerate daily dosing due to myalgia. -aspirin stopped previously but restarted after calcium score -last LDL 25, almost a year old. Recheck ordered -we reviewed her calcium score today. She has already met with our lipid clinic and is pending start of [place].  -she denies symptoms with her normal activities, but with her severely elevated score, we discussed treadmill stress test to exclude ischemia. She is amenable. The concern would be major ECG changes (has minor baseline abnormalities but test should still be interpretable).  Shared Decision Making/Informed Consent The risks [chest pain, shortness of breath, cardiac arrhythmias, dizziness, blood pressure fluctuations, myocardial infarction, stroke/transient ischemic attack, and life-threatening complications (estimated to be 1 in 10,000)], benefits (risk stratification, diagnosing coronary artery disease, treatment guidance) and alternatives of an exercise tolerance test were discussed in detail with Ms. Wisnewski and she agrees to proceed.   Chronic kidney disease stage 3b -followed by Dr. Hollie Salk at [facility] -on losartan, farxiga  Type II diabetes -on farxiga, rybelsus, and insulin  Cardiac risk counseling and prevention recommendations: -recommend heart healthy/Mediterranean diet, with whole grains, fruits, vegetable, fish, lean meats, nuts, and olive oil. Limit salt. -recommend moderate walking, 3-5 times/week for 30-50 minutes each session. Aim for at least 150 minutes.week. Goal should be pace of 3 miles/hours, or walking 1.5 miles in 30 minutes -recommend avoidance of tobacco products. Avoid excess alcohol.  Plan for follow up: 3  months unless treadmill testing very abnormal  Catherine Dresser, MD, PhD, [facility]    Medication Adjustments/Labs and Tests Ordered: Current medicines are reviewed at length with the patient today.  Concerns regarding medicines are outlined above.   Orders Placed This Encounter  Procedures   Cardiac Stress Test: Informed Consent Details: Physician/Practitioner Attestation; Transcribe to consent form and obtain patient signature   EXERCISE TOLERANCE TEST (ETT)   No orders of the defined types were placed in  this encounter.  Patient Instructions  Medication Instructions:  Your Physician recommend you continue on your current medication as directed.    *If you need a refill on your cardiac medications before your next appointment, please call your pharmacy*   Lab Work: None ordered today   Testing/Procedures: Your physician has requested that you have an exercise tolerance test. For further information please visit [email or web address]. Please also follow instruction sheet, as given. Echo 300   Follow-Up: At [facility], you and your health needs are our priority.  As part of our continuing mission to provide you with exceptional heart care, we have created designated Provider Care Teams.  These Care Teams include your primary Cardiologist (physician) and Advanced Practice Providers (APPs -  Physician Assistants and Nurse Practitioners) who all work together to provide you with the care you need, when you need it.  We recommend signing up for the patient portal called "MyChart".  Sign up information is provided on this After Visit Summary.  MyChart is used to connect with patients for Virtual Visits (Telemedicine).  Patients are able to view lab/test results, encounter notes, upcoming appointments, etc.  Non-urgent messages can be sent to your provider as well.   To learn more about what you can do with MyChart, go to  [email or web address].    Your next appointment:   3 month(s)  The format for your next appointment:   In Person  Provider:   Buford Dresser, MD      [facility] [address], [place], [place] [address] Phone:  [phone]        You are scheduled for an Exercise Stress Test  Please arrive 15 minutes prior to your appointment time for registration and insurance purposes.  The test will take approximately 45 minutes to complete.  How to prepare for your Exercise Stress Test: Do bring a list of your current medications with you.  If not listed below, you may take your medications as normal. Do wear comfortable clothes (no dresses or overalls) and walking shoes, tennis shoes preferred (no heels or open toed shoes are allowed) Do Not wear cologne, perfume, aftershave or lotions (deodorant is allowed). Please report to [place], [address] for your test.  If these instructions are not followed, your test will have to be rescheduled.  If you have questions or concerns about your appointment, you can call the Stress Lab at [phone].  If you cannot keep your appointment, please provide 24 hours notification to the Stress Lab, to avoid a possible $50 charge to your account       I,Breanna Adamick,acting as a scribe for Catherine Dresser, MD.,have documented all relevant documentation on the behalf of Catherine Dresser, MD,as directed by  Catherine Dresser, MD while in the presence of Catherine Dresser, MD.   I, Catherine Dresser, MD, have reviewed all documentation for this visit. The documentation on 08/12/22 for the exam, diagnosis, procedures, and orders are all accurate and complete.   Signed, Catherine Dresser, MD PhD 08/12/2022     [facility]

## 2022-08-21 ENCOUNTER — Telehealth (HOSPITAL_COMMUNITY): Payer: Self-pay | Admitting: *Deleted

## 2022-08-21 LAB — BASIC METABOLIC PANEL
BUN: 21 (ref 4–21)
CO2: 23 — AB (ref 13–22)
Chloride: 103 (ref 99–108)
Creatinine: 1.7 — AB (ref 0.5–1.1)
Glucose: 142
Potassium: 3.9 mEq/L (ref 3.5–5.1)
Sodium: 140 (ref 137–147)

## 2022-08-21 LAB — HEPATIC FUNCTION PANEL: ALT: 17 U/L (ref 7–35)

## 2022-08-21 NOTE — Telephone Encounter (Signed)
Patient given detailed instructions per Stress Test Requisition Sheet for test on 08/26/2022 at 3:30.Patient Notified to arrive 30 minutes early, and that it is imperative to arrive on time for appointment to keep from having the test rescheduled.  Patient verbalized understanding. Catherine Mcconnell

## 2022-08-23 LAB — COMPREHENSIVE METABOLIC PANEL
Albumin: 4.2 (ref 3.5–5.0)
Calcium: 9.5 (ref 8.7–10.7)
Globulin: 2
eGFR: 32

## 2022-08-26 ENCOUNTER — Ambulatory Visit (HOSPITAL_COMMUNITY): Payer: Medicare Other | Attending: Cardiology

## 2022-08-26 DIAGNOSIS — I2510 Atherosclerotic heart disease of native coronary artery without angina pectoris: Secondary | ICD-10-CM | POA: Diagnosis present

## 2022-08-26 LAB — EXERCISE TOLERANCE TEST
Angina Index: 0
Duke Treadmill Score: 5
Estimated workload: 7
Exercise duration (min): 5 min
Exercise duration (sec): 1 s
MPHR: 144 {beats}/min
Peak HR: 157 {beats}/min
Percent HR: 109 %
Rest HR: 68 {beats}/min
ST Depression (mm): 0 mm

## 2022-09-03 ENCOUNTER — Other Ambulatory Visit: Payer: Self-pay | Admitting: Adult Health

## 2022-09-03 DIAGNOSIS — G4733 Obstructive sleep apnea (adult) (pediatric): Secondary | ICD-10-CM

## 2022-09-03 NOTE — Progress Notes (Signed)
Daughter states that current DME is not responsive with supplies. Wants to switch to another DME company

## 2022-09-12 ENCOUNTER — Telehealth: Payer: Self-pay | Admitting: *Deleted

## 2022-09-16 NOTE — Telephone Encounter (Signed)
New, Willodean Rosenthal, RN; Redmond Pulling, Jake Shark Received, Thank you!     Previous Messages    ----- Message ----- From: Brandon Melnick, RN Sent: 09/12/2022   1:35 PM EST To: Darlina Guys; Miquel Dunn; Nash Shearer; * Subject: TOC  from  [facility]. Needs mask refi*  Good Afternoon,  Needing to switch DME for supplies.  Daughter is contact,  [place] [place].  They came to [place] from [facility],  [place] [facility].    Needs new supplies.  Mask fitting.    Order in chart.  09-03-2022  Thanks  Catherine Mcconnell Dec 23, 1945 MRN [id number]  Thanks  [name] RN

## 2022-09-18 ENCOUNTER — Encounter: Payer: Self-pay | Admitting: Pharmacist

## 2022-09-18 ENCOUNTER — Telehealth (HOSPITAL_BASED_OUTPATIENT_CLINIC_OR_DEPARTMENT_OTHER): Payer: Self-pay

## 2022-09-18 ENCOUNTER — Telehealth: Payer: Self-pay | Admitting: Physician Assistant

## 2022-09-18 MED ORDER — NITROGLYCERIN 0.4 MG SL SUBL: 0.4000 mg | SUBLINGUAL_TABLET | SUBLINGUAL | 4 refills | Status: DC | PRN

## 2022-09-18 NOTE — Telephone Encounter (Signed)
New, Willodean Rosenthal, RN; Gaye Pollack; Minus Liberty Hello,  We have an order on file for mask fit / supplies. Its with the re-supply team for processing.  Thank you,  Brad New     Previous Messages    ----- Message ----- From: Brandon Melnick, RN Sent: 09/18/2022   2:03 PM EST To: Darlina Guys; Miquel Dunn; Nash Shearer; * Subject: mask refitting and supplies                    Hey did you all assist this pt.  Catherine P. [facility] Female, 76 y.o., 1946-06-02 Pronouns: she/her/hers MRN: [id number]   Im just following up.  Newman Pies

## 2022-09-18 NOTE — Telephone Encounter (Signed)
    Patient Name: Catherine Mcconnell Gender: Female DOB: [birth year] Age: 76 Y 2 M 4 D Return Phone Number: [phone] (Primary) Address: City/ State/ Zip: [place]  [address] Client [place] [facility] [place] Client Site [place] [facility] [place] [facility] Provider Morene Rankins, [place]- PA Contact Type Call Who Is Calling Patient / Member / Family / Caregiver Call Type Triage / Clinical Relationship To Patient Self Return Phone Number [phone] (Primary) Chief Complaint Eye Pain Reason for Call Symptomatic / Request for [name] states her eye is swelling. She has been having eye pain. Translation No Nurse Assessment Nurse: Fredderick Phenix, RN, Lelan Pons Date/Time Eilene Ghazi Time): 09/18/2022 10:18:07 AM Confirm and document reason for call. If symptomatic, describe symptoms. ---Caller states her left eye is swelling. Top and bottom lid are swollen. Not red. She has been having eye pain. Does the patient have any new or worsening symptoms? ---Yes Will a triage be completed? ---Yes Related visit to physician within the last 2 weeks? ---No Does the PT have any chronic conditions? (i.e. diabetes, asthma, this includes High risk factors for pregnancy, etc.) ---Yes List chronic conditions. ---diabetes HTN, CKD Is this a behavioral health or substance abuse call? ---No Guidelines Guideline Title Affirmed Question Affirmed Notes Nurse Date/Time (Eastern Time) Eye - Swelling [1] SEVERE eyelid swelling on one side AND [2] red and painful (or tender to touch) Fredderick Phenix, RN, Lelan Pons 09/18/2022 10:20:22 AM  Disp. Time Eilene Ghazi Time) Disposition Final User 09/18/2022 10:25:59 AM See HCP within 4 Hours (or PCP triage) Yes Fredderick Phenix, RN, Lelan Pons Final Disposition 09/18/2022 10:25:59 AM See HCP within 4 Hours (or PCP triage) Yes Fredderick Phenix, RN, [place] Disagree/Comply Comply Caller Understands Yes PreDisposition Did not know what  to do Care Advice Given Per Guideline SEE HCP (OR PCP TRIAGE) WITHIN 4 HOURS: * IF OFFICE WILL BE OPEN: You need to be seen within the next 3 or 4 hours. Call your doctor (or NP/PA) now or as soon as the office opens. CARE ADVICE given per Eye - Swelling (Adult) guideline. CALL BACK IF: * You become worse Referrals  Urgent [place] at [place]

## 2022-09-18 NOTE — Telephone Encounter (Signed)
Received fax from [facility] requesting refills for Nitroglycerin and Repatha. Rx request for [facility] sent to pharmacy. Routing to PharmD concerning Repatha. This is not on pt list and looks like the pt daughter sent MyChart message today as well.

## 2022-09-18 NOTE — Telephone Encounter (Signed)
See mychart message for more details

## 2022-09-18 NOTE — Telephone Encounter (Signed)
Caller States: -pt's eye pain has worsened. -09/18/22 AM : Patient's eye has started to swell up.   Warm transferred to [facility], patient coordinator with PCP Triage / Team Health Nurse. Waiting for follow up notes.

## 2022-09-19 ENCOUNTER — Encounter: Payer: Self-pay | Admitting: Physician Assistant

## 2022-09-19 ENCOUNTER — Ambulatory Visit (INDEPENDENT_AMBULATORY_CARE_PROVIDER_SITE_OTHER): Payer: Medicare Other | Admitting: Physician Assistant

## 2022-09-19 VITALS — BP 136/76 | HR 61 | Temp 97.8°F | Resp 15 | Wt 140.4 lb

## 2022-09-19 DIAGNOSIS — E119 Type 2 diabetes mellitus without complications: Secondary | ICD-10-CM

## 2022-09-19 DIAGNOSIS — H5711 Ocular pain, right eye: Secondary | ICD-10-CM | POA: Diagnosis not present

## 2022-09-19 MED ORDER — RYBELSUS 7 MG PO TABS: 7.0000 mg | ORAL_TABLET | Freq: Every day | ORAL | 0 refills | Status: DC

## 2022-09-19 MED ORDER — ERYTHROMYCIN 5 MG/GM OP OINT: TOPICAL_OINTMENT | OPHTHALMIC | 0 refills | Status: DC

## 2022-09-19 NOTE — Progress Notes (Signed)
Catherine Mcconnell is a 76 y.o. female here for a follow up of a pre-existing problem.  History of Present Illness:   Chief Complaint  Patient presents with   Conjunctivitis    Right eye pain x 2 weeks with drainage  Denies any vision changes  Ophthalmology sent referral to [facility], but cannot been seen til Feb     HPI  Conjunctivitis  Patient complains of right eye pain that onset x2 weeks ago accompanied with some drainage intermittently. She has some eye puffiness and swelling, and a needling feeling at lower lid of her eyeball.  Uses OTC eyedrops but found no relief.  Denies in changes in vision, headaches, numbness or tingling.    Diabetes 4 month follow-up. Current DM meds: Rybelsus 14 mg daily, Farxiga 10 mg daily. Patient is compliant with medications. Denies: hypoglycemic or hyperglycemic episodes or symptoms. Her appetite is limited and she is losing weight.  Lab Results  Component Value Date   HGBA1C 6.3 (A) 07/11/2022      Past Medical History:  Diagnosis Date   Anemia    on meds   Arthritis    hips/wrists/back   Cataract    sx-bilateral   Chronic kidney disease    stage 3   Diabetes mellitus without complication (HCC)    on meds   Hypertension    on meds   Iron deficiency    on oral iron supplements, has not required transfusion   Mixed hyperlipidemia    on meds   OSA on CPAP    compliant   Sleep apnea    uses CPAP   Thyroid nodule    removed-not on meds at this time (12/01/2020)     Social History   Tobacco Use   Smoking status: Former    Packs/day: 0.25    Years: 1.00    Total pack years: 0.25    Types: Cigarettes    Quit date: 1960    Years since quitting: 63.9   Smokeless tobacco: Never   Tobacco comments:    only socially in high school  Vaping Use   Vaping Use: Never used  Substance Use Topics   Alcohol use: Not Currently   Drug use: Never    Past Surgical History:  Procedure Laterality Date   ABDOMINAL HYSTERECTOMY   1996   MASS EXCISION Right 2017   right arm mass removal   THYROID SURGERY     left side removal benign nodule   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   Procedure: RIGHT TOTAL HIP ARTHROPLASTY ANTERIOR APPROACH;  Surgeon: Leandrew Koyanagi, MD;  Location: [place];  Service: Orthopedics;  Laterality: Right;  3-C    Family History  Problem Relation Age of Onset   Stroke Mother    Hypertension Mother    Heart disease Mother    Heart attack Father    Early death Father    Hypertension Father    Diabetes Sister    Early death Sister    Hypertension Sister    Stroke Sister    Diabetes Sister    Early death Sister    Hyperlipidemia Sister    Kidney disease Sister    Diabetes Sister    Hyperlipidemia Sister    Hypertension Sister    Diabetes Sister    Early death Brother    Arthritis Brother    Heart attack Brother    Heart disease Brother    Arthritis Brother    Memory loss Paternal Interior and spatial designer  Depression Daughter    Hypertension Daughter    Hyperlipidemia Daughter    Prostate cancer Son    Cancer Son    Early death Son    Colon polyps Neg Hx    Colon cancer Neg Hx    Esophageal cancer Neg Hx    Rectal cancer Neg Hx    Stomach cancer Neg Hx    Sleep apnea Neg Hx     Allergies  Allergen Reactions   Lisinopril Rash   Metformin And Related Swelling    Current Medications:   Current Outpatient Medications:    amLODipine (NORVASC) 10 MG tablet, TAKE 1 TABLET BY MOUTH  DAILY, Disp: 90 tablet, Rfl: 3   aspirin EC 81 MG tablet, Take 1 tablet (81 mg total) by mouth daily. Swallow whole., Disp: 90 tablet, Rfl: 3   atorvastatin (LIPITOR) 80 MG tablet, Take 1 tablet by mouth every Monday, Wednesday, and Friday., Disp: 90 tablet, Rfl: 3   Blood Glucose Monitoring Suppl (ONE TOUCH ULTRA MINI) w/Device KIT, Use to check blood sugars twice a day., Disp: 1 kit, Rfl: 0   carvedilol (COREG) 6.25 MG tablet, TAKE 1 TABLET BY MOUTH  TWICE DAILY WITH A MEAL, Disp: 180 tablet, Rfl: 3    donepezil (ARICEPT ODT) 10 MG disintegrating tablet, DISSOLVE 1 TABLET ON THE  TONGUE AT BEDTIME, Disp: 90 tablet, Rfl: 3   erythromycin ophthalmic ointment, Apply nightly for 3-5 days, Disp: 3.5 g, Rfl: 0   FARXIGA 10 MG TABS tablet, Take 10 mg by mouth daily., Disp: , Rfl:    ferrous sulfate (IRON SUPPLEMENT) 325 (65 FE) MG tablet, Take 1 tablet (325 mg total) by mouth 2 (two) times daily with a meal., Disp: 60 tablet, Rfl: 2   Lancets (ONETOUCH ULTRASOFT) lancets, CHECK TWICE DAILY BEFORE  BREAKFAST AND DINNER, Disp: 200 each, Rfl: 2   losartan (COZAAR) 50 MG tablet, TAKE 1 TABLET BY MOUTH  DAILY, Disp: 90 tablet, Rfl: 3   LUMIGAN 0.01 % SOLN, Place 1 drop into both eyes every evening., Disp: , Rfl:    nitroGLYCERIN (NITROSTAT) 0.4 MG SL tablet, Place 1 tablet (0.4 mg total) under the tongue every 5 (five) minutes as needed for chest pain., Disp: 25 tablet, Rfl: 4   ONETOUCH ULTRA test strip, USE TO TEST BLOOD SUGARS  TWICE DAILY, Disp: 200 strip, Rfl: 4   Semaglutide (RYBELSUS) 7 MG TABS, Take 7 mg by mouth daily., Disp: 90 tablet, Rfl: 0   methocarbamol (ROBAXIN) 500 MG tablet, Take 1 tablet (500 mg total) by mouth 2 (two) times daily as needed. To be taken after surgery (Patient not taking: Reported on 09/19/2022), Disp: 20 tablet, Rfl: 2   Review of Systems:   Review of Systems  Constitutional:  Negative for chills, fever, malaise/fatigue and weight loss.  HENT:  Negative for hearing loss, sinus pain and sore throat.   Eyes:  Positive for pain and discharge.  Respiratory:  Negative for cough and hemoptysis.   Cardiovascular:  Negative for chest pain, palpitations, leg swelling and PND.  Gastrointestinal:  Negative for abdominal pain, constipation, diarrhea, heartburn, nausea and vomiting.  Genitourinary:  Negative for dysuria, frequency and urgency.  Musculoskeletal:  Negative for back pain, myalgias and neck pain.  Skin:  Negative for itching and rash.  Neurological:  Negative for  dizziness, tingling, seizures and headaches.  Endo/Heme/Allergies:  Negative for polydipsia.  Psychiatric/Behavioral:  Negative for depression. The patient is not nervous/anxious.     Vitals:   Vitals:  09/19/22 1127  BP: 136/76  Pulse: 61  Resp: 15  Temp: 97.8 F (36.6 C)  TempSrc: Temporal  SpO2: 98%  Weight: 140 lb 6.4 oz (63.7 kg)     Body mass index is 22.66 kg/m.  Physical Exam:   Physical Exam Vitals and nursing note reviewed.  Constitutional:      General: She is not in acute distress.    Appearance: She is well-developed. She is not ill-appearing or toxic-appearing.  HENT:     Head: Normocephalic and atraumatic.     Right Ear: Tympanic membrane, ear canal and external ear normal. Tympanic membrane is not erythematous, retracted or bulging.     Left Ear: Tympanic membrane, ear canal and external ear normal. Tympanic membrane is not erythematous, retracted or bulging.     Nose: Nose normal.     Right Sinus: No maxillary sinus tenderness or frontal sinus tenderness.     Left Sinus: No maxillary sinus tenderness or frontal sinus tenderness.     Mouth/Throat:     Pharynx: Uvula midline. No posterior oropharyngeal erythema.  Eyes:     General: Lids are normal.        Right eye: Discharge and hordeolum present.     Extraocular Movements:     Right eye: Normal extraocular motion.     Conjunctiva/sclera:     Right eye: Right conjunctiva is injected.  Neck:     Trachea: Trachea normal.  Cardiovascular:     Rate and Rhythm: Normal rate and regular rhythm.     Pulses: Normal pulses.     Heart sounds: Normal heart sounds, S1 normal and S2 normal.  Pulmonary:     Effort: Pulmonary effort is normal.     Breath sounds: Normal breath sounds. No decreased breath sounds, wheezing, rhonchi or rales.  Lymphadenopathy:     Cervical: No cervical adenopathy.  Skin:    General: Skin is warm and dry.  Neurological:     Mental Status: She is alert.     GCS: GCS eye subscore  is 4. GCS verbal subscore is 5. GCS motor subscore is 6.  Psychiatric:        Speech: Speech normal.        Behavior: Behavior normal. Behavior is cooperative.     Assessment and Plan:   Acute right eye pain No red flags Suspect stye and conjunctivitis Start warm compresses and erythromycin Follow-up if new/worsening sx Red flags discussed  Diabetes mellitus without complication ([place]) Well controlled Reduce ryblesus 7 mg daily to help with weight maintenance Continue farxiga Follow-up in 2 months and we will take over care   [facility],acting as a scribe for Inda Coke, PA.,have documented all relevant documentation on the behalf of Inda Coke, PA,as directed by  Inda Coke, PA while in the presence of Inda Coke, [place].  I, Inda Coke, [place], have reviewed all documentation for this visit. The documentation on 09/19/22 for the exam, diagnosis, procedures, and orders are all accurate and complete.  Inda Coke PA-C

## 2022-09-19 NOTE — Patient Instructions (Addendum)
It was great to see you!  Decrease Ryblesus to 7 mg daily  Apply ointment to eye  Keep me posted on your symptoms  Follow-up in February to update your A1c and check in on your weight!

## 2022-10-08 ENCOUNTER — Encounter: Payer: Self-pay | Admitting: Physician Assistant

## 2022-10-09 ENCOUNTER — Telehealth (HOSPITAL_BASED_OUTPATIENT_CLINIC_OR_DEPARTMENT_OTHER): Payer: Self-pay | Admitting: *Deleted

## 2022-10-09 ENCOUNTER — Encounter (HOSPITAL_BASED_OUTPATIENT_CLINIC_OR_DEPARTMENT_OTHER): Payer: Self-pay

## 2022-10-09 ENCOUNTER — Other Ambulatory Visit: Payer: Self-pay | Admitting: Adult Health

## 2022-10-09 ENCOUNTER — Other Ambulatory Visit: Payer: Self-pay | Admitting: Physician Assistant

## 2022-10-09 DIAGNOSIS — I10 Essential (primary) hypertension: Secondary | ICD-10-CM

## 2022-10-09 MED ORDER — ATORVASTATIN CALCIUM 80 MG PO TABS: ORAL_TABLET | ORAL | 2 refills | Status: DC

## 2022-10-09 MED ORDER — ATORVASTATIN CALCIUM 80 MG PO TABS: ORAL_TABLET | ORAL | 3 refills | Status: DC

## 2022-10-09 NOTE — Telephone Encounter (Signed)
Rx(s) sent to pharmacy electronically.  

## 2022-10-21 ENCOUNTER — Ambulatory Visit (INDEPENDENT_AMBULATORY_CARE_PROVIDER_SITE_OTHER): Payer: Medicare Other

## 2022-10-21 VITALS — BP 104/62 | HR 64 | Temp 98.0°F | Wt 133.4 lb

## 2022-10-21 DIAGNOSIS — Z0000 Encounter for general adult medical examination without abnormal findings: Secondary | ICD-10-CM | POA: Diagnosis not present

## 2022-10-21 NOTE — Patient Instructions (Signed)
Catherine Mcconnell , Thank you for taking time to come for your Medicare Wellness Visit. I appreciate your ongoing commitment to your health goals. Please review the following plan we discussed and let me know if I can assist you in the future.   These are the goals we discussed:  Goals      Patient Stated     Exercise more      Patient Stated     Be healthier than I am         This is a list of the screening recommended for you and due dates:  Health Maintenance  Topic Date Due   Yearly kidney health urinalysis for diabetes  06/06/2021   COVID-19 Vaccine (4 - 2023-24 season) 06/14/2022   Eye exam for diabetics  12/03/2022   Hemoglobin A1C  01/09/2023   Complete foot exam   01/11/2023   Yearly kidney function blood test for diabetes  08/22/2023   Medicare Annual Wellness Visit  10/22/2023   DTaP/Tdap/Td vaccine (2 - Td or Tdap) 08/05/2028   Pneumonia Vaccine  Completed   Flu Shot  Completed   DEXA scan (bone density measurement)  Completed   Hepatitis C Screening: USPSTF Recommendation to screen - Ages 21-79 yo.  Completed   Zoster (Shingles) Vaccine  Completed   HPV Vaccine  Aged Out   Colon Cancer Screening  Discontinued    Advanced directives: Please bring a copy of your health care power of attorney and living will to the office at your convenience.  Conditions/risks identified: do more for self   Next appointment: Follow up in one year for your annual wellness visit    Preventive Care 65 Years and Older, Female Preventive care refers to lifestyle choices and visits with your health care provider that can promote health and wellness. What does preventive care include? A yearly physical exam. This is also called an annual well check. Dental exams once or twice a year. Routine eye exams. Ask your health care provider how often you should have your eyes checked. Personal lifestyle choices, including: Daily care of your teeth and gums. Regular physical activity. Eating a  healthy diet. Avoiding tobacco and drug use. Limiting alcohol use. Practicing safe sex. Taking low-dose aspirin every day. Taking vitamin and mineral supplements as recommended by your health care provider. What happens during an annual well check? The services and screenings done by your health care provider during your annual well check will depend on your age, overall health, lifestyle risk factors, and family history of disease. Counseling  Your health care provider may ask you questions about your: Alcohol use. Tobacco use. Drug use. Emotional well-being. Home and relationship well-being. Sexual activity. Eating habits. History of falls. Memory and ability to understand (cognition). Work and work Statistician. Reproductive health. Screening  You may have the following tests or measurements: Height, weight, and BMI. Blood pressure. Lipid and cholesterol levels. These may be checked every 5 years, or more frequently if you are over 73 years old. Skin check. Lung cancer screening. You may have this screening every year starting at age 9 if you have a 30-pack-year history of smoking and currently smoke or have quit within the past 15 years. Fecal occult blood test (FOBT) of the stool. You may have this test every year starting at age 55. Flexible sigmoidoscopy or colonoscopy. You may have a sigmoidoscopy every 5 years or a colonoscopy every 10 years starting at age 49. Hepatitis C blood test. Hepatitis B blood test.  Sexually transmitted disease (STD) testing. Diabetes screening. This is done by checking your blood sugar (glucose) after you have not eaten for a while (fasting). You may have this done every 1-3 years. Bone density scan. This is done to screen for osteoporosis. You may have this done starting at age 98. Mammogram. This may be done every 1-2 years. Talk to your health care provider about how often you should have regular mammograms. Talk with your health care  provider about your test results, treatment options, and if necessary, the need for more tests. Vaccines  Your health care provider may recommend certain vaccines, such as: Influenza vaccine. This is recommended every year. Tetanus, diphtheria, and acellular pertussis (Tdap, Td) vaccine. You may need a Td booster every 10 years. Zoster vaccine. You may need this after age 59. Pneumococcal 13-valent conjugate (PCV13) vaccine. One dose is recommended after age 76. Pneumococcal polysaccharide (PPSV23) vaccine. One dose is recommended after age 6. Talk to your health care provider about which screenings and vaccines you need and how often you need them. This information is not intended to replace advice given to you by your health care provider. Make sure you discuss any questions you have with your health care provider. Document Released: 10/27/2015 Document Revised: 06/19/2016 Document Reviewed: 08/01/2015 Elsevier Interactive Patient Education  2017 [place] Prevention in the Home Falls can cause injuries. They can happen to people of all ages. There are many things you can do to make your home safe and to help prevent falls. What can I do on the outside of my home? Regularly fix the edges of walkways and driveways and fix any cracks. Remove anything that might make you trip as you walk through a door, such as a raised step or threshold. Trim any bushes or trees on the path to your home. Use bright outdoor lighting. Clear any walking paths of anything that might make someone trip, such as rocks or tools. Regularly check to see if handrails are loose or broken. Make sure that both sides of any steps have handrails. Any raised decks and porches should have guardrails on the edges. Have any leaves, snow, or ice cleared regularly. Use sand or salt on walking paths during winter. Clean up any spills in your garage right away. This includes oil or grease spills. What can I do in the  bathroom? Use night lights. Install grab bars by the toilet and in the tub and shower. Do not use towel bars as grab bars. Use non-skid mats or decals in the tub or shower. If you need to sit down in the shower, use a plastic, non-slip stool. Keep the floor dry. Clean up any water that spills on the floor as soon as it happens. Remove soap buildup in the tub or shower regularly. Attach bath mats securely with double-sided non-slip rug tape. Do not have throw rugs and other things on the floor that can make you trip. What can I do in the bedroom? Use night lights. Make sure that you have a light by your bed that is easy to reach. Do not use any sheets or blankets that are too big for your bed. They should not hang down onto the floor. Have a firm chair that has side arms. You can use this for support while you get dressed. Do not have throw rugs and other things on the floor that can make you trip. What can I do in the kitchen? Clean up any spills right away.  Avoid walking on wet floors. Keep items that you use a lot in easy-to-reach places. If you need to reach something above you, use a strong step stool that has a grab bar. Keep electrical cords out of the way. Do not use floor polish or wax that makes floors slippery. If you must use wax, use non-skid floor wax. Do not have throw rugs and other things on the floor that can make you trip. What can I do with my stairs? Do not leave any items on the stairs. Make sure that there are handrails on both sides of the stairs and use them. Fix handrails that are broken or loose. Make sure that handrails are as long as the stairways. Check any carpeting to make sure that it is firmly attached to the stairs. Fix any carpet that is loose or worn. Avoid having throw rugs at the top or bottom of the stairs. If you do have throw rugs, attach them to the floor with carpet tape. Make sure that you have a light switch at the top of the stairs and the  bottom of the stairs. If you do not have them, ask someone to add them for you. What else can I do to help prevent falls? Wear shoes that: Do not have high heels. Have rubber bottoms. Are comfortable and fit you well. Are closed at the toe. Do not wear sandals. If you use a stepladder: Make sure that it is fully opened. Do not climb a closed stepladder. Make sure that both sides of the stepladder are locked into place. Ask someone to hold it for you, if possible. Clearly mark and make sure that you can see: Any grab bars or handrails. First and last steps. Where the edge of each step is. Use tools that help you move around (mobility aids) if they are needed. These include: Canes. Walkers. Scooters. Crutches. Turn on the lights when you go into a dark area. Replace any light bulbs as soon as they burn out. Set up your furniture so you have a clear path. Avoid moving your furniture around. If any of your floors are uneven, fix them. If there are any pets around you, be aware of where they are. Review your medicines with your doctor. Some medicines can make you feel dizzy. This can increase your chance of falling. Ask your doctor what other things that you can do to help prevent falls. This information is not intended to replace advice given to you by your health care provider. Make sure you discuss any questions you have with your health care provider. Document Released: 07/27/2009 Document Revised: 03/07/2016 Document Reviewed: 11/04/2014 Elsevier Interactive Patient Education  2017 [employer].

## 2022-10-21 NOTE — Progress Notes (Addendum)
Subjective:   Catherine Mcconnell is a 77 y.o. female who presents for Medicare Annual (Subsequent) preventive examination.  Review of Systems     Cardiac Risk Factors include: advanced age (>69mn, >>30women);hypertension;dyslipidemia;diabetes mellitus;sedentary lifestyle     Objective:    Today's Vitals   10/21/22 0955  BP: 104/62  Pulse: 64  Temp: 98 F (36.7 C)  SpO2: 99%  Weight: 133 lb 6.4 oz (60.5 kg)   Body mass index is 21.53 kg/m.     10/21/2022   10:27 AM 02/16/2022   11:26 AM 10/01/2021    9:49 AM 08/01/2021    9:15 AM 05/08/2021    6:15 PM 04/23/2021   12:59 PM 03/05/2021   11:17 AM  Advanced Directives  Does Patient Have a Medical Advance Directive? Yes Yes Yes No Yes Yes Yes  Type of AParamedicof [place] will  Healthcare Power of [place] will [place]Living will [place] will  Does patient want to make changes to medical advance directive?      No - Patient declined   Copy of [place] Chart? No - copy requested  No - copy requested    No - copy requested  Would patient like information on creating a medical advance directive?    No - Patient declined   No - Patient declined    Current Medications (verified) Outpatient Encounter Medications as of 10/21/2022  Medication Sig   amLODipine (NORVASC) 10 MG tablet TAKE 1 TABLET BY MOUTH DAILY   aspirin EC 81 MG tablet Take 1 tablet (81 mg total) by mouth daily. Swallow whole.   atorvastatin (LIPITOR) 80 MG tablet Take 1 tablet by mouth every Monday, Wednesday, and Friday.   Blood Glucose Monitoring Suppl (ONE TOUCH ULTRA MINI) w/Device KIT Use to check blood sugars twice a day.   carvedilol (COREG) 6.25 MG tablet TAKE 1 TABLET BY MOUTH TWICE  DAILY WITH MEALS   donepezil (ARICEPT ODT) 10 MG disintegrating tablet DISSOLVE 1 TABLET ON THE TONGUE  AT BEDTIME   erythromycin ophthalmic  ointment Apply nightly for 3-5 days   FARXIGA 10 MG TABS tablet Take 10 mg by mouth daily.   ferrous sulfate (IRON SUPPLEMENT) 325 (65 FE) MG tablet Take 1 tablet (325 mg total) by mouth 2 (two) times daily with a meal.   Lancets (ONETOUCH ULTRASOFT) lancets CHECK TWICE DAILY BEFORE  BREAKFAST AND DINNER   losartan (COZAAR) 50 MG tablet TAKE 1 TABLET BY MOUTH DAILY   LUMIGAN 0.01 % SOLN Place 1 drop into both eyes every evening.   nitroGLYCERIN (NITROSTAT) 0.4 MG SL tablet Place 1 tablet (0.4 mg total) under the tongue every 5 (five) minutes as needed for chest pain.   ONETOUCH ULTRA test strip USE TO TEST BLOOD SUGARS  TWICE DAILY   Semaglutide (RYBELSUS) 7 MG TABS Take 7 mg by mouth daily.   [DISCONTINUED] methocarbamol (ROBAXIN) 500 MG tablet Take 1 tablet (500 mg total) by mouth 2 (two) times daily as needed. To be taken after surgery (Patient not taking: Reported on 09/19/2022)   No facility-administered encounter medications on file as of 10/21/2022.    Allergies (verified) Lisinopril and Metformin and related   History: Past Medical History:  Diagnosis Date   Anemia    on meds   Arthritis    hips/wrists/back   Cataract    sx-bilateral   Chronic kidney disease  stage 3   Diabetes mellitus without complication (HCC)    on meds   Hypertension    on meds   Iron deficiency    on oral iron supplements, has not required transfusion   Mixed hyperlipidemia    on meds   OSA on CPAP    compliant   Sleep apnea    uses CPAP   Thyroid nodule    removed-not on meds at this time (12/01/2020)   Past Surgical History:  Procedure Laterality Date   ABDOMINAL HYSTERECTOMY  1996   MASS EXCISION Right 2017   right arm mass removal   THYROID SURGERY     left side removal benign nodule   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   Procedure: RIGHT TOTAL HIP ARTHROPLASTY ANTERIOR APPROACH;  Surgeon: Leandrew Koyanagi, MD;  Location: [facility];  Service: Orthopedics;  Laterality: Right;  3-C    Family History  Problem Relation Age of Onset   Stroke Mother    Hypertension Mother    Heart disease Mother    Heart attack Father    Early death Father    Hypertension Father    Diabetes Sister    Early death Sister    Hypertension Sister    Stroke Sister    Diabetes Sister    Early death Sister    Hyperlipidemia Sister    Kidney disease Sister    Diabetes Sister    Hyperlipidemia Sister    Hypertension Sister    Diabetes Sister    Early death Brother    Arthritis Brother    Heart attack Brother    Heart disease Brother    Arthritis Brother    Memory loss Paternal Uncle    Depression Daughter    Hypertension Daughter    Hyperlipidemia Daughter    Prostate cancer Son    Cancer Son    Early death Son    Colon polyps Neg Hx    Colon cancer Neg Hx    Esophageal cancer Neg Hx    Rectal cancer Neg Hx    Stomach cancer Neg Hx    Sleep apnea Neg Hx    Social History   Socioeconomic History   Marital status: Divorced    Spouse name: Not on file   Number of children: 6   Years of education: some college   Highest education level: Not on file  Occupational History   Occupation: Retired   Tobacco Use   Smoking status: Former    Packs/day: 0.25    Years: 1.00    Total pack years: 0.25    Types: Cigarettes    Quit date: 1960    Years since quitting: 64.0   Smokeless tobacco: Never   Tobacco comments:    only socially in high school  Vaping Use   Vaping Use: Never used  Substance and Sexual Activity   Alcohol use: Not Currently   Drug use: Never   Sexual activity: Not on file  Other Topics Concern   Not on file  Social History Narrative   03/16/20 lives with dgtr Weber Cooks   Mother of 6   --Two children have passed, one from from prostate cancer and one from liver failure   Used to be a CNA, Child psychotherapist   Recently moved back to [place] Determinants of Health   Financial Resource Strain: Low Risk  (10/21/2022)   Overall Financial Resource Strain  (CARDIA)    Difficulty of Paying Living Expenses: Not hard at  all  Food Insecurity: No Food Insecurity (10/21/2022)   Hunger Vital Sign    Worried About Running Out of Food in the Last Year: Never true    Ran Out of Food in the Last Year: Never true  Transportation Needs: No Transportation Needs (10/21/2022)   PRAPARE - Hydrologist (Medical): No    Lack of Transportation (Non-Medical): No  Physical Activity: Inactive (10/21/2022)   Exercise Vital Sign    Days of Exercise per Week: 0 days    Minutes of Exercise per Session: 0 min  Stress: No Stress Concern Present (10/21/2022)   [place]    Feeling of Stress : Not at all  Social Connections: Socially Isolated (10/21/2022)   Social Connection and Isolation Panel [NHANES]    Frequency of Communication with Friends and Family: Once a week    Frequency of Social Gatherings with Friends and Family: Once a week    Attends Religious Services: 1 to 4 times per year    Active Member of [employer] or Organizations: No    Attends Music therapist: Never    Marital Status: Divorced    Tobacco Counseling Counseling given: Not Answered Tobacco comments: only socially in high school   Clinical Intake:  Pre-visit preparation completed: Yes  Pain : No/denies pain     BMI - recorded: 21.53 Nutritional Status: BMI of 19-24  Normal Nutritional Risks: None Diabetes: Yes CBG done?: Yes (175 per pt) CBG resulted in Enter/ Edit results?: No Did pt. bring in CBG monitor from home?: No  How often do you need to have someone help you when you read instructions, pamphlets, or other written materials from your doctor or pharmacy?: 1 - Never  Diabetic?Nutrition Risk Assessment:  Has the patient had any N/V/D within the last 2 months?  No  Does the patient have any non-healing wounds?  No  Has the patient had any unintentional weight loss or weight  gain?  No   Diabetes:  Is the patient diabetic?  Yes  If diabetic, was a CBG obtained today?  Yes  Did the patient bring in their glucometer from home?  No  How often do you monitor your CBG's? Daily .   Financial Strains and Diabetes Management:  Are you having any financial strains with the device, your supplies or your medication? No .  Does the patient want to be seen by Chronic Care Management for management of their diabetes?  No  Would the patient like to be referred to a Nutritionist or for Diabetic Management?  No   Diabetic Exams:  Diabetic Eye Exam: Completed 12/03/21 Diabetic Foot Exam: Completed 01/10/22   Interpreter Needed?: No  Information entered by :: Charlott Rakes, LPN   Activities of Daily Living    10/21/2022   10:29 AM  In your present state of health, do you have any difficulty performing the following activities:  Hearing? 0  Vision? 0  Difficulty concentrating or making decisions? 0  Walking or climbing stairs? 0  Dressing or bathing? 0  Doing errands, shopping? 0  Preparing Food and eating ? N  Using the Toilet? N  In the past six months, have you accidently leaked urine? N  Do you have problems with loss of bowel control? N  Managing your Medications? N  Managing your Finances? N  Housekeeping or managing your Housekeeping? N    Patient Care Team: Inda Coke, [place] as PCP -  General (Physician Assistant) Buford Dresser, MD as PCP - Cardiology (Cardiology) Nigel Mormon, MD as Consulting Physician (Cardiology) [name], MD as Consulting Physician (Nephrology) Minette Brine, FNP (General Practice) Dohmeier, Asencion Partridge, MD as Consulting Physician (Neurology) [facility] as Consulting Physician  Indicate any recent Medical Services you may have received from other than [facility] providers in the past year (date may be approximate).     Assessment:   This is a routine wellness examination for  [place].  Hearing/Vision screen Hearing Screening - Comments:: Pt denies any hearing issues  Vision Screening - Comments:: Pt follows up with Dr Katy Fitch for annual eye exams   Dietary issues and exercise activities discussed: Current Exercise Habits: The patient does not participate in regular exercise at present   Goals Addressed             This Visit's Progress    Patient Stated       To do more for self        Depression Screen    10/21/2022   10:26 AM 10/01/2021    9:47 AM 05/10/2021    8:57 AM 09/15/2020   12:26 PM 08/09/2019   11:40 AM 08/13/2018    8:33 AM  PHQ 2/9 Scores  PHQ - 2 Score 0 0 0 0 0 0    Fall Risk    10/21/2022   10:29 AM 10/01/2021    9:49 AM 05/10/2021    8:57 AM 09/15/2020   12:32 PM 03/16/2020    9:36 AM  Fall Risk   Falls in the past year? 0 0 0 1 0  Number falls in past yr: 0 0 0 1   Injury with Fall? 0 0 0 1   Comment    scrape on left knee   Risk for fall due to : Impaired vision Impaired vision Impaired mobility Impaired vision   Risk for fall due to: Comment readers      Follow up Falls prevention discussed Falls prevention discussed  Falls prevention discussed     FALL RISK PREVENTION PERTAINING TO THE HOME:  Any stairs in or around the home? Yes  If so, are there any without handrails? No  Home free of loose throw rugs in walkways, pet beds, electrical cords, etc? Yes  Adequate lighting in your home to reduce risk of falls? Yes   ASSISTIVE DEVICES UTILIZED TO PREVENT FALLS:  Life alert? Yes  Use of a cane, walker or w/c? No  Grab bars in the bathroom? Yes  Shower chair or bench in shower? Yes  Elevated toilet seat or a handicapped toilet? Yes   TIMED UP AND GO:  Was the test performed? Yes .  Length of time to ambulate 10 feet: 15 sec.   Gait steady and fast without use of assistive device  Cognitive Function:    02/27/2022   12:02 PM 10/24/2021   11:17 AM 04/12/2021    2:25 PM 02/11/2020    3:14 PM 02/11/2020    3:11 PM   MMSE - Mini Mental State Exam  Orientation to time '4 3 5 5 5  '$ Orientation to Place '3 5 5 5 5  '$ Registration '3 3 3    '$ Attention/ Calculation '5 1 2 5 5  '$ Recall '2 2 3  1  '$ Language- name 2 objects '2 2 2    '$ Language- repeat '1 1 1    '$ Language- follow 3 step command '3 3 3 3   '$ Language- read & follow  direction '1 1 1    '$ Write a sentence '1 1 1 1 1  '$ Copy design 0 1 1    Total score '25 23 27        '$ 0/05/6760    9:50 PM 07/17/2020    9:19 AM 03/16/2020    9:40 AM  Montreal Cognitive Assessment   Visuospatial/ Executive (0/5) '5 5 4  '$ Naming (0/3) '3 3 3  '$ Attention: Read list of digits (0/2) 0 2 1  Attention: Read list of letters (0/1) '1 1 1  '$ Attention: Serial 7 subtraction starting at 100 (0/3) '1 2 3  '$ Language: Repeat phrase (0/2) '2 2 2  '$ Language : Fluency (0/1) 1 0 1  Abstraction (0/2) '2 2 2  '$ Delayed Recall (0/5) '1 2 1  '$ Orientation (0/6) '6 6 6  '$ Total '22 25 24  '$ Adjusted Score (based on education)  25       10/21/2022   10:30 AM 10/01/2021    9:52 AM 09/15/2020   12:35 PM 08/09/2019   11:41 AM  6CIT Screen  What Year? 0 points 0 points 0 points 0 points  What month? 0 points 0 points 0 points 0 points  What time? 0 points 3 points  0 points  Count back from 20 0 points 0 points 0 points 0 points  Months in reverse 0 points 0 points 0 points 0 points  Repeat phrase 2 points 2 points 2 points 0 points  Total Score 2 points 5 points  0 points    Immunizations Immunization History  Administered Date(s) Administered   Fluad Quad(high Dose 65+) 07/02/2019, 07/24/2020, 07/19/2021, 08/07/2022   Influenza, High Dose Seasonal PF 07/14/2018   PFIZER Comirnaty(Gray Top)Covid-19 Tri-Sucrose Vaccine 11/07/2020   PFIZER(Purple Top)SARS-COV-2 Vaccination 01/20/2020, 02/14/2020   Pneumococcal Conjugate-13 07/24/2020   Pneumococcal Polysaccharide-23 11/13/2018   Tdap 08/05/2018   Zoster Recombinat (Shingrix) 06/05/2018, 08/05/2018    TDAP status: Up to date  Flu Vaccine status: Up to  date  Pneumococcal vaccine status: Up to date  Covid-19 vaccine status: Completed vaccines  Qualifies for Shingles Vaccine? Yes   Zostavax completed Yes   Shingrix Completed?: Yes  Screening Tests Health Maintenance  Topic Date Due   Diabetic kidney evaluation - Urine ACR  06/06/2021   COVID-19 Vaccine (4 - 2023-24 season) 06/14/2022   OPHTHALMOLOGY EXAM  12/03/2022   HEMOGLOBIN A1C  01/09/2023   FOOT EXAM  01/11/2023   Diabetic kidney evaluation - eGFR measurement  08/22/2023   Medicare Annual Wellness (AWV)  10/22/2023   DTaP/Tdap/Td (2 - Td or Tdap) 08/05/2028   Pneumonia Vaccine 6+ Years old  Completed   INFLUENZA VACCINE  Completed   DEXA SCAN  Completed   Hepatitis C Screening  Completed   Zoster Vaccines- Shingrix  Completed   HPV VACCINES  Aged Out   COLONOSCOPY (Pts 45-84yr Insurance coverage will need to be confirmed)  Discontinued    Health Maintenance  Health Maintenance Due  Topic Date Due   Diabetic kidney evaluation - Urine ACR  06/06/2021   COVID-19 Vaccine (4 - 2023-24 season) 06/14/2022    Colorectal cancer screening: No longer required.   Mammogram status: Completed 10/19/21. Repeat every year  Bone Density status: Completed 08/12/18. Results reflect: Bone density results: NORMAL. Repeat every 2 years.   Additional Screening:  Hepatitis C Screening: Completed 01/19/20  Vision Screening: Recommended annual ophthalmology exams for early detection of glaucoma and other disorders of the eye. Is the patient up to date with their  annual eye exam?  Yes  Who is the provider or what is the name of the office in which the patient attends annual eye exams? Dr Katy Fitch  If pt is not established with a provider, would they like to be referred to a provider to establish care? No .   Dental Screening: Recommended annual dental exams for proper oral hygiene  Community Resource Referral / Chronic Care Management: CRR required this visit?  No   CCM required this  visit?  No      Plan:     I have personally reviewed and noted the following in the patient's chart:   Medical and social history Use of alcohol, tobacco or illicit drugs  Current medications and supplements including opioid prescriptions. Patient is not currently taking opioid prescriptions. Functional ability and status Nutritional status Physical activity Advanced directives List of other physicians Hospitalizations, surgeries, and ER visits in previous 12 months Vitals Screenings to include cognitive, depression, and falls Referrals and appointments  In addition, I have reviewed and discussed with patient certain preventive protocols, quality metrics, and best practice recommendations. A written personalized care plan for preventive services as well as general preventive health recommendations were provided to patient.     Willette Brace, LPN   0/12/5463   Nurse Notes: none

## 2022-11-14 ENCOUNTER — Encounter (HOSPITAL_BASED_OUTPATIENT_CLINIC_OR_DEPARTMENT_OTHER): Payer: Self-pay | Admitting: Cardiology

## 2022-11-14 ENCOUNTER — Ambulatory Visit (HOSPITAL_BASED_OUTPATIENT_CLINIC_OR_DEPARTMENT_OTHER): Payer: Medicare Other | Admitting: Cardiology

## 2022-11-14 VITALS — BP 134/64 | HR 50 | Ht 66.0 in | Wt 136.0 lb

## 2022-11-14 DIAGNOSIS — N1832 Chronic kidney disease, stage 3b: Secondary | ICD-10-CM

## 2022-11-14 DIAGNOSIS — I2510 Atherosclerotic heart disease of native coronary artery without angina pectoris: Secondary | ICD-10-CM | POA: Diagnosis not present

## 2022-11-14 DIAGNOSIS — Z794 Long term (current) use of insulin: Secondary | ICD-10-CM | POA: Diagnosis not present

## 2022-11-14 DIAGNOSIS — Z7189 Other specified counseling: Secondary | ICD-10-CM

## 2022-11-14 DIAGNOSIS — I10 Essential (primary) hypertension: Secondary | ICD-10-CM | POA: Diagnosis not present

## 2022-11-14 DIAGNOSIS — E782 Mixed hyperlipidemia: Secondary | ICD-10-CM | POA: Diagnosis not present

## 2022-11-14 DIAGNOSIS — E1122 Type 2 diabetes mellitus with diabetic chronic kidney disease: Secondary | ICD-10-CM | POA: Diagnosis not present

## 2022-11-14 NOTE — Progress Notes (Signed)
Cardiology Office Note:    Date:  11/14/2022   ID:  Catherine Mcconnell, DOB Jun 16, 1946, MRN [id number]  PCP:  Inda Coke, PA  Cardiologist:  Buford Dresser, MD  Referring MD: Inda Coke, [place]   CC: follow up  History of Present Illness:    Catherine Mcconnell is a 77 y.o. female with a hx of hypertension, OSA on CPAP, type II diabetes, chronic kidney disease stage 3a, mixed hyperlipidemia, who is seen for follow up. Previously seen as a new consult at the request of Inda Coke, [place] for the evaluation and management of hypertension.  She was accompanied by her daughter today, who is also my patient. She is a retired Sports administrator.   Cardiac history: hypertension "for decades." Hypercholesterolemia, but does not tolerate daily statin due to myalgia. Type II diabetes for >20 years. Chronic kidney disease, follows with Dr. Hollie Salk. Calcium score very elevated but asymptomatic. Pending PCSK9i.  At her last visit she was doing well. We reviewed her PharmD visit and her calcium score results. She was active without symptoms. Discussed options for further evaluation. Her blood pressure in clinic was 150/72. Her daughter reports that she had not taken her blood pressure medication yet. Upon recheck, her blood pressure improved to 134/70. She underwent ETT 08/2022 which was very reassuring. Blood pressure increased with exercise, which may be why she felt tired/SOB with exercise. Overall no evidence of a blockage as the reason for limitation. Planned to work on controlling BP and gradually increase exercise as she is able.  Today, she states she is feeling well and is tolerating her medications. For activity she is able to go down to the basement and complete her laundry. She would like to go outside for exercise but has been limited by the cold weather.  She states that her sleeping quality varies, and she usually wakes up early. In the mornings she doesn't feel tired. Her daughter is  concerned that she has been dozing off in the middle of conversations.  She denies any palpitations, chest pain, shortness of breath, or peripheral edema. No lightheadedness, headaches, syncope, orthopnea, or PND.   Past Medical History:  Diagnosis Date   Anemia    on meds   Arthritis    hips/wrists/back   Cataract    sx-bilateral   Chronic kidney disease    stage 3   Diabetes mellitus without complication (HCC)    on meds   Hypertension    on meds   Iron deficiency    on oral iron supplements, has not required transfusion   Mixed hyperlipidemia    on meds   OSA on CPAP    compliant   Sleep apnea    uses CPAP   Thyroid nodule    removed-not on meds at this time (12/01/2020)    Past Surgical History:  Procedure Laterality Date   ABDOMINAL HYSTERECTOMY  1996   MASS EXCISION Right 2017   right arm mass removal   THYROID SURGERY     left side removal benign nodule   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   Procedure: RIGHT TOTAL HIP ARTHROPLASTY ANTERIOR APPROACH;  Surgeon: Leandrew Koyanagi, MD;  Location: [facility];  Service: Orthopedics;  Laterality: Right;  3-C    Current Medications: Current Outpatient Medications on File Prior to Visit  Medication Sig   amLODipine (NORVASC) 10 MG tablet TAKE 1 TABLET BY MOUTH DAILY   aspirin EC 81 MG tablet Take 1 tablet (81 mg total) by mouth daily.  Swallow whole.   atorvastatin (LIPITOR) 80 MG tablet Take 1 tablet by mouth every Monday, Wednesday, and Friday.   Blood Glucose Monitoring Suppl (ONE TOUCH ULTRA MINI) w/Device KIT Use to check blood sugars twice a day.   carvedilol (COREG) 6.25 MG tablet TAKE 1 TABLET BY MOUTH TWICE  DAILY WITH MEALS   donepezil (ARICEPT ODT) 10 MG disintegrating tablet DISSOLVE 1 TABLET ON THE TONGUE  AT BEDTIME   erythromycin ophthalmic ointment Apply nightly for 3-5 days   FARXIGA 10 MG TABS tablet Take 10 mg by mouth daily.   ferrous sulfate (IRON SUPPLEMENT) 325 (65 FE) MG tablet Take 1 tablet (325  mg total) by mouth 2 (two) times daily with a meal.   Lancets (ONETOUCH ULTRASOFT) lancets CHECK TWICE DAILY BEFORE  BREAKFAST AND DINNER   losartan (COZAAR) 50 MG tablet TAKE 1 TABLET BY MOUTH DAILY   LUMIGAN 0.01 % SOLN Place 1 drop into both eyes every evening.   nitroGLYCERIN (NITROSTAT) 0.4 MG SL tablet Place 1 tablet (0.4 mg total) under the tongue every 5 (five) minutes as needed for chest pain.   ONETOUCH ULTRA test strip USE TO TEST BLOOD SUGARS  TWICE DAILY   Semaglutide (RYBELSUS) 7 MG TABS Take 7 mg by mouth daily.   No current facility-administered medications on file prior to visit.     Allergies:   Lisinopril and Metformin and related   Social History   Tobacco Use   Smoking status: Former    Packs/day: 0.25    Years: 1.00    Total pack years: 0.25    Types: Cigarettes    Quit date: 1960    Years since quitting: 64.1   Smokeless tobacco: Never   Tobacco comments:    only socially in high school  Vaping Use   Vaping Use: Never used  Substance Use Topics   Alcohol use: Not Currently   Drug use: Never    Family History: family history includes Arthritis in her brother and brother; Cancer in her son; Depression in her daughter; Diabetes in her sister, sister, sister, and sister; Early death in her brother, father, sister, sister, and son; Heart attack in her brother and father; Heart disease in her brother and mother; Hyperlipidemia in her daughter, sister, and sister; Hypertension in her daughter, father, mother, sister, and sister; Kidney disease in her sister; Memory loss in her paternal uncle; Prostate cancer in her son; Stroke in her mother and sister. There is no history of Colon polyps, Colon cancer, Esophageal cancer, Rectal cancer, Stomach cancer, or Sleep apnea.  ROS:   Please see the history of present illness.   (+) Daytime somnolence Additional pertinent ROS otherwise unremarkable.  EKGs/Labs/Other Studies Reviewed:    The following studies were  reviewed today:  Exercise Tolerance Test  08/26/2022:   Exercise capacity was moderately impaired. Patient exercised for 5 min and 1 sec. Maximum HR of 157 bpm. MPHR 109.0 %. Peak METS 7.0 . The patient experienced no angina during the test. The test was stopped because the patient experienced fatigue. Hypertensive blood pressure and normal heart rate response noted during stress. Heart rate recovery was normal.   Arrhythmias during stress: rare PVCs. ECG was interpretable and conclusive. The ECG was negative for ischemia.   This is a low risk study.  Calcium score 07/12/22: Coronary Calcium Score:  Left main: 71.3  Left anterior descending artery: 2580  Left circumflex artery: 0  Right coronary artery: 302   Total: 2953  Percentile:  99th   Aorta: Borderline dilated to 39 mm at the level of the main PA bifurcation. Aortic atherosclerosis.  Right LE Venous Doppler  02/16/2022: IMPRESSION: No evidence of RIGHT LOWER extremity DVT.  Echocardiogram  06/12/2021: Left ventricle cavity is normal in size. Mild concentric hypertrophy of  the left ventricle. Normal global wall motion. Normal LV systolic function  with EF 60%. Doppler evidence of grade I (impaired) diastolic dysfunction,  normal LAP.  Structurally normal trileaflet aortic valve. Mild (Grade I) aortic  regurgitation.  No evidence of pulmonary hypertension.  EKG:  EKG is personally reviewed.   11/14/2022:  EKG was not ordered. 08/12/22: not ordered  05/30/2022:  sinus bradycardia at 57 bpm, borderline LVH  Recent Labs: 02/20/2022: Hemoglobin 13.0; Platelets 222 08/21/2022: ALT 17; BUN 21; Creatinine 1.7; Potassium 3.9; Sodium 140   Recent Lipid Panel    Component Value Date/Time   CHOL 201 (H) 06/06/2022 0824   TRIG 71 06/06/2022 0824   HDL 66 06/06/2022 0824   CHOLHDL 3.0 06/06/2022 0824   CHOLHDL 3.6 06/06/2020 1113   VLDL 17.2 08/13/2018 0916   LDLCALC 122 (H) 06/06/2022 0824   LDLCALC 134 (H) 06/06/2020 1113     Physical Exam:    VS:  BP 134/64   Pulse (!) 50   Ht '5\' 6"'$  (1.676 m)   Wt 136 lb (61.7 kg)   BMI 21.95 kg/m     Wt Readings from Last 3 Encounters:  11/14/22 136 lb (61.7 kg)  10/21/22 133 lb 6.4 oz (60.5 kg)  09/19/22 140 lb 6.4 oz (63.7 kg)    GEN: Well nourished, well developed in no acute distress HEENT: Normal, moist mucous membranes NECK: No JVD CARDIAC: regular rhythm, normal S1 and S2, no rubs or gallops. 1/6 systolic murmur. VASCULAR: Radial and DP pulses 2+ bilaterally. No carotid bruits RESPIRATORY:  Clear to auscultation without rales, wheezing or rhonchi  ABDOMEN: Soft, non-tender, non-distended MUSCULOSKELETAL:  Ambulates independently; SKIN: Warm and dry, no edema NEUROLOGIC:  Alert and oriented x 3. No focal neuro deficits noted. PSYCHIATRIC:  Normal affect    ASSESSMENT:    1. Nonocclusive coronary atherosclerosis of native coronary artery   2. Essential hypertension   3. Type 2 diabetes mellitus with stage 3b chronic kidney disease, with long-term current use of insulin ([place])   4. Mixed hyperlipidemia   5. Counseling on health promotion and disease prevention     PLAN:    Hypertension -continue amlodipine, carvedilol, losartan  Coronary calcification, consistent with nonobstructive CAD Mixed hyperlipidemia -continue atorvastatin 80 mg three times weekly. Did not tolerate daily dosing due to myalgia. -aspirin stopped previously but restarted after calcium score -last LDL 122. Sent to lipid clinic and started repatha application process. LDL goal <70 -ca score 2953 -ETT normal, suggests no obstructive disease at the time of test  Chronic kidney disease stage 3b -followed by Dr. Hollie Salk at [facility] -on losartan, farxiga  Type II diabetes -on farxiga, rybelsus, and insulin  Cardiac risk counseling and prevention recommendations: -recommend heart healthy/Mediterranean diet, with whole grains, fruits, vegetable, fish, lean meats, nuts,  and olive oil. Limit salt. -recommend moderate walking, 3-5 times/week for 30-50 minutes each session. Aim for at least 150 minutes.week. Goal should be pace of 3 miles/hours, or walking 1.5 miles in 30 minutes -recommend avoidance of tobacco products. Avoid excess alcohol.  Plan for follow up: 3 months or sooner as needed.  Buford Dresser, MD, PhD, [place]  Medication Adjustments/Labs and Tests Ordered: Current medicines are reviewed at length with the patient today.  Concerns regarding medicines are outlined above.   No orders of the defined types were placed in this encounter.  No orders of the defined types were placed in this encounter.  Patient Instructions  Medication Instructions:  The current medical regimen is effective;  continue present plan and medications.   *If you need a refill on your cardiac medications before your next appointment, please call your pharmacy*   Lab Work: None   Testing/Procedures: None   Follow-Up: At [facility], you and your health needs are our priority.  As part of our continuing mission to provide you with exceptional heart care, we have created designated Provider Care Teams.  These Care Teams include your primary Cardiologist (physician) and Advanced Practice Providers (APPs -  Physician Assistants and Nurse Practitioners) who all work together to provide you with the care you need, when you need it.  We recommend signing up for the patient portal called "MyChart".  Sign up information is provided on this After Visit Summary.  MyChart is used to connect with patients for Virtual Visits (Telemedicine).  Patients are able to view lab/test results, encounter notes, upcoming appointments, etc.  Non-urgent messages can be sent to your provider as well.   To learn more about what you can do with MyChart, go to [email or web address].    Your next appointment:   3 month(s)  Provider:   Buford Dresser, MD    Other Instructions None    I,Mathew Stumpf,acting as a scribe for Buford Dresser, MD.,have documented all relevant documentation on the behalf of Buford Dresser, MD,as directed by  Buford Dresser, MD while in the presence of Buford Dresser, MD.   I, Buford Dresser, MD, have reviewed all documentation for this visit. The documentation on 11/14/22 for the exam, diagnosis, procedures, and orders are all accurate and complete.   Signed, Buford Dresser, MD PhD 11/14/2022     [place]

## 2022-11-14 NOTE — Patient Instructions (Signed)
Medication Instructions:  The current medical regimen is effective;  continue present plan and medications.   *If you need a refill on your cardiac medications before your next appointment, please call your pharmacy*   Lab Work: None   Testing/Procedures: None   Follow-Up: [facility], you and your health needs are our priority.  As part of our continuing mission to provide you with exceptional heart care, we have created designated Provider Care Teams.  These Care Teams include your primary Cardiologist (physician) and Advanced Practice Providers (APPs -  Physician Assistants and Nurse Practitioners) who all work together to provide you with the care you need, when you need it.  We recommend signing up for the patient portal called "MyChart".  Sign up information is provided on this After Visit Summary.  MyChart is used to connect with patients for Virtual Visits (Telemedicine).  Patients are able to view lab/test results, encounter notes, upcoming appointments, etc.  Non-urgent messages can be sent to your provider as well.   To learn more about what you can do with MyChart, go to [email or web address].    Your next appointment:   3 month(s)  Provider:   Bridgette Christopher, MD    Other Instructions None  

## 2022-11-18 ENCOUNTER — Other Ambulatory Visit: Payer: Self-pay | Admitting: Internal Medicine

## 2022-11-18 ENCOUNTER — Ambulatory Visit (INDEPENDENT_AMBULATORY_CARE_PROVIDER_SITE_OTHER): Payer: Medicare Other | Admitting: Physician Assistant

## 2022-11-18 ENCOUNTER — Encounter: Payer: Self-pay | Admitting: Physician Assistant

## 2022-11-18 VITALS — BP 120/62 | HR 49 | Temp 97.5°F | Ht 66.0 in | Wt 134.5 lb

## 2022-11-18 DIAGNOSIS — N1832 Chronic kidney disease, stage 3b: Secondary | ICD-10-CM | POA: Diagnosis not present

## 2022-11-18 DIAGNOSIS — R630 Anorexia: Secondary | ICD-10-CM | POA: Diagnosis not present

## 2022-11-18 DIAGNOSIS — D631 Anemia in chronic kidney disease: Secondary | ICD-10-CM | POA: Diagnosis not present

## 2022-11-18 DIAGNOSIS — E119 Type 2 diabetes mellitus without complications: Secondary | ICD-10-CM | POA: Diagnosis not present

## 2022-11-18 LAB — IBC + FERRITIN
Ferritin: 305.9 ng/mL — ABNORMAL HIGH (ref 10.0–291.0)
Iron: 113 ug/dL (ref 42–145)
Saturation Ratios: 40.8 % (ref 20.0–50.0)
TIBC: 277.2 ug/dL (ref 250.0–450.0)
Transferrin: 198 mg/dL — ABNORMAL LOW (ref 212.0–360.0)

## 2022-11-18 LAB — CBC WITH DIFFERENTIAL/PLATELET
Basophils Absolute: 0 10*3/uL (ref 0.0–0.1)
Basophils Relative: 1.2 % (ref 0.0–3.0)
Eosinophils Absolute: 0.2 10*3/uL (ref 0.0–0.7)
Eosinophils Relative: 5.7 % — ABNORMAL HIGH (ref 0.0–5.0)
HCT: 35 % — ABNORMAL LOW (ref 36.0–46.0)
Hemoglobin: 12 g/dL (ref 12.0–15.0)
Lymphocytes Relative: 34.1 % (ref 12.0–46.0)
Lymphs Abs: 1.1 10*3/uL (ref 0.7–4.0)
MCHC: 34.4 g/dL (ref 30.0–36.0)
MCV: 89.3 fl (ref 78.0–100.0)
Monocytes Absolute: 0.2 10*3/uL (ref 0.1–1.0)
Monocytes Relative: 7 % (ref 3.0–12.0)
Neutro Abs: 1.6 10*3/uL (ref 1.4–7.7)
Neutrophils Relative %: 52 % (ref 43.0–77.0)
Platelets: 171 10*3/uL (ref 150.0–400.0)
RBC: 3.92 Mil/uL (ref 3.87–5.11)
RDW: 13.5 % (ref 11.5–15.5)
WBC: 3.1 10*3/uL — ABNORMAL LOW (ref 4.0–10.5)

## 2022-11-18 LAB — POCT GLYCOSYLATED HEMOGLOBIN (HGB A1C): Hemoglobin A1C: 7.7 % — AB (ref 4.0–5.6)

## 2022-11-18 MED ORDER — MIRTAZAPINE 7.5 MG PO TABS: 7.5000 mg | ORAL_TABLET | Freq: Every day | ORAL | 1 refills | Status: DC

## 2022-11-18 NOTE — Patient Instructions (Signed)
It was great to see you!  Follow-up in 1 month  Decrease iron to daily Increase rybelsus to 14 mg daily Start 7.5 mg remeron nightly  Let's follow-up in 1 month, sooner if you have concerns.  Take care,  Inda Coke PA-C

## 2022-11-18 NOTE — Progress Notes (Signed)
Catherine Mcconnell is a 77 y.o. female here for a follow up of a pre-existing problem.  History of Present Illness:   Chief Complaint  Patient presents with   Diabetes    Diabetes    Diabetes 5 month follow-up. Current DM meds: Rybelsus 7 mg and Farxiga 10 mg. Blood sugars at home are: quite variable. Patient is compliant with medications. Denies: hypoglycemic or hyperglycemic episodes or symptoms. This patient's diabetes is complicated by CKD, HTN, HLD.  Lab Results  Component Value Date   HGBA1C 7.7 (A) 11/18/2022   Wt Readings from Last 3 Encounters:  11/18/22 134 lb 8 oz (61 kg)  11/14/22 136 lb (61.7 kg)  10/21/22 133 lb 6.4 oz (60.5 kg)    Poor appetite At last visit, we decreased her rybelsus from 14 mg to 7 mg daily to see if this would help with her appetite. This has made no difference for her.  She still has limited intake of meals throughout the day. She is good about eating prepped meals that family has made for her.   Anemia Takes oral iron 325 mg twice daily. Has been taking this regularly. Daughter wants to know if she needs to take this regularly.   Past Medical History:  Diagnosis Date   Anemia    on meds   Arthritis    hips/wrists/back   Cataract    sx-bilateral   Chronic kidney disease    stage 3   Diabetes mellitus without complication (HCC)    on meds   Hypertension    on meds   Iron deficiency    on oral iron supplements, has not required transfusion   Mixed hyperlipidemia    on meds   OSA on CPAP    compliant   Sleep apnea    uses CPAP   Thyroid nodule    removed-not on meds at this time (12/01/2020)     Social History   Tobacco Use   Smoking status: Former    Packs/day: 0.25    Years: 1.00    Total pack years: 0.25    Types: Cigarettes    Quit date: 1960    Years since quitting: 64.1   Smokeless tobacco: Never   Tobacco comments:    only socially in high school  Vaping Use   Vaping Use: Never used  Substance Use Topics    Alcohol use: Not Currently   Drug use: Never    Past Surgical History:  Procedure Laterality Date   ABDOMINAL HYSTERECTOMY  1996   MASS EXCISION Right 2017   right arm mass removal   THYROID SURGERY     left side removal benign nodule   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   Procedure: RIGHT TOTAL HIP ARTHROPLASTY ANTERIOR APPROACH;  Surgeon: Leandrew Koyanagi, MD;  Location: [facility];  Service: Orthopedics;  Laterality: Right;  3-C    Family History  Problem Relation Age of Onset   Stroke Mother    Hypertension Mother    Heart disease Mother    Heart attack Father    Early death Father    Hypertension Father    Diabetes Sister    Early death Sister    Hypertension Sister    Stroke Sister    Diabetes Sister    Early death Sister    Hyperlipidemia Sister    Kidney disease Sister    Diabetes Sister    Hyperlipidemia Sister    Hypertension Sister    Diabetes Sister  Early death Brother    Arthritis Brother    Heart attack Brother    Heart disease Brother    Arthritis Brother    Memory loss Paternal Uncle    Depression Daughter    Hypertension Daughter    Hyperlipidemia Daughter    Prostate cancer Son    Cancer Son    Early death Son    Colon polyps Neg Hx    Colon cancer Neg Hx    Esophageal cancer Neg Hx    Rectal cancer Neg Hx    Stomach cancer Neg Hx    Sleep apnea Neg Hx     Allergies  Allergen Reactions   Lisinopril Rash   Metformin And Related Swelling    Current Medications:   Current Outpatient Medications:    amLODipine (NORVASC) 10 MG tablet, TAKE 1 TABLET BY MOUTH DAILY, Disp: 100 tablet, Rfl: 2   aspirin EC 81 MG tablet, Take 1 tablet (81 mg total) by mouth daily. Swallow whole., Disp: 90 tablet, Rfl: 3   atorvastatin (LIPITOR) 80 MG tablet, Take 1 tablet by mouth every Monday, Wednesday, and Friday., Disp: 90 tablet, Rfl: 3   Blood Glucose Monitoring Suppl (ONE TOUCH ULTRA MINI) w/Device KIT, Use to check blood sugars twice a day., Disp: 1  kit, Rfl: 0   carvedilol (COREG) 6.25 MG tablet, TAKE 1 TABLET BY MOUTH TWICE  DAILY WITH MEALS, Disp: 200 tablet, Rfl: 2   donepezil (ARICEPT ODT) 10 MG disintegrating tablet, DISSOLVE 1 TABLET ON THE TONGUE  AT BEDTIME, Disp: 100 tablet, Rfl: 2   FARXIGA 10 MG TABS tablet, Take 10 mg by mouth daily., Disp: , Rfl:    ferrous sulfate (IRON SUPPLEMENT) 325 (65 FE) MG tablet, Take 1 tablet (325 mg total) by mouth 2 (two) times daily with a meal., Disp: 60 tablet, Rfl: 2   Lancets (ONETOUCH ULTRASOFT) lancets, CHECK TWICE DAILY BEFORE  BREAKFAST AND DINNER, Disp: 200 each, Rfl: 2   losartan (COZAAR) 50 MG tablet, TAKE 1 TABLET BY MOUTH DAILY, Disp: 100 tablet, Rfl: 2   LUMIGAN 0.01 % SOLN, Place 1 drop into both eyes every evening., Disp: , Rfl:    nitroGLYCERIN (NITROSTAT) 0.4 MG SL tablet, Place 1 tablet (0.4 mg total) under the tongue every 5 (five) minutes as needed for chest pain., Disp: 25 tablet, Rfl: 4   ONETOUCH ULTRA test strip, USE TO TEST BLOOD SUGARS  TWICE DAILY, Disp: 200 strip, Rfl: 4   Semaglutide (RYBELSUS) 7 MG TABS, Take 7 mg by mouth daily., Disp: 90 tablet, Rfl: 0   Review of Systems:   ROS Negative unless otherwise specified per HPI.  Vitals:   Vitals:   11/18/22 1008  BP: 120/62  Pulse: (!) 49  Temp: (!) 97.5 F (36.4 C)  TempSrc: Temporal  SpO2: 97%  Weight: 134 lb 8 oz (61 kg)  Height: '5\' 6"'$  (1.676 m)     Body mass index is 21.71 kg/m.  Physical Exam:   Physical Exam Vitals and nursing note reviewed.  Constitutional:      General: She is not in acute distress.    Appearance: She is well-developed. She is not ill-appearing or toxic-appearing.  Cardiovascular:     Rate and Rhythm: Normal rate and regular rhythm.     Pulses: Normal pulses.     Heart sounds: Normal heart sounds, S1 normal and S2 normal.  Pulmonary:     Effort: Pulmonary effort is normal.     Breath sounds: Normal breath  sounds.  Skin:    General: Skin is warm and dry.  Neurological:      Mental Status: She is alert.     GCS: GCS eye subscore is 4. GCS verbal subscore is 5. GCS motor subscore is 6.  Psychiatric:        Speech: Speech normal.        Behavior: Behavior normal. Behavior is cooperative.    Results for orders placed or performed in visit on 11/18/22  POCT glycosylated hemoglobin (Hb A1C)  Result Value Ref Range   Hemoglobin A1C 7.7 (A) 4.0 - 5.6 %     Assessment and Plan:   Diabetes mellitus without complication (HCC) Worsened Increase Rybelsus back to 14 mg Continue farxiga 10 mg Reviewed nutrition recommendations today -- provided multiple handouts We have also provided Dexcom Sample for her to trial Follow-up in 1 month, sooner if concerns  Poor appetite Reviewed better eating strategies Trial 7.5 mg remeron daily Follow-up in 1 month, sooner if concerns  Anemia due to stage 3b chronic kidney disease ([place]) Update blood work Reduced iron to 1 tablet daily Will likely update blood work in 1 month to assess iron stability  Inda Coke, PA-C

## 2022-11-20 ENCOUNTER — Other Ambulatory Visit: Payer: Self-pay | Admitting: Physician Assistant

## 2022-11-25 ENCOUNTER — Encounter: Payer: Self-pay | Admitting: Physician Assistant

## 2022-11-25 DIAGNOSIS — H2513 Age-related nuclear cataract, bilateral: Secondary | ICD-10-CM | POA: Diagnosis not present

## 2022-11-25 DIAGNOSIS — H40033 Anatomical narrow angle, bilateral: Secondary | ICD-10-CM | POA: Diagnosis not present

## 2022-11-25 DIAGNOSIS — E119 Type 2 diabetes mellitus without complications: Secondary | ICD-10-CM | POA: Diagnosis not present

## 2022-11-25 DIAGNOSIS — G500 Trigeminal neuralgia: Secondary | ICD-10-CM | POA: Diagnosis not present

## 2022-11-25 DIAGNOSIS — H04123 Dry eye syndrome of bilateral lacrimal glands: Secondary | ICD-10-CM | POA: Diagnosis not present

## 2022-11-25 DIAGNOSIS — H40023 Open angle with borderline findings, high risk, bilateral: Secondary | ICD-10-CM | POA: Diagnosis not present

## 2022-11-26 ENCOUNTER — Encounter: Payer: Self-pay | Admitting: Neurology

## 2022-11-26 ENCOUNTER — Ambulatory Visit: Payer: Medicare Other | Admitting: Neurology

## 2022-11-26 VITALS — BP 141/71 | HR 53 | Ht 66.0 in | Wt 133.4 lb

## 2022-11-26 DIAGNOSIS — G4739 Other sleep apnea: Secondary | ICD-10-CM | POA: Insufficient documentation

## 2022-11-26 DIAGNOSIS — Z9989 Dependence on other enabling machines and devices: Secondary | ICD-10-CM | POA: Insufficient documentation

## 2022-11-26 DIAGNOSIS — M87051 Idiopathic aseptic necrosis of right femur: Secondary | ICD-10-CM

## 2022-11-26 DIAGNOSIS — G301 Alzheimer's disease with late onset: Secondary | ICD-10-CM

## 2022-11-26 DIAGNOSIS — G501 Atypical facial pain: Secondary | ICD-10-CM | POA: Diagnosis not present

## 2022-11-26 DIAGNOSIS — F0280 Dementia in other diseases classified elsewhere without behavioral disturbance: Secondary | ICD-10-CM | POA: Insufficient documentation

## 2022-11-26 MED ORDER — GABAPENTIN 100 MG PO CAPS: ORAL_CAPSULE | ORAL | 1 refills | Status: DC

## 2022-11-26 MED ORDER — DONEPEZIL HCL 10 MG PO TBDP: ORAL_TABLET | ORAL | 2 refills | Status: DC

## 2022-11-26 NOTE — Patient Instructions (Signed)
I ordered a SPLIT night PSG for  evaluating baseline apnea and need for CPAP.   I started you on gabapentin at 100 mg , on in PM and one at bedtime by mouth.   You may bring your Remeron to the sleep lab to help with sleep onset. You are allowed to take 2 tabs that night.

## 2022-11-26 NOTE — Progress Notes (Signed)
Provider:  Larey Seat, MD  Primary Care Physician:  Inda Coke, [place] [address]     Referring Provider: Warden Fillers, [address],  [place] [id number]          Chief Complaint according to patient   Patient presents with:     New Patient (Initial Visit)           HISTORY OF PRESENT ILLNESS:  Catherine Mcconnell is a 77 y.o. female patient who is here for revisit 11/26/2022 for a new problem.  The patient has been followed here for OSA on CPAP and Memory concerns, but not for facial pain. The patient mentioned her concerns about facial pain in the upper eyelid and bridge of her nose to dr Katy Fitch who found no eye abnormality and suggested for her to follow here. She has cataracts. She doesn't drive.  Her daughter is driving her.   Chief concern according to patient : Left upper eyelid pain lasted one time several days around 11-25- 23 - but since she is not having any- there were no medications given. not that often and I don't know the triggers, there aren't any. Once a week or less.  Eyelid pain and pain in the left bridge of the nose, interior angle of the eye , left . Pain is present when wind blows into her eye, she tears up on the left.  She reports that fine touch while changing clothes or applying make up will not do it. No puffiness , no drooling, no nasal drip.     We discussed starting a journal. She will start gabapentin each night from tomorrow on.      Review of Systems: Out of a complete 14 system review, the patient complains of only the following symptoms, and all other reviewed systems are negative.:  Fatigue, sleepiness , snoring, OSA on CPAP.    How likely are you to doze in the following situations: 0 = not likely, 1 = slight chance, 2 = moderate chance, 3 = high chance   Sitting and Reading? Watching Television? Sitting inactive in a public place (theater or meeting)? As a passenger in  a car for an hour without a break? Lying down in the afternoon when circumstances permit? Sitting and talking to someone? Sitting quietly after lunch without alcohol? In a car, while stopped for a few minutes in traffic?   Total = 24/ 24 points while  on CPAP ?  Just got new supplies.    FSS endorsed at 47/ 63 points.   11-26-2022;  Today's CPAP download showed a drop in compliance for the months of February following water good compliance in the months of January.  Her residual AHI was very high at 18.9/h and this was attributed to high air leaks which may have been oral leaks because she used a nasal CPAP at the time.  There were also some central apneas arising.  The overall compliance was 100% for days and 43% 4 hours each night is an average of 3 hours 35 minutes.  Her CPAP is set at a pressure of 11 cm water.  She was supplied with  her machine by [facility] in [place].  Sleep she had to CPAP before we ever met so it is at least 77 years old and I think I will order an attended sleep study with a titration  here to see which mask fits her best and if she may be better served with BiPAP instead of a CPAP.    Social History   Socioeconomic History   Marital status: Divorced    Spouse name: Not on file   Number of children: 6   Years of education: some college   Highest education level: Not on file  Occupational History   Occupation: Retired   Tobacco Use   Smoking status: Former    Packs/day: 0.25    Years: 1.00    Total pack years: 0.25    Types: Cigarettes    Quit date: 1960    Years since quitting: 64.1   Smokeless tobacco: Never   Tobacco comments:    only socially in high school  Vaping Use   Vaping Use: Never used  Substance and Sexual Activity   Alcohol use: Not Currently   Drug use: Never   Sexual activity: Not on file  Other Topics Concern   Not on file  Social History Narrative   03/16/20 lives with dgtr Weber Cooks   Mother of 6    --Two children have passed, one from from prostate cancer and one from liver failure   Used to be a Quarry manager, Child psychotherapist   Recently moved back to [place] Strain: Low Risk  (10/21/2022)   Overall Financial Resource Strain (CARDIA)    Difficulty of Paying Living Expenses: Not hard at all  Food Insecurity: No Food Insecurity (10/21/2022)   Hunger Vital Sign    Worried About Running Out of Food in the Last Year: Never true    [place] in the Last Year: Never true  Transportation Needs: No Transportation Needs (10/21/2022)   PRAPARE - Hydrologist (Medical): No    Lack of Transportation (Non-Medical): No  Physical Activity: Inactive (10/21/2022)   Exercise Vital Sign    Days of Exercise per Week: 0 days    Minutes of Exercise per Session: 0 min  Stress: No Stress Concern Present (10/21/2022)   [place]    Feeling of Stress : Not at all  Social Connections: Socially Isolated (10/21/2022)   Social Connection and Isolation Panel [NHANES]    Frequency of Communication with Friends and Family: Once a week    Frequency of Social Gatherings with Friends and Family: Once a week    Attends Religious Services: 1 to 4 times per year    Active Member of [employer] or Organizations: No    Attends Music therapist: Never    Marital Status: Divorced    Family History  Problem Relation Age of Onset   Stroke Mother    Hypertension Mother    Heart disease Mother    Heart attack Father    Early death Father    Hypertension Father    Diabetes Sister    Early death Sister    Hypertension Sister    Stroke Sister    Diabetes Sister    Early death Sister    Hyperlipidemia Sister    Kidney disease Sister    Diabetes Sister    Hyperlipidemia Sister    Hypertension Sister    Diabetes Sister    Early death Brother    Arthritis Brother    Heart attack  Brother    Heart disease Brother    Arthritis Brother    Memory loss Paternal  Uncle    Depression Daughter    Hypertension Daughter    Hyperlipidemia Daughter    Prostate cancer Son    Cancer Son    Early death Son    Colon polyps Neg Hx    Colon cancer Neg Hx    Esophageal cancer Neg Hx    Rectal cancer Neg Hx    Stomach cancer Neg Hx    Sleep apnea Neg Hx     Past Medical History:  Diagnosis Date   Anemia    on meds   Arthritis    hips/wrists/back   Cataract    sx-bilateral   Chronic kidney disease    stage 3   Diabetes mellitus without complication ([place])    on meds   Hypertension    on meds   Iron deficiency    on oral iron supplements, has not required transfusion   Mixed hyperlipidemia    on meds   OSA on CPAP    compliant   Sleep apnea    uses CPAP   Thyroid nodule    removed-not on meds at this time (12/01/2020)    Past Surgical History:  Procedure Laterality Date   ABDOMINAL HYSTERECTOMY  1996   MASS EXCISION Right 2017   right arm mass removal   THYROID SURGERY     left side removal benign nodule   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   Procedure: RIGHT TOTAL HIP ARTHROPLASTY ANTERIOR APPROACH;  Surgeon: Leandrew Koyanagi, MD;  Location: [facility];  Service: Orthopedics;  Laterality: Right;  3-C     Current Outpatient Medications on File Prior to Visit  Medication Sig Dispense Refill   amLODipine (NORVASC) 10 MG tablet TAKE 1 TABLET BY MOUTH DAILY 100 tablet 2   aspirin EC 81 MG tablet Take 1 tablet (81 mg total) by mouth daily. Swallow whole. 90 tablet 3   atorvastatin (LIPITOR) 80 MG tablet Take 1 tablet by mouth every Monday, Wednesday, and Friday. 90 tablet 3   Blood Glucose Monitoring Suppl (ONE TOUCH ULTRA MINI) w/Device KIT Use to check blood sugars twice a day. 1 kit 0   carvedilol (COREG) 6.25 MG tablet TAKE 1 TABLET BY MOUTH TWICE  DAILY WITH MEALS 200 tablet 2   donepezil (ARICEPT ODT) 10 MG disintegrating tablet DISSOLVE 1 TABLET ON THE  TONGUE  AT BEDTIME 100 tablet 2   FARXIGA 10 MG TABS tablet Take 10 mg by mouth daily.     ferrous sulfate (IRON SUPPLEMENT) 325 (65 FE) MG tablet Take 1 tablet (325 mg total) by mouth 2 (two) times daily with a meal. (Patient taking differently: Take 325 mg by mouth 1 day or 1 dose.) 60 tablet 2   Lancets (ONETOUCH ULTRASOFT) lancets CHECK TWICE DAILY BEFORE  BREAKFAST AND DINNER 200 each 2   losartan (COZAAR) 50 MG tablet TAKE 1 TABLET BY MOUTH DAILY 100 tablet 2   LUMIGAN 0.01 % SOLN Place 1 drop into both eyes every evening.     mirtazapine (REMERON) 7.5 MG tablet Take 1 tablet (7.5 mg total) by mouth at bedtime. 30 tablet 1   nitroGLYCERIN (NITROSTAT) 0.4 MG SL tablet Place 1 tablet (0.4 mg total) under the tongue every 5 (five) minutes as needed for chest pain. 25 tablet 4   ONETOUCH ULTRA test strip USE TO TEST BLOOD SUGARS  TWICE DAILY 200 strip 4   Semaglutide (RYBELSUS) 7 MG TABS Take 7 mg by mouth daily. (Patient taking differently: Take 14 mg by mouth  daily.) 90 tablet 0   No current facility-administered medications on file prior to visit.    Allergies  Allergen Reactions   Lisinopril Rash   Metformin And Related Swelling     DIAGNOSTIC DATA (LABS, IMAGING, TESTING) - I reviewed patient records, labs, notes, testing and imaging myself where available.  Lab Results  Component Value Date   WBC 3.1 (L) 11/18/2022   HGB 12.0 11/18/2022   HCT 35.0 (L) 11/18/2022   MCV 89.3 11/18/2022   PLT 171.0 11/18/2022      Component Value Date/Time   NA 140 08/21/2022 0000   K 3.9 08/21/2022 0000   CL 103 08/21/2022 0000   CO2 23 (A) 08/21/2022 0000   GLUCOSE 88 08/07/2021 0504   BUN 21 08/21/2022 0000   CREATININE 1.7 (A) 08/21/2022 0000   CREATININE 1.33 (H) 08/07/2021 0504   CREATININE 1.44 (H) 06/14/2020 1353   CALCIUM 9.5 08/23/2022 0000   PROT 6.3 (L) 08/01/2021 0902   ALBUMIN 4.2 08/23/2022 0000   AST 25 02/20/2022 0000   ALT 17 08/21/2022 0000   ALKPHOS 56  02/20/2022 0000   BILITOT 1.0 08/01/2021 0902   GFRNONAA 42 (L) 08/07/2021 0504   GFRAA 38 10/16/2021 0000   Lab Results  Component Value Date   CHOL 201 (H) 06/06/2022   HDL 66 06/06/2022   LDLCALC 122 (H) 06/06/2022   TRIG 71 06/06/2022   CHOLHDL 3.0 06/06/2022   Lab Results  Component Value Date   HGBA1C 7.7 (A) 11/18/2022   Lab Results  Component Value Date   VITAMINB12 495 02/11/2020   Lab Results  Component Value Date   TSH 3.48 06/14/2020    PHYSICAL EXAM:  Today's Vitals   11/26/22 1408  BP: (!) 141/71  Pulse: (!) 53  Weight: 133 lb 6.4 oz (60.5 kg)  Height: 5' 6"$  (1.676 m)   Body mass index is 21.53 kg/m.   Wt Readings from Last 3 Encounters:  11/26/22 133 lb 6.4 oz (60.5 kg)  11/18/22 134 lb 8 oz (61 kg)  11/14/22 136 lb (61.7 kg)     Ht Readings from Last 3 Encounters:  11/26/22 5' 6"$  (1.676 m)  11/18/22 5' 6"$  (1.676 m)  11/14/22 5' 6"$  (1.676 m)      General: The patient is awake, alert and appears not in acute distress. The patient is well groomed. Head: Normocephalic, atraumatic. Neck is supple.  Mallampati 1 , wide open. No uvula seen,  neck circumference:13.5 inches . Nasal airflow fully patent.  Retrognathia is not seen.  Cardiovascular:  Regular rate and cardiac rhythm by pulse,  without distended neck veins. Respiratory: Lungs are clear to auscultation.  Skin:  Without evidence of ankle edema, or rash. Trunk: The patient's posture is erect.   NEUROLOGIC EXAM: The patient is awake and alert, oriented to place and time.   Memory subjective described as intact.  Attention span & concentration ability appears normal.  Speech is fluent,  without  dysarthria, dysphonia or aphasia.  Mood and affect are appropriate.   Cranial nerves: no loss of smell or taste reported  Pupils are equal and briskly reactive to light.   Extraocular movements in vertical and horizontal planes were intact and without nystagmus. No Diplopia. Visual fields by  finger perimetry are intact. Hearing was intact to soft voice and finger rubbing.    Facial sensation intact to fine touch. No pain was triggered.   Facial motor strength is symmetric and tongue /uvula move in midline.  Neck ROM : rotation, tilt and flexion extension were normal for age and shoulder shrug was symmetrical.    Motor exam:  Symmetric bulk, tone and ROM.   Normal tone without cog wheeling, symmetric grip strength .   Sensory:   Coordination: Rapid alternating movements in the fingers/hands were of normal speed.  The Finger-to-nose maneuver was intact without evidence of ataxia, dysmetria or tremor.   Gait and station: Patient could rise unassisted from a seated position, walked without assistive device.  Deep tendon reflexes: in the  upper and lower extremities are symmetric and intact.  Babinski response was deferred .    ASSESSMENT AND PLAN 77 y.o. 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 Seen by Dr Katy Fitch, had recent eye exam. She will start keeping a journal and start on Gabapentin at night po.  Gabapentin 200 mg at night .  2) memory not addressed today, stays on aricept.   3) OSA on CPAP with high air leak and old machine, not a well fitting mask.  Her CPAP is set at a pressure of 11 cm water.  She was supplied with  her machine by [facility] in [place].  Sleep she had to CPAP before we ever met so it is at least 77 years old and I think I will order an attended sleep study with a titration here to see which mask fits her best and if she may be better served with BiPAP instead of a CPAP.  No orders of the defined types were placed in this encounter.  Ordered Split at [facility] 10/h.   I plan to follow up either personally or through our NP within 4 -74month.   I would like to thank WInda Coke PRising Sunand GWarden Fillers [address]  [place] [id number] allowing me to meet with and to take care of this pleasant patient.   CC: I will share my notes with PCP and dr GKaty Fitch.  After spending a total time of  35  minutes face to face and additional time for physical and neurologic examination, review of laboratory studies,  personal review of imaging studies, reports and results of other testing and review of referral information / records as far as provided in visit,   Electronically signed by: CLarey Seat MD 11/26/2022 2:24 PM  [facility] and PAflac IncorporatedBoard certified by The [employer]of Sleep Medicine and Diplomate of the [employer]of Sleep Medicine. Board certified In Neurology through the [employer] Fellow of the [employer]of Neurology. Medical Director of [employer]

## 2022-11-27 MED ORDER — DEXCOM G7 SENSOR MISC: 5 refills | Status: DC

## 2022-11-29 ENCOUNTER — Telehealth: Payer: Self-pay | Admitting: *Deleted

## 2022-11-29 NOTE — Telephone Encounter (Signed)
Left message on voicemail to call office. Received fax from [place] is not covered.

## 2022-11-29 NOTE — Telephone Encounter (Signed)
Pt's daughter Ms. Isler called back, told her Dexcom G7 is not covered by insurance. Asked her if she checked with insurance if they will cover anything like Freestyle Libre? Ms. Bryan Lemma said no. Told her to contact insurance to see if they will cover anything and then let me know. Ms. Bryan Lemma verbalized understanding.

## 2022-12-03 ENCOUNTER — Telehealth: Payer: Self-pay | Admitting: Neurology

## 2022-12-03 NOTE — Telephone Encounter (Signed)
11/27/22 [employer] medicare no auth req EE  12/02/22 left VM [place]

## 2022-12-09 NOTE — Telephone Encounter (Signed)
Patient daughter called to schedule her mom SS.  Split- [employer] medicare no auth req   Patient is scheduled at [facility] for 02/17/23 at 8 pm.  Mailed packet to the patient.  Also put her on the cancellation list.

## 2022-12-14 ENCOUNTER — Other Ambulatory Visit (HOSPITAL_BASED_OUTPATIENT_CLINIC_OR_DEPARTMENT_OTHER): Payer: Self-pay

## 2022-12-14 ENCOUNTER — Ambulatory Visit (HOSPITAL_BASED_OUTPATIENT_CLINIC_OR_DEPARTMENT_OTHER)
Admission: RE | Admit: 2022-12-14 | Discharge: 2022-12-14 | Disposition: A | Discharge status: Home / Self Care | Payer: Medicare Other | Source: Ambulatory Visit | Attending: Physician Assistant | Admitting: Physician Assistant

## 2022-12-14 DIAGNOSIS — Z1231 Encounter for screening mammogram for malignant neoplasm of breast: Secondary | ICD-10-CM | POA: Insufficient documentation

## 2022-12-16 ENCOUNTER — Ambulatory Visit (INDEPENDENT_AMBULATORY_CARE_PROVIDER_SITE_OTHER): Payer: Medicare Other | Admitting: Neurology

## 2022-12-16 DIAGNOSIS — M87051 Idiopathic aseptic necrosis of right femur: Secondary | ICD-10-CM

## 2022-12-16 DIAGNOSIS — G4739 Other sleep apnea: Secondary | ICD-10-CM | POA: Diagnosis not present

## 2022-12-16 DIAGNOSIS — G4719 Other hypersomnia: Secondary | ICD-10-CM

## 2022-12-16 DIAGNOSIS — Z9989 Dependence on other enabling machines and devices: Secondary | ICD-10-CM

## 2022-12-16 DIAGNOSIS — G301 Alzheimer's disease with late onset: Secondary | ICD-10-CM

## 2022-12-16 DIAGNOSIS — G501 Atypical facial pain: Secondary | ICD-10-CM

## 2022-12-16 DIAGNOSIS — F0280 Dementia in other diseases classified elsewhere without behavioral disturbance: Secondary | ICD-10-CM

## 2022-12-16 NOTE — Telephone Encounter (Signed)
I spoke with patient daughter and informed her I had a cancellation for tonight at 9 pm. She stated that they will take it she is aware to arrive at 9 pm on 12/16/22.  She stated they received the packet in the mail and will look over it today.

## 2022-12-17 ENCOUNTER — Encounter: Payer: Self-pay | Admitting: Physician Assistant

## 2022-12-17 ENCOUNTER — Ambulatory Visit (INDEPENDENT_AMBULATORY_CARE_PROVIDER_SITE_OTHER): Payer: Medicare Other | Admitting: Physician Assistant

## 2022-12-17 VITALS — BP 130/70 | HR 56 | Temp 97.5°F | Ht 66.0 in | Wt 140.0 lb

## 2022-12-17 DIAGNOSIS — E611 Iron deficiency: Secondary | ICD-10-CM | POA: Diagnosis not present

## 2022-12-17 DIAGNOSIS — R630 Anorexia: Secondary | ICD-10-CM | POA: Diagnosis not present

## 2022-12-17 DIAGNOSIS — D72819 Decreased white blood cell count, unspecified: Secondary | ICD-10-CM

## 2022-12-17 LAB — CBC WITH DIFFERENTIAL/PLATELET
Basophils Absolute: 0 10*3/uL (ref 0.0–0.1)
Basophils Relative: 1.2 % (ref 0.0–3.0)
Eosinophils Absolute: 0.2 10*3/uL (ref 0.0–0.7)
Eosinophils Relative: 8.1 % — ABNORMAL HIGH (ref 0.0–5.0)
HCT: 33.8 % — ABNORMAL LOW (ref 36.0–46.0)
Hemoglobin: 11.5 g/dL — ABNORMAL LOW (ref 12.0–15.0)
Lymphocytes Relative: 36.8 % (ref 12.0–46.0)
Lymphs Abs: 0.9 10*3/uL (ref 0.7–4.0)
MCHC: 33.9 g/dL (ref 30.0–36.0)
MCV: 90 fl (ref 78.0–100.0)
Monocytes Absolute: 0.3 10*3/uL (ref 0.1–1.0)
Monocytes Relative: 10.3 % (ref 3.0–12.0)
Neutro Abs: 1.1 10*3/uL — ABNORMAL LOW (ref 1.4–7.7)
Neutrophils Relative %: 43.6 % (ref 43.0–77.0)
Platelets: 154 10*3/uL (ref 150.0–400.0)
RBC: 3.76 Mil/uL — ABNORMAL LOW (ref 3.87–5.11)
RDW: 13.4 % (ref 11.5–15.5)
WBC: 2.5 10*3/uL — ABNORMAL LOW (ref 4.0–10.5)

## 2022-12-17 LAB — IBC + FERRITIN
Ferritin: 275.1 ng/mL (ref 10.0–291.0)
Iron: 88 ug/dL (ref 42–145)
Saturation Ratios: 34.2 % (ref 20.0–50.0)
TIBC: 257.6 ug/dL (ref 250.0–450.0)
Transferrin: 184 mg/dL — ABNORMAL LOW (ref 212.0–360.0)

## 2022-12-17 NOTE — Patient Instructions (Signed)
It was great to see you!  Keep up the good work  Follow-up after May 5th for your A1c  Take care,  Inda Coke PA-C

## 2022-12-17 NOTE — Progress Notes (Signed)
Catherine Mcconnell is a 77 y.o. female here for a follow up of a pre-existing problem.  History of Present Illness:   Chief Complaint  Patient presents with   Weight Check   medication f/u    HPI  Poor appetite; Iron deficiency Currently taking remeron 7.5 mg nightly This has really helped her appetite -- she is eating protein and whole foods She is up 7 lb since last visit She has added back her rybelsus to 14 mg as instructed -- this has not made her appetite worse She has reduced her iron intake to daily   Wt Readings from Last 4 Encounters:  12/17/22 140 lb (63.5 kg)  11/26/22 133 lb 6.4 oz (60.5 kg)  11/18/22 134 lb 8 oz (61 kg)  11/14/22 136 lb (61.7 kg)     Past Medical History:  Diagnosis Date   Anemia    on meds   Arthritis    hips/wrists/back   Cataract    sx-bilateral   Chronic kidney disease    stage 3   Diabetes mellitus without complication ([facility])    on meds   Hypertension    on meds   Iron deficiency    on oral iron supplements, has not required transfusion   Mixed hyperlipidemia    on meds   OSA on CPAP    compliant   Sleep apnea    uses CPAP   Thyroid nodule    removed-not on meds at this time (12/01/2020)     Social History   Tobacco Use   Smoking status: Former    Packs/day: 0.25    Years: 1.00    Total pack years: 0.25    Types: Cigarettes    Quit date: 1960    Years since quitting: 64.2   Smokeless tobacco: Never   Tobacco comments:    only socially in high school  Vaping Use   Vaping Use: Never used  Substance Use Topics   Alcohol use: Not Currently   Drug use: Never    Past Surgical History:  Procedure Laterality Date   ABDOMINAL HYSTERECTOMY  1996   MASS EXCISION Right 2017   right arm mass removal   THYROID SURGERY     left side removal benign nodule   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   Procedure: RIGHT TOTAL HIP ARTHROPLASTY ANTERIOR APPROACH;  Surgeon: Leandrew Koyanagi, MD;  Location: [facility];  Service:  Orthopedics;  Laterality: Right;  3-C    Family History  Problem Relation Age of Onset   Stroke Mother    Hypertension Mother    Heart disease Mother    Heart attack Father    Early death Father    Hypertension Father    Diabetes Sister    Early death Sister    Hypertension Sister    Stroke Sister    Diabetes Sister    Early death Sister    Hyperlipidemia Sister    Kidney disease Sister    Diabetes Sister    Hyperlipidemia Sister    Hypertension Sister    Diabetes Sister    Early death Brother    Arthritis Brother    Heart attack Brother    Heart disease Brother    Arthritis Brother    Memory loss Paternal Uncle    Depression Daughter    Hypertension Daughter    Hyperlipidemia Daughter    Prostate cancer Son    Cancer Son    Early death Son    Colon  polyps Neg Hx    Colon cancer Neg Hx    Esophageal cancer Neg Hx    Rectal cancer Neg Hx    Stomach cancer Neg Hx    Sleep apnea Neg Hx     Allergies  Allergen Reactions   Lisinopril Rash   Metformin And Related Swelling    Current Medications:   Current Outpatient Medications:    amLODipine (NORVASC) 10 MG tablet, TAKE 1 TABLET BY MOUTH DAILY, Disp: 100 tablet, Rfl: 2   aspirin EC 81 MG tablet, Take 1 tablet (81 mg total) by mouth daily. Swallow whole., Disp: 90 tablet, Rfl: 3   atorvastatin (LIPITOR) 80 MG tablet, Take 1 tablet by mouth every Monday, Wednesday, and Friday., Disp: 90 tablet, Rfl: 3   Blood Glucose Monitoring Suppl (ONE TOUCH ULTRA MINI) w/Device KIT, Use to check blood sugars twice a day., Disp: 1 kit, Rfl: 0   carvedilol (COREG) 6.25 MG tablet, TAKE 1 TABLET BY MOUTH TWICE  DAILY WITH MEALS, Disp: 200 tablet, Rfl: 2   donepezil (ARICEPT ODT) 10 MG disintegrating tablet, DISSOLVE 1 TABLET ON THE TONGUE  AT BEDTIME, Disp: 100 tablet, Rfl: 2   FARXIGA 10 MG TABS tablet, Take 10 mg by mouth daily., Disp: , Rfl:    gabapentin (NEURONTIN) 100 MG capsule, Take one at dinner time and one at bedtime.,  Disp: 180 capsule, Rfl: 1   Lancets (ONETOUCH ULTRASOFT) lancets, CHECK TWICE DAILY BEFORE  BREAKFAST AND DINNER, Disp: 200 each, Rfl: 2   losartan (COZAAR) 50 MG tablet, TAKE 1 TABLET BY MOUTH DAILY, Disp: 100 tablet, Rfl: 2   LUMIGAN 0.01 % SOLN, Place 1 drop into both eyes every evening., Disp: , Rfl:    mirtazapine (REMERON) 7.5 MG tablet, Take 1 tablet (7.5 mg total) by mouth at bedtime., Disp: 30 tablet, Rfl: 1   nitroGLYCERIN (NITROSTAT) 0.4 MG SL tablet, Place 1 tablet (0.4 mg total) under the tongue every 5 (five) minutes as needed for chest pain., Disp: 25 tablet, Rfl: 4   ONETOUCH ULTRA test strip, USE TO TEST BLOOD SUGARS  TWICE DAILY, Disp: 200 strip, Rfl: 4   Semaglutide (RYBELSUS) 14 MG TABS, Take 1 tablet by mouth daily in the afternoon., Disp: , Rfl:    Review of Systems:   ROS Negative unless otherwise specified per HPI.  Vitals:   Vitals:   12/17/22 0856  BP: 130/70  Pulse: (!) 56  Temp: (!) 97.5 F (36.4 C)  TempSrc: Temporal  SpO2: 99%  Weight: 140 lb (63.5 kg)  Height: '5\' 6"'$  (1.676 m)     Body mass index is 22.6 kg/m.  Physical Exam:   Physical Exam Vitals and nursing note reviewed.  Constitutional:      General: She is not in acute distress.    Appearance: She is well-developed. She is not ill-appearing or toxic-appearing.  Cardiovascular:     Rate and Rhythm: Normal rate and regular rhythm.     Pulses: Normal pulses.     Heart sounds: Normal heart sounds, S1 normal and S2 normal.  Pulmonary:     Effort: Pulmonary effort is normal.     Breath sounds: Normal breath sounds.  Skin:    General: Skin is warm and dry.  Neurological:     Mental Status: She is alert.     GCS: GCS eye subscore is 4. GCS verbal subscore is 5. GCS motor subscore is 6.  Psychiatric:        Speech: Speech normal.  Behavior: Behavior normal. Behavior is cooperative.     Assessment and Plan:   Poor appetite Symptoms have improved Denies any concerning issues  with remeron 4.5 mg daily She would like to continue this Follow-up in 2 months when due for repeat A1c  Leukopenia, unspecified type No red flags Update blood work today to trend WBC Follow-up based on results  Inda Coke, PA-C

## 2022-12-18 ENCOUNTER — Other Ambulatory Visit: Payer: Self-pay | Admitting: Physician Assistant

## 2022-12-18 ENCOUNTER — Telehealth: Payer: Self-pay | Admitting: Nurse Practitioner

## 2022-12-18 ENCOUNTER — Other Ambulatory Visit: Payer: Self-pay | Admitting: *Deleted

## 2022-12-18 DIAGNOSIS — D72819 Decreased white blood cell count, unspecified: Secondary | ICD-10-CM

## 2022-12-18 NOTE — Telephone Encounter (Signed)
scheduled per 3/6 referral, pt has been called and confirmed date and time. Pt is aware of location and to arrive early for check in

## 2022-12-23 DIAGNOSIS — G4719 Other hypersomnia: Secondary | ICD-10-CM | POA: Insufficient documentation

## 2022-12-23 NOTE — Addendum Note (Signed)
Addended by: Larey Seat on: 12/23/2022 08:36 AM   Modules accepted: Orders

## 2022-12-23 NOTE — Procedures (Signed)
[facility] SPLIT NIGHT INTERPRETATION REPORT   STUDY DATE: 12/16/2022     PATIENT NAME:  Catherine Mcconnell, Catherine Mcconnell       DATE OF BIRTH:  10-06-1946  PATIENT ID:  [id number]    TYPE OF STUDY:  SPLIT  READING PHYSICIAN: Larey Seat, MD SCORING TECHNICIAN: Gaylyn Cheers, RPSGT   INDICATIONS: Catherine Mcconnell is a 77 y.o. AA female patient who was seen on  11/26/2022 for a new problem, referred by her ophthalmologist.  The patient has been followed here for OSA on CPAP and Memory concerns, but not for facial pain. The patient mentioned her concerns about facial pain in the upper eyelid and bridge of her nose to Dr Katy Fitch who found no eye abnormality and suggested for her to follow here. She has cataracts. She doesn't drive.  Her daughter is driving her. She remains a very sleepy individual.    Chief concern according to patient : "Left upper eyelid pain, which lasted one time several days (last onset around 11-25- 23) - but since that spell she is not having any pain again- there were no medications given." I don't know the triggers, there aren't any". Once a week or less.  Eyelid pain and pain in the left bridge of the nose, interior angle of the eye, left. Pain was present when wind blew into her eye, she teared up on the left.  A fine touch while changing clothes or applying make- up will not do it. No visible puffiness , no drooling, no nasal drip.     We discussed starting a HA journal. She will start gabapentin po each night from tomorrow on.  The Epworth Sleepiness Scale was endorsed at 24 out of 24 (scores above or equal to 10 are suggestive of hypersomnolence). FSS at   DESCRIPTION: A sleep technologist was in attendance for the duration of the recording.  Data collection, scoring, video monitoring, and reporting were performed in compliance with the AASM Manual for the Scoring of Sleep and Associated Events; (Hypopnea is scored based on the criteria listed in Section  VIII D. 1b in the AASM Manual V2.6 using a 4% oxygen desaturation rule or Hypopnea is scored based on the criteria listed in Section VIII D. 1a in the AASM Manual V2.6 using 3% oxygen desaturation and /or arousal rule).  A physician certified by the American Board of Sleep Medicine reviewed each epoch of the study.  ADDITIONAL INFORMATION:  Height: 66.0 in Weight: 133 lb (BMI 21) Neck Size: 13.5 in Medications: Norvasc, Aspirin, Lipitor, Coreg, Aricept, Farxiga, Iron, Cozaar, Lumigan, Remeron, Rybelsus  STUDY DETAILS: Lights off was at 22:03: and lights on 05:17: (434 minutes hours in bed).   Part 1 DIAGNOSTIC ANALYSIS   SLEEP CONTINUITY AND SLEEP ARCHITECTURE:  The diagnostic portion of the study began at 22:03 and ended at 01:09, for a recording time of 3h 7.67m  Total sleep time was 128 minutes (100.0% supine including 12.9% REM sleep), with a decreased sleep efficiency at 68.4%.  Sleep latency was decreased at 3.0 minutes.  REM sleep latency was normal at 57.5 minutes.   Of the total sleep time, the percentage of stage N1 sleep was 17.2%, stage N2 sleep was 69.9%, stage N3 sleep was 0.0%, and REM sleep was 12.9%. There were 2 Stage R periods observed during this portion of the study, 43 awakenings (i.e. transitions to Stage W from any sleep stage), and 117.0 total stage transitions. Wake after sleep onset (WASO)  time accounted for 56 minutes.   AROUSAL (Baseline): Arousal index was 33.3 /hr. Of these, 44.0 were identified as respiratory-related arousals (20.6 /h), 0 were PLM-related arousals (0.0 /h), and 27 were non-specific arousals (12.7 /h) and microarousals.   RESPIRATORY MONITORING:  Based on CMS criteria (using a 4% oxygen desaturation rule for scoring hypopneas), there were 164 apneas (153 obstructive; 4 central; 7 mixed), and 14 hypopneas.  The Apnea index was 30.9/h. Hypopnea index was 4.7/h.  The AHI-apnea-hypopnea index was 35.6/h overall (35.6/AHI in supine; 43.6/h REM, 43.6/h in  supine REM). There were 0 respiratory effort-related arousals (RERAs).  The RERA index was 0.0 events/h.     LIMB MOVEMENTS: There were 0 periodic limb movements of sleep (0.0/h), of which 0 (0.0/h) were associated with an arousal.   OXIMETRY: Respiratory events were associated with oxyhemoglobin desaturations (nadir 87%) from a normal baseline (mean 99%). Total time spent at, or below 88% was 6.6 minutes, or 5.2%  of total sleep time. Snoring was absent. There were 0.0 occurrences of Cheyne Stokes breathing.     EKG: Analysis of electrocardiogram activity showed the highest heart rate for the baseline portion of the study was 87.0 beats per minute.  The average heart rate during sleep was 53 bpm, while the highest heart rate for the same period was 70 bpm.            PART 2 TREATMENT ANALYSIS  SLEEP CONTINUITY AND SLEEP ARCHITECTURE:  The treatment portion of the study began at 01:09 and ended at 05:17, for a recording time of 4h 7.15mminutes.  Patient used a nasal mask at CPAP pressure of 11 cm water at home, was compliant, and then changed to a FFM Vitera here started at 5 cm water and failed CPAP at final pressure of 17 cm water. AHI exacerbated under CPAP.   The patient was changed to BiPAP and finally BiPAP ST. 17/12 cm water , RR12 /min, with a final AHI of 0.0/h.     Total sleep time was 221 minutes (100.0% supine;  0.0% lateral; 0.0% prone, 28.2% REM sleep), with a normal sleep efficiency at 89.7%. Sleep latency was decreased at 0.0 minutes. REM sleep latency was decreased at 2.5 minutes.  Arousal index was 17.9 /hr.  Of the total sleep time, the percentage of stage N1 sleep was 4.5%, stage N3 sleep was 11.1%, and REM sleep was 28.2%.  There were 3 Stage R periods observed during this portion of the study, 20 awakenings (i.e. transitions to Stage W from any sleep stage), and 59.0 total stage transitions. Wake after sleep onset (WASO) time accounted for 25 minutes.   AROUSAL:  There were 54.0 arousals in total, for an arousal index of 14.6 arousals/hour.  Of these, 52.0 were identified as respiratory-related arousals (14.1 /h), 0 were PLM-related arousals (0.0 /h), and 14 were non-specific arousals (3.8 /h) plus several microarousals.   RESPIRATORY MONITORING:    While on PAP therapy, based on CMS criteria, the apnea-hypopnea index was 27.6/h overall ( with an AHI of 27.6/h in supine; and AHI of 10.3/h in REM).     Respiratory events were associated with oxyhemoglobin desaturation (nadir 91.0%) from a mean of 99.0%.  Total time spent at, or below 88% was 0.1 minutes, or 0.0%  of total sleep time.  Snoring was absent. LIMB MOVEMENTS: There were 0 periodic limb movements of sleep (0.0/hr.   OXIMETRY: Total sleep time spent at, or below 88% was 0.0 minutes, or 0.0% of total  sleep time. Snoring was classified as .   BODY POSITION: Duration of total sleep and percent of total sleep in their respective position is as follows: supine 221 minutes .Total supine REM sleep time was 62 minutes    EKG : The electrocardiogram documented NSR. the highest heart rate for the treatment portion of the study was 90.0 beats per minute.  The average heart rate during sleep was 51 bpm, while the highest heart rate for the same period was 84 bpm.    IMPRESSION: The diagnostic part of this SPLIT protocol confirmed severe OSA at baseline. CPAP no longer controlled this female patient's OSA while sleeping in supine position. She slept 100% of the sleep study time in supine, was furbished with a FFM Vitera and responded to 17/12 cm BiPAP ST 12/m.       RECOMMENDATIONS: Start therapy with a new PAP device at above setting in BiPAP ST 17/ 12 cm water , back up rate of 12,      Larey Seat, MD    CODED DIAGNOSES:         [facility] Split Summary    General Information  Name: Catherine Mcconnell, Catherine Mcconnell BMI: [id number] Physician: Larey Seat, MD  ID:  [id number] Height: 66.0 in Technician: Gaylyn Cheers, RPSGT  Sex: Female Weight: 133.0 lb Record: [id number]  Age: 79 [1946-02-05] Date: 12/16/2022     Medical & Medication History    Catherine Mcconnell is a 77 y.o. female patient who is here for revisit 11/26/2022 for a new problem. The patient has been followed here for OSA on CPAP and Memory concerns, but not for facial pain. The patient mentioned her concerns about facial pain in the upper eyelid and bridge of her nose to dr Katy Fitch who found no eye abnormality and suggested for her to follow here. She has cataracts. She doesn't drive. Her daughter is driving her.  Norvasc, Aspirin, Lipitor, Coreg, Aricept, Farxiga, Iron, Cozaar, Lumigan, Remeron, Rybelsus   Sleep Disorder      Comments   Patient arrived for a diagnostic polysomnogram. Procedure explained and all questions answered. Patient has been a compliant CPAP user with nasal mask at 11cm. Has recently changed to a Vitera full face mask due to oral venting. Standard paste setup without complications. Patient slept supine. Mild snoring was heard. Snoring and respiratory events observed. After two hours total sleep time (TST), AHI = 38.5. CPAP was started at 5cm/H2O, with heated humidity using the medium Vitera full face mask, and increased to BiPAP ST 17/12cm/H2O, rate = 12, in an effort to control obstructive events and abolish snoring. No obvious cardiac arrhythmias noted. No significant PLMS observed. Patient had one restroom visit.   Baseline Sleep Stage Information Baseline start time: 10:03:01 PM Baseline end time: 01:09:53 AM   Time Total Supine Side Prone Upright  Recording 3h 7.322m3h 7.052mh 0.22m13m 0.22m 14m0.22m  36mep 2h 8.22m 2h91m22m 0h 79mm 0h 0322m 0h 0.39m  Late53m N1 N2 N3 REM Onset Per. Slp. Eff.  Actual 0h 3.22m 0h 3.4m19m 0.22m 64m57.4m 41m3.22m 0h26m.4m 6863m%   Stg83mr Wake N1 N2 N3 REM  Total 56.0 22.0 89.5 0.0 16.5  Supine 56.0 22.0 89.5 0.0 16.5  Side 0.0 0.0 0.0 0.0 0.0   Prone 0.0 0.0 0.0 0.0 0.0  Upright 0.0 0.0 0.0 0.0 0.0   Stg % Wake N1 N2 N3 REM  Total 30.4 17.2 69.9 0.0 12.9  Supine 30.4 17.2 69.9 0.0 12.9  Side 0.0 0.0 0.0 0.0 0.0  Prone 0.0 0.0 0.0 0.0 0.0  Upright 0.0 0.0 0.0 0.0 0.0    CPAP Sleep Stage Information CPAP start time: 01:09:53 AM CPAP end time: 05:17:20 AM   Time Total Supine Side Prone Upright  Recording (TRT) 4h 7.6m4h 7.04mh 0.8m38m 0.8m 53m0.8m  81mep (TST) 3h 41.368m 3h62m.368m 0h 68mm 0h 042m 0h 0.13m  Late61m N1 N2 N3 REM Onset Per. Slp. Eff.  Actual 0h 27.8m 0h 0.8m27m 47.8m2368m 2.368m 051m.8m 0h33m8m 89.88m   Stg26mr Wake N1 N2 N3 REM  Total 25.5 10.0 124.5 24.5 62.5  Supine 25.5 10.0 124.5 24.5 62.5  Side 0.0 0.0 0.0 0.0 0.0  Prone 0.0 0.0 0.0 0.0 0.0  Upright 0.0 0.0 0.0 0.0 0.0   Stg % Wake N1 N2 N3 REM  Total 10.3 4.5 56.2 11.1 28.2  Supine 10.3 4.5 56.2 11.1 28.2  Side 0.0 0.0 0.0 0.0 0.0  Prone 0.0 0.0 0.0 0.0 0.0  Upright 0.0 0.0 0.0 0.0 0.0    Baseline Respiratory Information Apnea Summary Sub Supine Side Prone Upright  Total 66 Total 66 66 0 0 0    REM 12 12 0 0 0    NREM 54 54 0 0 0  Obs 66 REM 12 12 0 0 0    NREM 54 54 0 0 0  Mix 0 REM 0 0 0 0 0    NREM 0 0 0 0 0  Cen 0 REM 0 0 0 0 0    NREM 0 0 0 0 0   Rera Summary Sub Supine Side Prone Upright  Total 0 Total 0 0 0 0 0    REM 0 0 0 0 0    NREM 0 0 0 0 0   Hypopnea Summary Sub Supine Side Prone Upright  Total 19 Total 19 19 0 0 0    REM 0 0 0 0 0    NREM 19 19 0 0 0   4% Hypopnea Summary Sub Supine Side Prone Upright  Total (4%) 10 Total 10 10 0 0 0    REM 0 0 0 0 0    NREM 10 10 0 0 0     AHI Total Obs Mix Cen  39.84 Apnea 30.94 30.94 0.00 0.00   Hypopnea 8.91 -- -- --  35.63 Hypopnea (4%) 4.69 -- -- --    Total Supine Side Prone Upright  Position AHI 39.84 39.84 0.00 0.00 0.00  REM AHI 43.64   NREM AHI 39.28   Position RDI 39.84 39.84 0.00 0.00 0.00  REM RDI 43.64   NREM RDI 39.28    4% Hypopnea Total Supine Side Prone  Upright  Position AHI (4%) 35.63 35.63 0.00 0.00 0.00  REM AHI (4%) 43.64   NREM AHI (4%) 34.44   Position RDI (4%) 35.63 35.63 0.00 0.00 0.00  REM RDI (4%) 43.64   NREM RDI (4%) 34.44    CPAP Respiratory Information Apnea Summary Sub Supine Side Prone Upright  Total 98 Total 98 98 0 0 0    REM 38 38 0 0 0    NREM 60 60 0 0 0  Obs 87 REM 38 38 0 0 0    NREM 49 49 0 0 0  Mix 7 REM 0 0 0 0 0    NREM 7 7 0 0 0  Cen  4 REM 0 0 0 0 0    NREM 4 4 0 0 0   Rera Summary Sub Supine Side Prone Upright  Total 0 Total 0 0 0 0 0    REM 0 0 0 0 0    NREM 0 0 0 0 0   Hypopnea Summary Sub Supine Side Prone Upright  Total 8 Total 8 8 0 0 0    REM 0 0 0 0 0    NREM 8 8 0 0 0   4% Hypopnea Summary Sub Supine Side Prone Upright  Total (4%) 4 Total 4 4 0 0 0    REM 0 0 0 0 0    NREM 4 4 0 0 0     AHI Total Obs Mix Cen  28.71 Apnea 26.55 23.57 1.90 1.08   Hypopnea 2.17 -- -- --  27.63 Hypopnea (4%) 1.08 -- -- --    Total Supine Side Prone Upright  Position AHI 28.71 28.71 0.00 0.00 0.00  REM AHI 36.48   NREM AHI 25.66   Position RDI 28.71 28.71 0.00 0.00 0.00  REM RDI 36.48   NREM RDI 25.66    4% Hypopnea Total Supine Side Prone Upright  Position AHI (4%) 27.63 27.63 0.00 0.00 0.00  REM AHI (4%) 36.48   NREM AHI (4%) 24.15   Position RDI (4%) 27.63 27.63 0.00 0.00 0.00  REM RDI (4%) 36.48   NREM RDI (4%) 24.15    Desaturation Information (Baseline)  <100% <90% <80% <70% <60% <50% <40%  Supine 55 2 0 0 0 0 0  Side 0 0 0 0 0 0 0  Prone 0 0 0 0 0 0 0  Upright 0 0 0 0 0 0 0  Total 55 2 0 0 0 0 0  Desaturation threshold setting: 4% Minimum desaturation setting: 10 seconds SaO2 nadir: 87% The longest event was a 52 sec obstructive Apneawith a minimum SaO2 of 87%. The lowest SaO2 was 87% associated with a 52 sec obstructive Apnea. Awakening/Arousal Information (Baseline) # of Awakenings 43  Wake after sleep onset 56.13m Wake after persistent sleep 41.0130m Arousal Assoc.  Arousals Index  Apneas 34 15.9  Hypopneas 10 4.7  Leg Movements 0 0.0  Snore 0.0 0.0  PTT Arousals 0 0.0  Spontaneous 27 12.7  Total 71 33.3   Desaturation Information (CPAP)  <100% <90% <80% <70% <60% <50% <40%  Supine 36 0 0 0 0 0 0  Side 0 0 0 0 0 0 0  Prone 0 0 0 0 0 0 0  Upright 0 0 0 0 0 0 0  Total 36 0 0 0 0 0 0  Desaturation threshold setting: 4% Minimum desaturation setting: 10 seconds SaO2 nadir: 91% The longest event was a 95 sec mixed Apnea with a minimum SaO2 of 92%. The lowest SaO2 was 91% associated with a 64 sec obstructive Apnea. Awakening/Arousal Information (CPAP) # of Awakenings 20  Wake after sleep onset 25.30m32make after persistent sleep 25.30m 23mrousal Assoc. Arousals Index  Apneas 47 12.7  Hypopneas 5 1.4  Leg Movements 0 0.0  Snore 0.0 0.0  PTT Arousals 0 0.0  Spontaneous 14 3.8  Total 66 17.9     EKG Rates (Baseline) EKG Avg Max Min  Awake 56 87 49  Asleep 53 70 48  EKG Events: N/A Myoclonus Information (Baseline) PLMS LMs Index  Total LMs during PLMS 0 0.0  LMs w/ Microarousals  0 0.0   LM LMs Index  w/ Microarousal 0 0.0  w/ Awakening 0 0.0  w/ Resp Event 0 0.0  Spontaneous 0 0.0  Total 0 0.0   EKG Rates (CPAP) EKG Avg Max Min  Awake 52 90 43  Asleep 51 84 45  EKG Events: N/A Myoclonus Information (CPAP) PLMS LMs Index  Total LMs during PLMS 0 0.0  LMs w/ Microarousals 0 0.0   LM LMs Index  w/ Microarousal 0 0.0  w/ Awakening 0 0.0  w/ Resp Event 0 0.0  Spontaneous 0 0.0  Total 0 0.0

## 2022-12-23 NOTE — Progress Notes (Signed)
Severe obstructive sleep apnea ( OSA) at an AHI of 35.6/h exacerbated under CPAP at various pressures ( we tried up to 17 cm water with a FFM ) . Failed BiPAP and only responded to BiPAP ST : 17/ 12 cm water 12 RR/m.

## 2022-12-24 ENCOUNTER — Telehealth: Payer: Self-pay | Admitting: Neurology

## 2022-12-24 NOTE — Telephone Encounter (Signed)
-----   Message from Larey Seat, MD sent at 12/23/2022  8:36 AM EDT ----- Severe obstructive sleep apnea ( OSA) at an AHI of 35.6/h exacerbated under CPAP at various pressures ( we tried up to 17 cm water with a FFM ) . Failed BiPAP and only responded to BiPAP ST : 17/ 12 cm water 12 RR/m.

## 2022-12-24 NOTE — Telephone Encounter (Signed)
I called pt. I advised pt that Dr. Brett Fairy reviewed their sleep study results and found that pt was best treated with BiPAP ST. Dr. Brett Fairy recommends that pt start BiPAP ST. I reviewed PAP compliance expectations with the pt. Pt is agreeable to starting a CPAP. I advised pt that an order will be sent to a DME, [employer] [facility], and [facility] [facility] will call the pt within about one week after they file with the pt's insurance. [facility] will show the pt how to use the machine, fit for masks, and troubleshoot the CPAP if needed. A follow up appt was made for insurance purposes with Ward Givens, NP on 03/17/2023 at 9 am. Pt verbalized understanding to arrive 15 minutes early and bring their CPAP. Pt verbalized understanding of results. Pt had no questions at this time but was encouraged to call back if questions arise. I have sent the order to [facility] [facility] and have received confirmation that they have received the order.

## 2022-12-26 ENCOUNTER — Other Ambulatory Visit: Payer: Medicare Other

## 2022-12-26 ENCOUNTER — Encounter: Payer: Medicare Other | Admitting: Nurse Practitioner

## 2022-12-30 NOTE — Progress Notes (Unsigned)
[place]   [phone]) [phone] Fax:([phone]) [place] consult Note   Patient Care Team: Inda Coke, [place] as PCP - General (Physician Assistant) Buford Dresser, MD as PCP - Cardiology (Cardiology) Nigel Mormon, MD as Consulting Physician (Cardiology) [name], MD as Consulting Physician (Nephrology) Minette Brine, FNP (General Practice) Dohmeier, Asencion Partridge, MD as Consulting Physician (Neurology) [facility] as Consulting Physician 12/31/2022  CHIEF COMPLAINTS/PURPOSE OF CONSULTATION:  Leukopenia, referred by Inda Coke, PA  HISTORY OF PRESENTING ILLNESS:  Catherine Mcconnell 77 y.o. female with PMH including HTN, HL, DM, CKD, OSA, and Alzheimer's dementia is here because of decreased white blood cell count.  First available CBC in epic 06/18/2011 with WBC of 3.2.  There is a large gap in CBC record until 08/13/2018 WBC 2.8, Hgb 11.7, and ANC 1.3.  She had a normal CBC with differential on 02/11/2020, at which time TSH, B12, iron and ferritin were also normal.  Hep C was nonreactive.  From 06/21/2020 through 02/20/2022 she had mild and intermittent leukopenia and neutropenia with normal hemoglobin and platelet count.  Screening colonoscopy 12/15/2020 showed nonbleeding external and internal hemorrhoids and otherwise normal. On 11/18/2022 CBC showed WBC 3.1, normal Hgb and platelet, ANC 1.6 as well as normal serum iron 113 and elevated ferritin 305.  By 12/17/2022 WBC had further decreased to 2.5 with ANC 1.1.   Socially she is divorced, 6 children in total (3 sons, 3 daughters). 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 She has mild memory issues from alzheimer's dementia, lives with her youngest daughter. Independent with ADLs but does not drive.  Up-to-date on age-appropriate cancer screenings.  No history of autoimmune disorder.  Denies alcohol, tobacco, or drug use.  Patient's son had ?bladder/pancreatic cancer, and  pt's sister's daughter (patient's niece) had a GYN malignancy.  Today she presents with her youngest daughter Catherine Mcconnell.  She has been feeling well in her normal state of health except unintentional weight loss for the past 6-12 months for total of 20 pounds.  In the first of the year she began mirtazapine and has been gaining weight.  Denies chronic or recurrent infections.  She has left foot drop, no neuropathy.  Denies pain, change in bowel habits, rectal or other bleeding, fever, night sweats, or any other new specific complaints.  She has been anemic for decades, on oral iron for many years, recently changed from twice daily to once daily.  Takes daily aspirin.    MEDICAL HISTORY:  Past Medical History:  Diagnosis Date   Anemia    on meds   Arthritis    hips/wrists/back   Cataract    sx-bilateral   Chronic kidney disease    stage 3   Diabetes mellitus without complication ([place])    on meds   Hypertension    on meds   Iron deficiency    on oral iron supplements, has not required transfusion   Mixed hyperlipidemia    on meds   OSA on CPAP    compliant   Sleep apnea    uses CPAP   Thyroid nodule    removed-not on meds at this time (12/01/2020)    SURGICAL HISTORY: Past Surgical History:  Procedure Laterality Date   ABDOMINAL HYSTERECTOMY  1996   MASS EXCISION Right 2017   right arm mass removal   THYROID SURGERY     left side removal benign nodule   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   Procedure:  RIGHT TOTAL HIP ARTHROPLASTY ANTERIOR APPROACH;  Surgeon: Leandrew Koyanagi, MD;  Location: [facility];  Service: Orthopedics;  Laterality: Right;  3-C    SOCIAL HISTORY: Social History   Socioeconomic History   Marital status: Divorced    Spouse name: Not on file   Number of children: 6   Years of education: some college   Highest education level: Not on file  Occupational History   Occupation: Retired   Tobacco Use   Smoking status: Former    Packs/day: 0.25     Years: 1.00    Additional pack years: 0.00    Total pack years: 0.25    Types: Cigarettes    Quit date: 1960    Years since quitting: 64.2   Smokeless tobacco: Never   Tobacco comments:    only socially in high school  Vaping Use   Vaping Use: Never used  Substance and Sexual Activity   Alcohol use: Not Currently   Drug use: Never   Sexual activity: Not on file  Other Topics Concern   Not on file  Social History Narrative   03/16/20 lives with dgtr Weber Cooks   Mother of 6   --Two children have passed, one from from prostate cancer and one from liver failure   Used to be a Quarry manager, Child psychotherapist   Recently moved back to [place] Strain: Low Risk  (10/21/2022)   Overall Financial Resource Strain (CARDIA)    Difficulty of Paying Living Expenses: Not hard at all  Food Insecurity: No Food Insecurity (10/21/2022)   Hunger Vital Sign    Worried About Running Out of Food in the Last Year: Never true    [place] in the Last Year: Never true  Transportation Needs: No Transportation Needs (10/21/2022)   PRAPARE - Hydrologist (Medical): No    Lack of Transportation (Non-Medical): No  Physical Activity: Inactive (10/21/2022)   Exercise Vital Sign    Days of Exercise per Week: 0 days    Minutes of Exercise per Session: 0 min  Stress: No Stress Concern Present (10/21/2022)   [place] of Stress : Not at all  Social Connections: Socially Isolated (10/21/2022)   Social Connection and Isolation Panel [NHANES]    Frequency of Communication with Friends and Family: Once a week    Frequency of Social Gatherings with Friends and Family: Once a week    Attends Religious Services: 1 to 4 times per year    Active Member of [employer] or Organizations: No    Attends Archivist Meetings: Never    Marital Status: Divorced  Human resources officer Violence:  Not At Risk (10/21/2022)   Humiliation, Afraid, Rape, and Kick questionnaire    Fear of Current or Ex-Partner: No    Emotionally Abused: No    Physically Abused: No    Sexually Abused: No    FAMILY HISTORY: Family History  Problem Relation Age of Onset   Stroke Mother    Hypertension Mother    Heart disease Mother    Heart attack Father    Early death Father    Hypertension Father    Diabetes Sister    Early death Sister    Hypertension Sister    Stroke Sister    Diabetes Sister    Early death Sister    Hyperlipidemia Sister  Kidney disease Sister    Diabetes Sister    Hyperlipidemia Sister    Hypertension Sister    Diabetes Sister    Early death Brother    Arthritis Brother    Heart attack Brother    Heart disease Brother    Arthritis Brother    Memory loss Paternal Uncle    Depression Daughter    Hypertension Daughter    Hyperlipidemia Daughter    Prostate cancer Son    Cancer Son    Early death Son    Colon polyps Neg Hx    Colon cancer Neg Hx    Esophageal cancer Neg Hx    Rectal cancer Neg Hx    Stomach cancer Neg Hx    Sleep apnea Neg Hx     ALLERGIES:  is allergic to lisinopril and metformin and related.  MEDICATIONS:  Current Outpatient Medications  Medication Sig Dispense Refill   amLODipine (NORVASC) 10 MG tablet TAKE 1 TABLET BY MOUTH DAILY 100 tablet 2   aspirin EC 81 MG tablet Take 1 tablet (81 mg total) by mouth daily. Swallow whole. 90 tablet 3   atorvastatin (LIPITOR) 80 MG tablet Take 1 tablet by mouth every Monday, Wednesday, and Friday. 90 tablet 3   Blood Glucose Monitoring Suppl (ONE TOUCH ULTRA MINI) w/Device KIT Use to check blood sugars twice a day. 1 kit 0   carvedilol (COREG) 6.25 MG tablet TAKE 1 TABLET BY MOUTH TWICE  DAILY WITH MEALS 200 tablet 2   donepezil (ARICEPT ODT) 10 MG disintegrating tablet DISSOLVE 1 TABLET ON THE TONGUE  AT BEDTIME 100 tablet 2   FARXIGA 10 MG TABS tablet Take 10 mg by mouth daily.     ferrous  sulfate 325 (65 FE) MG EC tablet Take 325 mg by mouth daily with breakfast.     gabapentin (NEURONTIN) 100 MG capsule Take one at dinner time and one at bedtime. 180 capsule 1   Lancets (ONETOUCH ULTRASOFT) lancets CHECK TWICE DAILY BEFORE  BREAKFAST AND DINNER 200 each 2   losartan (COZAAR) 50 MG tablet TAKE 1 TABLET BY MOUTH DAILY 100 tablet 2   LUMIGAN 0.01 % SOLN Place 1 drop into both eyes every evening.     mirtazapine (REMERON) 7.5 MG tablet TAKE 1 TABLET(7.5 MG) BY MOUTH AT BEDTIME 90 tablet 0   ONETOUCH ULTRA test strip USE TO TEST BLOOD SUGARS  TWICE DAILY 200 strip 4   Semaglutide (RYBELSUS) 14 MG TABS Take 1 tablet by mouth in the morning.     nitroGLYCERIN (NITROSTAT) 0.4 MG SL tablet Place 1 tablet (0.4 mg total) under the tongue every 5 (five) minutes as needed for chest pain. 25 tablet 4   No current facility-administered medications for this visit.    REVIEW OF SYSTEMS:   Constitutional: Denies fevers, chills or abnormal night sweats (+) unintentional weight loss Eyes: Denies blurriness of vision, double vision or watery eyes Ears, nose, mouth, throat, and face: Denies mucositis or sore throat Respiratory: Denies cough, dyspnea or wheezes Cardiovascular: Denies palpitation, chest discomfort or lower extremity swelling Gastrointestinal:  Denies nausea, vomiting, constipation, diarrhea, hematochezia, heartburn or change in bowel habits Skin: Denies abnormal skin rashes Lymphatics: Denies new lymphadenopathy or easy bruising or chronic infections Neurological:Denies numbness, tingling or new weaknesses (+) left foot drop/heaviness Behavioral/Psych: Mood is stable, no new changes (+) mild Alzheimer's dementia All other systems were reviewed with the patient and are negative.  PHYSICAL EXAMINATION: ECOG PERFORMANCE STATUS: 0 - Asymptomatic  Vitals:  12/31/22 1253  BP: (!) 144/68  Pulse: (!) 51  Resp: 16  Temp: 98.1 F (36.7 C)  SpO2: 100%   Filed Weights   12/31/22  1253  Weight: 142 lb 8 oz (64.6 kg)    GENERAL:alert, no distress and comfortable SKIN: No rash EYES: sclera clear NECK: without mass LYMPH:  no palpable cervical, supraclavicular, or axillary lymphadenopathy LUNGS: clear to auscultation and percussion with normal breathing effort HEART: regular rate & rhythm, no lower extremity edema ABDOMEN: abdomen soft, non-tender and normal bowel sounds Musculoskeletal:no cyanosis of digits and no clubbing  PSYCH: alert & oriented x 3 with fluent speech NEURO: no focal motor/sensory deficits  LABORATORY DATA:  I have reviewed the data as listed    Latest Ref Rng & Units 12/31/2022    1:34 PM 12/17/2022    9:56 AM 11/18/2022   10:47 AM  CBC  WBC 4.0 - 10.5 K/uL 2.6  2.5  3.1   Hemoglobin 12.0 - 15.0 g/dL 11.6  11.5  12.0   Hematocrit 36.0 - 46.0 % 34.3  33.8  35.0   Platelets 150 - 400 K/uL 180  154.0  171.0        Latest Ref Rng & Units 08/23/2022   12:00 AM 08/21/2022   12:00 AM 02/20/2022   12:00 AM  CMP  BUN 4 - 21  21     33      Creatinine 0.5 - 1.1  1.7     1.8      Sodium 137 - 147  140     138      Potassium 3.5 - 5.1 mEq/L  3.9     3.9      Chloride 99 - 108  103     100      CO2 13 - 22  23     27       Calcium 8.7 - 10.7 9.5      9.7      Alkaline Phos 25 - 125   56      AST 13 - 35   25      ALT 7 - 35 U/L  17     20         This result is from an external source.     RADIOGRAPHIC STUDIES: I have personally reviewed the radiological images as listed and agreed with the findings in the report. MM 3D SCREEN BREAST BILATERAL  Result Date: 12/17/2022 CLINICAL DATA:  Screening. EXAM: DIGITAL SCREENING BILATERAL MAMMOGRAM WITH TOMOSYNTHESIS AND CAD TECHNIQUE: Bilateral screening digital craniocaudal and mediolateral oblique mammograms were obtained. Bilateral screening digital breast tomosynthesis was performed. The images were evaluated with computer-aided detection. COMPARISON:  Previous exam(s). ACR Breast Density Category  b: There are scattered areas of fibroglandular density. FINDINGS: There are no findings suspicious for malignancy. IMPRESSION: No mammographic evidence of malignancy. A result letter of this screening mammogram will be mailed directly to the patient. RECOMMENDATION: Screening mammogram in one year. (Code:SM-B-01Y) BI-RADS CATEGORY  1: Negative. Electronically Signed   By: Everlean Alstrom M.D.   On: 12/17/2022 15:45   Split night study  Result Date: 12/16/2022 Larey Seat, MD     12/23/2022  8:31 AM [facility] SPLIT NIGHT INTERPRETATION REPORT STUDY DATE: 12/16/2022  PATIENT NAME:  Samatha, Lombera      DATE OF BIRTH:  05/30/1946 PATIENT ID:  [id number]    TYPE OF STUDY:  SPLIT READING PHYSICIAN:  Larey Seat, MD SCORING TECHNICIAN: Gaylyn Cheers, RPSGT INDICATIONS: ESTEL ALMON is a 77 y.o. AA female patient who was seen on  11/26/2022 for a new problem, referred by her ophthalmologist. The patient has been followed here for OSA on CPAP and Memory concerns, but not for facial pain. The patient mentioned her concerns about facial pain in the upper eyelid and bridge of her nose to Dr Katy Fitch who found no eye abnormality and suggested for her to follow here. She has cataracts. She doesn't drive.  Her daughter is driving her. She remains a very sleepy individual.  Chief concern according to patient : "Left upper eyelid pain, which lasted one time several days (last onset around 11-25- 23) - but since that spell she is not having any pain again- there were no medications given." I don't know the triggers, there aren't any". Once a week or less.  Eyelid pain and pain in the left bridge of the nose, interior angle of the eye, left. Pain was present when wind blew into her eye, she teared up on the left.  A fine touch while changing clothes or applying make- up will not do it. No visible puffiness , no drooling, no nasal drip.   We discussed starting a HA journal. She will start  gabapentin po each night from tomorrow on. The Epworth Sleepiness Scale was endorsed at 24 out of 24 (scores above or equal to 10 are suggestive of hypersomnolence). FSS at DESCRIPTION: A sleep technologist was in attendance for the duration of the recording.  Data collection, scoring, video monitoring, and reporting were performed in compliance with the AASM Manual for the Scoring of Sleep and Associated Events; (Hypopnea is scored based on the criteria listed in Section VIII D. 1b in the AASM Manual V2.6 using a 4% oxygen desaturation rule or Hypopnea is scored based on the criteria listed in Section VIII D. 1a in the AASM Manual V2.6 using 3% oxygen desaturation and /or arousal rule).  A physician certified by the American Board of Sleep Medicine reviewed each epoch of the study. ADDITIONAL INFORMATION:  Height: 66.0 in Weight: 133 lb (BMI 21) Neck Size: 13.5 in Medications: Norvasc, Aspirin, Lipitor, Coreg, Aricept, Farxiga, Iron, Cozaar, Lumigan, Remeron, Rybelsus STUDY DETAILS: Lights off was at 22:03: and lights on 05:17: (434 minutes hours in bed). Part 1 DIAGNOSTIC ANALYSIS  SLEEP CONTINUITY AND SLEEP ARCHITECTURE:  The diagnostic portion of the study began at 22:03 and ended at 01:09, for a recording time of 3h 7.7m.  Total sleep time was 128 minutes (100.0% supine including 12.9% REM sleep), with a decreased sleep efficiency at 68.4%. Sleep latency was decreased at 3.0 minutes. REM sleep latency was normal at 57.5 minutes.  Of the total sleep time, the percentage of stage N1 sleep was 17.2%, stage N2 sleep was 69.9%, stage N3 sleep was 0.0%, and REM sleep was 12.9%. There were 2 Stage R periods observed during this portion of the study, 43 awakenings (i.e. transitions to Stage W from any sleep stage), and 117.0 total stage transitions. Wake after sleep onset (WASO) time accounted for 56 minutes.  AROUSAL (Baseline): Arousal index was 33.3 /hr. Of these, 44.0 were identified as respiratory-related arousals  (20.6 /h), 0 were PLM-related arousals (0.0 /h), and 27 were non-specific arousals (12.7 /h) and microarousals.  RESPIRATORY MONITORING:  Based on CMS criteria (using a 4% oxygen desaturation rule for scoring hypopneas), there were 164 apneas (153 obstructive; 4 central; 7 mixed), and 14 hypopneas. The Apnea  index was 30.9/h. Hypopnea index was 4.7/h.  The AHI-apnea-hypopnea index was 35.6/h overall (35.6/AHI in supine; 43.6/h REM, 43.6/h in supine REM). There were 0 respiratory effort-related arousals (RERAs).  The RERA index was 0.0 events/h.  LIMB MOVEMENTS: There were 0 periodic limb movements of sleep (0.0/h), of which 0 (0.0/h) were associated with an arousal.  OXIMETRY: Respiratory events were associated with oxyhemoglobin desaturations (nadir 87%) from a normal baseline (mean 99%). Total time spent at, or below 88% was 6.6 minutes, or 5.2%  of total sleep time. Snoring was absent. There were 0.0 occurrences of Cheyne Stokes breathing.   EKG: Analysis of electrocardiogram activity showed the highest heart rate for the baseline portion of the study was 87.0 beats per minute.  The average heart rate during sleep was 53 bpm, while the highest heart rate for the same period was 70 bpm. PART 2 TREATMENT ANALYSIS SLEEP CONTINUITY AND SLEEP ARCHITECTURE:  The treatment portion of the study began at 01:09 and ended at 05:17, for a recording time of 4h 7.33m minutes.  Patient used a nasal mask at CPAP pressure of 11 cm water at home, was compliant, and then changed to a FFM Vitera here started at 5 cm water and failed CPAP at final pressure of 17 cm water. AHI exacerbated under CPAP.  The patient was changed to BiPAP and finally BiPAP ST. 17/12 cm water , RR12 /min, with a final AHI of 0.0/h.  Total sleep time was 221 minutes (100.0% supine;  0.0% lateral; 0.0% prone, 28.2% REM sleep), with a normal sleep efficiency at 89.7%. Sleep latency was decreased at 0.0 minutes. REM sleep latency was decreased at 2.5 minutes.   Arousal index was 17.9 /hr. Of the total sleep time, the percentage of stage N1 sleep was 4.5%, stage N3 sleep was 11.1%, and REM sleep was 28.2%. There were 3 Stage R periods observed during this portion of the study, 20 awakenings (i.e. transitions to Stage W from any sleep stage), and 59.0 total stage transitions. Wake after sleep onset (WASO) time accounted for 25 minutes.  AROUSAL: There were 54.0 arousals in total, for an arousal index of 14.6 arousals/hour.  Of these, 52.0 were identified as respiratory-related arousals (14.1 /h), 0 were PLM-related arousals (0.0 /h), and 14 were non-specific arousals (3.8 /h) plus several microarousals. RESPIRATORY MONITORING:  While on PAP therapy, based on CMS criteria, the apnea-hypopnea index was 27.6/h overall ( with an AHI of 27.6/h in supine; and AHI of 10.3/h in REM).   Respiratory events were associated with oxyhemoglobin desaturation (nadir 91.0%) from a mean of 99.0%.  Total time spent at, or below 88% was 0.1 minutes, or 0.0%  of total sleep time.  Snoring was absent. LIMB MOVEMENTS: There were 0 periodic limb movements of sleep (0.0/hr.  OXIMETRY: Total sleep time spent at, or below 88% was 0.0 minutes, or 0.0% of total sleep time. Snoring was classified as .  BODY POSITION: Duration of total sleep and percent of total sleep in their respective position is as follows: supine 221 minutes .Total supine REM sleep time was 62 minutes  EKG : The electrocardiogram documented NSR. the highest heart rate for the treatment portion of the study was 90.0 beats per minute.  The average heart rate during sleep was 51 bpm, while the highest heart rate for the same period was 84 bpm.  IMPRESSION: The diagnostic part of this SPLIT protocol confirmed severe OSA at baseline. CPAP no longer controlled this female patient's OSA while sleeping  in supine position. She slept 100% of the sleep study time in supine, was furbished with a FFM Vitera and responded to 17/12 cm BiPAP ST  12/m.   RECOMMENDATIONS: Start therapy with a new PAP device at above setting in BiPAP ST 17/ 12 cm water , back up rate of 12,  Larey Seat, MD  CODED DIAGNOSES: [facility] Split Summary  General Information Name: Faye, [place]ash[place] BMI: [id number] Physician: Larey Seat, MD ID: [id number] Height: 66.0 in Technician: Gaylyn Cheers, RPSGT Sex: Female Weight: 133.0 lb Record: [id number] Age: 39 [June 12, 1946] Date: 12/16/2022    Medical & Medication History   RAKIRA BENTHAM is a 77 y.o. female patient who is here for revisit 11/26/2022 for a new problem. The patient has been followed here for OSA on CPAP and Memory concerns, but not for facial pain. The patient mentioned her concerns about facial pain in the upper eyelid and bridge of her nose to dr Katy Fitch who found no eye abnormality and suggested for her to follow here. She has cataracts. She doesn't drive. Her daughter is driving her. Norvasc, Aspirin, Lipitor, Coreg, Aricept, Farxiga, Iron, Cozaar, Lumigan, Remeron, Rybelsus  Sleep Disorder    Comments  Patient arrived for a diagnostic polysomnogram. Procedure explained and all questions answered. Patient has been a compliant CPAP user with nasal mask at 11cm. Has recently changed to a Vitera full face mask due to oral venting. Standard paste setup without complications. Patient slept supine. Mild snoring was heard. Snoring and respiratory events observed. After two hours total sleep time (TST), AHI = 38.5. CPAP was started at 5cm/H2O, with heated humidity using the medium Vitera full face mask, and increased to BiPAP ST 17/12cm/H2O, rate = 12, in an effort to control obstructive events and abolish snoring. No obvious cardiac arrhythmias noted. No significant PLMS observed. Patient had one restroom visit.  Baseline Sleep Stage Information Baseline start time: 10:03:01 PM Baseline end time: 01:09:53 AM Time Total Supine Side Prone Upright Recording 3h 7.41m 3h 7.66m 0h 0.20m 0h 0.61m  0h 0.75m Sleep 2h 8.24m 2h 8.55m 0h 0.62m 0h 0.20m 0h 0.88m Latency N1 N2 N3 REM Onset Per. Slp. Eff. Actual 0h 3.74m 0h 3.7m 0h 0.67m 0h 57.6m 0h 3.80m 0h 57.23m 68.45% Stg Dur Wake N1 N2 N3 REM Total 56.0 22.0 89.5 0.0 16.5 Supine 56.0 22.0 89.5 0.0 16.5 Side 0.0 0.0 0.0 0.0 0.0 Prone 0.0 0.0 0.0 0.0 0.0 Upright 0.0 0.0 0.0 0.0 0.0  Stg % Wake N1 N2 N3 REM Total 30.4 17.2 69.9 0.0 12.9 Supine 30.4 17.2 69.9 0.0 12.9 Side 0.0 0.0 0.0 0.0 0.0 Prone 0.0 0.0 0.0 0.0 0.0 Upright 0.0 0.0 0.0 0.0 0.0  CPAP Sleep Stage Information CPAP start time: 01:09:53 AM CPAP end time: 05:17:20 AM Time Total Supine Side Prone Upright Recording (TRT) 4h 7.84m 4h 7.47m 0h 0.71m 0h 0.68m 0h 0.11m Sleep (TST) 3h 41.51m 3h 41.9m 0h 0.46m 0h 0.29m 0h 0.60m Latency N1 N2 N3 REM Onset Per. Slp. Eff. Actual 0h 27.29m 0h 0.60m 0h 47.20m 0h 2.24m 0h 0.70m 0h 0.31m 89.68% Stg Dur Wake N1 N2 N3 REM Total 25.5 10.0 124.5 24.5 62.5 Supine 25.5 10.0 124.5 24.5 62.5 Side 0.0 0.0 0.0 0.0 0.0 Prone 0.0 0.0 0.0 0.0 0.0 Upright 0.0 0.0 0.0 0.0 0.0  Stg % Wake N1 N2 N3 REM Total 10.3 4.5 56.2 11.1 28.2 Supine 10.3 4.5 56.2 11.1 28.2 Side 0.0 0.0 0.0 0.0 0.0 Prone 0.0 0.0 0.0 0.0  0.0 Upright 0.0 0.0 0.0 0.0 0.0  Baseline Respiratory Information Apnea Summary Sub Supine Side Prone Upright Total 66 Total 66 66 0 0 0   REM 12 12 0 0 0   NREM 54 54 0 0 0 Obs 66 REM 12 12 0 0 0   NREM 54 54 0 0 0 Mix 0 REM 0 0 0 0 0   NREM 0 0 0 0 0 Cen 0 REM 0 0 0 0 0   NREM 0 0 0 0 0 Rera Summary Sub Supine Side Prone Upright Total 0 Total 0 0 0 0 0   REM 0 0 0 0 0   NREM 0 0 0 0 0  Hypopnea Summary Sub Supine Side Prone Upright Total 19 Total 19 19 0 0 0   REM 0 0 0 0 0   NREM 19 19 0 0 0 4% Hypopnea Summary Sub Supine Side Prone Upright Total (4%) 10 Total 10 10 0 0 0   REM 0 0 0 0 0   NREM 10 10 0 0 0  AHI Total Obs Mix Cen 39.84 Apnea 30.94 30.94 0.00 0.00  Hypopnea 8.91 -- -- -- 35.63 Hypopnea (4%) 4.69 -- -- --  Total Supine Side Prone Upright Position AHI 39.84 39.84 0.00 0.00 0.00 REM AHI  43.64  NREM AHI 39.28  Position RDI 39.84 39.84 0.00 0.00 0.00 REM RDI 43.64  NREM RDI 39.28  4% Hypopnea Total Supine Side Prone Upright Position AHI (4%) 35.63 35.63 0.00 0.00 0.00 REM AHI (4%) 43.64  NREM AHI (4%) 34.44  Position RDI (4%) 35.63 35.63 0.00 0.00 0.00 REM RDI (4%) 43.64  NREM RDI (4%) 34.44  CPAP Respiratory Information Apnea Summary Sub Supine Side Prone Upright Total 98 Total 98 98 0 0 0   REM 38 38 0 0 0   NREM 60 60 0 0 0 Obs 87 REM 38 38 0 0 0   NREM 49 49 0 0 0 Mix 7 REM 0 0 0 0 0   NREM 7 7 0 0 0 Cen 4 REM 0 0 0 0 0   NREM 4 4 0 0 0 Rera Summary Sub Supine Side Prone Upright Total 0 Total 0 0 0 0 0   REM 0 0 0 0 0   NREM 0 0 0 0 0  Hypopnea Summary Sub Supine Side Prone Upright Total 8 Total 8 8 0 0 0   REM 0 0 0 0 0   NREM 8 8 0 0 0 4% Hypopnea Summary Sub Supine Side Prone Upright Total (4%) 4 Total 4 4 0 0 0   REM 0 0 0 0 0   NREM 4 4 0 0 0  AHI Total Obs Mix Cen 28.71 Apnea 26.55 23.57 1.90 1.08  Hypopnea 2.17 -- -- -- 27.63 Hypopnea (4%) 1.08 -- -- --  Total Supine Side Prone Upright Position AHI 28.71 28.71 0.00 0.00 0.00 REM AHI 36.48  NREM AHI 25.66  Position RDI 28.71 28.71 0.00 0.00 0.00 REM RDI 36.48  NREM RDI 25.66  4% Hypopnea Total Supine Side Prone Upright Position AHI (4%) 27.63 27.63 0.00 0.00 0.00 REM AHI (4%) 36.48  NREM AHI (4%) 24.15  Position RDI (4%) 27.63 27.63 0.00 0.00 0.00 REM RDI (4%) 36.48  NREM RDI (4%) 24.15  Desaturation Information (Baseline)  <100% <90% <80% <70% <60% <50% <40% Supine 55 2 0 0 0 0 0 Side 0 0 0 0 0 0 0  Prone 0 0 0 0 0 0 0 Upright 0 0 0 0 0 0 0 Total 55 2 0 0 0 0 0 Desaturation threshold setting: 4% Minimum desaturation setting: 10 seconds SaO2 nadir: 87% The longest event was a 52 sec obstructive Apneawith a minimum SaO2 of 87%. The lowest SaO2 was 87% associated with a 52 sec obstructive Apnea. Awakening/Arousal Information (Baseline) # of Awakenings 43 Wake after sleep onset 56.55m Wake after persistent sleep 41.63m Arousal Assoc. Arousals  Index Apneas 34 15.9 Hypopneas 10 4.7 Leg Movements 0 0.0 Snore 0.0 0.0 PTT Arousals 0 0.0 Spontaneous 27 12.7 Total 71 33.3  Desaturation Information (CPAP)  <100% <90% <80% <70% <60% <50% <40% Supine 36 0 0 0 0 0 0 Side 0 0 0 0 0 0 0 Prone 0 0 0 0 0 0 0 Upright 0 0 0 0 0 0 0 Total 36 0 0 0 0 0 0 Desaturation threshold setting: 4% Minimum desaturation setting: 10 seconds SaO2 nadir: 91% The longest event was a 95 sec mixed Apnea with a minimum SaO2 of 92%. The lowest SaO2 was 91% associated with a 64 sec obstructive Apnea. Awakening/Arousal Information (CPAP) # of Awakenings 20 Wake after sleep onset 25.36m Wake after persistent sleep 25.70m Arousal Assoc. Arousals Index Apneas 47 12.7 Hypopneas 5 1.4 Leg Movements 0 0.0 Snore 0.0 0.0 PTT Arousals 0 0.0 Spontaneous 14 3.8 Total 66 17.9  EKG Rates (Baseline) EKG Avg Max Min Awake 56 87 49 Asleep 53 70 48 EKG Events: N/A Myoclonus Information (Baseline) PLMS LMs Index Total LMs during PLMS 0 0.0 LMs w/ Microarousals 0 0.0 LM LMs Index w/ Microarousal 0 0.0 w/ Awakening 0 0.0 w/ Resp Event 0 0.0 Spontaneous 0 0.0 Total 0 0.0  EKG Rates (CPAP) EKG Avg Max Min Awake 52 90 43 Asleep 51 84 45 EKG Events: N/A Myoclonus Information (CPAP) PLMS LMs Index Total LMs during PLMS 0 0.0 LMs w/ Microarousals 0 0.0 LM LMs Index w/ Microarousal 0 0.0 w/ Awakening 0 0.0 w/ Resp Event 0 0.0 Spontaneous 0 0.0 Total 0 0.0     ASSESSMENT & PLAN: 77 year old female  Leukopenia/neutropenia -I reviewed her medical record in detail with the patient and daughter. 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 No recent med changes. HCV and B12 normal in the past. No true IDA but has been on oral iron for many years -We reviewed common etiologies of leukopenia.  -Will check autoimmune markers and r/o infectious and nutritional causes -Given the mild and intermittent nature, and no other cytopenias, this  is unlikely primary bone marrow disorder -If work up is unrevealing, we discussed this could be cyclical neutropenia or benign ethnic neutropenia  -Ms. Shenton appears well. Exam is unremarkable.  -Today's labs show WBC 2.6, ANC 1.4 (up from 1.1 a month ago), and hgb 11.6 -She does not require medical treatment, however this should be monitored periodically.  -I will call her with results, but more likely she can be followed by her PCP.  -We can see her back in the future if she has worsening leukopenia/neutropenia, recurrent infections, or other cytopenias -Pt seen with Dr. Burr Medico   Normocytic anemia -CBC normal in 2012, she has had mild intermittent anemia since at least 2019, Hgb 11-13 range -Has been on oral iron for many years, recently decreased from BID to once daily  -Recent ferritin 305, possibly possibly from iron replacement vs anemia of  chronic disease -Today's Hgb 11.6  CKD, HTN, HL, DM, OSA -SCr 1.3 - 1.8 -On amlodipine, aspirin, atorvastatin, carvedilol, Farxiga, losartan, and mirtazapine for unintentional weight loss which has been effective -She has been prescribed a CPAP -Per PCP  Cognitive, social -Mild, dependent with ADLs but does not drive -Patient lives with her daughter Catherine Mcconnell -On Donepezil and gabapentin  -Followed by neuro and PCP   Health maintenance -S/P hysterectomy, up-to-date on mammogram and colonoscopy -Family history of ?bladder and/or pancreatic cancer and GYN malignancy -Her daughter understands her risk and is up-to-date on screenings.  She will qualify for genetic testing if interested   PLAN: -Medical record reviewed -Lab today, will call with results and recommendations -F/up pending results -Chronic conditions per PCP, Neuro, cardiology, nephrology    Orders Placed This Encounter  Procedures   CBC with Differential ([facility] Only)    Standing Status:   Future    Number of Occurrences:   1    Standing Expiration Date:    12/31/2023   Sedimentation rate    Standing Status:   Future    Number of Occurrences:   1    Standing Expiration Date:   12/31/2023   Technologist smear review    Standing Status:   Future    Number of Occurrences:   1    Standing Expiration Date:   12/31/2023    Order Specific Question:   Clinical information:    Answer:   leukopenia/neutropenia   C-reactive protein    Standing Status:   Future    Number of Occurrences:   1    Standing Expiration Date:   12/31/2023   Folate RBC    Standing Status:   Future    Number of Occurrences:   1    Standing Expiration Date:   12/31/2023   Vitamin B12    Standing Status:   Future    Number of Occurrences:   1    Standing Expiration Date:   12/31/2023   ANA, IFA (with reflex)    Standing Status:   Future    Number of Occurrences:   1    Standing Expiration Date:   12/31/2023   Hepatitis A antibody, total    Standing Status:   Future    Number of Occurrences:   1    Standing Expiration Date:   12/31/2023   Hepatitis B core antibody, total    Standing Status:   Future    Number of Occurrences:   1    Standing Expiration Date:   12/31/2023   HIV antibody (with reflex)    Standing Status:   Future    Number of Occurrences:   1    Standing Expiration Date:   12/31/2023      All questions were answered. The patient knows to call the clinic with any problems, questions or concerns.      Alla Feeling, NP 12/31/22   Addendum I have seen the patient, examined her. I agree with the assessment and and plan and have edited the notes.   77 yo female with PMH of hypertension, diabetes, CKD, OSA, and Alzheimer's dementia, was referred for mild neutropenia.  She has had intermittent neutropenia since 2012, and did have normal WBC and ANC sometimes in the past.  Previous hepatitis C was negative, B12, TSH, iron study were all normal.  She has not had significant recurrent infection lately.  We discussed the common etiology of mild neutropenia,  including congenital,  cyclic, autoimmune related neutropenia, or ethnic (especially in African-American people), and primary bone marrow disease such as MDS.  Given the chronic course and intermittent neutropenia, I have very low suspicion for MDS, I do not think she needs a bone marrow biopsy.  Will repeat her CBC, B12, folate, and check hepatitis B, HIV, CRP, ANA, today, and call her with results.  If the above results are normal, we will see her as needed in the future.  All questions were answered.  Truitt Merle MD 12/31/2022

## 2022-12-31 ENCOUNTER — Inpatient Hospital Stay: Payer: Medicare Other | Attending: Nurse Practitioner

## 2022-12-31 ENCOUNTER — Inpatient Hospital Stay (HOSPITAL_BASED_OUTPATIENT_CLINIC_OR_DEPARTMENT_OTHER): Payer: Medicare Other | Admitting: Nurse Practitioner

## 2022-12-31 ENCOUNTER — Encounter: Payer: Self-pay | Admitting: Nurse Practitioner

## 2022-12-31 VITALS — BP 144/68 | HR 51 | Temp 98.1°F | Resp 16 | Ht 67.0 in | Wt 142.5 lb

## 2022-12-31 DIAGNOSIS — E118 Type 2 diabetes mellitus with unspecified complications: Secondary | ICD-10-CM

## 2022-12-31 DIAGNOSIS — I129 Hypertensive chronic kidney disease with stage 1 through stage 4 chronic kidney disease, or unspecified chronic kidney disease: Secondary | ICD-10-CM

## 2022-12-31 DIAGNOSIS — G4733 Obstructive sleep apnea (adult) (pediatric): Secondary | ICD-10-CM | POA: Insufficient documentation

## 2022-12-31 DIAGNOSIS — N189 Chronic kidney disease, unspecified: Secondary | ICD-10-CM | POA: Insufficient documentation

## 2022-12-31 DIAGNOSIS — D709 Neutropenia, unspecified: Secondary | ICD-10-CM | POA: Insufficient documentation

## 2022-12-31 DIAGNOSIS — D649 Anemia, unspecified: Secondary | ICD-10-CM

## 2022-12-31 LAB — CBC WITH DIFFERENTIAL (CANCER CENTER ONLY)
Abs Immature Granulocytes: 0 10*3/uL (ref 0.00–0.07)
Basophils Absolute: 0 10*3/uL (ref 0.0–0.1)
Basophils Relative: 1 %
Eosinophils Absolute: 0.2 10*3/uL (ref 0.0–0.5)
Eosinophils Relative: 8 %
HCT: 34.3 % — ABNORMAL LOW (ref 36.0–46.0)
Hemoglobin: 11.6 g/dL — ABNORMAL LOW (ref 12.0–15.0)
Immature Granulocytes: 0 %
Lymphocytes Relative: 31 %
Lymphs Abs: 0.8 10*3/uL (ref 0.7–4.0)
MCH: 30.9 pg (ref 26.0–34.0)
MCHC: 33.8 g/dL (ref 30.0–36.0)
MCV: 91.2 fL (ref 80.0–100.0)
Monocytes Absolute: 0.2 10*3/uL (ref 0.1–1.0)
Monocytes Relative: 8 %
Neutro Abs: 1.4 10*3/uL — ABNORMAL LOW (ref 1.7–7.7)
Neutrophils Relative %: 52 %
Platelet Count: 180 10*3/uL (ref 150–400)
RBC: 3.76 MIL/uL — ABNORMAL LOW (ref 3.87–5.11)
RDW: 12.2 % (ref 11.5–15.5)
WBC Count: 2.6 10*3/uL — ABNORMAL LOW (ref 4.0–10.5)
nRBC: 0 % (ref 0.0–0.2)

## 2022-12-31 LAB — HEPATITIS A ANTIBODY, TOTAL: hep A Total Ab: NONREACTIVE

## 2022-12-31 LAB — VITAMIN B12: Vitamin B-12: 238 pg/mL (ref 180–914)

## 2022-12-31 LAB — TECHNOLOGIST SMEAR REVIEW

## 2022-12-31 LAB — SEDIMENTATION RATE: Sed Rate: 5 mm/hr (ref 0–22)

## 2022-12-31 LAB — C-REACTIVE PROTEIN: CRP: 0.9 mg/dL (ref <1.0)

## 2022-12-31 LAB — HIV ANTIBODY (ROUTINE TESTING W REFLEX): HIV Screen 4th Generation wRfx: NONREACTIVE

## 2022-12-31 LAB — HEPATITIS B CORE ANTIBODY, TOTAL: Hep B Core Total Ab: NONREACTIVE

## 2023-01-01 LAB — FOLATE RBC
Folate, Hemolysate: 331 ng/mL
Folate, RBC: 935 ng/mL (ref >498)
Hematocrit: 35.4 % (ref 34.0–46.6)

## 2023-01-01 LAB — ANTINUCLEAR ANTIBODIES, IFA: ANA Ab, IFA: NEGATIVE

## 2023-01-08 ENCOUNTER — Other Ambulatory Visit: Payer: Self-pay | Admitting: Nurse Practitioner

## 2023-01-08 ENCOUNTER — Encounter: Payer: Self-pay | Admitting: Nurse Practitioner

## 2023-01-08 ENCOUNTER — Encounter: Payer: Self-pay | Admitting: Physician Assistant

## 2023-01-08 DIAGNOSIS — D709 Neutropenia, unspecified: Secondary | ICD-10-CM

## 2023-01-08 NOTE — Telephone Encounter (Signed)
Please see message and advise if pt needs to see Endo?

## 2023-01-10 ENCOUNTER — Telehealth: Payer: Self-pay | Admitting: Hematology

## 2023-01-10 NOTE — Telephone Encounter (Signed)
Contacted patient to scheduled appointments. Patient is aware of appointments that are scheduled.   

## 2023-01-13 ENCOUNTER — Ambulatory Visit: Payer: Medicare Other | Admitting: Internal Medicine

## 2023-01-13 DIAGNOSIS — G4731 Primary central sleep apnea: Secondary | ICD-10-CM | POA: Diagnosis not present

## 2023-01-15 ENCOUNTER — Inpatient Hospital Stay: Payer: Medicare Other | Attending: Nurse Practitioner

## 2023-01-15 DIAGNOSIS — D709 Neutropenia, unspecified: Secondary | ICD-10-CM

## 2023-01-15 DIAGNOSIS — E538 Deficiency of other specified B group vitamins: Secondary | ICD-10-CM | POA: Diagnosis not present

## 2023-01-15 MED ORDER — CYANOCOBALAMIN 1000 MCG/ML IJ SOLN
1000.0000 ug | Freq: Once | INTRAMUSCULAR | Status: AC
  Administered 2023-01-15: 1000 ug via INTRAMUSCULAR
  Filled 2023-01-15: qty 1

## 2023-01-22 ENCOUNTER — Other Ambulatory Visit: Payer: Self-pay

## 2023-01-22 ENCOUNTER — Inpatient Hospital Stay: Payer: Medicare Other

## 2023-01-22 DIAGNOSIS — E538 Deficiency of other specified B group vitamins: Secondary | ICD-10-CM | POA: Diagnosis not present

## 2023-01-22 DIAGNOSIS — D709 Neutropenia, unspecified: Secondary | ICD-10-CM

## 2023-01-22 MED ORDER — CYANOCOBALAMIN 1000 MCG/ML IJ SOLN
1000.0000 ug | Freq: Once | INTRAMUSCULAR | Status: AC
  Administered 2023-01-22: 1000 ug via INTRAMUSCULAR
  Filled 2023-01-22: qty 1

## 2023-01-29 ENCOUNTER — Other Ambulatory Visit: Payer: Self-pay

## 2023-01-29 ENCOUNTER — Inpatient Hospital Stay: Payer: Medicare Other

## 2023-01-29 DIAGNOSIS — E538 Deficiency of other specified B group vitamins: Secondary | ICD-10-CM | POA: Diagnosis not present

## 2023-01-29 DIAGNOSIS — D709 Neutropenia, unspecified: Secondary | ICD-10-CM

## 2023-01-29 MED ORDER — CYANOCOBALAMIN 1000 MCG/ML IJ SOLN
1000.0000 ug | Freq: Once | INTRAMUSCULAR | Status: AC
  Administered 2023-01-29: 1000 ug via INTRAMUSCULAR
  Filled 2023-01-29: qty 1

## 2023-02-03 ENCOUNTER — Other Ambulatory Visit: Payer: Self-pay | Admitting: Neurology

## 2023-02-04 DIAGNOSIS — N390 Urinary tract infection, site not specified: Secondary | ICD-10-CM | POA: Diagnosis not present

## 2023-02-04 DIAGNOSIS — N1832 Chronic kidney disease, stage 3b: Secondary | ICD-10-CM | POA: Diagnosis not present

## 2023-02-05 ENCOUNTER — Other Ambulatory Visit: Payer: Self-pay

## 2023-02-05 ENCOUNTER — Inpatient Hospital Stay: Payer: Medicare Other

## 2023-02-05 DIAGNOSIS — D709 Neutropenia, unspecified: Secondary | ICD-10-CM

## 2023-02-05 DIAGNOSIS — E538 Deficiency of other specified B group vitamins: Secondary | ICD-10-CM | POA: Diagnosis not present

## 2023-02-05 MED ORDER — CYANOCOBALAMIN 1000 MCG/ML IJ SOLN
1000.0000 ug | Freq: Once | INTRAMUSCULAR | Status: AC
  Administered 2023-02-05: 1000 ug via INTRAMUSCULAR
  Filled 2023-02-05: qty 1

## 2023-02-11 NOTE — Progress Notes (Signed)
Cardiology Office Note:    Date:  02/12/2023   ID:  Catherine Mcconnell, DOB 09/27/1946, MRN [id number]  PCP:  Jarold Motto, PA  Cardiologist:  Jodelle Red, MD  Referring MD: Jarold Motto, PA   CC: follow up  History of Present Illness:    Catherine Mcconnell is a 77 y.o. female with a hx of hypertension, OSA on CPAP, type II diabetes, chronic kidney disease stage 3a, mixed hyperlipidemia, who is seen for follow up. Previously seen as a new consult at the request of Jarold Motto, [place] for the evaluation and management of hypertension.  She was accompanied by her daughter today, who is also my patient. She is a retired [employer] consultant.   Cardiac history: hypertension "for decades." Hypercholesterolemia, but does not tolerate daily statin due to myalgia. Type II diabetes for >20 years. Chronic kidney disease, follows with Dr. Signe Colt. Calcium score very elevated but asymptomatic. Pending [id number].  At her 07/2022 visit she was doing well. We reviewed her PharmD visit and her calcium score results. She was active without symptoms. Discussed options for further evaluation. Her blood pressure in clinic was 150/72. Her daughter reports that she had not taken her blood pressure medication yet. Upon recheck, her blood pressure improved to 134/70. She underwent ETT 08/2022 which was very reassuring. Blood pressure increased with exercise, which may be why she felt tired/SOB with exercise. Overall no evidence of a blockage as the reason for limitation. Planned to work on controlling BP and gradually increase exercise as she is able.  At her last appointment, she felt well and activity was only limited by the cold weather. Her daughter expressed concerns that she would doze off in the middle of conversations. No changes made.   Today, she is accompanied by her daughter. She complains of frequent muscle cramps in her right calf. She denies any cramping in her left calf. May occur at various times,  sometimes as soon as she gets out of bed. Typically she rubs the affected area until her symptoms subside.  She continues to work on dietary changes, including not eating as much meat in favor of vegetables, fruits, and nuts. We reviewed her last lipid panel 05/2022 showing HDL 66 and LDL 122. She has yet to receive any Repatha, she has been unable to start this. Her daughter reports that there have been communication issues with applying to the [employer].   She denies any palpitations, chest pain, shortness of breath, or peripheral edema. No lightheadedness, headaches, syncope, orthopnea, or PND.   Past Medical History:  Diagnosis Date   Anemia    on meds   Arthritis    hips/wrists/back   Cataract    sx-bilateral   Chronic kidney disease    stage 3   Diabetes mellitus without complication (HCC)    on meds   Hypertension    on meds   Iron deficiency    on oral iron supplements, has not required transfusion   Mixed hyperlipidemia    on meds   OSA on CPAP    compliant   Sleep apnea    uses CPAP   Thyroid nodule    removed-not on meds at this time (12/01/2020)    Past Surgical History:  Procedure Laterality Date   ABDOMINAL HYSTERECTOMY  1996   MASS EXCISION Right 2017   right arm mass removal   THYROID SURGERY     left side removal benign nodule   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  Procedure: RIGHT TOTAL HIP ARTHROPLASTY ANTERIOR APPROACH;  Surgeon: Tarry Kos, MD;  Location: [facility] OR;  Service: Orthopedics;  Laterality: Right;  3-C    Current Medications: Current Outpatient Medications on File Prior to Visit  Medication Sig   amLODipine (NORVASC) 10 MG tablet TAKE 1 TABLET BY MOUTH DAILY   aspirin EC 81 MG tablet Take 1 tablet (81 mg total) by mouth daily. Swallow whole.   atorvastatin (LIPITOR) 80 MG tablet Take 1 tablet by mouth every Monday, Wednesday, and Friday.   Blood Glucose Monitoring Suppl (ONE TOUCH ULTRA MINI) w/Device KIT Use to  check blood sugars twice a day.   carvedilol (COREG) 6.25 MG tablet TAKE 1 TABLET BY MOUTH TWICE  DAILY WITH MEALS   donepezil (ARICEPT ODT) 10 MG disintegrating tablet DISSOLVE 1 TABLET ON THE TONGUE  AT BEDTIME   FARXIGA 10 MG TABS tablet Take 10 mg by mouth daily.   ferrous sulfate 325 (65 FE) MG EC tablet Take 325 mg by mouth daily with breakfast.   gabapentin (NEURONTIN) 100 MG capsule Take 200 mg by mouth at bedtime.   Lancets (ONETOUCH ULTRASOFT) lancets CHECK TWICE DAILY BEFORE  BREAKFAST AND DINNER   losartan (COZAAR) 50 MG tablet TAKE 1 TABLET BY MOUTH DAILY   LUMIGAN 0.01 % SOLN Place 1 drop into both eyes every evening.   mirtazapine (REMERON) 7.5 MG tablet TAKE 1 TABLET(7.5 MG) BY MOUTH AT BEDTIME   nitroGLYCERIN (NITROSTAT) 0.4 MG SL tablet Place 1 tablet (0.4 mg total) under the tongue every 5 (five) minutes as needed for chest pain.   ONETOUCH ULTRA test strip USE TO TEST BLOOD SUGARS  TWICE DAILY   Semaglutide (RYBELSUS) 14 MG TABS Take 1 tablet by mouth in the morning.   No current facility-administered medications on file prior to visit.     Allergies:   Lisinopril and Metformin and related   Social History   Tobacco Use   Smoking status: Former    Packs/day: 0.25    Years: 1.00    Additional pack years: 0.00    Total pack years: 0.25    Types: Cigarettes    Quit date: 1960    Years since quitting: 64.3   Smokeless tobacco: Never   Tobacco comments:    only socially in high school  Vaping Use   Vaping Use: Never used  Substance Use Topics   Alcohol use: Not Currently   Drug use: Never    Family History: family history includes Arthritis in her brother and brother; Cancer in her niece and son; Depression in her daughter; Diabetes in her sister, sister, sister, and sister; Early death in her brother, father, sister, sister, and son; Heart attack in her brother and father; Heart disease in her brother and mother; Hyperlipidemia in her daughter, sister, and  sister; Hypertension in her daughter, father, mother, sister, and sister; Kidney disease in her sister; Memory loss in her paternal uncle; Prostate cancer in her son; Stroke in her mother and sister. There is no history of Colon polyps, Colon cancer, Esophageal cancer, Rectal cancer, Stomach cancer, or Sleep apnea.  ROS:   Please see the history of present illness.   (+) Muscle cramps of right calf Additional pertinent ROS otherwise unremarkable.  EKGs/Labs/Other Studies Reviewed:    The following studies were reviewed today:  Exercise Tolerance Test  08/26/2022:   Exercise capacity was moderately impaired. Patient exercised for 5 min and 1 sec. Maximum HR of 157 bpm. MPHR 109.0 %. Peak  METS 7.0 . The patient experienced no angina during the test. The test was stopped because the patient experienced fatigue. Hypertensive blood pressure and normal heart rate response noted during stress. Heart rate recovery was normal.   Arrhythmias during stress: rare PVCs. ECG was interpretable and conclusive. The ECG was negative for ischemia.   This is a low risk study.  Calcium score 07/12/22: Coronary Calcium Score:  Left main: 71.3  Left anterior descending artery: 2580  Left circumflex artery: 0  Right coronary artery: 302   Total: 2953  Percentile: 99th   Aorta: Borderline dilated to 39 mm at the level of the main PA bifurcation. Aortic atherosclerosis.  Right LE Venous Doppler  02/16/2022: IMPRESSION: No evidence of RIGHT LOWER extremity DVT.  Echocardiogram  06/12/2021: Left ventricle cavity is normal in size. Mild concentric hypertrophy of  the left ventricle. Normal global wall motion. Normal LV systolic function  with EF 60%. Doppler evidence of grade I (impaired) diastolic dysfunction,  normal LAP.  Structurally normal trileaflet aortic valve. Mild (Grade I) aortic  regurgitation.  No evidence of pulmonary hypertension.  EKG:  EKG is personally reviewed.   02/12/2023:  EKG was not  ordered. 11/14/2022:  EKG was not ordered. 08/12/22: not ordered  05/30/2022:  sinus bradycardia at 57 bpm, borderline LVH  Recent Labs: 08/21/2022: ALT 17; BUN 21; Creatinine 1.7; Potassium 3.9; Sodium 140 12/31/2022: Hemoglobin 11.6; Platelet Count 180   Recent Lipid Panel    Component Value Date/Time   CHOL 201 (H) 06/06/2022 0824   TRIG 71 06/06/2022 0824   HDL 66 06/06/2022 0824   CHOLHDL 3.0 06/06/2022 0824   CHOLHDL 3.6 06/06/2020 1113   VLDL 17.2 08/13/2018 0916   LDLCALC 122 (H) 06/06/2022 0824   LDLCALC 134 (H) 06/06/2020 1113    Physical Exam:    VS:  BP 138/60   Pulse (!) 56   Ht 5\' 6"  (1.676 m)   Wt 140 lb 3.2 oz (63.6 kg)   BMI 22.63 kg/m     Wt Readings from Last 3 Encounters:  02/12/23 140 lb 3.2 oz (63.6 kg)  12/31/22 142 lb 8 oz (64.6 kg)  12/17/22 140 lb (63.5 kg)    GEN: Well nourished, well developed in no acute distress HEENT: Normal, moist mucous membranes NECK: No JVD CARDIAC: regular rhythm, normal S1 and S2, no rubs or gallops. 1/6 systolic murmur. VASCULAR: Radial pulses 2+ bilaterally. No carotid bruits. DP pulses 1+ bilaterally RESPIRATORY:  Clear to auscultation without rales, wheezing or rhonchi  ABDOMEN: Soft, non-tender, non-distended MUSCULOSKELETAL:  Ambulates independently; SKIN: Warm and dry, no edema NEUROLOGIC:  Alert and oriented x 3. No focal neuro deficits noted. PSYCHIATRIC:  Normal affect    ASSESSMENT:    1. Nonocclusive coronary atherosclerosis of native coronary artery   2. Mixed hyperlipidemia   3. Essential hypertension   4. Type 2 diabetes mellitus with stage 3b chronic kidney disease, with long-term current use of insulin (HCC)   5. Muscle cramp     PLAN:    Muscle cramps -has palpable pulse, but will rule out claudication with ABI given risk factors  Hypertension -continue amlodipine, carvedilol, losartan  Coronary calcification, consistent with nonobstructive CAD Mixed hyperlipidemia -continue  atorvastatin 80 mg three times weekly. Did not tolerate daily dosing due to myalgia. -continue aspirin -last LDL 122. Sent to lipid clinic and started repatha application process. LDL goal <70. She notes that she has not heard anything back/never received repatha. They would like to see  what the out of pocket cost is to make sure they can get started on the medication. Sent to pharmacy today, they will contact me if the cost is very high -ca score 2953 -ETT normal, suggests no obstructive disease at the time of test  Chronic kidney disease stage 3b -followed by Dr. Signe Colt at [facility] -on losartan, farxiga  Type II diabetes -on farxiga, rybelsus, and insulin  Cardiac risk counseling and prevention recommendations: -recommend heart healthy/Mediterranean diet, with whole grains, fruits, vegetable, fish, lean meats, nuts, and olive oil. Limit salt. -recommend moderate walking, 3-5 times/week for 30-50 minutes each session. Aim for at least 150 minutes.week. Goal should be pace of 3 miles/hours, or walking 1.5 miles in 30 minutes -recommend avoidance of tobacco products. Avoid excess alcohol.  Plan for follow up: 3 months or sooner as needed.  Jodelle Red, MD, PhD, [facility], [place]  [facility]    Medication Adjustments/Labs and Tests Ordered: Current medicines are reviewed at length with the patient today.  Concerns regarding medicines are outlined above.   Orders Placed This Encounter  Procedures   VAS [place] ABI WITH/WO TBI   Meds ordered this encounter  Medications   Evolocumab (REPATHA) 140 MG/ML SOSY    Sig: Inject 140 mg into the skin every 14 (fourteen) days.    Dispense:  2.1 mL    Refill:  26   Patient Instructions  Medication Instructions:  RESTART- Repatha 140 mg weekly  *If you need a refill on your cardiac medications before your next appointment, please call your pharmacy*   Lab Work: None Ordered  If you have labs (blood work) drawn today and  your tests are completely normal, you will receive your results only by: MyChart Message (if you have MyChart) OR A paper copy in the mail If you have any lab test that is abnormal or we need to change your treatment, we will call you to review the results.   Testing/Procedures: None Ordered   Follow-Up: At [facility], you and your health needs are our priority.  As part of our continuing mission to provide you with exceptional heart care, we have created designated Provider Care Teams.  These Care Teams include your primary Cardiologist (physician) and Advanced Practice Providers (APPs -  Physician Assistants and Nurse Practitioners) who all work together to provide you with the care you need, when you need it.  We recommend signing up for the patient portal called "MyChart".  Sign up information is provided on this After Visit Summary.  MyChart is used to connect with patients for Virtual Visits (Telemedicine).  Patients are able to view lab/test results, encounter notes, upcoming appointments, etc.  Non-urgent messages can be sent to your provider as well.   To learn more about what you can do with MyChart, go to [email or web address].    Your next appointment:   3 month(s)  Provider:   Dr Hansel Feinstein Stumpf,acting as a scribe for Jodelle Red, MD.,have documented all relevant documentation on the behalf of Jodelle Red, MD,as directed by  Jodelle Red, MD while in the presence of Jodelle Red, MD.   I, Jodelle Red, MD, have reviewed all documentation for this visit. The documentation on 02/12/23 for the exam, diagnosis, procedures, and orders are all accurate and complete.   Signed, Jodelle Red, MD PhD 02/12/2023     [facility]

## 2023-02-12 ENCOUNTER — Encounter (HOSPITAL_BASED_OUTPATIENT_CLINIC_OR_DEPARTMENT_OTHER): Payer: Self-pay | Admitting: Cardiology

## 2023-02-12 ENCOUNTER — Ambulatory Visit (HOSPITAL_BASED_OUTPATIENT_CLINIC_OR_DEPARTMENT_OTHER): Payer: Medicare Other | Admitting: Cardiology

## 2023-02-12 VITALS — BP 138/60 | HR 56 | Ht 66.0 in | Wt 140.2 lb

## 2023-02-12 DIAGNOSIS — E1122 Type 2 diabetes mellitus with diabetic chronic kidney disease: Secondary | ICD-10-CM

## 2023-02-12 DIAGNOSIS — N1832 Chronic kidney disease, stage 3b: Secondary | ICD-10-CM

## 2023-02-12 DIAGNOSIS — Z794 Long term (current) use of insulin: Secondary | ICD-10-CM | POA: Diagnosis not present

## 2023-02-12 DIAGNOSIS — I10 Essential (primary) hypertension: Secondary | ICD-10-CM | POA: Diagnosis not present

## 2023-02-12 DIAGNOSIS — R252 Cramp and spasm: Secondary | ICD-10-CM

## 2023-02-12 DIAGNOSIS — E782 Mixed hyperlipidemia: Secondary | ICD-10-CM

## 2023-02-12 DIAGNOSIS — G4731 Primary central sleep apnea: Secondary | ICD-10-CM | POA: Diagnosis not present

## 2023-02-12 DIAGNOSIS — I2510 Atherosclerotic heart disease of native coronary artery without angina pectoris: Secondary | ICD-10-CM

## 2023-02-12 MED ORDER — REPATHA 140 MG/ML ~~LOC~~ SOSY
140.0000 mg | PREFILLED_SYRINGE | SUBCUTANEOUS | 26 refills | Status: DC
Start: 2023-02-12 — End: 2023-05-15

## 2023-02-12 NOTE — Patient Instructions (Signed)
Medication Instructions:  RESTART- Repatha 140 mg weekly  *If you need a refill on your cardiac medications before your next appointment, please call your pharmacy*   Lab Work: None Ordered  If you have labs (blood work) drawn today and your tests are completely normal, you will receive your results only by: MyChart Message (if you have MyChart) OR A paper copy in the mail If you have any lab test that is abnormal or we need to change your treatment, we will call you to review the results.   Testing/Procedures: None Ordered   Follow-Up: At [facility], you and your health needs are our priority.  As part of our continuing mission to provide you with exceptional heart care, we have created designated Provider Care Teams.  These Care Teams include your primary Cardiologist (physician) and Advanced Practice Providers (APPs -  Physician Assistants and Nurse Practitioners) who all work together to provide you with the care you need, when you need it.  We recommend signing up for the patient portal called "MyChart".  Sign up information is provided on this After Visit Summary.  MyChart is used to connect with patients for Virtual Visits (Telemedicine).  Patients are able to view lab/test results, encounter notes, upcoming appointments, etc.  Non-urgent messages can be sent to your provider as well.   To learn more about what you can do with MyChart, go to [email or web address].    Your next appointment:   3 month(s)  Provider:   Dr Cristal Deer

## 2023-02-13 DIAGNOSIS — N2581 Secondary hyperparathyroidism of renal origin: Secondary | ICD-10-CM | POA: Diagnosis not present

## 2023-02-13 DIAGNOSIS — E1122 Type 2 diabetes mellitus with diabetic chronic kidney disease: Secondary | ICD-10-CM | POA: Diagnosis not present

## 2023-02-13 DIAGNOSIS — D631 Anemia in chronic kidney disease: Secondary | ICD-10-CM | POA: Diagnosis not present

## 2023-02-13 DIAGNOSIS — N1832 Chronic kidney disease, stage 3b: Secondary | ICD-10-CM | POA: Diagnosis not present

## 2023-02-13 DIAGNOSIS — I129 Hypertensive chronic kidney disease with stage 1 through stage 4 chronic kidney disease, or unspecified chronic kidney disease: Secondary | ICD-10-CM | POA: Diagnosis not present

## 2023-02-18 ENCOUNTER — Ambulatory Visit (INDEPENDENT_AMBULATORY_CARE_PROVIDER_SITE_OTHER): Payer: Medicare Other | Admitting: Physician Assistant

## 2023-02-18 VITALS — BP 136/70 | HR 55 | Temp 96.9°F | Ht 66.0 in | Wt 143.0 lb

## 2023-02-18 DIAGNOSIS — E782 Mixed hyperlipidemia: Secondary | ICD-10-CM | POA: Diagnosis not present

## 2023-02-18 DIAGNOSIS — N1832 Chronic kidney disease, stage 3b: Secondary | ICD-10-CM | POA: Diagnosis not present

## 2023-02-18 DIAGNOSIS — Z7984 Long term (current) use of oral hypoglycemic drugs: Secondary | ICD-10-CM

## 2023-02-18 DIAGNOSIS — E119 Type 2 diabetes mellitus without complications: Secondary | ICD-10-CM

## 2023-02-18 LAB — POCT GLYCOSYLATED HEMOGLOBIN (HGB A1C): Hemoglobin A1C: 6.8 % — AB (ref 4.0–5.6)

## 2023-02-18 NOTE — Progress Notes (Signed)
Catherine Mcconnell is a 77 y.o. female here for a follow up of a pre-existing problem.  History of Present Illness:   Chief Complaint  Patient presents with   Diabetes    Diabetes    Diabetes 3 month follow-up. Current DM meds: Farxiga 10 mg and Ryblesus 14 mg. Blood sugars at home are: normally <150. Patient is compliant with medications. Denies: hypoglycemic or hyperglycemic episodes or symptoms. This patient's diabetes is complicated by HYPERTENSION and HLD.  Lab Results  Component Value Date   HGBA1C 6.8 (A) 02/18/2023   CHRONIC KIDNEY DISEASE 3b Saw [facility] Dr Signe Colt earlier this month Got a great report She is doing well and has had significant decrease in her proteinuria  HLD Continues on Lipitor 80 mg daily Started Repatha and is overall doing well  Wt Readings from Last 4 Encounters:  02/18/23 143 lb (64.9 kg)  02/12/23 140 lb 3.2 oz (63.6 kg)  12/31/22 142 lb 8 oz (64.6 kg)  12/17/22 140 lb (63.5 kg)      Past Medical History:  Diagnosis Date   Anemia    on meds   Arthritis    hips/wrists/back   Cataract    sx-bilateral   Chronic kidney disease    stage 3   Diabetes mellitus without complication (HCC)    on meds   Hypertension    on meds   Iron deficiency    on oral iron supplements, has not required transfusion   Mixed hyperlipidemia    on meds   OSA on CPAP    compliant   Sleep apnea    uses CPAP   Thyroid nodule    removed-not on meds at this time (12/01/2020)     Social History   Tobacco Use   Smoking status: Former    Packs/day: 0.25    Years: 1.00    Additional pack years: 0.00    Total pack years: 0.25    Types: Cigarettes    Quit date: 1960    Years since quitting: 64.3   Smokeless tobacco: Never   Tobacco comments:    only socially in high school  Vaping Use   Vaping Use: Never used  Substance Use Topics   Alcohol use: Not Currently   Drug use: Never    Past Surgical History:  Procedure Laterality Date    ABDOMINAL HYSTERECTOMY  1996   MASS EXCISION Right 2017   right arm mass removal   THYROID SURGERY     left side removal benign nodule   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   Procedure: RIGHT TOTAL HIP ARTHROPLASTY ANTERIOR APPROACH;  Surgeon: Tarry Kos, MD;  Location: [facility] OR;  Service: Orthopedics;  Laterality: Right;  3-C    Family History  Problem Relation Age of Onset   Stroke Mother    Hypertension Mother    Heart disease Mother    Heart attack Father    Early death Father    Hypertension Father    Diabetes Sister    Early death Sister    Hypertension Sister    Stroke Sister    Diabetes Sister    Early death Sister    Hyperlipidemia Sister    Kidney disease Sister    Diabetes Sister    Hyperlipidemia Sister    Hypertension Sister    Diabetes Sister    Early death Brother    Arthritis Brother    Heart attack Brother    Heart disease Brother  Arthritis Brother    Memory loss Paternal Uncle    Depression Daughter    Hypertension Daughter    Hyperlipidemia Daughter    Prostate cancer Son    Cancer Son    Early death Son    Cancer Niece        gyn cancer   Colon polyps Neg Hx    Colon cancer Neg Hx    Esophageal cancer Neg Hx    Rectal cancer Neg Hx    Stomach cancer Neg Hx    Sleep apnea Neg Hx     Allergies  Allergen Reactions   Lisinopril Rash   Metformin And Related Swelling    Current Medications:   Current Outpatient Medications:    amLODipine (NORVASC) 10 MG tablet, TAKE 1 TABLET BY MOUTH DAILY, Disp: 100 tablet, Rfl: 2   aspirin EC 81 MG tablet, Take 1 tablet (81 mg total) by mouth daily. Swallow whole., Disp: 90 tablet, Rfl: 3   atorvastatin (LIPITOR) 80 MG tablet, Take 1 tablet by mouth every Monday, Wednesday, and Friday., Disp: 90 tablet, Rfl: 3   Blood Glucose Monitoring Suppl (ONE TOUCH ULTRA MINI) w/Device KIT, Use to check blood sugars twice a day., Disp: 1 kit, Rfl: 0   carvedilol (COREG) 6.25 MG tablet, TAKE 1 TABLET BY MOUTH  TWICE  DAILY WITH MEALS, Disp: 200 tablet, Rfl: 2   donepezil (ARICEPT ODT) 10 MG disintegrating tablet, DISSOLVE 1 TABLET ON THE TONGUE  AT BEDTIME, Disp: 100 tablet, Rfl: 2   Evolocumab (REPATHA) 140 MG/ML SOSY, Inject 140 mg into the skin every 14 (fourteen) days., Disp: 2.1 mL, Rfl: 26   FARXIGA 10 MG TABS tablet, Take 10 mg by mouth daily., Disp: , Rfl:    ferrous sulfate 325 (65 FE) MG EC tablet, Take 325 mg by mouth daily with breakfast., Disp: , Rfl:    gabapentin (NEURONTIN) 100 MG capsule, Take 200 mg by mouth at bedtime., Disp: , Rfl:    Lancets (ONETOUCH ULTRASOFT) lancets, CHECK TWICE DAILY BEFORE  BREAKFAST AND DINNER, Disp: 200 each, Rfl: 2   losartan (COZAAR) 50 MG tablet, TAKE 1 TABLET BY MOUTH DAILY, Disp: 100 tablet, Rfl: 2   LUMIGAN 0.01 % SOLN, Place 1 drop into both eyes every evening., Disp: , Rfl:    mirtazapine (REMERON) 7.5 MG tablet, TAKE 1 TABLET(7.5 MG) BY MOUTH AT BEDTIME, Disp: 90 tablet, Rfl: 0   ONETOUCH ULTRA test strip, USE TO TEST BLOOD SUGARS  TWICE DAILY, Disp: 200 strip, Rfl: 4   Semaglutide (RYBELSUS) 14 MG TABS, Take 1 tablet by mouth in the morning., Disp: , Rfl:    nitroGLYCERIN (NITROSTAT) 0.4 MG SL tablet, Place 1 tablet (0.4 mg total) under the tongue every 5 (five) minutes as needed for chest pain., Disp: 25 tablet, Rfl: 4   Review of Systems:   ROS Negative unless otherwise specified per HPI.   Vitals:   Vitals:   02/18/23 0905  BP: 136/70  Pulse: (!) 55  Temp: (!) 96.9 F (36.1 C)  TempSrc: Temporal  SpO2: 99%  Weight: 143 lb (64.9 kg)  Height: 5\' 6"  (1.676 m)     Body mass index is 23.08 kg/m.  Physical Exam:   Physical Exam Vitals and nursing note reviewed.  Constitutional:      General: She is not in acute distress.    Appearance: She is well-developed. She is not ill-appearing or toxic-appearing.  Cardiovascular:     Rate and Rhythm: Normal rate and regular  rhythm.     Pulses: Normal pulses.     Heart sounds: Normal  heart sounds, S1 normal and S2 normal.  Pulmonary:     Effort: Pulmonary effort is normal.     Breath sounds: Normal breath sounds.  Skin:    General: Skin is warm and dry.  Neurological:     Mental Status: She is alert.     GCS: GCS eye subscore is 4. GCS verbal subscore is 5. GCS motor subscore is 6.  Psychiatric:        Speech: Speech normal.        Behavior: Behavior normal. Behavior is cooperative.    Results for orders placed or performed in visit on 02/18/23  POCT glycosylated hemoglobin (Hb A1C)  Result Value Ref Range   Hemoglobin A1C 6.8 (A) 4.0 - 5.6 %    Assessment and Plan:   Diabetes mellitus without complication (HCC) A1c is improving Continue Farxiga 10 mg daily and Rybelsus 14 mg daily - we did discuss possible reduction of Rybelsus to 7 mg - daughter states that they have some leftover from last decrease and will try this, monitor CBGs and report back if they want to continue this Follow-up in 6 months, sooner if concerns  Stage 3b chronic kidney disease (HCC) Stable Management per renal  Mixed hyperlipidemia Stable Management per cardiology   Jarold Motto, PA-C

## 2023-02-18 NOTE — Patient Instructions (Signed)
It was great to see you!  Plan to decrease Rybelsus to 7 mg daily and report back through  Follow-up in 6 months  Take care,  Jarold Motto PA-C

## 2023-02-21 ENCOUNTER — Encounter: Payer: Self-pay | Admitting: Nurse Practitioner

## 2023-02-21 ENCOUNTER — Other Ambulatory Visit (HOSPITAL_COMMUNITY): Payer: Self-pay

## 2023-02-22 ENCOUNTER — Other Ambulatory Visit: Payer: Self-pay | Admitting: Physician Assistant

## 2023-02-27 ENCOUNTER — Telehealth: Payer: Self-pay | Admitting: Neurology

## 2023-02-27 NOTE — Telephone Encounter (Signed)
Sent by MyChart to patient :   Dear Ms. Denzil Magnuson,  I understand that it has been difficult to control air leaks around the BiPAP mask, you may have to use a chin strap for support at night.  Should you wear dentures, you should wear those under CPAP / BiPAP too.  I have no further suggestion beyond that.      Melvyn Novas, MD Certified in Neurology by ABPN Certified in Sleep Medicine by [facility] [address]

## 2023-02-27 NOTE — Telephone Encounter (Signed)
Received a message from [facility] that the patient is having residual AHI under BiPAP. Will send to Dr Vickey Huger to review and see what she recommends. The report does reflect their are mask leaks and the [facility] team has attempted to work  "I wanted to make you aware that this patient is presenting with an elevated AHI and/or CAI. The compliance and therapy report is attached for your review and to forward to the patient's provider as appropriate.    Current AHI is 13.1 on BiPAP ST 17/12, RR 12.   In addition, the patient is experiencing an inconsistent mask leak and/or low usage which has been addressed by the PAP adherence team. Please refer to [facility] notes for further details."

## 2023-03-03 ENCOUNTER — Ambulatory Visit: Payer: Medicare Other | Admitting: Adult Health

## 2023-03-05 ENCOUNTER — Inpatient Hospital Stay: Payer: Medicare Other

## 2023-03-05 ENCOUNTER — Other Ambulatory Visit: Payer: Self-pay

## 2023-03-05 ENCOUNTER — Inpatient Hospital Stay: Payer: Medicare Other | Attending: Nurse Practitioner

## 2023-03-05 DIAGNOSIS — E538 Deficiency of other specified B group vitamins: Secondary | ICD-10-CM | POA: Insufficient documentation

## 2023-03-05 DIAGNOSIS — D709 Neutropenia, unspecified: Secondary | ICD-10-CM

## 2023-03-05 LAB — CBC WITH DIFFERENTIAL (CANCER CENTER ONLY)
Abs Immature Granulocytes: 0 10*3/uL (ref 0.00–0.07)
Basophils Absolute: 0 10*3/uL (ref 0.0–0.1)
Basophils Relative: 1 %
Eosinophils Absolute: 0.2 10*3/uL (ref 0.0–0.5)
Eosinophils Relative: 8 %
HCT: 34.4 % — ABNORMAL LOW (ref 36.0–46.0)
Hemoglobin: 11.5 g/dL — ABNORMAL LOW (ref 12.0–15.0)
Immature Granulocytes: 0 %
Lymphocytes Relative: 30 %
Lymphs Abs: 0.8 10*3/uL (ref 0.7–4.0)
MCH: 30.4 pg (ref 26.0–34.0)
MCHC: 33.4 g/dL (ref 30.0–36.0)
MCV: 91 fL (ref 80.0–100.0)
Monocytes Absolute: 0.2 10*3/uL (ref 0.1–1.0)
Monocytes Relative: 9 %
Neutro Abs: 1.4 10*3/uL — ABNORMAL LOW (ref 1.7–7.7)
Neutrophils Relative %: 52 %
Platelet Count: 167 10*3/uL (ref 150–400)
RBC: 3.78 MIL/uL — ABNORMAL LOW (ref 3.87–5.11)
RDW: 12.2 % (ref 11.5–15.5)
WBC Count: 2.6 10*3/uL — ABNORMAL LOW (ref 4.0–10.5)
nRBC: 0 % (ref 0.0–0.2)

## 2023-03-05 LAB — VITAMIN B12: Vitamin B-12: 611 pg/mL (ref 180–914)

## 2023-03-05 MED ORDER — CYANOCOBALAMIN 1000 MCG/ML IJ SOLN
1000.0000 ug | Freq: Once | INTRAMUSCULAR | Status: AC
  Administered 2023-03-05: 1000 ug via INTRAMUSCULAR
  Filled 2023-03-05: qty 1

## 2023-03-13 NOTE — Progress Notes (Signed)
PATIENT: Catherine Mcconnell DOB: 10/17/1945  REASON FOR VISIT: follow up HISTORY FROM: patient Primary neurologist: Dr. Vickey Huger  Chief Complaint  Patient presents with   Follow-up    Pt in 8 with daughter  Pt here for CPAP f/u Pt states no questions or concerns for today's visit      HISTORY OF PRESENT ILLNESS: Today 03/17/23:  Catherine Mcconnell is a 77 y.o. female with a history of OSA on Bipap and Memory disturbance. Returns today for follow-up.  She is here today with her daughter.  She reports that they changed out the mask earlier in May.  Patient still has a leak.  She does not wear her dentures when she is sleeping.  Her download is below       02/27/22: Catherine Mcconnell is a 77 year old female with a history of obstructive sleep apnea on CPAP and memory disturbance.  She returns today for follow-up.  OSA On CPAP: DL is below. Wants to try full facemask. Otherwise no compliants.   Memory: Better, currently on Aricept. Reports that she is sleeping better and memory has been better. Lives with daughter- going to [place] to spend the summer with family. Able to complete ADLS independently. Manages own medications, appts and finances.     10/24/21: Catherine Mcconnell is a 77 year old female with a history of obstructive sleep apnea on CPAP and memory disturbance.  She returns today for follow-up.  She is with her daughter.  Patient states that she does not sleep all night long.  States that she typically wakes up and stays up for several hours.  If she goes back to sleep during the day she does not use a CPAP.  Patient had hip replacement in October and daughter states that her schedule has been altered since then.  Patient feels that her memory has remained relatively stable.  She lives with her daughter.  Daughter helps her with her medications.  She reports that she manages her own finances.  She continues on Aricept 10 mg at bedtime.      04/12/21: Catherine Mcconnell is a 77 year old female with a history of  obstructive sleep apnea on CPAP and memory disturbance.  She returns today for follow-up.  She reports that the CPAP works well.  She denies any new issues.  She feels that her memory has remained stable.  She is able to complete all ADLs independently.  She lives with her daughter.  She manages her own medications and appointments.  She is able to operate a motor vehicle.  She currently takes Aricept 5 mg at bedtime.    01/02/21: Catherine Mcconnell  is a 77 year old female with a history of obstructive sleep apnea and memory disturbance.  She returns today for follow-up.  She reports that her memory has been relatively stable.  She does not feel that it is gotten any worse.  She lives with her daughter.  Her daughter was unable to attend this visit.  She states that on occasion she feels brain fog but this is inconsistent versus consistent.  She is able to complete all ADLs independently.  She does operate a Librarian, academic.  Denies any trouble driving but she reports that she does use a GPS consistently to be safe.  She denies any troubles managing her finances.  Reports that she continues to cook meals without difficulty.  She is currently on no medication for her memory.  She continues to use the CPAP consistently.  HISTORY (Copied from Dr.Dohmeier's  note) New problem for this established sleep patient, last seen by NP- Televisit.  Keimora Madon is a 77 year- old african -[place] . female is seen on 03-16-2020 with her daughter. According to her daughter Catherine Mcconnell has forgotten to pay her phone bill which has never happened before, she has gotten lost driving in town on familiar roads.  There were no aggravating factors such as bad weather or nighttime driving.  These events have left Catherine Mcconnell a little bit shaken. Based on these concerns her physician assistant Mrs. Bufford Buttner has sent the patient for follow-up.     The memory loss is accompanied by a rather poorly controlled diabetes still the HbA1c was 8.4  in April of this year, she has a past medical history of chronic kidney disease which is not graded, arthritis, heart iron deficiency, hypertension, mixed hyperlipidemia and she has a history of a thyroid nodule which was surgically removed. She has a past surgical history of has to hysterectomy in 1996 thyroid surgery a biopsy revealed a benign nodule.  And she had a skin mass removed from her right upper arm in 2017 which  was not cancerous.    She is followed here in our sleep clinic for OSA obstructive sleep apnea on CPAP.  Just to stay for another visit she has been 100% compliant by days, 97% compliant by time average use at time of 6 hours 51 minutes, CPAP is set to 11 cm water pressure with 3 cm EPR her residual apnea-hypopnea index is 2.4/h very good she does have some central apneas arising but overall the main problem may be an air leak but not a lack of control of apnea.  95th percentile air leak was 55 L/min which means that the mask gets dislodged sometimes.   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      REVIEW OF SYSTEMS: Out of a complete 14 system review of symptoms, the patient complains only of the following symptoms, and all other reviewed systems are negative.  ESS 16   ALLERGIES: Allergies  Allergen Reactions   Lisinopril Rash   Metformin And Related Swelling    HOME MEDICATIONS: Outpatient Medications Prior to Visit  Medication Sig Dispense Refill   amLODipine (NORVASC) 10 MG tablet TAKE 1 TABLET BY MOUTH DAILY 100 tablet 2   aspirin EC 81 MG tablet Take 1 tablet (81 mg total) by mouth daily. Swallow whole. 90 tablet 3   atorvastatin (LIPITOR) 80 MG tablet Take 1 tablet by mouth every Monday, Wednesday, and Friday. 90 tablet 3   Blood Glucose Monitoring Suppl (ONE TOUCH ULTRA MINI) w/Device KIT  Use to check blood sugars twice a day. 1 kit 0   carvedilol (COREG) 6.25 MG tablet TAKE 1 TABLET BY MOUTH TWICE  DAILY WITH MEALS 200 tablet 2   donepezil (ARICEPT ODT) 10 MG disintegrating tablet DISSOLVE 1 TABLET ON THE TONGUE  AT BEDTIME 100 tablet 2   Evolocumab (REPATHA) 140 MG/ML SOSY Inject 140 mg into the skin every 14 (fourteen) days. 2.1 mL 26   FARXIGA 10 MG TABS tablet Take 10 mg by mouth daily.     ferrous sulfate 325 (65 FE) MG EC tablet Take 325 mg by mouth daily with breakfast.     gabapentin (NEURONTIN) 100 MG capsule Take 200 mg by  mouth at bedtime.     Lancets (ONETOUCH ULTRASOFT) lancets CHECK TWICE DAILY BEFORE  BREAKFAST AND DINNER 200 each 2   losartan (COZAAR) 50 MG tablet TAKE 1 TABLET BY MOUTH DAILY 100 tablet 2   LUMIGAN 0.01 % SOLN Place 1 drop into both eyes every evening.     mirtazapine (REMERON) 7.5 MG tablet TAKE 1 TABLET(7.5 MG) BY MOUTH AT BEDTIME 90 tablet 0   ONETOUCH ULTRA test strip CHECK BLOOD SUGAR TWICE DAILY 200 strip 2   Semaglutide 7 MG TABS Take 1 tablet by mouth in the morning.     nitroGLYCERIN (NITROSTAT) 0.4 MG SL tablet Place 1 tablet (0.4 mg total) under the tongue every 5 (five) minutes as needed for chest pain. 25 tablet 4   No facility-administered medications prior to visit.    PAST MEDICAL HISTORY: Past Medical History:  Diagnosis Date   Anemia    on meds   Arthritis    hips/wrists/back   Cataract    sx-bilateral   Chronic kidney disease    stage 3   Diabetes mellitus without complication (HCC)    on meds   Hypertension    on meds   Iron deficiency    on oral iron supplements, has not required transfusion   Mixed hyperlipidemia    on meds   OSA on CPAP    compliant   Sleep apnea    uses CPAP   Thyroid nodule    removed-not on meds at this time (12/01/2020)    PAST SURGICAL HISTORY: Past Surgical History:  Procedure Laterality Date   ABDOMINAL HYSTERECTOMY  1996   MASS EXCISION Right 2017   right arm mass  removal   THYROID SURGERY     left side removal benign nodule   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   Procedure: RIGHT TOTAL HIP ARTHROPLASTY ANTERIOR APPROACH;  Surgeon: Tarry Kos, MD;  Location: [facility] OR;  Service: Orthopedics;  Laterality: Right;  3-C    FAMILY HISTORY: Family History  Problem Relation Age of Onset   Stroke Mother    Hypertension Mother    Heart disease Mother    Heart attack Father    Early death Father    Hypertension Father    Diabetes Sister    Early death Sister    Hypertension Sister    Stroke Sister    Diabetes Sister    Early death Sister    Hyperlipidemia Sister    Kidney disease Sister    Diabetes Sister    Hyperlipidemia Sister    Hypertension Sister    Diabetes Sister    Early death Brother    Arthritis Brother    Heart attack Brother    Heart disease Brother    Arthritis Brother    Memory loss Paternal Uncle    Depression Daughter    Hypertension Daughter    Hyperlipidemia Daughter    Prostate cancer Son    Cancer Son    Early death Son    Cancer Niece        gyn cancer   Colon polyps Neg Hx    Colon cancer Neg Hx    Esophageal cancer Neg Hx    Rectal cancer Neg Hx    Stomach cancer Neg Hx    Sleep apnea Neg Hx     SOCIAL HISTORY: Social History   Socioeconomic History   Marital status: Divorced    Spouse name: Not on file   Number of children: 6  Years of education: some college   Highest education level: Some college, no degree  Occupational History   Occupation: Retired   Tobacco Use   Smoking status: Former    Packs/day: 0.25    Years: 1.00    Additional pack years: 0.00    Total pack years: 0.25    Types: Cigarettes    Quit date: 1960    Years since quitting: 64.4   Smokeless tobacco: Never   Tobacco comments:    only socially in high school  Vaping Use   Vaping Use: Never used  Substance and Sexual Activity   Alcohol use: Not Currently   Drug use: Never   Sexual activity: Not on file  Other  Topics Concern   Not on file  Social History Narrative   03/16/20 lives with dgtr Catherine Mcconnell   Mother of 6   --Two children have passed, one from from prostate cancer and one from liver failure   Used to be a Lawyer, Programmer, systems   Recently moved back to [place] [place]   Social Determinants of Health   Financial Resource Strain: Low Risk  (02/18/2023)   Overall Financial Resource Strain (CARDIA)    Difficulty of Paying Living Expenses: Not hard at all  Food Insecurity: No Food Insecurity (02/18/2023)   Hunger Vital Sign    Worried About Running Out of Food in the Last Year: Never true    Ran Out of Food in the Last Year: Never true  Transportation Needs: No Transportation Needs (02/18/2023)   PRAPARE - Administrator, Civil Service (Medical): No    Lack of Transportation (Non-Medical): No  Physical Activity: Inactive (02/18/2023)   Exercise Vital Sign    Days of Exercise per Week: 0 days    Minutes of Exercise per Session: 0 min  Stress: No Stress Concern Present (02/18/2023)   [employer] of Occupational Health - Occupational Stress Questionnaire    Feeling of Stress : Only a little  Social Connections: Unknown (02/18/2023)   Social Connection and Isolation Panel [NHANES]    Frequency of Communication with Friends and Family: Once a week    Frequency of Social Gatherings with Friends and Family: Patient declined    Attends Religious Services: More than 4 times per year    Active Member of [employer] or Organizations: No    Attends Banker Meetings: Never    Marital Status: Divorced  Catering manager Violence: Not At Risk (10/21/2022)   Humiliation, Afraid, Rape, and Kick questionnaire    Fear of Current or Ex-Partner: No    Emotionally Abused: No    Physically Abused: No    Sexually Abused: No      PHYSICAL EXAM  Vitals:   03/17/23 0900  BP: 137/70  Pulse: (!) 50  Weight: 135 lb (61.2 kg)  Height: 5\' 6"  (1.676 m)     Body mass index is 21.79 kg/m.      02/27/2022    12:02 PM 10/24/2021   11:17 AM 04/12/2021    2:25 PM  MMSE - Mini Mental State Exam  Orientation to time 4 3 5   Orientation to Place 3 5 5   Registration 3 3 3   Attention/ Calculation 5 1 2   Recall 2 2 3   Language- name 2 objects 2 2 2   Language- repeat 1 1 1   Language- follow 3 step command 3 3 3   Language- read & follow direction 1 1 1   Write a sentence 1 1 1   Copy design  0 1 1  Total score 25 23 27         01/02/2021    1:27 PM 07/17/2020    9:19 AM 03/16/2020    9:40 AM  Montreal Cognitive Assessment   Visuospatial/ Executive (0/5) 5 5 4   Naming (0/3) 3 3 3   Attention: Read list of digits (0/2) 0 2 1  Attention: Read list of letters (0/1) 1 1 1   Attention: Serial 7 subtraction starting at 100 (0/3) 1 2 3   Language: Repeat phrase (0/2) 2 2 2   Language : Fluency (0/1) 1 0 1  Abstraction (0/2) 2 2 2   Delayed Recall (0/5) 1 2 1   Orientation (0/6) 6 6 6   Total 22 25 24   Adjusted Score (based on education)  25     Generalized: Well developed, in no acute distress  Chest: Lungs clear to auscultation bilaterally  Neurological examination  Mentation: Alert oriented to time, place, history taking. Follows all commands speech and language fluent Cranial nerve II-XII: Facial symmetry noted DIAGNOSTIC DATA (LABS, IMAGING, TESTING) - I reviewed patient records, labs, notes, testing and imaging myself where available.  Lab Results  Component Value Date   WBC 2.6 (L) 03/05/2023   HGB 11.5 (L) 03/05/2023   HCT 34.4 (L) 03/05/2023   MCV 91.0 03/05/2023   PLT 167 03/05/2023      Component Value Date/Time   NA 140 08/21/2022 0000   K 3.9 08/21/2022 0000   CL 103 08/21/2022 0000   CO2 23 (A) 08/21/2022 0000   GLUCOSE 88 08/07/2021 0504   BUN 21 08/21/2022 0000   CREATININE 1.7 (A) 08/21/2022 0000   CREATININE 1.33 (H) 08/07/2021 0504   CREATININE 1.44 (H) 06/14/2020 1353   CALCIUM 9.5 08/23/2022 0000   PROT 6.3 (L) 08/01/2021 0902   ALBUMIN 4.2 08/23/2022 0000   AST 25  02/20/2022 0000   ALT 17 08/21/2022 0000   ALKPHOS 56 02/20/2022 0000   BILITOT 1.0 08/01/2021 0902   GFRNONAA 42 (L) 08/07/2021 0504   GFRAA 38 10/16/2021 0000   Lab Results  Component Value Date   CHOL 201 (H) 06/06/2022   HDL 66 06/06/2022   LDLCALC 122 (H) 06/06/2022   TRIG 71 06/06/2022   CHOLHDL 3.0 06/06/2022   Lab Results  Component Value Date   HGBA1C 6.8 (A) 02/18/2023   Lab Results  Component Value Date   VITAMINB12 611 03/05/2023   Lab Results  Component Value Date   TSH 3.48 06/14/2020      ASSESSMENT AND PLAN 77 y.o. year old female  has a past medical history of Anemia, Arthritis, Cataract, Chronic kidney disease, Diabetes mellitus without complication (HCC), Hypertension, Iron deficiency, Mixed hyperlipidemia, OSA on CPAP, Sleep apnea, and Thyroid nodule. here with:   1.  Sleep apnea on Bipap  Good compliance Residual AHI is elevated will increase pressure 18/13 with respiratory rate of 12 also ordered a mask refitting to be fitted without her dentures in place Encouraged her to continue using CPAP nightly and greater than 4 hours each night  Follow-up in 6-7 months or sooner if needed    Butch Penny, MSN, NP-C 03/17/2023, 9:03 AM [facility] [address], [address] [place], [place] [address] [phone]

## 2023-03-15 DIAGNOSIS — G4731 Primary central sleep apnea: Secondary | ICD-10-CM | POA: Diagnosis not present

## 2023-03-17 ENCOUNTER — Ambulatory Visit: Payer: Medicare Other | Admitting: Adult Health

## 2023-03-17 ENCOUNTER — Encounter: Payer: Self-pay | Admitting: Adult Health

## 2023-03-17 VITALS — BP 137/70 | HR 50 | Ht 66.0 in | Wt 135.0 lb

## 2023-03-17 DIAGNOSIS — G4733 Obstructive sleep apnea (adult) (pediatric): Secondary | ICD-10-CM

## 2023-03-17 NOTE — Progress Notes (Addendum)
Order for pressure change and mask refit sent to [facility]. [facility] confirmed receipt of order.

## 2023-03-19 ENCOUNTER — Other Ambulatory Visit (HOSPITAL_BASED_OUTPATIENT_CLINIC_OR_DEPARTMENT_OTHER): Payer: Self-pay | Admitting: Cardiology

## 2023-03-19 DIAGNOSIS — I739 Peripheral vascular disease, unspecified: Secondary | ICD-10-CM

## 2023-03-20 ENCOUNTER — Ambulatory Visit (INDEPENDENT_AMBULATORY_CARE_PROVIDER_SITE_OTHER): Payer: Medicare Other

## 2023-03-20 DIAGNOSIS — R252 Cramp and spasm: Secondary | ICD-10-CM

## 2023-03-20 DIAGNOSIS — I739 Peripheral vascular disease, unspecified: Secondary | ICD-10-CM

## 2023-03-20 LAB — VAS US ABI WITH/WO TBI
Left ABI: 1.06
Right ABI: 1.06

## 2023-03-31 ENCOUNTER — Other Ambulatory Visit: Payer: Self-pay | Admitting: Physician Assistant

## 2023-04-02 ENCOUNTER — Other Ambulatory Visit: Payer: Self-pay | Admitting: *Deleted

## 2023-04-02 ENCOUNTER — Inpatient Hospital Stay: Payer: Medicare Other | Attending: Nurse Practitioner

## 2023-04-02 ENCOUNTER — Other Ambulatory Visit: Payer: Self-pay

## 2023-04-02 ENCOUNTER — Inpatient Hospital Stay: Payer: Medicare Other

## 2023-04-02 VITALS — BP 131/62 | HR 53 | Temp 98.1°F | Resp 16

## 2023-04-02 DIAGNOSIS — E538 Deficiency of other specified B group vitamins: Secondary | ICD-10-CM | POA: Diagnosis not present

## 2023-04-02 DIAGNOSIS — D709 Neutropenia, unspecified: Secondary | ICD-10-CM

## 2023-04-02 LAB — CBC WITH DIFFERENTIAL (CANCER CENTER ONLY)
Abs Immature Granulocytes: 0 10*3/uL (ref 0.00–0.07)
Basophils Absolute: 0 10*3/uL (ref 0.0–0.1)
Basophils Relative: 1 %
Eosinophils Absolute: 0.1 10*3/uL (ref 0.0–0.5)
Eosinophils Relative: 5 %
HCT: 34 % — ABNORMAL LOW (ref 36.0–46.0)
Hemoglobin: 11.5 g/dL — ABNORMAL LOW (ref 12.0–15.0)
Immature Granulocytes: 0 %
Lymphocytes Relative: 28 %
Lymphs Abs: 0.9 10*3/uL (ref 0.7–4.0)
MCH: 30.9 pg (ref 26.0–34.0)
MCHC: 33.8 g/dL (ref 30.0–36.0)
MCV: 91.4 fL (ref 80.0–100.0)
Monocytes Absolute: 0.3 10*3/uL (ref 0.1–1.0)
Monocytes Relative: 10 %
Neutro Abs: 1.8 10*3/uL (ref 1.7–7.7)
Neutrophils Relative %: 56 %
Platelet Count: 162 10*3/uL (ref 150–400)
RBC: 3.72 MIL/uL — ABNORMAL LOW (ref 3.87–5.11)
RDW: 12.2 % (ref 11.5–15.5)
WBC Count: 3.1 10*3/uL — ABNORMAL LOW (ref 4.0–10.5)
nRBC: 0 % (ref 0.0–0.2)

## 2023-04-02 MED ORDER — CYANOCOBALAMIN 1000 MCG/ML IJ SOLN
1000.0000 ug | Freq: Once | INTRAMUSCULAR | Status: AC
  Administered 2023-04-02: 1000 ug via INTRAMUSCULAR
  Filled 2023-04-02: qty 1

## 2023-04-02 NOTE — Patient Instructions (Signed)

## 2023-04-19 ENCOUNTER — Encounter: Payer: Self-pay | Admitting: Nurse Practitioner

## 2023-04-23 ENCOUNTER — Encounter: Payer: Self-pay | Admitting: Nurse Practitioner

## 2023-04-30 ENCOUNTER — Inpatient Hospital Stay: Payer: No Typology Code available for payment source

## 2023-04-30 ENCOUNTER — Encounter: Payer: Self-pay | Admitting: Hematology

## 2023-04-30 ENCOUNTER — Inpatient Hospital Stay: Payer: No Typology Code available for payment source | Attending: Nurse Practitioner | Admitting: Hematology

## 2023-04-30 VITALS — BP 143/71 | HR 50 | Temp 98.6°F | Resp 18 | Ht 66.0 in | Wt 143.4 lb

## 2023-04-30 DIAGNOSIS — E538 Deficiency of other specified B group vitamins: Secondary | ICD-10-CM | POA: Diagnosis not present

## 2023-04-30 DIAGNOSIS — D709 Neutropenia, unspecified: Secondary | ICD-10-CM

## 2023-04-30 LAB — CBC WITH DIFFERENTIAL (CANCER CENTER ONLY)
Abs Immature Granulocytes: 0 10*3/uL (ref 0.00–0.07)
Basophils Absolute: 0 10*3/uL (ref 0.0–0.1)
Basophils Relative: 1 %
Eosinophils Absolute: 0.2 10*3/uL (ref 0.0–0.5)
Eosinophils Relative: 7 %
HCT: 33.7 % — ABNORMAL LOW (ref 36.0–46.0)
Hemoglobin: 11.5 g/dL — ABNORMAL LOW (ref 12.0–15.0)
Immature Granulocytes: 0 %
Lymphocytes Relative: 27 %
Lymphs Abs: 0.8 10*3/uL (ref 0.7–4.0)
MCH: 30.7 pg (ref 26.0–34.0)
MCHC: 34.1 g/dL (ref 30.0–36.0)
MCV: 89.9 fL (ref 80.0–100.0)
Monocytes Absolute: 0.3 10*3/uL (ref 0.1–1.0)
Monocytes Relative: 9 %
Neutro Abs: 1.7 10*3/uL (ref 1.7–7.7)
Neutrophils Relative %: 56 %
Platelet Count: 149 10*3/uL — ABNORMAL LOW (ref 150–400)
RBC: 3.75 MIL/uL — ABNORMAL LOW (ref 3.87–5.11)
RDW: 12.3 % (ref 11.5–15.5)
WBC Count: 2.9 10*3/uL — ABNORMAL LOW (ref 4.0–10.5)
nRBC: 0 % (ref 0.0–0.2)

## 2023-04-30 LAB — VITAMIN B12: Vitamin B-12: 510 pg/mL (ref 180–914)

## 2023-04-30 MED ORDER — CYANOCOBALAMIN 1000 MCG/ML IJ SOLN
1000.0000 ug | Freq: Once | INTRAMUSCULAR | Status: AC
  Administered 2023-04-30: 1000 ug via INTRAMUSCULAR
  Filled 2023-04-30: qty 1

## 2023-04-30 NOTE — Assessment & Plan Note (Signed)
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 No recent med changes. HCV and B12 normal in the past. No true IDA but has been on oral iron for many years -We reviewed common etiologies of leukopenia.  -Her lab workup in March 2024 was unremarkable except slightly low B12 level -Given the mild and intermittent nature, and no other cytopenias, this is unlikely primary bone marrow disorder -I recommend her to continue oral B12 supplement, and monitoring.

## 2023-04-30 NOTE — Progress Notes (Signed)
[facility]   Telephone:([phone]) [phone] Fax:(336) [phone]   Clinic Follow up Note   Patient Care Team: Jarold Motto, [place] as PCP - General (Physician Assistant) Jodelle Red, MD as PCP - Cardiology (Cardiology) Elder Negus, MD as Consulting Physician (Cardiology) Bufford Buttner, MD as Consulting Physician (Nephrology) Arnette Felts, FNP (General Practice) Dohmeier, Porfirio Mylar, MD as Consulting Physician (Neurology) [facility] as Consulting Physician  Date of Service:  04/30/2023  CHIEF COMPLAINT: f/u of Leukopenia   CURRENT THERAPY:  B12 Loaded and Maintenance q28d  ASSESSMENT:  Catherine Mcconnell is a 77 y.o. female with   Neutropenia (HCC), mild B12 deficiency  She has had mild intermittent leukopenia with neutropenia since at least 2012 -No h/o recurrent infections or autoimmune disorder. No recent med changes. HCV and B12 normal in the past. No true IDA but has been on oral iron for many years -We reviewed common etiologies of leukopenia.  -Her lab workup in March 2024 was unremarkable except slightly low B12 level. She started B12 injections in our office  -Given the mild and intermittent nature, and no other cytopenias, this is unlikely primary bone marrow disorder -I recommend her to start oral B12 supplement, and monitoring B12 level  -I will see her as needed     PLAN: -recent B 12 level-normal -Will proceed B12 injection today, and switch to oral B12. -recommend OTC oral B12 daily  -the mild and intermittent nature, and no other cytopenias, this is unlikely primary bone marrow disorder -recommend f/u with PCP and check B12 level every 3-6 months  -f/u as needed   INTERVAL HISTORY:  Catherine Mcconnell is here for a follow up of Leukopenia. She was last seen by NP Lacie on 12/31/2022. She presents to the clinic accompanied by daughter.  She is doing very well, no complaints..   All other systems were reviewed  with the patient and are negative.  MEDICAL HISTORY:  Past Medical History:  Diagnosis Date   Anemia    on meds   Arthritis    hips/wrists/back   Cataract    sx-bilateral   Chronic kidney disease    stage 3   Diabetes mellitus without complication (HCC)    on meds   Hypertension    on meds   Iron deficiency    on oral iron supplements, has not required transfusion   Mixed hyperlipidemia    on meds   OSA on CPAP    compliant   Sleep apnea    uses CPAP   Thyroid nodule    removed-not on meds at this time (12/01/2020)    SURGICAL HISTORY: Past Surgical History:  Procedure Laterality Date   ABDOMINAL HYSTERECTOMY  1996   MASS EXCISION Right 2017   right arm mass removal   THYROID SURGERY     left side removal benign nodule   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   Procedure: RIGHT TOTAL HIP ARTHROPLASTY ANTERIOR APPROACH;  Surgeon: Tarry Kos, MD;  Location: [facility] OR;  Service: Orthopedics;  Laterality: Right;  3-C    I have reviewed the social history and family history with the patient and they are unchanged from previous note.  ALLERGIES:  is allergic to lisinopril and metformin and related.  MEDICATIONS:  Current Outpatient Medications  Medication Sig Dispense Refill   amLODipine (NORVASC) 10 MG tablet TAKE 1 TABLET BY MOUTH DAILY 100 tablet 2   aspirin EC 81 MG tablet Take 1 tablet (81 mg total)  by mouth daily. Swallow whole. 90 tablet 3   atorvastatin (LIPITOR) 80 MG tablet Take 1 tablet by mouth every Monday, Wednesday, and Friday. 90 tablet 3   Blood Glucose Monitoring Suppl (ONE TOUCH ULTRA MINI) w/Device KIT Use to check blood sugars twice a day. 1 kit 0   carvedilol (COREG) 6.25 MG tablet TAKE 1 TABLET BY MOUTH TWICE  DAILY WITH MEALS 200 tablet 2   donepezil (ARICEPT ODT) 10 MG disintegrating tablet DISSOLVE 1 TABLET ON THE TONGUE  AT BEDTIME 100 tablet 2   Evolocumab (REPATHA) 140 MG/ML SOSY Inject 140 mg into the skin every 14 (fourteen) days. 2.1 mL  26   FARXIGA 10 MG TABS tablet Take 10 mg by mouth daily.     ferrous sulfate 325 (65 FE) MG EC tablet Take 325 mg by mouth daily with breakfast.     gabapentin (NEURONTIN) 100 MG capsule Take 200 mg by mouth at bedtime.     Lancets (ONETOUCH ULTRASOFT) lancets CHECK TWICE DAILY BEFORE  BREAKFAST AND DINNER 200 each 2   losartan (COZAAR) 50 MG tablet TAKE 1 TABLET BY MOUTH DAILY 100 tablet 2   LUMIGAN 0.01 % SOLN Place 1 drop into both eyes every evening.     mirtazapine (REMERON) 7.5 MG tablet TAKE 1 TABLET(7.5 MG) BY MOUTH AT BEDTIME 90 tablet 0   nitroGLYCERIN (NITROSTAT) 0.4 MG SL tablet Place 1 tablet (0.4 mg total) under the tongue every 5 (five) minutes as needed for chest pain. 25 tablet 4   ONETOUCH ULTRA test strip CHECK BLOOD SUGAR TWICE DAILY 200 strip 2   Semaglutide 7 MG TABS Take 1 tablet by mouth in the morning.     No current facility-administered medications for this visit.    PHYSICAL EXAMINATION: ECOG PERFORMANCE STATUS: 0 - Asymptomatic  Vitals:   04/30/23 1344  BP: (!) 143/71  Pulse: (!) 50  Resp: 18  Temp: 98.6 F (37 C)  SpO2: 100%   Wt Readings from Last 3 Encounters:  04/30/23 143 lb 6.4 oz (65 kg)  03/17/23 135 lb (61.2 kg)  02/18/23 143 lb (64.9 kg)     GENERAL:alert, no distress and comfortable SKIN: skin color normal, no rashes or significant lesions EYES: normal, Conjunctiva are pink and non-injected, sclera clear  NEURO: alert & oriented x 3 with fluent speech LABORATORY DATA:  I have reviewed the data as listed    Latest Ref Rng & Units 04/30/2023    1:24 PM 04/02/2023    1:15 PM 03/05/2023    1:06 PM  CBC  WBC 4.0 - 10.5 K/uL 2.9  3.1  2.6   Hemoglobin 12.0 - 15.0 g/dL 16.1  09.6  04.5   Hematocrit 36.0 - 46.0 % 33.7  34.0  34.4   Platelets 150 - 400 K/uL 149  162  167         Latest Ref Rng & Units 08/23/2022   12:00 AM 08/21/2022   12:00 AM 02/20/2022   12:00 AM  CMP  BUN 4 - 21  21     33      Creatinine 0.5 - 1.1  1.7     1.8       Sodium 137 - 147  140     138      Potassium 3.5 - 5.1 mEq/L  3.9     3.9      Chloride 99 - 108  103     100      CO2  13 - 22  23     27       Calcium 8.7 - 10.7 9.5      9.7      Alkaline Phos 25 - 125   56      AST 13 - 35   25      ALT 7 - 35 U/L  17     20         This result is from an external source.      RADIOGRAPHIC STUDIES: I have personally reviewed the radiological images as listed and agreed with the findings in the report. No results found.    No orders of the defined types were placed in this encounter.  All questions were answered. The patient knows to call the clinic with any problems, questions or concerns. No barriers to learning was detected. The total time spent in the appointment was 20 minutes.     Malachy Mood, MD 04/30/2023   Carolin Coy, CMA, am acting as scribe for Malachy Mood, MD.   I have reviewed the above documentation for accuracy and completeness, and I agree with the above.

## 2023-05-14 ENCOUNTER — Encounter: Payer: Self-pay | Admitting: Nurse Practitioner

## 2023-05-15 ENCOUNTER — Ambulatory Visit (INDEPENDENT_AMBULATORY_CARE_PROVIDER_SITE_OTHER): Payer: No Typology Code available for payment source | Admitting: Cardiology

## 2023-05-15 ENCOUNTER — Encounter (HOSPITAL_BASED_OUTPATIENT_CLINIC_OR_DEPARTMENT_OTHER): Payer: Self-pay | Admitting: Cardiology

## 2023-05-15 VITALS — BP 124/54 | HR 52 | Ht 66.0 in | Wt 137.6 lb

## 2023-05-15 DIAGNOSIS — I2510 Atherosclerotic heart disease of native coronary artery without angina pectoris: Secondary | ICD-10-CM

## 2023-05-15 DIAGNOSIS — N1832 Chronic kidney disease, stage 3b: Secondary | ICD-10-CM | POA: Diagnosis not present

## 2023-05-15 DIAGNOSIS — Z794 Long term (current) use of insulin: Secondary | ICD-10-CM | POA: Diagnosis not present

## 2023-05-15 DIAGNOSIS — I10 Essential (primary) hypertension: Secondary | ICD-10-CM

## 2023-05-15 DIAGNOSIS — E782 Mixed hyperlipidemia: Secondary | ICD-10-CM

## 2023-05-15 DIAGNOSIS — R931 Abnormal findings on diagnostic imaging of heart and coronary circulation: Secondary | ICD-10-CM | POA: Diagnosis not present

## 2023-05-15 DIAGNOSIS — R252 Cramp and spasm: Secondary | ICD-10-CM

## 2023-05-15 DIAGNOSIS — E1122 Type 2 diabetes mellitus with diabetic chronic kidney disease: Secondary | ICD-10-CM | POA: Diagnosis not present

## 2023-05-15 MED ORDER — CARVEDILOL 6.25 MG PO TABS
ORAL_TABLET | ORAL | 3 refills | Status: DC
Start: 2023-05-15 — End: 2023-10-22

## 2023-05-15 MED ORDER — LOSARTAN POTASSIUM 50 MG PO TABS
50.0000 mg | ORAL_TABLET | Freq: Every day | ORAL | 3 refills | Status: DC
Start: 2023-05-15 — End: 2023-10-22

## 2023-05-15 MED ORDER — ATORVASTATIN CALCIUM 80 MG PO TABS
ORAL_TABLET | ORAL | 3 refills | Status: DC
Start: 2023-05-15 — End: 2023-10-22

## 2023-05-15 MED ORDER — REPATHA 140 MG/ML ~~LOC~~ SOSY
140.0000 mg | PREFILLED_SYRINGE | SUBCUTANEOUS | 26 refills | Status: DC
Start: 2023-05-15 — End: 2023-10-22

## 2023-05-15 MED ORDER — AMLODIPINE BESYLATE 10 MG PO TABS
10.0000 mg | ORAL_TABLET | Freq: Every day | ORAL | 3 refills | Status: DC
Start: 2023-05-15 — End: 2023-10-22

## 2023-05-15 MED ORDER — NITROGLYCERIN 0.4 MG SL SUBL
0.4000 mg | SUBLINGUAL_TABLET | SUBLINGUAL | 4 refills | Status: DC | PRN
Start: 2023-05-15 — End: 2023-10-22

## 2023-05-15 NOTE — Progress Notes (Signed)
Cardiology Office Note:  .   Date:  05/15/2023  ID:  Catherine Mcconnell, DOB 1946-02-14, MRN [id number] PCP: Jarold Motto, PA  [facility] Providers Cardiologist:  Jodelle Red, MD {  History of Present Illness: .   Catherine Mcconnell is a 77 y.o. female with a hx of hypertension, OSA on CPAP, type II diabetes, chronic kidney disease stage 3a, mixed hyperlipidemia, who is seen for follow up. Previously seen as a new consult at the request of Jarold Motto, [place] for the evaluation and management of hypertension.   She was accompanied by her daughter today, who is also my patient. She is a retired Pensions consultant.    Cardiac history: hypertension "for decades." Hypercholesterolemia, but does not tolerate daily statin due to myalgia. Type II diabetes for >20 years. Chronic kidney disease, follows with Dr. Signe Colt. Calcium score very elevated but asymptomatic. Pending PCSK9i.  Today: Here with daughter today. Overall doing well. Walks in the backyard. Playing with great grandbabies. Muscle cramps have eased up but still catch her on occasion.   Has started repatha, discussed today. Due for lipid check. Tolerating well.  ROS: Denies chest pain, shortness of breath at rest or with normal exertion. No PND, orthopnea, LE edema or unexpected weight gain. No syncope or palpitations. ROS otherwise negative except as noted.   Studies Reviewed: Marland Kitchen    EKG:  EKG Interpretation Date/Time:  Thursday May 15 2023 08:56:05 EDT Ventricular Rate:  52 PR Interval:  204 QRS Duration:  76 QT Interval:  452 QTC Calculation: 420 R Axis:   -4  Text Interpretation: Sinus bradycardia Nonspecific T wave abnormality Confirmed by Jodelle Red [phone]) on 05/15/2023 9:15:59 AM    Physical Exam:   VS:  BP (!) 124/54 (BP Location: Right Arm, Patient Position: Sitting, Cuff Size: Normal)   Pulse (!) 52   Ht 5\' 6"  (1.676 m)   Wt 137 lb 9.6 oz (62.4 kg)   BMI 22.21 kg/m    Wt Readings from Last 3  Encounters:  05/15/23 137 lb 9.6 oz (62.4 kg)  04/30/23 143 lb 6.4 oz (65 kg)  03/17/23 135 lb (61.2 kg)    GEN: Well nourished, well developed in no acute distress HEENT: Normal, moist mucous membranes NECK: No JVD CARDIAC: regular rhythm, normal S1 and S2, no rubs or gallops. No murmur. VASCULAR: Radial and DP pulses 2+ bilaterally. No carotid bruits RESPIRATORY:  Clear to auscultation without rales, wheezing or rhonchi  ABDOMEN: Soft, non-tender, non-distended MUSCULOSKELETAL:  Ambulates independently SKIN: Warm and dry, no edema NEUROLOGIC:  Alert and oriented x 3. No focal neuro deficits noted. PSYCHIATRIC:  Normal affect    ASSESSMENT AND PLAN: .   Muscle cramps -has palpable pulse, ABIs normal   Hypertension -continue amlodipine, carvedilol, losartan   Coronary calcification, consistent with nonobstructive CAD Mixed hyperlipidemia -continue atorvastatin 80 mg three times weekly. Did not tolerate daily dosing due to myalgia. -continue aspirin -started repatha 02/2023, due for recheck lipids, ordered today -ca score 2953 -ETT normal, suggests no obstructive disease at the time of test   Chronic kidney disease stage 3b -followed by Dr. Signe Colt at [facility] -on losartan, farxiga   Type II diabetes -on farxiga, rybelsus, and insulin   CV risk counseling and prevention -recommend heart healthy/Mediterranean diet, with whole grains, fruits, vegetable, fish, lean meats, nuts, and olive oil. Limit salt. -recommend moderate walking, 3-5 times/week for 30-50 minutes each session. Aim for at least 150 minutes.week. Goal should be pace of 3  miles/hours, or walking 1.5 miles in 30 minutes -recommend avoidance of tobacco products. Avoid excess alcohol.  Dispo: 6 mos  Signed, Jodelle Red, MD   Jodelle Red, MD, PhD, [facility]  [facility]  [place]  [facility] at [facility] [address],  [address] [place], [place] [address] [phone]

## 2023-05-15 NOTE — Patient Instructions (Signed)
Medication Instructions: insulin Your physician recommends that you continue on your current medications as directed. Please refer to the Current Medication list given to you today.   *If you need a refill on your cardiac medications before your next appointment, please call your pharmacy*  Lab Work: FASTING LIPIDS SOON   If you have labs (blood work) drawn today and your tests are completely normal, you will receive your results only by: MyChart Message (if you have MyChart) OR A paper copy in the mail If you have any lab test that is abnormal or we need to change your treatment, we will call you to review the results.  Testing/Procedures: NONE  Follow-Up: At [facility], you and your health needs are our priority.  As part of our continuing mission to provide you with exceptional heart care, we have created designated Provider Care Teams.  These Care Teams include your primary Cardiologist (physician) and Advanced Practice Providers (APPs -  Physician Assistants and Nurse Practitioners) who all work together to provide you with the care you need, when you need it.  We recommend signing up for the patient portal called "MyChart".  Sign up information is provided on this After Visit Summary.  MyChart is used to connect with patients for Virtual Visits (Telemedicine).  Patients are able to view lab/test results, encounter notes, upcoming appointments, etc.  Non-urgent messages can be sent to your provider as well.   To learn more about what you can do with MyChart, go to [email or web address].    Your next appointment:   6 month(s)  Provider:   Jodelle Red, MD or Gillian Shields, NP

## 2023-05-20 DIAGNOSIS — E782 Mixed hyperlipidemia: Secondary | ICD-10-CM | POA: Diagnosis not present

## 2023-05-20 DIAGNOSIS — I2510 Atherosclerotic heart disease of native coronary artery without angina pectoris: Secondary | ICD-10-CM | POA: Diagnosis not present

## 2023-05-21 ENCOUNTER — Encounter: Payer: Self-pay | Admitting: Adult Health

## 2023-05-26 DIAGNOSIS — E119 Type 2 diabetes mellitus without complications: Secondary | ICD-10-CM | POA: Diagnosis not present

## 2023-05-26 DIAGNOSIS — H2513 Age-related nuclear cataract, bilateral: Secondary | ICD-10-CM | POA: Diagnosis not present

## 2023-05-26 DIAGNOSIS — H40033 Anatomical narrow angle, bilateral: Secondary | ICD-10-CM | POA: Diagnosis not present

## 2023-05-26 DIAGNOSIS — H04123 Dry eye syndrome of bilateral lacrimal glands: Secondary | ICD-10-CM | POA: Diagnosis not present

## 2023-05-26 DIAGNOSIS — H40023 Open angle with borderline findings, high risk, bilateral: Secondary | ICD-10-CM | POA: Diagnosis not present

## 2023-06-11 ENCOUNTER — Other Ambulatory Visit (HOSPITAL_COMMUNITY): Payer: Self-pay

## 2023-06-11 ENCOUNTER — Telehealth (HOSPITAL_BASED_OUTPATIENT_CLINIC_OR_DEPARTMENT_OTHER): Payer: Self-pay | Admitting: Cardiology

## 2023-06-11 ENCOUNTER — Telehealth: Payer: Self-pay

## 2023-06-11 ENCOUNTER — Encounter: Payer: Self-pay | Admitting: Nurse Practitioner

## 2023-06-11 MED ORDER — DONEPEZIL HCL 10 MG PO TBDP: ORAL_TABLET | ORAL | 1 refills | Status: DC

## 2023-06-11 NOTE — Telephone Encounter (Signed)
I think this should be routed to yall, since you handle the PA's now?

## 2023-06-11 NOTE — Telephone Encounter (Addendum)
Insurance card copy faxed to [facility]. Received a receipt of confirmation. [phone]  Obtained fax number from [facility] by calling them and speaking with Molli Hazard.

## 2023-06-11 NOTE — Telephone Encounter (Signed)
Pt c/o medication issue:  1. Name of Medication:   Evolocumab (REPATHA) 140 MG/ML SOSY   2. How are you currently taking this medication (dosage and times per day)?    3. Are you having a reaction (difficulty breathing--STAT)?   4. What is your medication issue?   Caller (Anu) called stating she has clinical questions regarding patient's prior authorization for this medication.  Caller stated they also faxed questions to office.

## 2023-06-11 NOTE — Addendum Note (Signed)
Addended by: Bertram Savin on: 06/11/2023 10:50 AM   Modules accepted: Orders

## 2023-06-11 NOTE — Telephone Encounter (Signed)
      I have called and spoke with the pts plan and answered all additional questions verbally. The P/A for Repatha has been approved.   EFFECTIVE DATES: 7.1.24 - 8.28.25  Case Id# [id number]  I have also requested a fax with this detailed information to be sent via fax. Once received it will be attached to the patients chart.

## 2023-06-17 DIAGNOSIS — G4733 Obstructive sleep apnea (adult) (pediatric): Secondary | ICD-10-CM | POA: Diagnosis not present

## 2023-06-19 ENCOUNTER — Encounter: Payer: Self-pay | Admitting: Physician Assistant

## 2023-06-23 ENCOUNTER — Ambulatory Visit (INDEPENDENT_AMBULATORY_CARE_PROVIDER_SITE_OTHER): Payer: No Typology Code available for payment source | Admitting: Physician Assistant

## 2023-06-23 ENCOUNTER — Encounter: Payer: Self-pay | Admitting: Physician Assistant

## 2023-06-23 VITALS — BP 152/64 | HR 52 | Temp 97.7°F | Ht 66.0 in | Wt 140.4 lb

## 2023-06-23 DIAGNOSIS — M549 Dorsalgia, unspecified: Secondary | ICD-10-CM | POA: Diagnosis not present

## 2023-06-23 DIAGNOSIS — M2550 Pain in unspecified joint: Secondary | ICD-10-CM | POA: Diagnosis not present

## 2023-06-23 DIAGNOSIS — G8929 Other chronic pain: Secondary | ICD-10-CM

## 2023-06-23 MED ORDER — TRAMADOL HCL 50 MG PO TABS: 50.0000 mg | ORAL_TABLET | Freq: Two times a day (BID) | ORAL | 1 refills | Status: DC | PRN

## 2023-06-23 NOTE — Progress Notes (Signed)
Catherine Mcconnell is a 77 y.o. female here for a follow up of a pre-existing problem.  History of Present Illness:   Chief Complaint  Patient presents with   Leg Pain   Back Pain    HPI  She is accompanied by her daughter today.   Joint Pain  She states that she has been experiencing joint pain. She's been experiencing cramps in her calf.  She tends to experience the pain once she wakes up and starts moving.  She denies any accompanying numbness, tingling or weakness. She states that she has been icing the area to help relief her pain.  She typically takes Tylenol Arthritis but states that it has not been relieving her symptoms.  Her exercise levels has not changed or increased recently.  She was previously on Tramadol and is requesting a refill to take when she's experiencing a flare up.   She has been to [facility] for follow-up since November and had a good report  Back Pain She reports experiencing lower back pain every now and then.    She typically takes Tylenol Arthritis to help relief her symptoms.  Denies urinary symptom(s)   Past Medical History:  Diagnosis Date   Anemia    on meds   Arthritis    hips/wrists/back   Cataract    sx-bilateral   Chronic kidney disease    stage 3   Diabetes mellitus without complication (HCC)    on meds   Hypertension    on meds   Iron deficiency    on oral iron supplements, has not required transfusion   Mixed hyperlipidemia    on meds   OSA on CPAP    compliant   Sleep apnea    uses CPAP   Thyroid nodule    removed-not on meds at this time (12/01/2020)     Social History   Tobacco Use   Smoking status: Former    Current packs/day: 0.00    Average packs/day: 0.3 packs/day for 1 year (0.3 ttl pk-yrs)    Types: Cigarettes    Start date: 57    Quit date: 1960    Years since quitting: 64.7   Smokeless tobacco: Never   Tobacco comments:    only socially in high school  Vaping Use   Vaping status: Never Used   Substance Use Topics   Alcohol use: Not Currently   Drug use: Never    Past Surgical History:  Procedure Laterality Date   ABDOMINAL HYSTERECTOMY  1996   MASS EXCISION Right 2017   right arm mass removal   THYROID SURGERY     left side removal benign nodule   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   Procedure: RIGHT TOTAL HIP ARTHROPLASTY ANTERIOR APPROACH;  Surgeon: Tarry Kos, MD;  Location: [facility] OR;  Service: Orthopedics;  Laterality: Right;  3-C    Family History  Problem Relation Age of Onset   Stroke Mother    Hypertension Mother    Heart disease Mother    Heart attack Father    Early death Father    Hypertension Father    Diabetes Sister    Early death Sister    Hypertension Sister    Stroke Sister    Diabetes Sister    Early death Sister    Hyperlipidemia Sister    Kidney disease Sister    Diabetes Sister    Hyperlipidemia Sister    Hypertension Sister    Diabetes Sister  Early death Brother    Arthritis Brother    Heart attack Brother    Heart disease Brother    Arthritis Brother    Memory loss Paternal Uncle    Depression Daughter    Hypertension Daughter    Hyperlipidemia Daughter    Prostate cancer Son    Cancer Son    Early death Son    Cancer Niece        gyn cancer   Colon polyps Neg Hx    Colon cancer Neg Hx    Esophageal cancer Neg Hx    Rectal cancer Neg Hx    Stomach cancer Neg Hx    Sleep apnea Neg Hx     Allergies  Allergen Reactions   Lisinopril Rash   Metformin And Related Swelling    Current Medications:   Current Outpatient Medications:    amLODipine (NORVASC) 10 MG tablet, Take 1 tablet (10 mg total) by mouth daily., Disp: 100 tablet, Rfl: 3   aspirin EC 81 MG tablet, Take 1 tablet (81 mg total) by mouth daily. Swallow whole., Disp: 90 tablet, Rfl: 3   atorvastatin (LIPITOR) 80 MG tablet, Take 1 tablet by mouth every Monday, Wednesday, and Friday., Disp: 90 tablet, Rfl: 3   Blood Glucose Monitoring Suppl (ONE TOUCH  ULTRA MINI) w/Device KIT, Use to check blood sugars twice a day., Disp: 1 kit, Rfl: 0   carvedilol (COREG) 6.25 MG tablet, TAKE 1 TABLET BY MOUTH TWICE  DAILY WITH MEALS, Disp: 200 tablet, Rfl: 3   donepezil (ARICEPT ODT) 10 MG disintegrating tablet, DISSOLVE 1 TABLET ON THE TONGUE  AT BEDTIME, Disp: 100 tablet, Rfl: 1   Evolocumab (REPATHA) 140 MG/ML SOSY, Inject 140 mg into the skin every 14 (fourteen) days., Disp: 2.1 mL, Rfl: 26   FARXIGA 10 MG TABS tablet, Take 10 mg by mouth daily., Disp: , Rfl:    ferrous sulfate 325 (65 FE) MG EC tablet, Take 325 mg by mouth daily with breakfast., Disp: , Rfl:    gabapentin (NEURONTIN) 100 MG capsule, Take 200 mg by mouth at bedtime., Disp: , Rfl:    Lancets (ONETOUCH ULTRASOFT) lancets, CHECK TWICE DAILY BEFORE  BREAKFAST AND DINNER, Disp: 200 each, Rfl: 2   losartan (COZAAR) 50 MG tablet, Take 1 tablet (50 mg total) by mouth daily., Disp: 100 tablet, Rfl: 3   LUMIGAN 0.01 % SOLN, Place 1 drop into both eyes every evening., Disp: , Rfl:    mirtazapine (REMERON) 7.5 MG tablet, TAKE 1 TABLET(7.5 MG) BY MOUTH AT BEDTIME, Disp: 90 tablet, Rfl: 0   nitroGLYCERIN (NITROSTAT) 0.4 MG SL tablet, Place 1 tablet (0.4 mg total) under the tongue every 5 (five) minutes as needed for chest pain., Disp: 25 tablet, Rfl: 4   ONETOUCH ULTRA test strip, CHECK BLOOD SUGAR TWICE DAILY, Disp: 200 strip, Rfl: 2   Semaglutide 7 MG TABS, Take 1 tablet by mouth in the morning., Disp: , Rfl:    traMADol (ULTRAM) 50 MG tablet, Take 1 tablet (50 mg total) by mouth every 12 (twelve) hours as needed for severe pain., Disp: 30 tablet, Rfl: 1   Review of Systems:   Review of Systems  Musculoskeletal:  Positive for back pain and joint pain.    Vitals:   Vitals:   06/23/23 0957 06/23/23 1007 06/23/23 1040  BP: (!) 170/70 (!) 170/70 (!) 152/64  Pulse: (!) 52    Temp: 97.7 F (36.5 C)    SpO2: 99%    Weight:  140 lb 6.4 oz (63.7 kg)    Height: 5\' 6"  (1.676 m)       Body mass  index is 22.66 kg/m.  Physical Exam:   Physical Exam Vitals and nursing note reviewed.  Constitutional:      General: She is not in acute distress.    Appearance: She is well-developed. She is not ill-appearing or toxic-appearing.  Cardiovascular:     Rate and Rhythm: Normal rate and regular rhythm.     Pulses: Normal pulses.          Dorsalis pedis pulses are 2+ on the right side and 2+ on the left side.     Heart sounds: Normal heart sounds, S1 normal and S2 normal.  Pulmonary:     Effort: Pulmonary effort is normal.     Breath sounds: Normal breath sounds.  Musculoskeletal:     Comments: Tenderness to palpation to right lumbar region  Bilateral calfs without tenderness to palpation, swelling  Skin:    General: Skin is warm and dry.  Neurological:     Mental Status: She is alert.     GCS: GCS eye subscore is 4. GCS verbal subscore is 5. GCS motor subscore is 6.     Comments: Normal sensation and gait  Psychiatric:        Speech: Speech normal.        Behavior: Behavior normal. Behavior is cooperative.     Assessment and Plan:   Arthralgia, unspecified joint; Chronic right-sided back pain, unspecified back location No red flags on my exam today We will continue to have her use Tylenol arthritis for her ongoing symptoms We will prescribe as needed tramadol for her breakthrough of severe or worsening pain She notes that she is not allowed to drive while taking this medication If any new or worsening pain, she is to follow-up with [place] for further evaluation  I,Safa M Kadhim,acting as a scribe for [employer], PA.,have documented all relevant documentation on the behalf of Jarold Motto, PA,as directed by  Jarold Motto, PA while in the presence of Jarold Motto, [place].   I, Jarold Motto, [place], have reviewed all documentation for this visit. The documentation on 06/23/23 for the exam, diagnosis, procedures, and orders are all accurate and complete.   Jarold Motto, PA-C

## 2023-07-15 ENCOUNTER — Other Ambulatory Visit: Payer: Self-pay | Admitting: *Deleted

## 2023-07-15 ENCOUNTER — Other Ambulatory Visit: Payer: Self-pay | Admitting: Physician Assistant

## 2023-07-15 MED ORDER — MIRTAZAPINE 7.5 MG PO TABS: 7.5000 mg | ORAL_TABLET | Freq: Every day | ORAL | 0 refills | Status: DC

## 2023-07-15 MED ORDER — ONETOUCH ULTRA VI STRP: ORAL_STRIP | 4 refills | Status: DC

## 2023-07-15 MED ORDER — ONETOUCH ULTRASOFT LANCETS MISC: 4 refills | Status: DC

## 2023-07-22 ENCOUNTER — Encounter: Payer: Self-pay | Admitting: Adult Health

## 2023-07-22 ENCOUNTER — Encounter: Payer: Self-pay | Admitting: Physician Assistant

## 2023-07-22 MED ORDER — GABAPENTIN 100 MG PO CAPS: 200.0000 mg | ORAL_CAPSULE | Freq: Every day | ORAL | 0 refills | Status: DC

## 2023-07-30 ENCOUNTER — Other Ambulatory Visit: Payer: Self-pay | Admitting: *Deleted

## 2023-07-30 MED ORDER — SEMAGLUTIDE 7 MG PO TABS: 1.0000 | ORAL_TABLET | Freq: Every morning | ORAL | 1 refills | Status: DC

## 2023-07-30 MED ORDER — MIRTAZAPINE 7.5 MG PO TABS: 7.5000 mg | ORAL_TABLET | Freq: Every day | ORAL | 0 refills | Status: DC

## 2023-08-13 DIAGNOSIS — N1832 Chronic kidney disease, stage 3b: Secondary | ICD-10-CM | POA: Diagnosis not present

## 2023-08-14 LAB — LAB REPORT - SCANNED
Creatinine, POC: 83.3 mg/dL
EGFR: 30

## 2023-08-15 ENCOUNTER — Encounter: Payer: Self-pay | Admitting: Physician Assistant

## 2023-08-15 NOTE — Telephone Encounter (Signed)
Spoke to pt's daughter Avanell Shackleton about message sent about diarrhea and iron pills. Asked her if she has been having diarrhea since starting iron medication? Avanell Shackleton said she is not sure pt just mentioned it to her last night. Told her to have pt stop iron until appt on 11/8 and will discuss at appt. Willimena verbalized understanding.  Called Willimena back and asked her if pt can come in on Tues. 11/5 at 2:00 PM instead of Friday? Avanell Shackleton said that would be fine. Told her okay I will cancel appt for Friday and move it to Tuesday. Willimena verbalized understanding.

## 2023-08-19 ENCOUNTER — Encounter: Payer: Self-pay | Admitting: Physician Assistant

## 2023-08-19 ENCOUNTER — Ambulatory Visit (INDEPENDENT_AMBULATORY_CARE_PROVIDER_SITE_OTHER): Payer: No Typology Code available for payment source | Admitting: Physician Assistant

## 2023-08-19 VITALS — BP 110/70 | HR 53 | Temp 97.5°F | Ht 66.0 in | Wt 141.2 lb

## 2023-08-19 DIAGNOSIS — Z0000 Encounter for general adult medical examination without abnormal findings: Secondary | ICD-10-CM

## 2023-08-19 DIAGNOSIS — E782 Mixed hyperlipidemia: Secondary | ICD-10-CM | POA: Diagnosis not present

## 2023-08-19 DIAGNOSIS — E119 Type 2 diabetes mellitus without complications: Secondary | ICD-10-CM | POA: Diagnosis not present

## 2023-08-19 DIAGNOSIS — N1832 Chronic kidney disease, stage 3b: Secondary | ICD-10-CM | POA: Diagnosis not present

## 2023-08-19 DIAGNOSIS — Z9189 Other specified personal risk factors, not elsewhere classified: Secondary | ICD-10-CM | POA: Diagnosis not present

## 2023-08-19 DIAGNOSIS — F0280 Dementia in other diseases classified elsewhere without behavioral disturbance: Secondary | ICD-10-CM

## 2023-08-19 DIAGNOSIS — G301 Alzheimer's disease with late onset: Secondary | ICD-10-CM

## 2023-08-19 DIAGNOSIS — Z7984 Long term (current) use of oral hypoglycemic drugs: Secondary | ICD-10-CM | POA: Diagnosis not present

## 2023-08-19 DIAGNOSIS — L603 Nail dystrophy: Secondary | ICD-10-CM

## 2023-08-19 DIAGNOSIS — Z23 Encounter for immunization: Secondary | ICD-10-CM | POA: Diagnosis not present

## 2023-08-19 DIAGNOSIS — E559 Vitamin D deficiency, unspecified: Secondary | ICD-10-CM

## 2023-08-19 NOTE — Patient Instructions (Addendum)
It was great to see you!  Likely decrease Rybelsus -- I will let you know  Referral for bone density placed for [facility]  Please go to the lab for blood work.   Our office will call you with your results unless you have chosen to receive results via MyChart.  If your blood work is normal we will follow-up each year for physicals and as scheduled for chronic medical problems.  If anything is abnormal we will treat accordingly and get you in for a follow-up.  Take care,  Catherine Mcconnell

## 2023-08-19 NOTE — Progress Notes (Signed)
Catherine Mcconnell is a 77 y.o. female and is here for a comprehensive physical exam.  HPI  Health Maintenance Due  Topic Date Due   Diabetic kidney evaluation - Urine ACR  06/06/2021   DEXA SCAN  08/12/2021   Diabetic kidney evaluation - eGFR measurement  08/24/2023   Chief Complaint  Patient presents with   Annual Exam   Diarrhea    Pt has been having diarrhea ever since started on Iron. Pt stopped Iron on 11/1 and 2 days later diarrhea stopped.   Acute Concerns: None  Chronic Issues: Diabetes: Managed/Compliant with 10 mg Farxiga and 7 mg Semaglutide daily.  Daughter reports her diet has improved to eating more varied foods. Monitored at home. This morning's reading was 115.  Denies hypoglycemic or hyperglycemic episodes or symptoms.   Lab Results  Component Value Date   HGBA1C 6.8 (A) 02/18/2023   Notes that she does regularly lotion her feet.  Diabetic Foot Exam - Simple   Simple Foot Form Diabetic Foot exam was performed with the following findings: Yes 08/19/2023  3:18 PM  Visual Inspection No deformities, no ulcerations, no other skin breakdown bilaterally: Yes Sensation Testing Intact to touch and monofilament testing bilaterally: Yes Pulse Check Posterior Tibialis and Dorsalis pulse intact bilaterally: Yes Comments Pt's feet are dry and scaly, toenails need to be trimmed. Podiatry referral placed.    B12 Deficiency: Daughter reports that 7/17 pt received a B12 injection and started oral B12 supplement.  Would like to have that checked today. She stopped taking her iron supplement as she felt like this was causing diarrhea -- once she stopped this, her symptoms resolved.  Alzheimer's Dementia : Managed with Donepezil 10 mg and Gabapentin 200 mg at nighttime; followed by neurology. Endorses symptom stability with medications.  Reports that she had stopped going to the senior center but is thinking about going back for bingo nights.  Denies fogginess.   Health  Maintenance: Immunizations -- utd Colonoscopy-- Last done 12/15/2020 non-bleeding external and internal hemorrhoids, otherwise normal. Repeat 2032. Mammogram-- Last done 12/14/22. Results were normal. Repeat 2025. PAP-- Last done 12/29/2010 (aged out) Bone Density-- Last done 08/12/18 (overdue since 2021). Results were normal.  Diet -- Healthy overall, eat more variety than peanut butter crackers Exercise -- Regular exercise  Sleep habits -- Good sleep quality Mood -- Stable  UTD with dentist? - Yes UTD with eye doctor? - Yes  Weight history: Wt Readings from Last 10 Encounters:  08/19/23 141 lb 4 oz (64.1 kg)  06/23/23 140 lb 6.4 oz (63.7 kg)  05/15/23 137 lb 9.6 oz (62.4 kg)  04/30/23 143 lb 6.4 oz (65 kg)  03/17/23 135 lb (61.2 kg)  02/18/23 143 lb (64.9 kg)  02/12/23 140 lb 3.2 oz (63.6 kg)  12/31/22 142 lb 8 oz (64.6 kg)  12/17/22 140 lb (63.5 kg)  11/26/22 133 lb 6.4 oz (60.5 kg)   Body mass index is 22.8 kg/m. No LMP recorded. Patient has had a hysterectomy.  Alcohol use:  reports that she does not currently use alcohol.  Tobacco use:  Tobacco Use: Medium Risk (08/19/2023)   Patient History    Smoking Tobacco Use: Former    Smokeless Tobacco Use: Never    Passive Exposure: Not on file   Eligible for lung cancer screening? no     08/19/2023    2:25 PM  Depression screen PHQ 2/9  Decreased Interest 0  Down, Depressed, Hopeless 0  PHQ - 2 Score 0  Altered sleeping 0  Tired, decreased energy 1  Change in appetite 2  Feeling bad or failure about yourself  0  Trouble concentrating 0  Moving slowly or fidgety/restless 0  Suicidal thoughts 0  PHQ-9 Score 3  Difficult doing work/chores Somewhat difficult   Other providers/specialists: Patient Care Team: Jarold Motto, [place] as PCP - General (Physician Assistant) Jodelle Red, MD as PCP - Cardiology (Cardiology) Elder Negus, MD as Consulting Physician (Cardiology) Bufford Buttner, MD as  Consulting Physician (Nephrology) Arnette Felts, FNP (General Practice) Dohmeier, Porfirio Mylar, MD as Consulting Physician (Neurology) [facility] as Consulting Physician   PMHx, SurgHx, SocialHx, Medications, and Allergies were reviewed in the Visit Navigator and updated as appropriate.   Past Medical History:  Diagnosis Date   Anemia    on meds   Arthritis    hips/wrists/back   Cataract    sx-bilateral   Chronic kidney disease    stage 3   Diabetes mellitus without complication (HCC)    on meds   Heart murmur    Hypertension    on meds   Iron deficiency    on oral iron supplements, has not required transfusion   Mixed hyperlipidemia    on meds   OSA on CPAP    compliant   Sleep apnea    uses CPAP   Thyroid nodule    removed-not on meds at this time (12/01/2020)    Past Surgical History:  Procedure Laterality Date   ABDOMINAL HYSTERECTOMY  1996   EYE SURGERY     JOINT REPLACEMENT  08/06/21   MASS EXCISION Right 2017   right arm mass removal   THYROID SURGERY     left side removal benign nodule   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   Procedure: RIGHT TOTAL HIP ARTHROPLASTY ANTERIOR APPROACH;  Surgeon: Tarry Kos, MD;  Location: [facility] OR;  Service: Orthopedics;  Laterality: Right;  3-C   Family History  Problem Relation Age of Onset   Stroke Mother    Hypertension Mother    Heart disease Mother    Heart attack Father    Early death Father    Hypertension Father    Diabetes Sister    Early death Sister    Hypertension Sister    Stroke Sister    Diabetes Sister    Early death Sister    Hyperlipidemia Sister    Kidney disease Sister    Diabetes Sister    Hyperlipidemia Sister    Hypertension Sister    Diabetes Sister    Early death Brother    Arthritis Brother    Heart attack Brother    Heart disease Brother    Arthritis Brother    Memory loss Paternal Uncle    Depression Daughter    Hypertension Daughter    Hyperlipidemia  Daughter    Prostate cancer Son    Cancer Son    Early death Son    Cancer Niece        gyn cancer   Colon polyps Neg Hx    Colon cancer Neg Hx    Esophageal cancer Neg Hx    Rectal cancer Neg Hx    Stomach cancer Neg Hx    Sleep apnea Neg Hx    Social History   Tobacco Use   Smoking status: Former    Current packs/day: 0.00    Average packs/day: 0.3 packs/day for 1 year (0.3 ttl pk-yrs)    Types: Cigarettes  Start date: 18    Quit date: 102    Years since quitting: 95.8   Smokeless tobacco: Never   Tobacco comments:    Smoked briefly in my twenties  Vaping Use   Vaping status: Never Used  Substance Use Topics   Alcohol use: Not Currently   Drug use: Never   Review of Systems:   Review of Systems  Constitutional:  Negative for chills, fever, malaise/fatigue and weight loss.  HENT:  Negative for hearing loss, sinus pain and sore throat.   Respiratory:  Negative for cough and hemoptysis.   Cardiovascular:  Negative for chest pain, palpitations, leg swelling and PND.  Gastrointestinal:  Negative for abdominal pain, constipation, diarrhea, heartburn, nausea and vomiting.  Genitourinary:  Negative for dysuria, frequency and urgency.  Musculoskeletal:  Negative for back pain, myalgias and neck pain.  Skin:  Negative for itching and rash.  Neurological:  Negative for dizziness, tingling, seizures and headaches.  Endo/Heme/Allergies:  Negative for polydipsia.  Psychiatric/Behavioral:  Negative for depression. The patient is not nervous/anxious.       Objective:   BP 110/70 (BP Location: Left Arm, Patient Position: Sitting, Cuff Size: Normal)   Pulse (!) 53   Temp (!) 97.5 F (36.4 C) (Temporal)   Ht 5\' 6"  (1.676 m)   Wt 141 lb 4 oz (64.1 kg)   SpO2 98%   BMI 22.80 kg/m  Body mass index is 22.8 kg/m.   General Appearance:    Alert, cooperative, no distress, appears stated age  Head:    Normocephalic, without obvious abnormality, atraumatic  Eyes:     PERRL, conjunctiva/corneas clear, EOM's intact, fundi    benign, both eyes  Ears:    Normal TM's and external ear canals, both ears  Nose:   Nares normal, septum midline, mucosa normal, no drainage    or sinus tenderness  Throat:   Lips, mucosa, and tongue normal; teeth and gums normal  Neck:   Supple, symmetrical, trachea midline, no adenopathy;    thyroid:  no enlargement/tenderness/nodules; no carotid   bruit or JVD  Back:     Symmetric, no curvature, ROM normal, no CVA tenderness  Lungs:     Clear to auscultation bilaterally, respirations unlabored  Chest Wall:    No tenderness or deformity   Heart:    Regular rate and rhythm, S1 and S2 normal, no murmur, rub or gallop  Breast Exam:    Deferred  Abdomen:     Soft, non-tender, bowel sounds active all four quadrants,    no masses, no organomegaly  Genitalia:    Deferred  Extremities:   Extremities normal, atraumatic, no cyanosis or edema  Pulses:   2+ and symmetric all extremities  Skin:   Skin color, texture, turgor normal, no rashes or lesions  Lymph nodes:   Cervical, supraclavicular, and axillary nodes normal  Neurologic:   CNII-XII intact, normal strength, sensation and reflexes    throughout    Assessment/Plan:   Routine physical examination Today patient counseled on age appropriate routine health concerns for screening and prevention, each reviewed and up to date or declined. Immunizations reviewed and up to date or declined. Labs ordered and reviewed. Risk factors for depression reviewed and negative. Hearing function and visual acuity are intact. ADLs screened and addressed as needed. Functional ability and level of safety reviewed and appropriate. Education, counseling and referrals performed based on assessed risks today. Patient provided with a copy of personalized plan for preventive services.  Stage 3b  chronic kidney disease (HCC) Continues to closely follow up with nephrology Will update blood work today Recommend  oral iron supplementation 1-2 times per week if tolerated  Mixed hyperlipidemia Update lipid panel Continue Lipitor 80 mg daily  Diabetes mellitus without complication (HCC) Update A1c Will likely decrease Rybelsus to 3 mg daily if able Continue farxiga 10 mg daily Follow-up in 6 month(s), sooner if concerns  At risk for loss of bone density Will place referral for DEXA  Dystrophic nail Will refer to podiatry  Vitamin D deficiency Update vitamin D and provide recommendations  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  I, Jarold Motto, [place], have reviewed all documentation for this visit. The documentation on 08/19/23 for the exam, diagnosis, procedures, and orders are all accurate and complete.  Jarold Motto, PA-C [facility]

## 2023-08-20 ENCOUNTER — Encounter: Payer: Self-pay | Admitting: Nurse Practitioner

## 2023-08-20 ENCOUNTER — Encounter: Payer: Self-pay | Admitting: Physician Assistant

## 2023-08-20 DIAGNOSIS — D631 Anemia in chronic kidney disease: Secondary | ICD-10-CM | POA: Diagnosis not present

## 2023-08-20 DIAGNOSIS — N2581 Secondary hyperparathyroidism of renal origin: Secondary | ICD-10-CM | POA: Diagnosis not present

## 2023-08-20 DIAGNOSIS — E1122 Type 2 diabetes mellitus with diabetic chronic kidney disease: Secondary | ICD-10-CM | POA: Diagnosis not present

## 2023-08-20 DIAGNOSIS — I129 Hypertensive chronic kidney disease with stage 1 through stage 4 chronic kidney disease, or unspecified chronic kidney disease: Secondary | ICD-10-CM | POA: Diagnosis not present

## 2023-08-20 DIAGNOSIS — N1832 Chronic kidney disease, stage 3b: Secondary | ICD-10-CM | POA: Diagnosis not present

## 2023-08-20 LAB — IBC + FERRITIN
Ferritin: 226 ng/mL (ref 10.0–291.0)
Iron: 55 ug/dL (ref 42–145)
Saturation Ratios: 18.7 % — ABNORMAL LOW (ref 20.0–50.0)
TIBC: 294 ug/dL (ref 250.0–450.0)
Transferrin: 210 mg/dL — ABNORMAL LOW (ref 212.0–360.0)

## 2023-08-20 LAB — COMPREHENSIVE METABOLIC PANEL
ALT: 17 U/L (ref 0–35)
AST: 19 U/L (ref 0–37)
Albumin: 4.2 g/dL (ref 3.5–5.2)
Alkaline Phosphatase: 47 U/L (ref 39–117)
BUN: 28 mg/dL — ABNORMAL HIGH (ref 6–23)
CO2: 27 meq/L (ref 19–32)
Calcium: 9.6 mg/dL (ref 8.4–10.5)
Chloride: 102 meq/L (ref 96–112)
Creatinine, Ser: 1.99 mg/dL — ABNORMAL HIGH (ref 0.40–1.20)
GFR: 23.87 mL/min — ABNORMAL LOW (ref >60.00)
Glucose, Bld: 107 mg/dL — ABNORMAL HIGH (ref 70–99)
Potassium: 4.3 meq/L (ref 3.5–5.1)
Sodium: 137 meq/L (ref 135–145)
Total Bilirubin: 0.5 mg/dL (ref 0.2–1.2)
Total Protein: 6.7 g/dL (ref 6.0–8.3)

## 2023-08-20 LAB — CBC WITH DIFFERENTIAL/PLATELET
Basophils Absolute: 0 10*3/uL (ref 0.0–0.1)
Basophils Relative: 1.3 % (ref 0.0–3.0)
Eosinophils Absolute: 0.2 10*3/uL (ref 0.0–0.7)
Eosinophils Relative: 6 % — ABNORMAL HIGH (ref 0.0–5.0)
HCT: 34.5 % — ABNORMAL LOW (ref 36.0–46.0)
Hemoglobin: 11.5 g/dL — ABNORMAL LOW (ref 12.0–15.0)
Lymphocytes Relative: 34.6 % (ref 12.0–46.0)
Lymphs Abs: 1 10*3/uL (ref 0.7–4.0)
MCHC: 33.3 g/dL (ref 30.0–36.0)
MCV: 89.6 fL (ref 78.0–100.0)
Monocytes Absolute: 0.2 10*3/uL (ref 0.1–1.0)
Monocytes Relative: 7.9 % (ref 3.0–12.0)
Neutro Abs: 1.5 10*3/uL (ref 1.4–7.7)
Neutrophils Relative %: 50.2 % (ref 43.0–77.0)
Platelets: 168 10*3/uL (ref 150.0–400.0)
RBC: 3.85 Mil/uL — ABNORMAL LOW (ref 3.87–5.11)
RDW: 13 % (ref 11.5–15.5)
WBC: 2.9 10*3/uL — ABNORMAL LOW (ref 4.0–10.5)

## 2023-08-20 LAB — VITAMIN B12: Vitamin B-12: >1537 pg/mL — ABNORMAL HIGH (ref 211–911)

## 2023-08-20 LAB — MICROALBUMIN / CREATININE URINE RATIO
Creatinine,U: 100.2 mg/dL
Microalb Creat Ratio: 12.4 mg/g (ref 0.0–30.0)
Microalb, Ur: 12.5 mg/dL — ABNORMAL HIGH (ref 0.0–1.9)

## 2023-08-20 LAB — LIPID PANEL
Cholesterol: 173 mg/dL (ref 0–200)
HDL: 63.5 mg/dL (ref >39.00)
LDL Cholesterol: 84 mg/dL (ref 0–99)
NonHDL: 109.54
Total CHOL/HDL Ratio: 3
Triglycerides: 130 mg/dL (ref 0.0–149.0)
VLDL: 26 mg/dL (ref 0.0–40.0)

## 2023-08-20 LAB — VITAMIN D 25 HYDROXY (VIT D DEFICIENCY, FRACTURES): VITD: 83.72 ng/mL (ref 30.00–100.00)

## 2023-08-20 LAB — HEMOGLOBIN A1C: Hgb A1c MFr Bld: 8.2 % — ABNORMAL HIGH (ref 4.6–6.5)

## 2023-08-21 NOTE — Telephone Encounter (Signed)
Received lab work. I put it in your folder to review. When you are done give it back to me so I can abstract.

## 2023-08-22 ENCOUNTER — Encounter: Payer: No Typology Code available for payment source | Admitting: Physician Assistant

## 2023-08-22 ENCOUNTER — Encounter: Payer: Self-pay | Admitting: Podiatry

## 2023-08-22 ENCOUNTER — Ambulatory Visit (INDEPENDENT_AMBULATORY_CARE_PROVIDER_SITE_OTHER): Payer: No Typology Code available for payment source | Admitting: Podiatry

## 2023-08-22 VITALS — Ht 66.0 in | Wt 141.2 lb

## 2023-08-22 DIAGNOSIS — B351 Tinea unguium: Secondary | ICD-10-CM | POA: Insufficient documentation

## 2023-08-22 DIAGNOSIS — M79674 Pain in right toe(s): Secondary | ICD-10-CM

## 2023-08-22 DIAGNOSIS — Z794 Long term (current) use of insulin: Secondary | ICD-10-CM | POA: Diagnosis not present

## 2023-08-22 DIAGNOSIS — M79675 Pain in left toe(s): Secondary | ICD-10-CM | POA: Diagnosis not present

## 2023-08-22 DIAGNOSIS — N1832 Chronic kidney disease, stage 3b: Secondary | ICD-10-CM

## 2023-08-22 DIAGNOSIS — E1165 Type 2 diabetes mellitus with hyperglycemia: Secondary | ICD-10-CM

## 2023-08-22 NOTE — Progress Notes (Signed)
This patient returns to my office for at risk foot care.  This patient requires this care by a professional since this patient will be at risk due to having diabetes and CKD.   This patient is unable to cut nails herself since the patient cannot reach hernails.These nails are painful walking and wearing shoes.  This patient presents for at risk foot care today.  General Appearance  Alert, conversant and in no acute stress.  Vascular  Dorsalis pedis and posterior tibial  pulses are  weakly palpable  bilaterally.  Capillary return is within normal limits  bilaterally. Temperature is within normal limits  bilaterally.  Neurologic  Senn-Weinstein monofilament wire test within normal limits  bilaterally. Muscle power within normal limits bilaterally.  Nails Thick disfigured discolored nails with subungual debris  from hallux to fifth toes bilaterally. No evidence of bacterial infection or drainage bilaterally.  Orthopedic  No limitations of motion  feet .  No crepitus or effusions noted.  No bony pathology or digital deformities noted. Midfoot  DJD  B/L.  Skin  normotropic skin with no porokeratosis noted bilaterally.  No signs of infections or ulcers noted.     Onychomycosis  Pain in right toes  Pain in left toes  Consent was obtained for treatment procedures.   Mechanical debridement of nails 1-5  bilaterally performed with a nail nipper.  Filed with dremel without incident.    Return office visit     prn                Told patient to return for periodic foot care and evaluation due to potential at risk complications.   Helane Gunther DPM

## 2023-09-08 ENCOUNTER — Telehealth (HOSPITAL_BASED_OUTPATIENT_CLINIC_OR_DEPARTMENT_OTHER): Payer: Self-pay | Admitting: Physician Assistant

## 2023-09-09 ENCOUNTER — Encounter (HOSPITAL_BASED_OUTPATIENT_CLINIC_OR_DEPARTMENT_OTHER): Payer: Self-pay

## 2023-09-16 ENCOUNTER — Other Ambulatory Visit (HOSPITAL_BASED_OUTPATIENT_CLINIC_OR_DEPARTMENT_OTHER): Payer: No Typology Code available for payment source

## 2023-09-22 ENCOUNTER — Telehealth: Payer: Self-pay | Admitting: *Deleted

## 2023-09-22 NOTE — Telephone Encounter (Addendum)
Pt's daughter returning phone call and would like a call back.

## 2023-09-22 NOTE — Telephone Encounter (Signed)
Received documentation [employer] that delivery was not made of pt bipap.

## 2023-09-22 NOTE — Telephone Encounter (Signed)
I called [employer] and they said that pt will be disenrolling in [employer] at the beginning of the year.  [employer] denied coverage of bipap supplies. They should receive letter concerning this as well..  You have appt 10-20-2023 with [facility] NP.  May call back to discuss / questions.

## 2023-09-23 ENCOUNTER — Ambulatory Visit (HOSPITAL_BASED_OUTPATIENT_CLINIC_OR_DEPARTMENT_OTHER)
Admission: RE | Admit: 2023-09-23 | Discharge: 2023-09-23 | Disposition: A | Discharge status: Home / Self Care | Payer: No Typology Code available for payment source | Source: Ambulatory Visit | Attending: Physician Assistant | Admitting: Physician Assistant

## 2023-09-23 DIAGNOSIS — Z13820 Encounter for screening for osteoporosis: Secondary | ICD-10-CM | POA: Diagnosis not present

## 2023-09-23 DIAGNOSIS — Z9189 Other specified personal risk factors, not elsewhere classified: Secondary | ICD-10-CM | POA: Insufficient documentation

## 2023-09-23 DIAGNOSIS — Z780 Asymptomatic menopausal state: Secondary | ICD-10-CM | POA: Diagnosis not present

## 2023-09-25 ENCOUNTER — Encounter: Payer: Self-pay | Admitting: Physician Assistant

## 2023-09-26 ENCOUNTER — Other Ambulatory Visit: Payer: Self-pay

## 2023-09-26 ENCOUNTER — Telehealth: Payer: Self-pay

## 2023-09-26 MED ORDER — ONETOUCH ULTRASOFT LANCETS MISC: 4 refills | Status: DC

## 2023-09-26 MED ORDER — MIRTAZAPINE 7.5 MG PO TABS
7.5000 mg | ORAL_TABLET | Freq: Every day | ORAL | 0 refills | Status: DC
  Filled 2023-11-18: qty 90, 90d supply, fill #0

## 2023-09-26 MED ORDER — SEMAGLUTIDE 7 MG PO TABS: 1.0000 | ORAL_TABLET | Freq: Every morning | ORAL | 1 refills | Status: DC

## 2023-09-26 NOTE — Telephone Encounter (Signed)
Pt requesting refill of Rybelsus, lancets and mirtazapine (REMERON) 7.5 MG already sent for patient. Please advise on this refill for patient.

## 2023-09-26 NOTE — Addendum Note (Signed)
Addended by: Haynes Bast on: 09/26/2023 03:00 PM   Modules accepted: Orders

## 2023-10-10 ENCOUNTER — Other Ambulatory Visit: Payer: Self-pay | Admitting: Physician Assistant

## 2023-10-10 NOTE — Telephone Encounter (Signed)
Catherine Mcconnell, call representative, called back to clarify that patient is out of this medication. There was an error in previous message.

## 2023-10-10 NOTE — Telephone Encounter (Signed)
Copied from CRM [id number]. Topic: Clinical - Medication Refill >> Oct 10, 2023  1:18 PM Denese Killings wrote: Most Recent Primary Care Visit:  Provider: Jarold Motto  Department: [facility]  Visit Type: PHYSICAL  Date: 08/19/2023  Medication: Semaglutide 14 MG TABS  Please see 11/05 note to send 14 mg over instead of 7 Pharmacy wont fill it. It is too soon for the 7 mg  Has the patient contacted their pharmacy? Yes (Agent: If no, request that the patient contact the pharmacy for the refill. If patient does not wish to contact the pharmacy document the reason why and proceed with request.) (Agent: If yes, when and what did the pharmacy advise?)  Is this the correct pharmacy for this prescription? Yes If no, delete pharmacy and type the correct one.  This is the patient's preferred pharmacy:  [id number] - [place], [place] - [address] [place] [address] Phone: [phone] Fax: [phone]   Has the prescription been filled recently? No  Is the patient out of the medication? No  Has the patient been seen for an appointment in the last year OR does the patient have an upcoming appointment? Yes  Can we respond through MyChart? Yes  Agent: Please be advised that Rx refills may take up to 3 business days. We ask that you follow-up with your pharmacy.

## 2023-10-16 NOTE — Progress Notes (Deleted)
 PATIENT: Catherine Mcconnell DOB: [birth year]  REASON FOR VISIT: follow up HISTORY FROM: patient Primary neurologist: Dr. Chalice  No chief complaint on file.    HISTORY OF PRESENT ILLNESS: Today 10/19/23:  Catherine Mcconnell is a 78 y.o. female with a history of ***. Returns today for follow-up.        03/17/23: Catherine Mcconnell is a 78 y.o. female with a history of OSA on Bipap and Memory disturbance. Returns today for follow-up.  She is here today with her daughter.  She reports that they changed out the mask earlier in May.  Patient still has a leak.  She does not wear her dentures when she is sleeping.  Her download is below       02/27/22: Catherine Mcconnell is a 78 year old female with a history of obstructive sleep apnea on CPAP and memory disturbance.  She returns today for follow-up.  OSA On CPAP: DL is below. Wants to try full facemask. Otherwise no compliants.   Memory: Better, currently on Aricept . Reports that she is sleeping better and memory has been better. Lives with daughter- going to [place]  to spend the summer with family. Able to complete ADLS independently. Manages own medications, appts and finances.     10/24/21: Catherine Mcconnell is a 78 year old female with a history of obstructive sleep apnea on CPAP and memory disturbance.  She returns today for follow-up.  She is with her daughter.  Patient states that she does not sleep all night long.  States that she typically wakes up and stays up for several hours.  If she goes back to sleep during the day she does not use a CPAP.  Patient had hip replacement in October and daughter states that her schedule has been altered since then.  Patient feels that her memory has remained relatively stable.  She lives with her daughter.  Daughter helps her with her medications.  She reports that she manages her own finances.  She continues on Aricept  10 mg at bedtime.      04/12/21: Catherine Mcconnell is a 78 year old female with a history of obstructive sleep  apnea on CPAP and memory disturbance.  She returns today for follow-up.  She reports that the CPAP works well.  She denies any new issues.  She feels that her memory has remained stable.  She is able to complete all ADLs independently.  She lives with her daughter.  She manages her own medications and appointments.  She is able to operate a motor vehicle.  She currently takes Aricept  5 mg at bedtime.    01/02/21: Catherine Mcconnell  is a 78 year old female with a history of obstructive sleep apnea and memory disturbance.  She returns today for follow-up.  She reports that her memory has been relatively stable.  She does not feel that it is gotten any worse.  She lives with her daughter.  Her daughter was unable to attend this visit.  She states that on occasion she feels brain fog but this is inconsistent versus consistent.  She is able to complete all ADLs independently.  She does operate a librarian, academic.  Denies any trouble driving but she reports that she does use a GPS consistently to be safe.  She denies any troubles managing her finances.  Reports that she continues to cook meals without difficulty.  She is currently on no medication for her memory.  She continues to use the CPAP consistently.  HISTORY (Copied from Dr.Dohmeier's note) New problem for  this established sleep patient, last seen by NP- Televisit.  Catherine Mcconnell is a 78 year- old african -american . female is seen on 03-16-2020 with her daughter. According to her daughter Catherine Mcconnell has forgotten to pay her phone bill which has never happened before, she has gotten lost driving in town on familiar roads.  There were no aggravating factors such as bad weather or nighttime driving.  These events have left Catherine Mcconnell a little bit shaken. Based on these concerns her physician assistant Mrs. Job has sent the patient for follow-up.     The memory loss is accompanied by a rather poorly controlled diabetes still the HbA1c was 8.4 in April of this  year, she has a past medical history of chronic kidney disease which is not graded, arthritis, heart iron deficiency, hypertension, mixed hyperlipidemia and she has a history of a thyroid  nodule which was surgically removed. She has a past surgical history of has to hysterectomy in 1996 thyroid  surgery a biopsy revealed a benign nodule.  And she had a skin mass removed from her right upper arm in 2017 which  was not cancerous.    She is followed here in our sleep clinic for OSA obstructive sleep apnea on CPAP.  Just to stay for another visit she has been 100% compliant by days, 97% compliant by time average use at time of 6 hours 51 minutes, CPAP is set to 11 cm water  pressure with 3 cm EPR her residual apnea-hypopnea index is 2.4/h very good she does have some central apneas arising but overall the main problem may be an air leak but not a lack of control of apnea.  95th percentile air leak was 55 L/min which means that the mask gets dislodged sometimes.   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      REVIEW OF SYSTEMS: Out of a complete 14 system review of symptoms, the patient complains only of the following symptoms, and all other reviewed systems are negative.  ESS 16   ALLERGIES: Allergies  Allergen Reactions   Lisinopril Rash   Metformin  And Related Swelling    HOME MEDICATIONS: Outpatient Medications Prior to Visit  Medication Sig Dispense Refill   amLODipine  (NORVASC ) 10 MG tablet Take 1 tablet (10 mg total) by mouth daily. 100 tablet 3   aspirin  EC 81 MG tablet Take 1 tablet (81 mg total) by mouth daily. Swallow whole. 90 tablet 3   atorvastatin  (LIPITOR) 80 MG tablet Take 1 tablet by mouth every Monday, Wednesday, and Friday. 90 tablet 3   Blood Glucose Monitoring Suppl (ONE TOUCH ULTRA MINI) w/Device KIT  Use to check blood sugars twice a day. 1 kit 0   carvedilol  (COREG ) 6.25 MG tablet TAKE 1 TABLET BY MOUTH TWICE  DAILY WITH MEALS 200 tablet 3   donepezil  (ARICEPT  ODT) 10 MG disintegrating tablet DISSOLVE 1 TABLET ON THE TONGUE  AT BEDTIME 100 tablet 1   Evolocumab  (REPATHA ) 140 MG/ML SOSY Inject 140 mg into the skin every 14 (fourteen) days. 2.1 mL 26   FARXIGA  10 MG TABS tablet Take 10 mg by mouth daily.     gabapentin  (NEURONTIN ) 100 MG capsule Take 2 capsules (200 mg total) by mouth at bedtime. 180 capsule 0   glucose blood (ONETOUCH ULTRA) test strip USE TO CHECK BLOOD  SUGARS TWICE A DAY AND PRN 200 strip 4   Lancets (ONETOUCH ULTRASOFT) lancets CHECK TWICE DAILY BEFORE  BREAKFAST AND DINNER 200 each 4   losartan  (COZAAR ) 50 MG tablet Take 1 tablet (50 mg total) by mouth daily. 100 tablet 3   LUMIGAN  0.01 % SOLN Place 1 drop into both eyes every evening.     mirtazapine  (REMERON ) 7.5 MG tablet Take 1 tablet (7.5 mg total) by mouth at bedtime. 90 tablet 0   nitroGLYCERIN  (NITROSTAT ) 0.4 MG SL tablet Place 1 tablet (0.4 mg total) under the tongue every 5 (five) minutes as needed for chest pain. 25 tablet 4   Semaglutide  (RYBELSUS ) 14 MG TABS Take 1 tablet (14 mg total) by mouth daily in the afternoon. 90 tablet 1   traMADol  (ULTRAM ) 50 MG tablet Take 1 tablet (50 mg total) by mouth every 12 (twelve) hours as needed for severe pain. 30 tablet 1   No facility-administered medications prior to visit.    PAST MEDICAL HISTORY: Past Medical History:  Diagnosis Date   Anemia    on meds   Arthritis    hips/wrists/back   Cataract    sx-bilateral   Chronic kidney disease    stage 3   Diabetes mellitus without complication (HCC)    on meds   Heart murmur    Hypertension    on meds   Iron deficiency    on oral iron supplements, has not required transfusion   Mixed hyperlipidemia    on meds   OSA on CPAP    compliant   Sleep apnea    uses CPAP   Thyroid  nodule    removed-not on meds  at this time (12/01/2020)    PAST SURGICAL HISTORY: Past Surgical History:  Procedure Laterality Date   ABDOMINAL HYSTERECTOMY  1996   EYE SURGERY     JOINT REPLACEMENT  08/06/21   MASS EXCISION Right 2017   right arm mass removal   THYROID  SURGERY     left side removal benign nodule   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   Procedure: RIGHT TOTAL HIP ARTHROPLASTY ANTERIOR APPROACH;  Surgeon: Jerri Kay HERO, MD;  Location: [facility] OR;  Service: Orthopedics;  Laterality: Right;  3-C    FAMILY HISTORY: Family History  Problem Relation Age of Onset   Stroke Mother    Hypertension Mother    Heart disease Mother    Heart attack Father    Early death Father    Hypertension Father    Diabetes Sister    Early death Sister    Hypertension Sister    Stroke Sister    Diabetes Sister    Early death Sister    Hyperlipidemia Sister    Kidney disease Sister    Diabetes Sister    Hyperlipidemia Sister    Hypertension Sister    Diabetes Sister    Early death Brother    Arthritis Brother    Heart attack Brother    Heart disease Brother    Arthritis Brother    Memory loss Paternal Uncle    Depression Daughter    Hypertension Daughter    Hyperlipidemia Daughter    Prostate cancer Son    Cancer Son    Early death Son    Cancer Niece        gyn cancer   Colon polyps Neg Hx    Colon cancer Neg Hx    Esophageal cancer Neg Hx    Rectal cancer Neg Hx  Stomach cancer Neg Hx    Sleep apnea Neg Hx     SOCIAL HISTORY: Social History   Socioeconomic History   Marital status: Divorced    Spouse name: Not on file   Number of children: 6   Years of education: some college   Highest education level: Some college, no degree  Occupational History   Occupation: Retired   Tobacco Use   Smoking status: Former    Current packs/day: 0.00    Average packs/day: 0.3 packs/day for 1 year (0.3 ttl pk-yrs)    Types: Cigarettes    Start date: 4    Quit date: 1960    Years since  quitting: 65.0   Smokeless tobacco: Never   Tobacco comments:    Smoked briefly in my twenties  Vaping Use   Vaping status: Never Used  Substance and Sexual Activity   Alcohol use: Not Currently   Drug use: Never   Sexual activity: Not Currently    Birth control/protection: Abstinence  Other Topics Concern   Not on file  Social History Narrative   03/16/20 lives with dgtr Catherine Mcconnell   Mother of 6   --Two children have passed, one from from prostate cancer and one from liver failure   Used to be a LAWYER, Programmer, Systems   Recently moved back to [place]   Social Drivers of [employer] Strain: Low Risk  (02/18/2023)   Overall Financial Resource Strain (CARDIA)    Difficulty of Paying Living Expenses: Not hard at all  Food Insecurity: No Food Insecurity (02/18/2023)   Hunger Vital Sign    Worried About Running Out of Food in the Last Year: Never true    Ran Out of Food in the Last Year: Never true  Transportation Needs: No Transportation Needs (02/18/2023)   PRAPARE - Administrator, Civil Service (Medical): No    Lack of Transportation (Non-Medical): No  Physical Activity: Inactive (02/18/2023)   Exercise Vital Sign    Days of Exercise per Week: 0 days    Minutes of Exercise per Session: 0 min  Stress: No Stress Concern Present (02/18/2023)   [employer] of Occupational Health - Occupational Stress Questionnaire    Feeling of Stress : Only a little  Social Connections: Unknown (02/18/2023)   Social Connection and Isolation Panel [NHANES]    Frequency of Communication with Friends and Family: Once a week    Frequency of Social Gatherings with Friends and Family: Patient declined    Attends Religious Services: More than 4 times per year    Active Member of [employer] or Organizations: No    Attends Banker Meetings: Never    Marital Status: Divorced  Catering Manager Violence: Not At Risk (10/21/2022)   Humiliation, Afraid, Rape, and Kick questionnaire    Fear  of Current or Ex-Partner: No    Emotionally Abused: No    Physically Abused: No    Sexually Abused: No      PHYSICAL EXAM  There were no vitals filed for this visit.    There is no height or weight on file to calculate BMI.      02/27/2022   12:02 PM 10/24/2021   11:17 AM 04/12/2021    2:25 PM  MMSE - Mini Mental State Exam  Orientation to time 4 3 5   Orientation to Place 3 5 5   Registration 3 3 3   Attention/ Calculation 5 1 2   Recall 2 2 3   Language- name 2  objects 2 2 2   Language- repeat 1 1 1   Language- follow 3 step command 3 3 3   Language- read & follow direction 1 1 1   Write a sentence 1 1 1   Copy design 0 1 1  Total score 25 23 27         01/02/2021    1:27 PM 07/17/2020    9:19 AM 03/16/2020    9:40 AM  Montreal Cognitive Assessment   Visuospatial/ Executive (0/5) 5 5 4   Naming (0/3) 3 3 3   Attention: Read list of digits (0/2) 0 2 1  Attention: Read list of letters (0/1) 1 1 1   Attention: Serial 7 subtraction starting at 100 (0/3) 1 2 3   Language: Repeat phrase (0/2) 2 2 2   Language : Fluency (0/1) 1 0 1  Abstraction (0/2) 2 2 2   Delayed Recall (0/5) 1 2 1   Orientation (0/6) 6 6 6   Total 22 25 24   Adjusted Score (based on education)  25     Generalized: Well developed, in no acute distress  Chest: Lungs clear to auscultation bilaterally  Neurological examination  Mentation: Alert oriented to time, place, history taking. Follows all commands speech and language fluent Cranial nerve II-XII: Facial symmetry noted DIAGNOSTIC DATA (LABS, IMAGING, TESTING) - I reviewed patient records, labs, notes, testing and imaging myself where available.  Lab Results  Component Value Date   WBC 2.9 (L) 08/19/2023   HGB 11.5 (L) 08/19/2023   HCT 34.5 (L) 08/19/2023   MCV 89.6 08/19/2023   PLT 168.0 08/19/2023      Component Value Date/Time   NA 137 08/19/2023 1507   NA 140 08/21/2022 0000   K 4.3 08/19/2023 1507   CL 102 08/19/2023 1507   CO2 27 08/19/2023  1507   GLUCOSE 107 (H) 08/19/2023 1507   BUN 28 (H) 08/19/2023 1507   BUN 21 08/21/2022 0000   CREATININE 1.99 (H) 08/19/2023 1507   CREATININE 1.44 (H) 06/14/2020 1353   CALCIUM  9.6 08/19/2023 1507   PROT 6.7 08/19/2023 1507   ALBUMIN 4.2 08/19/2023 1507   AST 19 08/19/2023 1507   ALT 17 08/19/2023 1507   ALKPHOS 47 08/19/2023 1507   BILITOT 0.5 08/19/2023 1507   GFRNONAA 42 (L) 08/07/2021 0504   GFRAA 38 10/16/2021 0000   Lab Results  Component Value Date   CHOL 173 08/19/2023   HDL 63.50 08/19/2023   LDLCALC 84 08/19/2023   TRIG 130.0 08/19/2023   CHOLHDL 3 08/19/2023   Lab Results  Component Value Date   HGBA1C 8.2 (H) 08/19/2023   Lab Results  Component Value Date   VITAMINB12 >1537 (H) 08/19/2023   Lab Results  Component Value Date   TSH 3.48 06/14/2020      ASSESSMENT AND PLAN 78 y.o. year old female  has a past medical history of Anemia, Arthritis, Cataract, Chronic kidney disease, Diabetes mellitus without complication (HCC), Heart murmur, Hypertension, Iron deficiency, Mixed hyperlipidemia, OSA on CPAP, Sleep apnea, and Thyroid  nodule. here with:   1.  Sleep apnea on Bipap  Good compliance Residual AHI is elevated will increase pressure 18/13 with respiratory rate of 12 also ordered a mask refitting to be fitted without her dentures in place Encouraged her to continue using CPAP nightly and greater than 4 hours each night  Follow-up in 6-7 months or sooner if needed    Duwaine Russell, MSN, NP-C 10/19/2023, 1:14 AM [facility] [address] [place], [place] [address] [phone]

## 2023-10-17 ENCOUNTER — Encounter: Payer: Self-pay | Admitting: Nurse Practitioner

## 2023-10-17 MED ORDER — RYBELSUS 14 MG PO TABS
1.0000 | ORAL_TABLET | Freq: Every day | ORAL | 1 refills | Status: DC
  Filled 2023-10-28: qty 90, 90d supply, fill #0

## 2023-10-20 ENCOUNTER — Telehealth: Payer: Self-pay | Admitting: Adult Health

## 2023-10-20 ENCOUNTER — Ambulatory Visit: Payer: Medicare HMO | Admitting: Adult Health

## 2023-10-20 ENCOUNTER — Encounter: Payer: Self-pay | Admitting: Nurse Practitioner

## 2023-10-20 NOTE — Progress Notes (Deleted)
 PATIENT: Catherine Mcconnell DOB: [birth year]  REASON FOR VISIT: follow up HISTORY FROM: patient Primary neurologist: Dr. Chalice  No chief complaint on file.    HISTORY OF PRESENT ILLNESS: Today 10/20/23:  Catherine Mcconnell is a 78 y.o. female with a history of OSA on Bipap and memory disturbance. Returns today for follow-up.      03/17/23: Catherine Mcconnell is a 78 y.o. female with a history of OSA on Bipap and Memory disturbance. Returns today for follow-up.  She is here today with her daughter.  She reports that they changed out the mask earlier in May.  Patient still has a leak.  She does not wear her dentures when she is sleeping.  Her download is below       02/27/22: Catherine Mcconnell is a 78 year old female with a history of obstructive sleep apnea on CPAP and memory disturbance.  She returns today for follow-up.  OSA On CPAP: DL is below. Wants to try full facemask. Otherwise no compliants.   Memory: Better, currently on Aricept . Reports that she is sleeping better and memory has been better. Lives with daughter- going to [place]  to spend the summer with family. Able to complete ADLS independently. Manages own medications, appts and finances.     10/24/21: Catherine Mcconnell is a 78 year old female with a history of obstructive sleep apnea on CPAP and memory disturbance.  She returns today for follow-up.  She is with her daughter.  Patient states that she does not sleep all night long.  States that she typically wakes up and stays up for several hours.  If she goes back to sleep during the day she does not use a CPAP.  Patient had hip replacement in October and daughter states that her schedule has been altered since then.  Patient feels that her memory has remained relatively stable.  She lives with her daughter.  Daughter helps her with her medications.  She reports that she manages her own finances.  She continues on Aricept  10 mg at bedtime.      04/12/21: Catherine Mcconnell is a 78 year old female with a  history of obstructive sleep apnea on CPAP and memory disturbance.  She returns today for follow-up.  She reports that the CPAP works well.  She denies any new issues.  She feels that her memory has remained stable.  She is able to complete all ADLs independently.  She lives with her daughter.  She manages her own medications and appointments.  She is able to operate a motor vehicle.  She currently takes Aricept  5 mg at bedtime.    01/02/21: Catherine Mcconnell  is a 78 year old female with a history of obstructive sleep apnea and memory disturbance.  She returns today for follow-up.  She reports that her memory has been relatively stable.  She does not feel that it is gotten any worse.  She lives with her daughter.  Her daughter was unable to attend this visit.  She states that on occasion she feels brain fog but this is inconsistent versus consistent.  She is able to complete all ADLs independently.  She does operate a librarian, academic.  Denies any trouble driving but she reports that she does use a GPS consistently to be safe.  She denies any troubles managing her finances.  Reports that she continues to cook meals without difficulty.  She is currently on no medication for her memory.  She continues to use the CPAP consistently.  HISTORY (Copied from Dr.Dohmeier's note)  New problem for this established sleep patient, last seen by NP- Televisit.  Catherine Mcconnell is a 78 year- old african -american . female is seen on 03-16-2020 with her daughter. According to her daughter Catherine Mcconnell has forgotten to pay her phone bill which has never happened before, she has gotten lost driving in town on familiar roads.  There were no aggravating factors such as bad weather or nighttime driving.  These events have left Catherine Mcconnell a little bit shaken. Based on these concerns her physician assistant Mrs. Job has sent the patient for follow-up.     The memory loss is accompanied by a rather poorly controlled diabetes still the  HbA1c was 8.4 in April of this year, she has a past medical history of chronic kidney disease which is not graded, arthritis, heart iron deficiency, hypertension, mixed hyperlipidemia and she has a history of a thyroid  nodule which was surgically removed. She has a past surgical history of has to hysterectomy in 1996 thyroid  surgery a biopsy revealed a benign nodule.  And she had a skin mass removed from her right upper arm in 2017 which  was not cancerous.    She is followed here in our sleep clinic for OSA obstructive sleep apnea on CPAP.  Just to stay for another visit she has been 100% compliant by days, 97% compliant by time average use at time of 6 hours 51 minutes, CPAP is set to 11 cm water  pressure with 3 cm EPR her residual apnea-hypopnea index is 2.4/h very good she does have some central apneas arising but overall the main problem may be an air leak but not a lack of control of apnea.  95th percentile air leak was 55 L/min which means that the mask gets dislodged sometimes.   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      REVIEW OF SYSTEMS: Out of a complete 14 system review of symptoms, the patient complains only of the following symptoms, and all other reviewed systems are negative.  ESS 16   ALLERGIES: Allergies  Allergen Reactions   Lisinopril Rash   Metformin  And Related Swelling    HOME MEDICATIONS: Outpatient Medications Prior to Visit  Medication Sig Dispense Refill   amLODipine  (NORVASC ) 10 MG tablet Take 1 tablet (10 mg total) by mouth daily. 100 tablet 3   aspirin  EC 81 MG tablet Take 1 tablet (81 mg total) by mouth daily. Swallow whole. 90 tablet 3   atorvastatin  (LIPITOR) 80 MG tablet Take 1 tablet by mouth every Monday, Wednesday, and Friday. 90 tablet 3   Blood Glucose Monitoring Suppl (ONE  TOUCH ULTRA MINI) w/Device KIT Use to check blood sugars twice a day. 1 kit 0   carvedilol  (COREG ) 6.25 MG tablet TAKE 1 TABLET BY MOUTH TWICE  DAILY WITH MEALS 200 tablet 3   donepezil  (ARICEPT  ODT) 10 MG disintegrating tablet DISSOLVE 1 TABLET ON THE TONGUE  AT BEDTIME 100 tablet 1   Evolocumab  (REPATHA ) 140 MG/ML SOSY Inject 140 mg into the skin every 14 (fourteen) days. 2.1 mL 26   FARXIGA  10 MG TABS tablet Take 10 mg by mouth daily.     gabapentin  (NEURONTIN ) 100 MG capsule Take 2 capsules (200 mg total) by mouth at bedtime. 180 capsule 0   glucose blood (ONETOUCH ULTRA) test strip USE  TO CHECK BLOOD SUGARS TWICE A DAY AND PRN 200 strip 4   Lancets (ONETOUCH ULTRASOFT) lancets CHECK TWICE DAILY BEFORE  BREAKFAST AND DINNER 200 each 4   losartan  (COZAAR ) 50 MG tablet Take 1 tablet (50 mg total) by mouth daily. 100 tablet 3   LUMIGAN  0.01 % SOLN Place 1 drop into both eyes every evening.     mirtazapine  (REMERON ) 7.5 MG tablet Take 1 tablet (7.5 mg total) by mouth at bedtime. 90 tablet 0   nitroGLYCERIN  (NITROSTAT ) 0.4 MG SL tablet Place 1 tablet (0.4 mg total) under the tongue every 5 (five) minutes as needed for chest pain. 25 tablet 4   Semaglutide  (RYBELSUS ) 14 MG TABS Take 1 tablet (14 mg total) by mouth daily in the afternoon. 90 tablet 1   traMADol  (ULTRAM ) 50 MG tablet Take 1 tablet (50 mg total) by mouth every 12 (twelve) hours as needed for severe pain. 30 tablet 1   No facility-administered medications prior to visit.    PAST MEDICAL HISTORY: Past Medical History:  Diagnosis Date   Anemia    on meds   Arthritis    hips/wrists/back   Cataract    sx-bilateral   Chronic kidney disease    stage 3   Diabetes mellitus without complication (HCC)    on meds   Heart murmur    Hypertension    on meds   Iron deficiency    on oral iron supplements, has not required transfusion   Mixed hyperlipidemia    on meds   OSA on CPAP    compliant   Sleep apnea    uses CPAP   Thyroid   nodule    removed-not on meds at this time (12/01/2020)    PAST SURGICAL HISTORY: Past Surgical History:  Procedure Laterality Date   ABDOMINAL HYSTERECTOMY  1996   EYE SURGERY     JOINT REPLACEMENT  08/06/21   MASS EXCISION Right 2017   right arm mass removal   THYROID  SURGERY     left side removal benign nodule   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   Procedure: RIGHT TOTAL HIP ARTHROPLASTY ANTERIOR APPROACH;  Surgeon: Jerri Kay HERO, MD;  Location: [facility] OR;  Service: Orthopedics;  Laterality: Right;  3-C    FAMILY HISTORY: Family History  Problem Relation Age of Onset   Stroke Mother    Hypertension Mother    Heart disease Mother    Heart attack Father    Early death Father    Hypertension Father    Diabetes Sister    Early death Sister    Hypertension Sister    Stroke Sister    Diabetes Sister    Early death Sister    Hyperlipidemia Sister    Kidney disease Sister    Diabetes Sister    Hyperlipidemia Sister    Hypertension Sister    Diabetes Sister    Early death Brother    Arthritis Brother    Heart attack Brother    Heart disease Brother    Arthritis Brother    Memory loss Paternal Uncle    Depression Daughter    Hypertension Daughter    Hyperlipidemia Daughter    Prostate cancer Son    Cancer Son    Early death Son    Cancer Niece        gyn cancer   Colon polyps Neg Hx    Colon cancer Neg Hx    Esophageal cancer Neg Hx    Rectal cancer  Neg Hx    Stomach cancer Neg Hx    Sleep apnea Neg Hx     SOCIAL HISTORY: Social History   Socioeconomic History   Marital status: Divorced    Spouse name: Not on file   Number of children: 6   Years of education: some college   Highest education level: Some college, no degree  Occupational History   Occupation: Retired   Tobacco Use   Smoking status: Former    Current packs/day: 0.00    Average packs/day: 0.3 packs/day for 1 year (0.3 ttl pk-yrs)    Types: Cigarettes    Start date: 68    Quit  date: 1960    Years since quitting: 65.0   Smokeless tobacco: Never   Tobacco comments:    Smoked briefly in my twenties  Vaping Use   Vaping status: Never Used  Substance and Sexual Activity   Alcohol use: Not Currently   Drug use: Never   Sexual activity: Not Currently    Birth control/protection: Abstinence  Other Topics Concern   Not on file  Social History Narrative   03/16/20 lives with dgtr Juanna   Mother of 6   --Two children have passed, one from from prostate cancer and one from liver failure   Used to be a LAWYER, Programmer, Systems   Recently moved back to [place]   Social Drivers of [employer] Strain: Low Risk  (02/18/2023)   Overall Financial Resource Strain (CARDIA)    Difficulty of Paying Living Expenses: Not hard at all  Food Insecurity: No Food Insecurity (02/18/2023)   Hunger Vital Sign    Worried About Running Out of Food in the Last Year: Never true    Ran Out of Food in the Last Year: Never true  Transportation Needs: No Transportation Needs (02/18/2023)   PRAPARE - Administrator, Civil Service (Medical): No    Lack of Transportation (Non-Medical): No  Physical Activity: Inactive (02/18/2023)   Exercise Vital Sign    Days of Exercise per Week: 0 days    Minutes of Exercise per Session: 0 min  Stress: No Stress Concern Present (02/18/2023)   [employer] of Occupational Health - Occupational Stress Questionnaire    Feeling of Stress : Only a little  Social Connections: Unknown (02/18/2023)   Social Connection and Isolation Panel [NHANES]    Frequency of Communication with Friends and Family: Once a week    Frequency of Social Gatherings with Friends and Family: Patient declined    Attends Religious Services: More than 4 times per year    Active Member of [employer] or Organizations: No    Attends Banker Meetings: Never    Marital Status: Divorced  Catering Manager Violence: Not At Risk (10/21/2022)   Humiliation, Afraid, Rape, and  Kick questionnaire    Fear of Current or Ex-Partner: No    Emotionally Abused: No    Physically Abused: No    Sexually Abused: No      PHYSICAL EXAM  There were no vitals filed for this visit.    There is no height or weight on file to calculate BMI.      02/27/2022   12:02 PM 10/24/2021   11:17 AM 04/12/2021    2:25 PM  MMSE - Mini Mental State Exam  Orientation to time 4 3 5   Orientation to Place 3 5 5   Registration 3 3 3   Attention/ Calculation 5 1 2   Recall 2 2  3  Language- name 2 objects 2 2 2   Language- repeat 1 1 1   Language- follow 3 step command 3 3 3   Language- read & follow direction 1 1 1   Write a sentence 1 1 1   Copy design 0 1 1  Total score 25 23 27         01/02/2021    1:27 PM 07/17/2020    9:19 AM 03/16/2020    9:40 AM  Montreal Cognitive Assessment   Visuospatial/ Executive (0/5) 5 5 4   Naming (0/3) 3 3 3   Attention: Read list of digits (0/2) 0 2 1  Attention: Read list of letters (0/1) 1 1 1   Attention: Serial 7 subtraction starting at 100 (0/3) 1 2 3   Language: Repeat phrase (0/2) 2 2 2   Language : Fluency (0/1) 1 0 1  Abstraction (0/2) 2 2 2   Delayed Recall (0/5) 1 2 1   Orientation (0/6) 6 6 6   Total 22 25 24   Adjusted Score (based on education)  25     Generalized: Well developed, in no acute distress  Chest: Lungs clear to auscultation bilaterally  Neurological examination  Mentation: Alert oriented to time, place, history taking. Follows all commands speech and language fluent Cranial nerve II-XII: Facial symmetry noted DIAGNOSTIC DATA (LABS, IMAGING, TESTING) - I reviewed patient records, labs, notes, testing and imaging myself where available.  Lab Results  Component Value Date   WBC 2.9 (L) 08/19/2023   HGB 11.5 (L) 08/19/2023   HCT 34.5 (L) 08/19/2023   MCV 89.6 08/19/2023   PLT 168.0 08/19/2023      Component Value Date/Time   NA 137 08/19/2023 1507   NA 140 08/21/2022 0000   K 4.3 08/19/2023 1507   CL 102  08/19/2023 1507   CO2 27 08/19/2023 1507   GLUCOSE 107 (H) 08/19/2023 1507   BUN 28 (H) 08/19/2023 1507   BUN 21 08/21/2022 0000   CREATININE 1.99 (H) 08/19/2023 1507   CREATININE 1.44 (H) 06/14/2020 1353   CALCIUM  9.6 08/19/2023 1507   PROT 6.7 08/19/2023 1507   ALBUMIN 4.2 08/19/2023 1507   AST 19 08/19/2023 1507   ALT 17 08/19/2023 1507   ALKPHOS 47 08/19/2023 1507   BILITOT 0.5 08/19/2023 1507   GFRNONAA 42 (L) 08/07/2021 0504   GFRAA 38 10/16/2021 0000   Lab Results  Component Value Date   CHOL 173 08/19/2023   HDL 63.50 08/19/2023   LDLCALC 84 08/19/2023   TRIG 130.0 08/19/2023   CHOLHDL 3 08/19/2023   Lab Results  Component Value Date   HGBA1C 8.2 (H) 08/19/2023   Lab Results  Component Value Date   VITAMINB12 >1537 (H) 08/19/2023   Lab Results  Component Value Date   TSH 3.48 06/14/2020      ASSESSMENT AND PLAN 78 y.o. year old female  has a past medical history of Anemia, Arthritis, Cataract, Chronic kidney disease, Diabetes mellitus without complication (HCC), Heart murmur, Hypertension, Iron deficiency, Mixed hyperlipidemia, OSA on CPAP, Sleep apnea, and Thyroid  nodule. here with:   1.  Sleep apnea on Bipap  Good compliance Residual AHI is elevated will increase pressure 18/13 with respiratory rate of 12 also ordered a mask refitting to be fitted without her dentures in place Encouraged her to continue using CPAP nightly and greater than 4 hours each night  Follow-up in 6-7 months or sooner if needed    Duwaine Russell, MSN, NP-C 10/20/2023, 5:03 PM [facility] [address] [place],  [place] [address] [phone]

## 2023-10-20 NOTE — Telephone Encounter (Signed)
 Pt's daughter r/s appt due to weather. Rescheduled and wait listed

## 2023-10-21 ENCOUNTER — Other Ambulatory Visit (HOSPITAL_COMMUNITY): Payer: Self-pay

## 2023-10-21 ENCOUNTER — Encounter: Payer: Self-pay | Admitting: Nurse Practitioner

## 2023-10-21 ENCOUNTER — Ambulatory Visit: Payer: No Typology Code available for payment source | Admitting: Adult Health

## 2023-10-21 ENCOUNTER — Telehealth: Payer: Self-pay | Admitting: Physician Assistant

## 2023-10-21 ENCOUNTER — Telehealth: Payer: Self-pay

## 2023-10-21 NOTE — Telephone Encounter (Signed)
 Pharmacy Patient Advocate Encounter   Received notification from Pt Calls Messages that prior authorization for Rybelsus  14mg  is required/requested.   Insurance verification completed.   The patient is insured through [employer] .   Per test claim: PA required; PA submitted to above mentioned insurance via CoverMyMeds Key/confirmation #/EOC [id number] Status is pending

## 2023-10-21 NOTE — Telephone Encounter (Signed)
 Patient's daughter came into the office in regards to patient's rybelsus  14 mg tabs. States pharmacy informed her she could get three tabs to hold over the patient but upon her going to the pharmacy the next day, they informed her they couldn't fill the med. States they informed her that since patient has new insurance, a PA is needed. Patient has only one tablet left. Is there anyway we can get pt some samples?

## 2023-10-21 NOTE — Telephone Encounter (Signed)
 Please do PA for Rybelsus 14 mg tablet daily. Pt is diabetic.

## 2023-10-22 ENCOUNTER — Encounter: Payer: Self-pay | Admitting: Physician Assistant

## 2023-10-22 ENCOUNTER — Encounter: Payer: Self-pay | Admitting: Adult Health

## 2023-10-22 ENCOUNTER — Other Ambulatory Visit: Payer: Self-pay

## 2023-10-22 ENCOUNTER — Encounter (HOSPITAL_BASED_OUTPATIENT_CLINIC_OR_DEPARTMENT_OTHER): Payer: Self-pay

## 2023-10-22 ENCOUNTER — Other Ambulatory Visit (HOSPITAL_BASED_OUTPATIENT_CLINIC_OR_DEPARTMENT_OTHER): Payer: Self-pay

## 2023-10-22 DIAGNOSIS — I10 Essential (primary) hypertension: Secondary | ICD-10-CM

## 2023-10-22 DIAGNOSIS — E782 Mixed hyperlipidemia: Secondary | ICD-10-CM

## 2023-10-22 DIAGNOSIS — I2510 Atherosclerotic heart disease of native coronary artery without angina pectoris: Secondary | ICD-10-CM

## 2023-10-22 MED ORDER — CARVEDILOL 6.25 MG PO TABS
6.2500 mg | ORAL_TABLET | Freq: Two times a day (BID) | ORAL | 1 refills | Status: DC
  Filled 2023-10-22: qty 180, 90d supply, fill #0
  Filled 2023-12-03 – 2024-08-16 (×2): qty 180, 90d supply, fill #1
  Filled ????-??-??: fill #1

## 2023-10-22 MED ORDER — NITROGLYCERIN 0.4 MG SL SUBL
0.4000 mg | SUBLINGUAL_TABLET | SUBLINGUAL | 2 refills | Status: AC | PRN
  Filled 2023-10-22: qty 25, 1d supply, fill #0
  Filled 2024-09-16: qty 25, 8d supply, fill #0

## 2023-10-22 MED ORDER — DONEPEZIL HCL 10 MG PO TBDP
ORAL_TABLET | ORAL | 0 refills | Status: DC
  Filled 2023-10-22: qty 90, 90d supply, fill #0
  Filled 2024-03-09: qty 90, 90d supply, fill #1
  Filled ????-??-??: fill #1

## 2023-10-22 MED ORDER — AMLODIPINE BESYLATE 10 MG PO TABS
10.0000 mg | ORAL_TABLET | Freq: Every day | ORAL | 1 refills | Status: DC
  Filled 2023-10-22: qty 90, 90d supply, fill #0
  Filled 2023-12-03 – 2024-08-16 (×2): qty 90, 90d supply, fill #1
  Filled ????-??-??: fill #1

## 2023-10-22 MED ORDER — REPATHA SURECLICK 140 MG/ML ~~LOC~~ SOAJ
140.0000 mg | SUBCUTANEOUS | 5 refills | Status: AC
  Filled 2023-10-22: qty 2, 28d supply, fill #0
  Filled 2024-05-03: qty 2, 28d supply, fill #1
  Filled 2024-06-03: qty 2, 28d supply, fill #2
  Filled 2024-08-16: qty 2, 28d supply, fill #3
  Filled 2024-09-16: qty 2, 28d supply, fill #4
  Filled 2024-10-18: qty 2, 28d supply, fill #5

## 2023-10-22 MED ORDER — GABAPENTIN 100 MG PO CAPS
200.0000 mg | ORAL_CAPSULE | Freq: Every day | ORAL | 0 refills | Status: DC
  Filled 2023-10-22: qty 180, 90d supply, fill #0

## 2023-10-22 MED ORDER — LOSARTAN POTASSIUM 50 MG PO TABS
50.0000 mg | ORAL_TABLET | Freq: Every day | ORAL | 1 refills | Status: DC
  Filled 2023-10-22: qty 90, 90d supply, fill #0
  Filled 2023-12-03 – 2024-08-16 (×2): qty 90, 90d supply, fill #1
  Filled ????-??-??: fill #1

## 2023-10-22 MED ORDER — ATORVASTATIN CALCIUM 80 MG PO TABS
ORAL_TABLET | ORAL | 1 refills | Status: DC
  Filled 2023-10-22: qty 30, 70d supply, fill #0
  Filled 2023-12-03 – 2024-05-16 (×2): qty 30, 70d supply, fill #1
  Filled ????-??-??: fill #1

## 2023-10-22 NOTE — Telephone Encounter (Signed)
 Pharmacy Patient Advocate Encounter  Received notification from [facility] that Prior Authorization for Rybelsus 14mg  has been APPROVED from 10/22/2023 to 10/13/2024

## 2023-10-23 ENCOUNTER — Other Ambulatory Visit (HOSPITAL_BASED_OUTPATIENT_CLINIC_OR_DEPARTMENT_OTHER): Payer: Self-pay

## 2023-10-23 ENCOUNTER — Other Ambulatory Visit: Payer: Self-pay

## 2023-10-23 MED ORDER — ONETOUCH ULTRASOFT LANCETS MISC
4 refills | Status: DC
  Filled 2023-10-23: qty 200, 90d supply, fill #0

## 2023-10-23 MED ORDER — ONETOUCH ULTRA VI STRP
ORAL_STRIP | 4 refills | Status: DC
  Filled 2023-10-23 – 2023-11-11 (×2): qty 200, 90d supply, fill #0

## 2023-10-26 NOTE — Progress Notes (Addendum)
 PATIENT: Catherine Mcconnell DOB: May 23, 1946  REASON FOR VISIT: follow up HISTORY FROM: patient   Virtual Visit via Video Note  I connected with Catherine Mcconnell on 10/28/23 at  1:15 PM EST by a video enabled telemedicine application located remotely at [facility] and verified that I am speaking with the correct person using two identifiers who was located at their own home.   I discussed the limitations of evaluation and management by telemedicine and the availability of in person appointments. The patient expressed understanding and agreed to proceed.   PATIENT: Catherine Mcconnell DOB: 04-Nov-1945  REASON FOR VISIT: follow up HISTORY FROM: patient  HISTORY OF PRESENT ILLNESS: Today 10/28/23  Catherine Mcconnell is a 78 y.o. female with a history of obstructive sleep apnea on BiPAP. Returns today for follow-up.  She reports that the BiPAP is working well.  At the last visit we increased her pressure.  Her apnea has declined from 16 events an hour to 8 events an hour.  She is tolerating the new pressure well.  Her download is below     HISTORY   REVIEW OF SYSTEMS: Out of a complete 14 system review of symptoms, the patient complains only of the following symptoms, and all other reviewed systems are negative.  ALLERGIES: Allergies  Allergen Reactions   Lisinopril Rash   Metformin  And Related Swelling    HOME MEDICATIONS: Outpatient Medications Prior to Visit  Medication Sig Dispense Refill   amLODipine  (NORVASC ) 10 MG tablet Take 1 tablet (10 mg total) by mouth daily. 90 tablet 1   aspirin  EC 81 MG tablet Take 1 tablet (81 mg total) by mouth daily. Swallow whole. 90 tablet 3   atorvastatin  (LIPITOR) 80 MG tablet Take 1 tablet by mouth every Monday, Wednesday, and Friday. 30 tablet 1   Blood Glucose Monitoring Suppl (ONE TOUCH ULTRA MINI) w/Device KIT Use to check blood sugars twice a day. 1 kit 0   carvedilol  (COREG ) 6.25 MG tablet Take 1 tablet (6.25 mg total) by  mouth 2 (two) times daily with a meal. 180 tablet 1   donepezil  (ARICEPT  ODT) 10 MG disintegrating tablet DISSOLVE 1 TABLET ON THE TONGUE  AT BEDTIME 100 tablet 0   Evolocumab  (REPATHA  SURECLICK) 140 MG/ML SOAJ Inject 140 mg into the skin every 14 (fourteen) days. 2 mL 5   FARXIGA  10 MG TABS tablet Take 10 mg by mouth daily.     gabapentin  (NEURONTIN ) 100 MG capsule Take 2 capsules (200 mg total) by mouth at bedtime. 180 capsule 0   glucose blood (ONETOUCH ULTRA) test strip USE TO CHECK BLOOD SUGARS TWICE A DAY AND PRN 200 strip 4   Lancets (ONETOUCH ULTRASOFT) lancets CHECK TWICE DAILY BEFORE  BREAKFAST AND DINNER 200 each 4   losartan  (COZAAR ) 50 MG tablet Take 1 tablet (50 mg total) by mouth daily. 90 tablet 1   LUMIGAN  0.01 % SOLN Place 1 drop into both eyes every evening.     mirtazapine  (REMERON ) 7.5 MG tablet Take 1 tablet (7.5 mg total) by mouth at bedtime. 90 tablet 0   nitroGLYCERIN  (NITROSTAT ) 0.4 MG SL tablet Place 1 tablet (0.4 mg total) under the tongue every 5 (five) minutes as needed for chest pain. 25 tablet 2   Semaglutide  (RYBELSUS ) 14 MG TABS Take 1 tablet (14 mg total) by mouth daily in the afternoon. 90 tablet 1   traMADol  (ULTRAM ) 50 MG tablet Take 1 tablet (50 mg total) by mouth  every 12 (twelve) hours as needed for severe pain. 30 tablet 1   No facility-administered medications prior to visit.    PAST MEDICAL HISTORY: Past Medical History:  Diagnosis Date   Anemia    on meds   Arthritis    hips/wrists/back   Cataract    sx-bilateral   Chronic kidney disease    stage 3   Diabetes mellitus without complication (HCC)    on meds   Heart murmur    Hypertension    on meds   Iron deficiency    on oral iron supplements, has not required transfusion   Mixed hyperlipidemia    on meds   OSA on CPAP    compliant   Sleep apnea    uses CPAP   Thyroid  nodule    removed-not on meds at this time (12/01/2020)    PAST SURGICAL HISTORY: Past Surgical History:   Procedure Laterality Date   ABDOMINAL HYSTERECTOMY  1996   EYE SURGERY     JOINT REPLACEMENT  08/06/21   MASS EXCISION Right 2017   right arm mass removal   THYROID  SURGERY     left side removal benign nodule   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   Procedure: RIGHT TOTAL HIP ARTHROPLASTY ANTERIOR APPROACH;  Surgeon: Jerri Kay HERO, MD;  Location: [facility] OR;  Service: Orthopedics;  Laterality: Right;  3-C    FAMILY HISTORY: Family History  Problem Relation Age of Onset   Stroke Mother    Hypertension Mother    Heart disease Mother    Heart attack Father    Early death Father    Hypertension Father    Diabetes Sister    Early death Sister    Hypertension Sister    Stroke Sister    Diabetes Sister    Early death Sister    Hyperlipidemia Sister    Kidney disease Sister    Diabetes Sister    Hyperlipidemia Sister    Hypertension Sister    Diabetes Sister    Early death Brother    Arthritis Brother    Heart attack Brother    Heart disease Brother    Arthritis Brother    Memory loss Paternal Uncle    Depression Daughter    Hypertension Daughter    Hyperlipidemia Daughter    Prostate cancer Son    Cancer Son    Early death Son    Cancer Niece        gyn cancer   Colon polyps Neg Hx    Colon cancer Neg Hx    Esophageal cancer Neg Hx    Rectal cancer Neg Hx    Stomach cancer Neg Hx    Sleep apnea Neg Hx     SOCIAL HISTORY: Social History   Socioeconomic History   Marital status: Divorced    Spouse name: Not on file   Number of children: 6   Years of education: some college   Highest education level: Some college, no degree  Occupational History   Occupation: Retired   Tobacco Use   Smoking status: Former    Current packs/day: 0.00    Average packs/day: 0.3 packs/day for 1 year (0.3 ttl pk-yrs)    Types: Cigarettes    Start date: 61    Quit date: 1960    Years since quitting: 65.0   Smokeless tobacco: Never   Tobacco comments:    Smoked briefly in  my twenties  Vaping Use   Vaping status: Never Used  Substance and Sexual Activity   Alcohol use: Not Currently   Drug use: Never   Sexual activity: Not Currently    Birth control/protection: Abstinence  Other Topics Concern   Not on file  Social History Narrative   03/16/20 lives with dgtr Juanna   Mother of 6   --Two children have passed, one from from prostate cancer and one from liver failure   Used to be a CNA, Programmer, Systems   Recently moved back to [place]   Social Drivers of [employer] Strain: Low Risk  (02/18/2023)   Overall Financial Resource Strain (CARDIA)    Difficulty of Paying Living Expenses: Not hard at all  Food Insecurity: No Food Insecurity (02/18/2023)   Hunger Vital Sign    Worried About Running Out of Food in the Last Year: Never true    Ran Out of Food in the Last Year: Never true  Transportation Needs: No Transportation Needs (02/18/2023)   PRAPARE - Administrator, Civil Service (Medical): No    Lack of Transportation (Non-Medical): No  Physical Activity: Unknown (02/18/2023)   Exercise Vital Sign    Days of Exercise per Week: 0 days    Minutes of Exercise per Session: Not on file  Recent Concern: Physical Activity - Inactive (02/18/2023)   Exercise Vital Sign    Days of Exercise per Week: 0 days    Minutes of Exercise per Session: 0 min  Stress: No Stress Concern Present (02/18/2023)   [employer] of Occupational Health - Occupational Stress Questionnaire    Feeling of Stress : Only a little  Social Connections: Unknown (02/18/2023)   Social Connection and Isolation Panel [NHANES]    Frequency of Communication with Friends and Family: Once a week    Frequency of Social Gatherings with Friends and Family: Patient declined    Attends Religious Services: More than 4 times per year    Active Member of [employer] or Organizations: No    Attends Banker Meetings: Not on file    Marital Status: Divorced  Intimate Partner Violence:  Not At Risk (10/21/2022)   Humiliation, Afraid, Rape, and Kick questionnaire    Fear of Current or Ex-Partner: No    Emotionally Abused: No    Physically Abused: No    Sexually Abused: No      PHYSICAL EXAM Generalized: Well developed, in no acute distress   Neurological examination  Mentation: Alert oriented to time, place, history taking. Follows all commands speech and language fluent Cranial nerve II-XII:Extraocular movements were full. Facial symmetry noted.  DIAGNOSTIC DATA (LABS, IMAGING, TESTING) - I reviewed patient records, labs, notes, testing and imaging myself where available.  Lab Results  Component Value Date   WBC 2.9 (L) 08/19/2023   HGB 11.5 (L) 08/19/2023   HCT 34.5 (L) 08/19/2023   MCV 89.6 08/19/2023   PLT 168.0 08/19/2023      Component Value Date/Time   NA 137 08/19/2023 1507   NA 140 08/21/2022 0000   K 4.3 08/19/2023 1507   CL 102 08/19/2023 1507   CO2 27 08/19/2023 1507   GLUCOSE 107 (H) 08/19/2023 1507   BUN 28 (H) 08/19/2023 1507   BUN 21 08/21/2022 0000   CREATININE 1.99 (H) 08/19/2023 1507   CREATININE 1.44 (H) 06/14/2020 1353   CALCIUM  9.6 08/19/2023 1507   PROT 6.7 08/19/2023 1507   ALBUMIN 4.2 08/19/2023 1507   AST 19 08/19/2023 1507   ALT 17 08/19/2023 1507  ALKPHOS 47 08/19/2023 1507   BILITOT 0.5 08/19/2023 1507   GFRNONAA 42 (L) 08/07/2021 0504   GFRAA 38 10/16/2021 0000   Lab Results  Component Value Date   CHOL 173 08/19/2023   HDL 63.50 08/19/2023   LDLCALC 84 08/19/2023   TRIG 130.0 08/19/2023   CHOLHDL 3 08/19/2023   Lab Results  Component Value Date   HGBA1C 8.2 (H) 08/19/2023   Lab Results  Component Value Date   VITAMINB12 >1537 (H) 08/19/2023   Lab Results  Component Value Date   TSH 3.48 06/14/2020      ASSESSMENT AND PLAN 78 y.o. year old female  has a past medical history of Anemia, Arthritis, Cataract, Chronic kidney disease, Diabetes mellitus without complication (HCC), Heart murmur,  Hypertension, Iron deficiency, Mixed hyperlipidemia, OSA on CPAP, Sleep apnea, and Thyroid  nodule. here with:  OSA on BIPAP  BIPAP compliance excellent Residual AHI has improved.  Will keep settings the same for now Has a leak with the mask but not bothering her. Could be contributing to a higher AHI (artifact?) Encouraged patient to continue using BiPAP nightly and > 4 hours each night F/U in 1 year or sooner if needed    Duwaine Russell, MSN, NP-C 10/31/2023, 10:35 AM [facility] [address] [place], [place] [address] [phone]

## 2023-10-27 ENCOUNTER — Ambulatory Visit (INDEPENDENT_AMBULATORY_CARE_PROVIDER_SITE_OTHER): Payer: Medicare HMO

## 2023-10-27 ENCOUNTER — Other Ambulatory Visit (HOSPITAL_BASED_OUTPATIENT_CLINIC_OR_DEPARTMENT_OTHER): Payer: Self-pay

## 2023-10-27 ENCOUNTER — Encounter: Payer: Self-pay | Admitting: *Deleted

## 2023-10-27 VITALS — BP 120/62 | HR 66 | Temp 97.2°F | Wt 142.2 lb

## 2023-10-27 DIAGNOSIS — Z0000 Encounter for general adult medical examination without abnormal findings: Secondary | ICD-10-CM

## 2023-10-27 MED ORDER — REPATHA 140 MG/ML ~~LOC~~ SOSY: 140.0000 mg | PREFILLED_SYRINGE | SUBCUTANEOUS | 26 refills | Status: DC

## 2023-10-27 MED ORDER — CARVEDILOL 6.25 MG PO TABS: 6.2500 mg | ORAL_TABLET | Freq: Two times a day (BID) | ORAL | 3 refills | Status: DC

## 2023-10-27 MED ORDER — ATORVASTATIN CALCIUM 80 MG PO TABS: 80.0000 mg | ORAL_TABLET | ORAL | 3 refills | Status: DC

## 2023-10-27 MED ORDER — DAPAGLIFLOZIN PROPANEDIOL 10 MG PO TABS
10.0000 mg | ORAL_TABLET | Freq: Every day | ORAL | 11 refills | Status: DC
  Filled 2023-10-27: qty 30, 30d supply, fill #0
  Filled 2023-11-27: qty 30, 30d supply, fill #1
  Filled 2023-12-03 – 2023-12-29 (×3): qty 30, 30d supply, fill #2
  Filled 2024-01-22: qty 30, 30d supply, fill #3
  Filled 2024-03-09: qty 30, 30d supply, fill #4
  Filled 2024-05-03: qty 30, 30d supply, fill #5
  Filled 2024-06-03: qty 30, 30d supply, fill #6
  Filled 2024-07-03: qty 30, 30d supply, fill #7
  Filled 2024-08-06: qty 30, 30d supply, fill #8
  Filled ????-??-??: fill #2

## 2023-10-27 MED ORDER — NITROGLYCERIN 0.4 MG SL SUBL: 0.4000 mg | SUBLINGUAL_TABLET | SUBLINGUAL | 4 refills | Status: DC | PRN

## 2023-10-27 MED ORDER — ONETOUCH ULTRA BLUE TEST VI STRP: ORAL_STRIP | 4 refills | Status: DC

## 2023-10-27 MED ORDER — AMLODIPINE BESYLATE 10 MG PO TABS
10.0000 mg | ORAL_TABLET | Freq: Every day | ORAL | 3 refills | Status: DC
  Filled 2023-11-18: qty 100, 100d supply, fill #0

## 2023-10-27 MED ORDER — BIMATOPROST 0.01 % OP SOLN: 1.0000 [drp] | Freq: Every day | OPHTHALMIC | 3 refills | Status: DC

## 2023-10-27 MED ORDER — LOSARTAN POTASSIUM 50 MG PO TABS
50.0000 mg | ORAL_TABLET | Freq: Every day | ORAL | 3 refills | Status: DC
  Filled 2023-10-27: qty 100, 100d supply, fill #0

## 2023-10-27 MED ORDER — ONETOUCH DELICA PLUS LANCET30G MISC: 4 refills | Status: DC

## 2023-10-27 MED ORDER — ONETOUCH ULTRASOFT 2 LANCETS MISC: 1.0000 | Freq: Two times a day (BID) | 4 refills | Status: DC

## 2023-10-27 MED ORDER — RYBELSUS 7 MG PO TABS: 7.0000 mg | ORAL_TABLET | Freq: Every morning | ORAL | 1 refills | Status: DC

## 2023-10-27 NOTE — Progress Notes (Signed)
ESS=[id number]

## 2023-10-27 NOTE — Progress Notes (Signed)
 Subjective:   Catherine Mcconnell is a 78 y.o. female who presents for Medicare Annual (Subsequent) preventive examination.  Visit Complete: In person  Patient Medicare AWV questionnaire was completed by the patient on 10/27/23; I have confirmed that all information answered by patient is correct and no changes since this date.  Cardiac Risk Factors include: advanced age (>70men, >40 women);diabetes mellitus;dyslipidemia;hypertension     Objective:    Today's Vitals   10/27/23 1008  BP: 120/62  Pulse: 66  Temp: (!) 97.2 F (36.2 C)  SpO2: 97%  Weight: 142 lb 3.2 oz (64.5 kg)   Body mass index is 22.95 kg/m.     10/27/2023   10:19 AM 10/21/2022   10:27 AM 02/16/2022   11:26 AM 10/01/2021    9:49 AM 08/01/2021    9:15 AM 05/08/2021    6:15 PM 04/23/2021   12:59 PM  Advanced Directives  Does Patient Have a Medical Advance Directive? Yes Yes Yes Yes No Yes Yes  Type of Estate Agent of [place];Living will Healthcare Power of [place];Living will  Healthcare Power of [employer] of [place];Living will Healthcare Power of [place];Living will  Does patient want to make changes to medical advance directive? No - Patient declined      No - Patient declined  Copy of Healthcare Power of Attorney in Chart? Yes - validated most recent copy scanned in chart (See row information) No - copy requested  No - copy requested     Would patient like information on creating a medical advance directive?     No - Patient declined      Current Medications (verified) Outpatient Encounter Medications as of 10/27/2023  Medication Sig   amLODipine  (NORVASC ) 10 MG tablet Take 1 tablet (10 mg total) by mouth daily.   aspirin  EC 81 MG tablet Take 1 tablet (81 mg total) by mouth daily. Swallow whole.   atorvastatin  (LIPITOR) 80 MG tablet Take 1 tablet by mouth every Monday, Wednesday, and Friday.   Blood Glucose Monitoring Suppl (ONE TOUCH ULTRA MINI) w/Device KIT Use to  check blood sugars twice a day.   carvedilol  (COREG ) 6.25 MG tablet Take 1 tablet (6.25 mg total) by mouth 2 (two) times daily with a meal.   donepezil  (ARICEPT  ODT) 10 MG disintegrating tablet DISSOLVE 1 TABLET ON THE TONGUE  AT BEDTIME   Evolocumab  (REPATHA  SURECLICK) 140 MG/ML SOAJ Inject 140 mg into the skin every 14 (fourteen) days.   FARXIGA  10 MG TABS tablet Take 10 mg by mouth daily.   gabapentin  (NEURONTIN ) 100 MG capsule Take 2 capsules (200 mg total) by mouth at bedtime.   glucose blood (ONETOUCH ULTRA) test strip USE TO CHECK BLOOD SUGARS TWICE A DAY AND PRN   Lancets (ONETOUCH ULTRASOFT) lancets CHECK TWICE DAILY BEFORE  BREAKFAST AND DINNER   losartan  (COZAAR ) 50 MG tablet Take 1 tablet (50 mg total) by mouth daily.   LUMIGAN  0.01 % SOLN Place 1 drop into both eyes every evening.   mirtazapine  (REMERON ) 7.5 MG tablet Take 1 tablet (7.5 mg total) by mouth at bedtime.   nitroGLYCERIN  (NITROSTAT ) 0.4 MG SL tablet Place 1 tablet (0.4 mg total) under the tongue every 5 (five) minutes as needed for chest pain.   Semaglutide  (RYBELSUS ) 14 MG TABS Take 1 tablet (14 mg total) by mouth daily in the afternoon.   traMADol  (ULTRAM ) 50 MG tablet Take 1 tablet (50 mg total) by mouth every 12 (twelve) hours as needed  for severe pain.   No facility-administered encounter medications on file as of 10/27/2023.    Allergies (verified) Lisinopril and Metformin  and related   History: Past Medical History:  Diagnosis Date   Anemia    on meds   Arthritis    hips/wrists/back   Cataract    sx-bilateral   Chronic kidney disease    stage 3   Diabetes mellitus without complication (HCC)    on meds   Heart murmur    Hypertension    on meds   Iron deficiency    on oral iron supplements, has not required transfusion   Mixed hyperlipidemia    on meds   OSA on CPAP    compliant   Sleep apnea    uses CPAP   Thyroid  nodule    removed-not on meds at this time (12/01/2020)   Past Surgical  History:  Procedure Laterality Date   ABDOMINAL HYSTERECTOMY  1996   EYE SURGERY     JOINT REPLACEMENT  08/06/21   MASS EXCISION Right 2017   right arm mass removal   THYROID  SURGERY     left side removal benign nodule   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   Procedure: RIGHT TOTAL HIP ARTHROPLASTY ANTERIOR APPROACH;  Surgeon: Jerri Kay HERO, MD;  Location: [facility] OR;  Service: Orthopedics;  Laterality: Right;  3-C   Family History  Problem Relation Age of Onset   Stroke Mother    Hypertension Mother    Heart disease Mother    Heart attack Father    Early death Father    Hypertension Father    Diabetes Sister    Early death Sister    Hypertension Sister    Stroke Sister    Diabetes Sister    Early death Sister    Hyperlipidemia Sister    Kidney disease Sister    Diabetes Sister    Hyperlipidemia Sister    Hypertension Sister    Diabetes Sister    Early death Brother    Arthritis Brother    Heart attack Brother    Heart disease Brother    Arthritis Brother    Memory loss Paternal Uncle    Depression Daughter    Hypertension Daughter    Hyperlipidemia Daughter    Prostate cancer Son    Cancer Son    Early death Son    Cancer Niece        gyn cancer   Colon polyps Neg Hx    Colon cancer Neg Hx    Esophageal cancer Neg Hx    Rectal cancer Neg Hx    Stomach cancer Neg Hx    Sleep apnea Neg Hx    Social History   Socioeconomic History   Marital status: Divorced    Spouse name: Not on file   Number of children: 6   Years of education: some college   Highest education level: Some college, no degree  Occupational History   Occupation: Retired   Tobacco Use   Smoking status: Former    Current packs/day: 0.00    Average packs/day: 0.3 packs/day for 1 year (0.3 ttl pk-yrs)    Types: Cigarettes    Start date: 57    Quit date: 1960    Years since quitting: 65.0   Smokeless tobacco: Never   Tobacco comments:    Smoked briefly in my twenties  Vaping Use    Vaping status: Never Used  Substance and Sexual Activity   Alcohol use: Not  Currently   Drug use: Never   Sexual activity: Not Currently    Birth control/protection: Abstinence  Other Topics Concern   Not on file  Social History Narrative   03/16/20 lives with dgtr Juanna   Mother of 6   --Two children have passed, one from from prostate cancer and one from liver failure   Used to be a CNA, Programmer, Systems   Recently moved back to [place]   Social Drivers of [employer] Strain: Low Risk  (10/27/2023)   Overall Financial Resource Strain (CARDIA)    Difficulty of Paying Living Expenses: Not hard at all  Food Insecurity: No Food Insecurity (10/27/2023)   Hunger Vital Sign    Worried About Running Out of Food in the Last Year: Never true    Ran Out of Food in the Last Year: Never true  Transportation Needs: No Transportation Needs (10/27/2023)   PRAPARE - Administrator, Civil Service (Medical): No    Lack of Transportation (Non-Medical): No  Physical Activity: Insufficiently Active (10/27/2023)   Exercise Vital Sign    Days of Exercise per Week: 5 days    Minutes of Exercise per Session: 20 min  Stress: No Stress Concern Present (10/27/2023)   [employer] of Occupational Health - Occupational Stress Questionnaire    Feeling of Stress : Not at all  Social Connections: Socially Isolated (10/27/2023)   Social Connection and Isolation Panel [NHANES]    Frequency of Communication with Friends and Family: Once a week    Frequency of Social Gatherings with Friends and Family: Once a week    Attends Religious Services: 1 to 4 times per year    Active Member of [employer] or Organizations: No    Attends Engineer, Structural: Never    Marital Status: Divorced    Tobacco Counseling Counseling given: Not Answered Tobacco comments: Smoked briefly in my twenties   Clinical Intake:  Pre-visit preparation completed: Yes  Pain : No/denies pain     BMI -  recorded: 22.95 Nutritional Status: BMI of 19-24  Normal Nutritional Risks: None Diabetes: Yes CBG done?: Yes (per pt 98) CBG resulted in Enter/ Edit results?: No Did pt. bring in CBG monitor from home?: No  How often do you need to have someone help you when you read instructions, pamphlets, or other written materials from your doctor or pharmacy?: 1 - Never  Interpreter Needed?: No  Information entered by :: Ellouise Haws, LPN   Activities of Daily Living    10/27/2023   10:07 AM  In your present state of health, do you have any difficulty performing the following activities:  Hearing? 0  Vision? 0  Difficulty concentrating or making decisions? 1  Walking or climbing stairs? 0  Dressing or bathing? 0  Doing errands, shopping? 1  Preparing Food and eating ? N  Using the Toilet? N  In the past six months, have you accidently leaked urine? Y  Do you have problems with loss of bowel control? N  Managing your Medications? Y  Managing your Finances? Y  Housekeeping or managing your Housekeeping? N    Patient Care Team: Job Lukes, [place] as PCP - General (Physician Assistant) Lonni Slain, MD as PCP - Cardiology (Cardiology) Elmira Newman PARAS, MD as Consulting Physician (Cardiology) Gearline Norris, MD as Consulting Physician (Nephrology) Georgina Speaks, FNP (General Practice) Dohmeier, Dedra, MD as Consulting Physician (Neurology) [facility] as Consulting Physician  Indicate any recent Medical Services  you may have received from other than [facility] providers in the past year (date may be approximate).     Assessment:   This is a routine wellness examination for [place].  Hearing/Vision screen Hearing Screening - Comments:: Pt denies any hearing issues  Vision Screening - Comments:: Pt follows up with Dr Octavia for annual eye exams    Goals Addressed             This Visit's Progress    Patient Stated       Stay healthy and  exercise        Depression Screen    10/27/2023   10:15 AM 08/19/2023    2:25 PM 06/23/2023   10:04 AM 11/18/2022   10:13 AM 10/21/2022   10:26 AM 10/01/2021    9:47 AM 05/10/2021    8:57 AM  PHQ 2/9 Scores  PHQ - 2 Score 0 0 0 1 0 0 0  PHQ- 9 Score  3  6       Fall Risk    10/27/2023   10:07 AM 06/23/2023   10:04 AM 10/21/2022   10:29 AM 10/01/2021    9:49 AM 05/10/2021    8:57 AM  Fall Risk   Falls in the past year? 0 0 0 0 0  Number falls in past yr:  0 0 0 0  Injury with Fall?  0 0 0 0  Risk for fall due to : Impaired balance/gait No Fall Risks Impaired vision Impaired vision Impaired mobility  Risk for fall due to: Comment   readers    Follow up Falls prevention discussed Falls evaluation completed Falls prevention discussed Falls prevention discussed     MEDICARE RISK AT HOME: Medicare Risk at Home Any stairs in or around the home?: (Patient-Rptd) Yes If so, are there any without handrails?: (Patient-Rptd) Yes Home free of loose throw rugs in walkways, pet beds, electrical cords, etc?: (Patient-Rptd) Yes Adequate lighting in your home to reduce risk of falls?: (Patient-Rptd) Yes Life alert?: (Patient-Rptd) Yes Use of a cane, walker or w/c?: (Patient-Rptd) No Grab bars in the bathroom?: (Patient-Rptd) No Shower chair or bench in shower?: (Patient-Rptd) Yes Elevated toilet seat or a handicapped toilet?: (Patient-Rptd) Yes  TIMED UP AND GO:  Was the test performed?  Yes  Length of time to ambulate 10 feet: 20 sec Gait slow and steady without use of assistive device    Cognitive Function:    02/27/2022   12:02 PM 10/24/2021   11:17 AM 04/12/2021    2:25 PM 02/11/2020    3:14 PM 02/11/2020    3:11 PM  MMSE - Mini Mental State Exam  Orientation to time 4 3 5 5 5   Orientation to Place 3 5 5 5 5   Registration 3 3 3     Attention/ Calculation 5 1 2 5 5   Recall 2 2 3  1   Language- name 2 objects 2 2 2     Language- repeat 1 1 1     Language- follow 3 step command 3 3 3 3     Language- read & follow direction 1 1 1     Write a sentence 1 1 1 1 1   Copy design 0 1 1    Total score 25 23 27         01/02/2021    1:27 PM 07/17/2020    9:19 AM 03/16/2020    9:40 AM  Montreal Cognitive Assessment   Visuospatial/ Executive (0/5) 5 5 4   Naming (0/3) 3  3 3  Attention: Read list of digits (0/2) 0 2 1  Attention: Read list of letters (0/1) 1 1 1   Attention: Serial 7 subtraction starting at 100 (0/3) 1 2 3   Language: Repeat phrase (0/2) 2 2 2   Language : Fluency (0/1) 1 0 1  Abstraction (0/2) 2 2 2   Delayed Recall (0/5) 1 2 1   Orientation (0/6) 6 6 6   Total 22 25 24   Adjusted Score (based on education)  25       10/27/2023   10:20 AM 10/21/2022   10:30 AM 10/01/2021    9:52 AM 09/15/2020   12:35 PM 08/09/2019   11:41 AM  6CIT Screen  What Year? 0 points 0 points 0 points 0 points 0 points  What month? 0 points 0 points 0 points 0 points 0 points  What time? 0 points 0 points 3 points  0 points  Count back from 20 0 points 0 points 0 points 0 points 0 points  Months in reverse 0 points 0 points 0 points 0 points 0 points  Repeat phrase 0 points 2 points 2 points 2 points 0 points  Total Score 0 points 2 points 5 points  0 points    Immunizations Immunization History  Administered Date(s) Administered   Fluad Quad(high Dose 65+) 07/02/2019, 07/24/2020, 07/19/2021, 08/07/2022   Fluad Trivalent(High Dose 65+) 08/19/2023   Influenza, High Dose Seasonal PF 07/14/2018   PFIZER Comirnaty(Gray Top)Covid-19 Tri-Sucrose Vaccine 11/07/2020   PFIZER(Purple Top)SARS-COV-2 Vaccination 01/20/2020, 02/14/2020   Pneumococcal Conjugate-13 07/24/2020   Pneumococcal Polysaccharide-23 11/13/2018   Tdap 08/05/2018   Zoster Recombinant(Shingrix) 06/05/2018, 08/05/2018    TDAP status: Up to date  Flu Vaccine status: Up to date  Pneumococcal vaccine status: Up to date  Covid-19 vaccine status: Information provided on how to obtain vaccines.   Qualifies for Shingles  Vaccine? Yes   Zostavax completed Yes   Shingrix Completed?: Yes  Screening Tests Health Maintenance  Topic Date Due   COVID-19 Vaccine (4 - 2024-25 season) 08/18/2024 (Originally 06/15/2023)   OPHTHALMOLOGY EXAM  11/26/2023   HEMOGLOBIN A1C  02/16/2024   Diabetic kidney evaluation - eGFR measurement  08/18/2024   Diabetic kidney evaluation - Urine ACR  08/18/2024   FOOT EXAM  08/18/2024   Medicare Annual Wellness (AWV)  10/26/2024   DEXA SCAN  09/22/2026   DTaP/Tdap/Td (2 - Td or Tdap) 08/05/2028   Colonoscopy  12/16/2030   Pneumonia Vaccine 30+ Years old  Completed   INFLUENZA VACCINE  Completed   Hepatitis C Screening  Completed   Zoster Vaccines- Shingrix  Completed   HPV VACCINES  Aged Out    Health Maintenance  There are no preventive care reminders to display for this patient.   Colorectal cancer screening: Type of screening: Colonoscopy. Completed 12/15/20. Repeat every 10 years  Mammogram status: Completed 12/14/22. Repeat every year  Bone Density status: Completed 09/23/23. Results reflect: Bone density results: NORMAL. Repeat every 2 years.   Additional Screening:  Hepatitis C Screening:  Completed 01/19/20  Vision Screening: Recommended annual ophthalmology exams for early detection of glaucoma and other disorders of the eye. Is the patient up to date with their annual eye exam?  Yes  Who is the provider or what is the name of the office in which the patient attends annual eye exams? Dr Octavia  If pt is not established with a provider, would they like to be referred to a provider to establish care? No .   Dental Screening:  Recommended annual dental exams for proper oral hygiene  Diabetic Foot Exam: Diabetic Foot Exam: Completed 08/19/23  Community Resource Referral / Chronic Care Management: CRR required this visit?  No   CCM required this visit?  No     Plan:     I have personally reviewed and noted the following in the patient's chart:   Medical and  social history Use of alcohol, tobacco or illicit drugs  Current medications and supplements including opioid prescriptions. Patient is currently taking opioid prescriptions. Information provided to patient regarding non-opioid alternatives. Patient advised to discuss non-opioid treatment plan with their provider. Functional ability and status Nutritional status Physical activity Advanced directives List of other physicians Hospitalizations, surgeries, and ER visits in previous 12 months Vitals Screenings to include cognitive, depression, and falls Referrals and appointments  In addition, I have reviewed and discussed with patient certain preventive protocols, quality metrics, and best practice recommendations. A written personalized care plan for preventive services as well as general preventive health recommendations were provided to patient.     Ellouise VEAR Haws, LPN   8/86/7974   After Visit Summary: (In Person-Printed) AVS printed and given to the patient  Nurse Notes: none

## 2023-10-27 NOTE — Patient Instructions (Signed)
 Catherine Mcconnell , Thank you for taking time to come for your Medicare Wellness Visit. I appreciate your ongoing commitment to your health goals. Please review the following plan we discussed and let me know if I can assist you in the future.   Referrals/Orders/Follow-Ups/Clinician Recommendations: maintain health and activity   This is a list of the screening recommended for you and due dates:  Health Maintenance  Topic Date Due   COVID-19 Vaccine (4 - 2024-25 season) 08/18/2024*   Eye exam for diabetics  11/26/2023   Hemoglobin A1C  02/16/2024   Yearly kidney function blood test for diabetes  08/18/2024   Yearly kidney health urinalysis for diabetes  08/18/2024   Complete foot exam   08/18/2024   Medicare Annual Wellness Visit  10/26/2024   DEXA scan (bone density measurement)  09/22/2026   DTaP/Tdap/Td vaccine (2 - Td or Tdap) 08/05/2028   Colon Cancer Screening  12/16/2030   Pneumonia Vaccine  Completed   Flu Shot  Completed   Hepatitis C Screening  Completed   Zoster (Shingles) Vaccine  Completed   HPV Vaccine  Aged Out  *Topic was postponed. The date shown is not the original due date.    Advanced directives: (In Chart) A copy of your advanced directives are scanned into your chart should your provider ever need it.  Next Medicare Annual Wellness Visit scheduled for next year: Yes

## 2023-10-28 ENCOUNTER — Other Ambulatory Visit (HOSPITAL_BASED_OUTPATIENT_CLINIC_OR_DEPARTMENT_OTHER): Payer: Self-pay

## 2023-10-28 ENCOUNTER — Other Ambulatory Visit: Payer: Self-pay

## 2023-10-28 ENCOUNTER — Telehealth: Payer: Medicare HMO | Admitting: Adult Health

## 2023-10-28 ENCOUNTER — Telehealth: Payer: Self-pay | Admitting: Adult Health

## 2023-10-28 DIAGNOSIS — G4733 Obstructive sleep apnea (adult) (pediatric): Secondary | ICD-10-CM

## 2023-10-28 NOTE — Patient Instructions (Signed)
 Continue using CPAP nightly and greater than 4 hours each night If your symptoms worsen or you develop new symptoms please let us know.

## 2023-10-28 NOTE — Telephone Encounter (Signed)
 Pt's daughter cx appt

## 2023-10-29 ENCOUNTER — Other Ambulatory Visit (HOSPITAL_COMMUNITY): Payer: Self-pay

## 2023-10-30 ENCOUNTER — Other Ambulatory Visit (HOSPITAL_BASED_OUTPATIENT_CLINIC_OR_DEPARTMENT_OTHER): Payer: Self-pay

## 2023-10-30 MED ORDER — ACCU-CHEK GUIDE W/DEVICE KIT
PACK | 0 refills | Status: DC
  Filled 2023-10-30: qty 1, 30d supply, fill #0

## 2023-10-30 MED ORDER — ACCU-CHEK GUIDE TEST VI STRP
1.0000 | ORAL_STRIP | Freq: Two times a day (BID) | 1 refills | Status: DC
  Filled 2023-10-30 – 2023-11-11 (×2): qty 200, 90d supply, fill #0
  Filled 2023-12-29 (×2): qty 200, 100d supply, fill #0
  Filled 2024-01-22 – 2024-01-26 (×2): qty 200, 90d supply, fill #0
  Filled 2024-06-16: qty 200, 90d supply, fill #1

## 2023-10-30 MED ORDER — ACCU-CHEK SOFTCLIX LANCETS MISC
1 refills | Status: DC
  Filled 2023-10-30: qty 200, 90d supply, fill #0
  Filled 2023-12-29 – 2024-01-22 (×2): qty 200, 90d supply, fill #1

## 2023-10-31 ENCOUNTER — Other Ambulatory Visit (HOSPITAL_BASED_OUTPATIENT_CLINIC_OR_DEPARTMENT_OTHER): Payer: Self-pay

## 2023-10-31 NOTE — Telephone Encounter (Signed)
Noted, found in chart approved on 10/22/2023.

## 2023-10-31 NOTE — Telephone Encounter (Signed)
Natalie, anyword on [place] for Rybelsus 14 mg?

## 2023-11-03 ENCOUNTER — Other Ambulatory Visit (HOSPITAL_BASED_OUTPATIENT_CLINIC_OR_DEPARTMENT_OTHER): Payer: Self-pay

## 2023-11-06 NOTE — Code Documentation (Signed)
completed

## 2023-11-11 ENCOUNTER — Other Ambulatory Visit (HOSPITAL_BASED_OUTPATIENT_CLINIC_OR_DEPARTMENT_OTHER): Payer: Self-pay

## 2023-11-11 ENCOUNTER — Other Ambulatory Visit: Payer: Self-pay

## 2023-11-12 ENCOUNTER — Other Ambulatory Visit (HOSPITAL_BASED_OUTPATIENT_CLINIC_OR_DEPARTMENT_OTHER): Payer: Self-pay

## 2023-11-13 ENCOUNTER — Telehealth: Payer: Self-pay

## 2023-11-13 NOTE — Progress Notes (Signed)
Care Guide Pharmacy Note  11/13/2023 Name: [name] MRN: [id number] DOB: 1946/07/27  Referred By: Jarold Motto, PA Reason for referral: Care Coordination ([facility] Diabetes. )   Catherine Mcconnell is a 78 y.o. year old female who is a primary care patient of Jarold Motto, [place].  Estill Bakes was referred to the pharmacist for assistance related to: DMII  Successful contact was made with the patient to discuss pharmacy services.  Patient declines engagement at this time. Contact information was provided to the patient should they wish to reach out for assistance at a later time.  [facility]  [facility], [facility] Health Care Management Assistant Direct Dial: [phone]  Fax: [phone]

## 2023-11-18 ENCOUNTER — Other Ambulatory Visit (HOSPITAL_BASED_OUTPATIENT_CLINIC_OR_DEPARTMENT_OTHER): Payer: Self-pay

## 2023-11-18 ENCOUNTER — Other Ambulatory Visit (HOSPITAL_COMMUNITY): Payer: Self-pay

## 2023-11-18 ENCOUNTER — Other Ambulatory Visit: Payer: Self-pay

## 2023-11-20 ENCOUNTER — Ambulatory Visit: Payer: No Typology Code available for payment source | Admitting: Physician Assistant

## 2023-11-20 ENCOUNTER — Telehealth (HOSPITAL_BASED_OUTPATIENT_CLINIC_OR_DEPARTMENT_OTHER): Payer: Self-pay | Admitting: *Deleted

## 2023-11-20 NOTE — Telephone Encounter (Signed)
 Received notification from [facility] Repatha  not covered  Reviewed chart and see where this is PA through 05/2024  Will forward to PA team to follow up and make sure no PA needed for patient

## 2023-11-21 ENCOUNTER — Telehealth: Payer: Self-pay | Admitting: Pharmacy Technician

## 2023-11-21 ENCOUNTER — Other Ambulatory Visit (HOSPITAL_COMMUNITY): Payer: Self-pay

## 2023-11-21 NOTE — Telephone Encounter (Signed)
 Pharmacy Patient Advocate Encounter  Received notification from [employer] that Prior Authorization for repatha  has been APPROVED from 10/15/23 to 10/13/24. Ran test claim, Copay is $12.15 one month. This test claim was processed through [facility]- copay amounts may vary at other pharmacies due to pharmacy/plan contracts, or as the patient moves through the different stages of their insurance plan.   PA #/Case ID/Reference #: [id number]

## 2023-11-21 NOTE — Telephone Encounter (Signed)
 Pharmacy Patient Advocate Encounter   Received notification from Pt Calls Messages that prior authorization for repatha  is required/requested.   Insurance verification completed.   The patient is insured through [employer] .   Per test claim: PA required; PA submitted to above mentioned insurance via CoverMyMeds Key/confirmation #/EOC [id number] Status is pending

## 2023-11-24 ENCOUNTER — Ambulatory Visit (INDEPENDENT_AMBULATORY_CARE_PROVIDER_SITE_OTHER): Payer: Medicare HMO | Admitting: Physician Assistant

## 2023-11-24 VITALS — BP 116/60 | HR 55 | Temp 97.1°F | Ht 66.0 in | Wt 140.5 lb

## 2023-11-24 DIAGNOSIS — E559 Vitamin D deficiency, unspecified: Secondary | ICD-10-CM | POA: Diagnosis not present

## 2023-11-24 DIAGNOSIS — E538 Deficiency of other specified B group vitamins: Secondary | ICD-10-CM | POA: Diagnosis not present

## 2023-11-24 DIAGNOSIS — R2689 Other abnormalities of gait and mobility: Secondary | ICD-10-CM

## 2023-11-24 DIAGNOSIS — E119 Type 2 diabetes mellitus without complications: Secondary | ICD-10-CM

## 2023-11-24 LAB — BASIC METABOLIC PANEL
BUN: 16 mg/dL (ref 6–23)
CO2: 28 meq/L (ref 19–32)
Calcium: 9.1 mg/dL (ref 8.4–10.5)
Chloride: 103 meq/L (ref 96–112)
Creatinine, Ser: 1.61 mg/dL — ABNORMAL HIGH (ref 0.40–1.20)
GFR: 30.72 mL/min — ABNORMAL LOW (ref >60.00)
Glucose, Bld: 128 mg/dL — ABNORMAL HIGH (ref 70–99)
Potassium: 3.8 meq/L (ref 3.5–5.1)
Sodium: 138 meq/L (ref 135–145)

## 2023-11-24 LAB — POCT GLYCOSYLATED HEMOGLOBIN (HGB A1C): Hemoglobin A1C: 6.6 % — AB (ref 4.0–5.6)

## 2023-11-24 MED ORDER — RYBELSUS 7 MG PO TABS: 7.0000 mg | ORAL_TABLET | Freq: Every day | ORAL | Status: DC

## 2023-11-24 NOTE — Progress Notes (Signed)
 Catherine Mcconnell is a 78 y.o. female here for a follow up of a pre-existing problem.  History of Present Illness:   Chief Complaint  Patient presents with   Diabetes   Diabetes Reports compliance and good tolerance of Farxiga  10 mg and Ryblesus 14 mg daily. She is agreeable to decreasing her Ryblesus to 7 mg.  Her daughter states that she has decreased her crackers and peanut butter intake.  She is currently taking oral Vitamin B-12 supplement every Sunday and vitamin-D weekly as well. This started 3 months ago. Will recheck levels today.  A1c today is 6.6 compared to 8.2 previously.  Denies hypoglycemic or hyperglycemic episodes or symptoms.  No further concerns or symptoms at this time.  Balance Issues Her daughter states that she has been complaining of balance issues.  She tends to stumble more often than normal She is  interested in starting aquatic physical therapy.  She is willing to start using a cane to avoid any falls.  Vitamin D  and B12 deficiency Has decreased her supplementation to weekly Would like levels rechecked  Past Medical History:  Diagnosis Date   Anemia    on meds   Arthritis    hips/wrists/back   Cataract    sx-bilateral   Chronic kidney disease    stage 3   Diabetes mellitus without complication (HCC)    on meds   Heart murmur    Hypertension    on meds   Iron deficiency    on oral iron supplements, has not required transfusion   Mixed hyperlipidemia    on meds   OSA on CPAP    compliant   Sleep apnea    uses CPAP   Thyroid  nodule    removed-not on meds at this time (12/01/2020)     Social History   Tobacco Use   Smoking status: Former    Current packs/day: 0.00    Average packs/day: 0.3 packs/day for 1 year (0.3 ttl pk-yrs)    Types: Cigarettes    Start date: 88    Quit date: 78    Years since quitting: 65.1   Smokeless tobacco: Never   Tobacco comments:    Smoked briefly in my twenties  Vaping Use   Vaping status: Never  Used  Substance Use Topics   Alcohol use: Not Currently   Drug use: Never    Past Surgical History:  Procedure Laterality Date   ABDOMINAL HYSTERECTOMY  1996   EYE SURGERY     JOINT REPLACEMENT  08/06/21   MASS EXCISION Right 2017   right arm mass removal   THYROID  SURGERY     left side removal benign nodule   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   Procedure: RIGHT TOTAL HIP ARTHROPLASTY ANTERIOR APPROACH;  Surgeon: Wes Hamman, MD;  Location: [facility] OR;  Service: Orthopedics;  Laterality: Right;  3-C    Family History  Problem Relation Age of Onset   Stroke Mother    Hypertension Mother    Heart disease Mother    Heart attack Father    Early death Father    Hypertension Father    Diabetes Sister    Early death Sister    Hypertension Sister    Stroke Sister    Diabetes Sister    Early death Sister    Hyperlipidemia Sister    Kidney disease Sister    Diabetes Sister    Hyperlipidemia Sister    Hypertension Sister    Diabetes Sister  Early death Brother    Arthritis Brother    Heart attack Brother    Heart disease Brother    Arthritis Brother    Memory loss Paternal Uncle    Depression Daughter    Hypertension Daughter    Hyperlipidemia Daughter    Prostate cancer Son    Cancer Son    Early death Son    Cancer Niece        gyn cancer   Colon polyps Neg Hx    Colon cancer Neg Hx    Esophageal cancer Neg Hx    Rectal cancer Neg Hx    Stomach cancer Neg Hx    Sleep apnea Neg Hx     Allergies  Allergen Reactions   Lisinopril Rash   Metformin  And Related Swelling    Current Medications:   Current Outpatient Medications:    Accu-Chek Softclix Lancets lancets, Use 1 lancet twice daily to test blood glucose levels., Disp: 200 each, Rfl: 1   amLODipine  (NORVASC ) 10 MG tablet, Take 1 tablet (10 mg total) by mouth daily., Disp: 90 tablet, Rfl: 1   aspirin  EC 81 MG tablet, Take 1 tablet (81 mg total) by mouth daily. Swallow whole., Disp: 90 tablet, Rfl:  3   atorvastatin  (LIPITOR) 80 MG tablet, Take 1 tablet by mouth every Monday, Wednesday, and Friday., Disp: 30 tablet, Rfl: 1   bimatoprost  (LUMIGAN ) 0.01 % SOLN, Place 1 drop into both eyes at bedtime., Disp: 7.5 mL, Rfl: 3   Blood Glucose Monitoring Suppl (ONE TOUCH ULTRA MINI) w/Device KIT, Use to check blood sugars twice a day., Disp: 1 kit, Rfl: 0   carvedilol  (COREG ) 6.25 MG tablet, Take 1 tablet (6.25 mg total) by mouth 2 (two) times daily with a meal., Disp: 180 tablet, Rfl: 1   dapagliflozin  propanediol (FARXIGA ) 10 MG TABS tablet, Take 1 tablet (10 mg total) by mouth daily., Disp: 30 tablet, Rfl: 11   donepezil  (ARICEPT  ODT) 10 MG disintegrating tablet, DISSOLVE 1 TABLET ON THE TONGUE  AT BEDTIME, Disp: 100 tablet, Rfl: 0   Evolocumab  (REPATHA  SURECLICK) 140 MG/ML SOAJ, Inject 140 mg into the skin every 14 (fourteen) days., Disp: 2 mL, Rfl: 5   gabapentin  (NEURONTIN ) 100 MG capsule, Take 2 capsules (200 mg total) by mouth at bedtime., Disp: 180 capsule, Rfl: 0   glucose blood (ACCU-CHEK GUIDE TEST) test strip, Use 1 test strip twice daily to test blood glucose levels., Disp: 200 each, Rfl: 1   glucose blood (ONETOUCH ULTRA BLUE TEST) test strip, Use to check blood sugars twice daily as needed, Disp: 200 each, Rfl: 4   glucose blood (ONETOUCH ULTRA) test strip, USE TO CHECK BLOOD SUGARS TWICE A DAY AND AS NEEDED., Disp: 200 strip, Rfl: 4   Lancets (ONETOUCH DELICA PLUS LANCET30G) MISC, Check twice daily before breakfast and dinner, Disp: 200 each, Rfl: 4   Lancets (ONETOUCH ULTRASOFT) lancets, CHECK TWICE DAILY BEFORE  BREAKFAST AND DINNER, Disp: 200 each, Rfl: 4   losartan  (COZAAR ) 50 MG tablet, Take 1 tablet (50 mg total) by mouth daily., Disp: 90 tablet, Rfl: 1   LUMIGAN  0.01 % SOLN, Place 1 drop into both eyes every evening., Disp: , Rfl:    mirtazapine  (REMERON ) 7.5 MG tablet, Take 1 tablet (7.5 mg total) by mouth at bedtime., Disp: 90 tablet, Rfl: 0   nitroGLYCERIN  (NITROSTAT ) 0.4 MG  SL tablet, Place 1 tablet (0.4 mg total) under the tongue every 5 (five) minutes as needed for chest pain., Disp:  25 tablet, Rfl: 2   OneTouch UltraSoft 2 Lancets MISC, Check twice daily before breakfast and dinner, Disp: 200 each, Rfl: 4   Semaglutide  (RYBELSUS ) 7 MG TABS, Take 1 tablet (7 mg total) by mouth daily., Disp: , Rfl:    traMADol  (ULTRAM ) 50 MG tablet, Take 1 tablet (50 mg total) by mouth every 12 (twelve) hours as needed for severe pain., Disp: 30 tablet, Rfl: 1   Review of Systems:   Review of Systems  Neurological:        +balance issues    Vitals:   Vitals:   11/24/23 1006  BP: 116/60  Pulse: (!) 55  Temp: (!) 97.1 F (36.2 C)  TempSrc: Temporal  SpO2: 99%  Weight: 140 lb 8 oz (63.7 kg)  Height: 5\' 6"  (1.676 m)     Body mass index is 22.68 kg/m.  Physical Exam:   Physical Exam Vitals and nursing note reviewed.  Constitutional:      General: She is not in acute distress.    Appearance: She is well-developed. She is not ill-appearing or toxic-appearing.  Cardiovascular:     Rate and Rhythm: Normal rate and regular rhythm.     Pulses: Normal pulses.     Heart sounds: Normal heart sounds, S1 normal and S2 normal.  Pulmonary:     Effort: Pulmonary effort is normal.     Breath sounds: Normal breath sounds.  Skin:    General: Skin is warm and dry.  Neurological:     Mental Status: She is alert.     GCS: GCS eye subscore is 4. GCS verbal subscore is 5. GCS motor subscore is 6.  Psychiatric:        Speech: Speech normal.        Behavior: Behavior normal. Behavior is cooperative.    Results for orders placed or performed in visit on 11/24/23  POCT glycosylated hemoglobin (Hb A1C)  Result Value Ref Range   Hemoglobin A1C 6.6 (A) 4.0 - 5.6 %    Assessment and Plan:   Diabetes mellitus without complication (HCC) Well controlled Continue farxiga  10 mg daily Decrease Rybelsus  to 7 mg daily Follow-up in 3-6 month(s), sooner if concerns  Vitamin D   deficiency Update level and provide recommendations  B12 deficiency Update level and provide recommendations  Balance disorder Referral to physical therapy Ordered cane DME for patient - encouraged regular use   Alexander Iba, PA-C  I,Safa M Kadhim,acting as a scribe for [employer], PA.,have documented all relevant documentation on the behalf of Alexander Iba, PA,as directed by  Alexander Iba, PA while in the presence of Alexander Iba, [place].   I, Alexander Iba, [place], have reviewed all documentation for this visit. The documentation on 11/24/23 for the exam, diagnosis, procedures, and orders are all accurate and complete.

## 2023-11-24 NOTE — Patient Instructions (Signed)
 It was great to see you!  Your Hemoglobin A1c is great! Decrease Rybelsus  to 7 mg daily   I will place physical therapy referral for aquatic therapy  Let's follow-up in 3 months, sooner if you have concerns.  Take care,  Alexander Iba PA-C

## 2023-11-25 ENCOUNTER — Encounter: Payer: Self-pay | Admitting: Physician Assistant

## 2023-11-26 ENCOUNTER — Ambulatory Visit: Payer: Medicare HMO | Attending: Physician Assistant

## 2023-11-26 ENCOUNTER — Other Ambulatory Visit: Payer: Self-pay

## 2023-11-26 DIAGNOSIS — M6281 Muscle weakness (generalized): Secondary | ICD-10-CM | POA: Diagnosis present

## 2023-11-26 DIAGNOSIS — R2689 Other abnormalities of gait and mobility: Secondary | ICD-10-CM | POA: Diagnosis not present

## 2023-11-26 DIAGNOSIS — R2681 Unsteadiness on feet: Secondary | ICD-10-CM | POA: Insufficient documentation

## 2023-11-26 NOTE — Therapy (Signed)
OUTPATIENT PHYSICAL THERAPY NEURO EVALUATION   Patient Name: Catherine Mcconnell MRN: [id number] DOB:04-Nov-1945, 78 y.o., female Today's Date: 11/26/2023   PCP: Jarold Motto, PA REFERRING PROVIDER: Jarold Motto, PA  END OF SESSION:  PT End of Session - 11/26/23 1017     Visit Number 1    Number of Visits 5    Date for PT Re-Evaluation 12/31/23    Authorization Type [employer]    PT Start Time 1015    PT Stop Time 1100    PT Time Calculation (min) 45 min             Past Medical History:  Diagnosis Date   Anemia    on meds   Arthritis    hips/wrists/back   Cataract    sx-bilateral   Chronic kidney disease    stage 3   Diabetes mellitus without complication (HCC)    on meds   Heart murmur    Hypertension    on meds   Iron deficiency    on oral iron supplements, has not required transfusion   Mixed hyperlipidemia    on meds   OSA on CPAP    compliant   Sleep apnea    uses CPAP   Thyroid nodule    removed-not on meds at this time (12/01/2020)   Past Surgical History:  Procedure Laterality Date   ABDOMINAL HYSTERECTOMY  1996   EYE SURGERY     JOINT REPLACEMENT  08/06/21   MASS EXCISION Right 2017   right arm mass removal   THYROID SURGERY     left side removal benign nodule   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   Procedure: RIGHT TOTAL HIP ARTHROPLASTY ANTERIOR APPROACH;  Surgeon: Tarry Kos, MD;  Location: [facility] OR;  Service: Orthopedics;  Laterality: Right;  3-C   Patient Active Problem List   Diagnosis Date Noted   Pain due to onychomycosis of toenails of both feet 08/22/2023   Neutropenia (HCC) 01/08/2023   Excessive daytime sleepiness 12/23/2022   Insufficient treatment with nasal CPAP 11/26/2022   Atypical facial pain 11/26/2022   Treatment-emergent central sleep apnea 11/26/2022   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) 11/26/2022   Agatston CAC score, >400 08/06/2022   Status post total replacement  of right hip 08/06/2021   Preop cardiovascular exam 06/04/2021   Avascular necrosis of bone of right hip (HCC) 05/02/2021   Type 2 diabetes mellitus with diabetic polyneuropathy, without long-term current use of insulin (HCC) 09/11/2020   Type 2 diabetes mellitus with stage 3a chronic kidney disease, without long-term current use of insulin (HCC) 09/11/2020   Type 2 diabetes mellitus with hyperglycemia, with long-term current use of insulin (HCC) 06/06/2020   Memory deficits 03/16/2020   Diabetes mellitus without complication (HCC) 11/13/2018   OSA on CPAP    Chronic kidney disease    Essential hypertension    Mixed hyperlipidemia     ONSET DATE: "comes and goes"  REFERRING DIAG: R26.89 (ICD-10-CM) - Balance disorder  THERAPY DIAG:  Unsteadiness on feet  Muscle weakness (generalized)  Rationale for Evaluation and Treatment: Rehabilitation  SUBJECTIVE:  SUBJECTIVE STATEMENT: Noting overall balance problems, noting that when stepping her legs feel unstable, denies buckling, locking, catching or any obvious knee pain. Hx of right THR. Denies any neuropathy affecting her feet. Denies dizziness or lightheadedness. Patient requesting aquatic therapy.  Pt accompanied by: family member  PERTINENT HISTORY: DM,   PAIN:  Are you having pain? No  PRECAUTIONS: None  RED FLAGS: None   WEIGHT BEARING RESTRICTIONS: No  FALLS: Has patient fallen in last 6 months? No  LIVING ENVIRONMENT: Lives with: lives with their family Lives in: House/apartment Stairs:  basement stairs, 1 step to enter, 4 steps to main part of house, no HR Has following equipment at home: Walker - 2 wheeled  PLOF: Independent  PATIENT GOALS: "whatever needs to be improved"  OBJECTIVE:  Note: Objective measures were completed  at Evaluation unless otherwise noted.  DIAGNOSTIC FINDINGS: n/a for episode  COGNITION: Overall cognitive status: Within functional limits for tasks assessed   SENSATION: Not tested, denies numbness/tingling/burning  COORDINATION: WNL  EDEMA:  none  MUSCLE TONE: WNL    POSTURE: No Significant postural limitations  LOWER EXTREMITY ROM:     WNL  LOWER EXTREMITY MMT:  resisted tests in sitting  MMT Right Eval Left Eval  Hip flexion 5 5  Hip extension    Hip abduction 4 5  Hip adduction 5 5  Hip internal rotation    Hip external rotation    Knee flexion 5 5  Knee extension 5 5  Ankle dorsiflexion 4- 5  Ankle plantarflexion    Ankle inversion    Ankle eversion    (Blank rows = not tested)  BED MOBILITY:  indep  TRANSFERS: Assistive device utilized: None  Sit to stand: Complete Independence Stand to sit: Complete Independence Chair to chair: Complete Independence Floor: NT    CURB:  Level of Assistance: Complete Independence Assistive device utilized: None Curb Comments:   STAIRS: Level of Assistance: Complete Independence Stair Negotiation Technique: Alternating Pattern  with No Rails Number of Stairs:   Height of Stairs:   Comments:   GAIT: Gait pattern: WFL Distance walked:  Assistive device utilized: None Level of assistance: Complete Independence Comments: decreased speed  FUNCTIONAL TESTS:  5 times sit to stand: 17 sec Berg Balance Scale: 50/56 Dynamic Gait Index: 19/24  M-CTSIB  Condition 1: Firm Surface, EO 30 Sec, Normal Sway  Condition 2: Firm Surface, EC 30 Sec, Mild Sway  Condition 3: Foam Surface, EO 30 Sec, Normal Sway  Condition 4: Foam Surface, EC 20 Sec, Moderate and Severe Sway   10 meter walk test: 13.19 sec = 2.4 ft/sec    [facility] PT Assessment - 11/26/23 0001       Standardized Balance Assessment   Standardized Balance Assessment Berg Balance Test;Dynamic Gait Index      Berg Balance Test   Sit to Stand Able  to stand without using hands and stabilize independently    Standing Unsupported Able to stand safely 2 minutes    Sitting with Back Unsupported but Feet Supported on Floor or Stool Able to sit safely and securely 2 minutes    Stand to Sit Sits safely with minimal use of hands    Transfers Able to transfer safely, minor use of hands    Standing Unsupported with Eyes Closed Able to stand 10 seconds safely    Standing Unsupported with Feet Together Able to place feet together independently and stand 1 minute safely    From Standing, Reach Forward with Outstretched  Arm Can reach confidently >25 cm (10")    From Standing Position, Pick up Object from Floor Able to pick up shoe safely and easily    From Standing Position, Turn to Look Behind Over each Shoulder Looks behind from both sides and weight shifts well    Turn 360 Degrees Able to turn 360 degrees safely in 4 seconds or less    Standing Unsupported, Alternately Place Feet on Step/Stool Able to stand independently and safely and complete 8 steps in 20 seconds    Standing Unsupported, One Foot in [employer] balance while stepping or standing    Standing on One Leg Able to lift leg independently and hold equal to or more than 3 seconds    Total Score 50      Dynamic Gait Index   Level Surface Mild Impairment    Change in Gait Speed Normal    Gait with Horizontal Head Turns Mild Impairment    Gait with Vertical Head Turns Moderate Impairment    Gait and Pivot Turn Normal    Step Over Obstacle Mild Impairment    Step Around Obstacles Normal    Steps Normal    Total Score 19                                                                                                                                         TREATMENT DATE: 11/26/23    PATIENT EDUCATION: Education details: assessment details, HEP initiation, discussion of evidence-based fall prevention activities Person educated: Patient and Child(ren) Education method:  Explanation and Handouts Education comprehension: verbalized understanding and needs further education  HOME EXERCISE PROGRAM: Access Code: [email or web address] URL: [email or web address] Date: 11/26/2023 Prepared by: Shary Decamp  Exercises - Corner Balance Feet Together With Eyes Closed  - 1 x daily - 7 x weekly - 3 sets - 30 sec hold - Corner Balance Feet Together: Eyes Open With Head Turns  - 1 x daily - 7 x weekly - 3 sets - 3 reps - Corner Balance Feet Together: Eyes Closed With Head Turns  - 1 x daily - 7 x weekly - 3 sets - 3 reps - Tandem Stance in Corner  - 1 x daily - 7 x weekly - 3 sets - 30 sec hold  GOALS: Goals reviewed with patient? Yes  SHORT TERM GOALS: Target date: same as LTG   LONG TERM GOALS: Target date: 12/31/2023    Patient will be independent in HEP to improve functional outcomes to include land and aquatic-based activities per pt request for ongoing performance at D/C Baseline:  Goal status: INITIAL  2.  Demo improved postural stability for safety with ADL per mild sway condition 4 M-CTSIB Baseline: 20 sec mod-severe Goal status: INITIAL  3.  Demo improved safety and balance with ambulation per score 22/24 Dynamic Gait Index Baseline: 19/24 Goal status: INITIAL    ASSESSMENT:  CLINICAL  IMPRESSION: Patient is a 78 y.o. lady who was seen today for physical therapy evaluation and treatment for Balance Disorder.  Demonstrates good BLE strength with exception of right tibialis anterior of 4-/5.  Low risk for falls evident by score 50/56 Berg Balance Test and 19/24 Dynamic Gait Index however, notable for imbalance when walking and performing head turns and stepping over obstacles with considerable unsteadiness under condition 4 M-CTSIB.  Pt requests trial of land and aquatic-based sessions to develop comprehensive HEP for her to complete to provide means for continued progression at time of D/C in both environments.  Initiated HEP to improve postural  stability and improve comfort and fluency with balance/mobility under multisensory conditions to improve safety with community ambulation   OBJECTIVE IMPAIRMENTS: decreased balance and decreased strength.   ACTIVITY LIMITATIONS: lifting and locomotion level  PARTICIPATION LIMITATIONS: community activity  PERSONAL FACTORS: Age, Time since onset of injury/illness/exacerbation, and 1-2 comorbidities: PMH  are also affecting patient's functional outcome.   REHAB POTENTIAL: Excellent  CLINICAL DECISION MAKING: Stable/uncomplicated  EVALUATION COMPLEXITY: Low  PLAN:  PT FREQUENCY: 1x/week  PT DURATION: 4 weeks  PLANNED INTERVENTIONS: 97110-Therapeutic exercises, 97530- Therapeutic activity, [id number]- Neuromuscular re-education, [phone]- Self Care, [id number]- Manual therapy, [phone]- Gait training, [phone]- Aquatic Therapy, Patient/Family education, Balance training, Stair training, Joint mobilization, Spinal mobilization, and DME instructions  PLAN FOR NEXT SESSION: review balance HEP and progress for unstable surfaces, develop aquatic HEP   12:41 PM, 11/26/23 M. Shary Decamp, PT, DPT Physical Therapist- [place] Office Number: [phone]   Referring diagnosis? R26.89 (ICD-10-CM) - Balance disorder Treatment diagnosis? (if different than referring diagnosis) R26.81, M62.81 What was this (referring dx) caused by? []  Surgery []  Fall [x]  Ongoing issue []  Arthritis []  Other: ____________  Laterality: []  Rt []  Lt [x]  Both  Check all possible CPT codes:  *CHOOSE 10 OR LESS*    See Planned Interventions listed in the Plan section of the Evaluation.

## 2023-11-28 ENCOUNTER — Other Ambulatory Visit (HOSPITAL_BASED_OUTPATIENT_CLINIC_OR_DEPARTMENT_OTHER): Payer: Self-pay

## 2023-12-01 ENCOUNTER — Encounter: Payer: Self-pay | Admitting: Physical Therapy

## 2023-12-01 ENCOUNTER — Ambulatory Visit: Payer: Medicare HMO | Admitting: Physical Therapy

## 2023-12-01 DIAGNOSIS — R2681 Unsteadiness on feet: Secondary | ICD-10-CM | POA: Diagnosis not present

## 2023-12-01 DIAGNOSIS — M6281 Muscle weakness (generalized): Secondary | ICD-10-CM

## 2023-12-01 NOTE — Therapy (Signed)
OUTPATIENT PHYSICAL THERAPY/AQUATIC TREATMENT NOTE   Patient Name: Catherine Mcconnell MRN: [id number] DOB:[birth year], 78 y.o., female Today's Date: 12/01/2023   PCP: Jarold Motto, PA REFERRING PROVIDER: Jarold Motto, PA  END OF SESSION:  PT End of Session - 12/01/23 1333     Visit Number 2    Number of Visits 5    Date for PT Re-Evaluation 12/31/23    Authorization Type [employer]    PT Start Time 1230    PT Stop Time 1325    PT Time Calculation (min) 55 min    Equipment Utilized During Treatment Other (comment)   aquatic step, barbells, large noodle   Activity Tolerance Patient tolerated treatment well    Behavior During Therapy WFL for tasks assessed/performed             Past Medical History:  Diagnosis Date   Anemia    on meds   Arthritis    hips/wrists/back   Cataract    sx-bilateral   Chronic kidney disease    stage 3   Diabetes mellitus without complication (HCC)    on meds   Heart murmur    Hypertension    on meds   Iron deficiency    on oral iron supplements, has not required transfusion   Mixed hyperlipidemia    on meds   OSA on CPAP    compliant   Sleep apnea    uses CPAP   Thyroid nodule    removed-not on meds at this time (12/01/2020)   Past Surgical History:  Procedure Laterality Date   ABDOMINAL HYSTERECTOMY  1996   EYE SURGERY     JOINT REPLACEMENT  08/06/21   MASS EXCISION Right 2017   right arm mass removal   THYROID SURGERY     left side removal benign nodule   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   Procedure: RIGHT TOTAL HIP ARTHROPLASTY ANTERIOR APPROACH;  Surgeon: Tarry Kos, MD;  Location: [facility] OR;  Service: Orthopedics;  Laterality: Right;  3-C   Patient Active Problem List   Diagnosis Date Noted   Pain due to onychomycosis of toenails of both feet 08/22/2023   Neutropenia (HCC) 01/08/2023   Excessive daytime sleepiness 12/23/2022   Insufficient treatment with nasal CPAP 11/26/2022   Atypical facial pain  11/26/2022   Treatment-emergent central sleep apnea 11/26/2022   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) 11/26/2022   Agatston CAC score, >400 08/06/2022   Status post total replacement of right hip 08/06/2021   Preop cardiovascular exam 06/04/2021   Avascular necrosis of bone of right hip (HCC) 05/02/2021   Type 2 diabetes mellitus with diabetic polyneuropathy, without long-term current use of insulin (HCC) 09/11/2020   Type 2 diabetes mellitus with stage 3a chronic kidney disease, without long-term current use of insulin (HCC) 09/11/2020   Type 2 diabetes mellitus with hyperglycemia, with long-term current use of insulin (HCC) 06/06/2020   Memory deficits 03/16/2020   Diabetes mellitus without complication (HCC) 11/13/2018   OSA on CPAP    Chronic kidney disease    Essential hypertension    Mixed hyperlipidemia     ONSET DATE: "comes and goes"  REFERRING DIAG: R26.89 (ICD-10-CM) - Balance disorder  THERAPY DIAG:  Unsteadiness on feet  Muscle weakness (generalized)  Rationale for Evaluation and Treatment: Rehabilitation  SUBJECTIVE:  SUBJECTIVE STATEMENT: Pt reports she is looking forward to doing pool exercise today - reports no changes since eval.  Pt reports she did some of the exercises given for HEP but not all of them.   Pt accompanied by: family member (daughter)   PERTINENT HISTORY: DM,   PAIN:  Are you having pain? No  PRECAUTIONS: None  RED FLAGS: None   WEIGHT BEARING RESTRICTIONS: No  FALLS: Has patient fallen in last 6 months? No  LIVING ENVIRONMENT: Lives with: lives with their family Lives in: House/apartment Stairs:  basement stairs, 1 step to enter, 4 steps to main part of house, no HR Has following equipment at home: Walker - 2  wheeled  PLOF: Independent  PATIENT GOALS: "whatever needs to be improved"  OBJECTIVE:  Note: Objective measures were completed at Evaluation unless otherwise noted.  DIAGNOSTIC FINDINGS: n/a for episode  COGNITION: Overall cognitive status: Within functional limits for tasks assessed   SENSATION: Not tested, denies numbness/tingling/burning  COORDINATION: WNL  EDEMA:  none  MUSCLE TONE: WNL    POSTURE: No Significant postural limitations  LOWER EXTREMITY ROM:     WNL  LOWER EXTREMITY MMT:  resisted tests in sitting  MMT Right Eval Left Eval  Hip flexion 5 5  Hip extension    Hip abduction 4 5  Hip adduction 5 5  Hip internal rotation    Hip external rotation    Knee flexion 5 5  Knee extension 5 5  Ankle dorsiflexion 4- 5  Ankle plantarflexion    Ankle inversion    Ankle eversion    (Blank rows = not tested)  BED MOBILITY:  indep  TRANSFERS: Assistive device utilized: None  Sit to stand: Complete Independence Stand to sit: Complete Independence Chair to chair: Complete Independence Floor: NT    CURB:  Level of Assistance: Complete Independence Assistive device utilized: None Curb Comments:   STAIRS: Level of Assistance: Complete Independence Stair Negotiation Technique: Alternating Pattern  with No Rails Number of Stairs:   Height of Stairs:   Comments:   GAIT: Gait pattern: WFL Distance walked:  Assistive device utilized: None Level of assistance: Complete Independence Comments: decreased speed  FUNCTIONAL TESTS:  5 times sit to stand: 17 sec Berg Balance Scale: 50/56 Dynamic Gait Index: 19/24  M-CTSIB  Condition 1: Firm Surface, EO 30 Sec, Normal Sway  Condition 2: Firm Surface, EC 30 Sec, Mild Sway  Condition 3: Foam Surface, EO 30 Sec, Normal Sway  Condition 4: Foam Surface, EC 20 Sec, Moderate and Severe Sway   10 meter walk test: 13.19 sec = 2.4 ft/sec                                                                                                                                   TREATMENT DATE: 12-01-23 - Aquatic PT Aquatic therapy at [facility] - pool temp 90 degrees  Patient seen for aquatic therapy today.  Treatment took place in water 3.6-4.5 feet deep depending upon activity.  Pt entered and exited the pool via step negotiation with use of hand rails with supervision.   Warm up - pt performed water walking forwards, backwards, sideways 18' x 4 reps each direction with use of noodle for UE support with PT assisting for stabilization prn; pt able to perform with less UE support/assist with balance as session progressed as pt acclimated to water and to perturbations produced by current  Marching in place 10 reps x 1 set - LE's only without UE movement - pt did not hold onto any floatation device but was close to pool wall for assist with balance prn  Marching slowly across width of pool - 18' x 4 reps holding onto large noodle, stabilized by PT  Pt performed alternating forward, backward and side kicks holding large bar bells for assist with balance - with CGA prn for balance recovery; 10 reps each leg  Pt performed standing with feet together (NBOS for increased challenge with balance) - stood with EO and then with EC for 10 secs - no LOB:  pt performed horizontal head turns 5 reps with EO and then vertical head turns 5 reps with EO; progressed to horizontal and vertical head turns 5 reps each with EC - no major LOB with this, possibly due to buoyancy of water providing support  Pt amb. 18' x 2 reps across pool with horizontal head turns - no floatation device used  Core stabilization exercise - pt stood with feet approx. 6" apart - pushed large noodle down in front of her and then slowly lifted noodle to surface of water - cues to tighten core for stability; pt performed 10 reps  Pt stood on one leg - made circles CW and then CCW - 10 reps with each leg for improved SLS - UE support on edge of pool for  assist with balance recovery  Pt performed balance/core stabilization exercise - held yellow barbells - stepped forward, pushed barbells forward 5 reps each leg; stepped laterally, performed shoulder horizontal abdct./adduction and then stepped backward pushed barbells forward (shoulder protraction) 5 reps each direction, each leg - minimal LOB but pt able to recover independently   Pt performed tandem walking with use of noodle for UE support 18' x 2 reps with min assist for stabilization of noodle for assist with balance  Ai Chi postures performed - "Enclosing" 10 reps (no difficulty);  "Freeing" 5 reps to each side with moderate LOB/minimal rotation noted:  "Balancing" with tactile cues for UE for correct sequence and coordination - 10 reps each side  Pt amb. Forwards, backwards and sideways 2 reps each at end of session without UE support on floatation device  Pt requires buoyancy of water for support for reduced fall risk with balance activities performed in aquatic environment compared to fall risk with performance of same exercises on land.  Viscosity of water is needed for resistance for strengthening and current of water provides perturbations for challenge for balance training    PATIENT EDUCATION: Education details: assessment details, HEP initiation, discussion of evidence-based fall prevention activities Person educated: Patient and Child(ren) Education method: Explanation and Handouts Education comprehension: verbalized understanding and needs further education  HOME EXERCISE PROGRAM: Access Code: [email or web address] URL: [email or web address] Date: 11/26/2023 Prepared by: Shary Decamp  Exercises - Corner Balance Feet Together With Eyes Closed  - 1 x daily - 7 x weekly - 3 sets - 30 sec hold - Corner Balance Feet  Together: Eyes Open With Head Turns  - 1 x daily - 7 x weekly - 3 sets - 3 reps - Corner Balance Feet Together: Eyes Closed With Head Turns  - 1 x daily - 7 x  weekly - 3 sets - 3 reps - Tandem Stance in Corner  - 1 x daily - 7 x weekly - 3 sets - 30 sec hold  GOALS: Goals reviewed with patient? Yes  SHORT TERM GOALS: Target date: same as LTG   LONG TERM GOALS: Target date: 12/31/2023    Patient will be independent in HEP to improve functional outcomes to include land and aquatic-based activities per pt request for ongoing performance at D/C Baseline:  Goal status: INITIAL  2.  Demo improved postural stability for safety with ADL per mild sway condition 4 M-CTSIB Baseline: 20 sec mod-severe Goal status: INITIAL  3.  Demo improved safety and balance with ambulation per score 22/24 Dynamic Gait Index Baseline: 19/24 Goal status: INITIAL    ASSESSMENT:  CLINICAL IMPRESSION: Aquatic PT session focused on dynamic gait with increased vestibular input incorporated and balance exercises to facilitate improved SLS on each leg.  Pt had difficulty performing tandem ambulation in 4.0' water depth but improved on 2nd rep (of 18' pool width distance) with practice.  Pt's balance improved as aquatic PT session progressed as pt acclimated to water current/perturbations.  Pt had no LOB with standing with feet together with EC performing horizontal and vertical head turns, partially due to support by buoyancy of water.  Pt tolerated exercises very well with progressive decrease in need for UE support  during session and had very minimal c/o fatigue at end of session.  Cont with POC.   OBJECTIVE IMPAIRMENTS: decreased balance and decreased strength.   ACTIVITY LIMITATIONS: lifting and locomotion level  PARTICIPATION LIMITATIONS: community activity  PERSONAL FACTORS: Age, Time since onset of injury/illness/exacerbation, and 1-2 comorbidities: PMH  are also affecting patient's functional outcome.   REHAB POTENTIAL: Excellent  CLINICAL DECISION MAKING: Stable/uncomplicated  EVALUATION COMPLEXITY: Low  PLAN:  PT FREQUENCY: 1x/week  PT DURATION: 4  weeks  PLANNED INTERVENTIONS: 97110-Therapeutic exercises, 97530- Therapeutic activity, 97112- Neuromuscular re-education, [phone]- Self Care, 60454- Manual therapy, [phone]- Gait training, [phone]- Aquatic Therapy, Patient/Family education, Balance training, Stair training, Joint mobilization, Spinal mobilization, and DME instructions  PLAN FOR NEXT SESSION: review balance HEP and progress for unstable surfaces  Aquatic - HEP to be issued near D/C   5:55 PM, 12/01/23 Kerry Fort, PT [facility] [phone]  Referring diagnosis? R26.89 (ICD-10-CM) - Balance disorder Treatment diagnosis? (if different than referring diagnosis) R26.81, M62.81 What was this (referring dx) caused by? []  Surgery []  Fall [x]  Ongoing issue []  Arthritis []  Other: ____________  Laterality: []  Rt []  Lt [x]  Both  Check all possible CPT codes:  *CHOOSE 10 OR LESS*    See Planned Interventions listed in the Plan section of the Evaluation.

## 2023-12-03 ENCOUNTER — Other Ambulatory Visit: Payer: Self-pay | Admitting: Neurology

## 2023-12-03 ENCOUNTER — Other Ambulatory Visit (HOSPITAL_COMMUNITY): Payer: Self-pay

## 2023-12-04 NOTE — Telephone Encounter (Signed)
Last seen on 10/28/23 Follow up scheduled on 10/26/24  Too soon to refill

## 2023-12-08 ENCOUNTER — Ambulatory Visit: Payer: Medicare HMO

## 2023-12-08 DIAGNOSIS — R2681 Unsteadiness on feet: Secondary | ICD-10-CM

## 2023-12-08 DIAGNOSIS — M6281 Muscle weakness (generalized): Secondary | ICD-10-CM

## 2023-12-08 NOTE — Therapy (Signed)
 OUTPATIENT PHYSICAL THERAPY/AQUATIC TREATMENT NOTE   Patient Name: Catherine Mcconnell MRN: [id number] DOB:[birth year], 78 y.o., female Today's Date: 12/08/2023   PCP: Jarold Motto, PA REFERRING PROVIDER: Jarold Motto, PA  END OF SESSION:  PT End of Session - 12/08/23 1319     Visit Number 3    Number of Visits 5    Date for PT Re-Evaluation 12/31/23    Authorization Type [employer]    PT Start Time 1318    PT Stop Time 1400    PT Time Calculation (min) 42 min    Equipment Utilized During Treatment Other (comment)   aquatic step, barbells, large noodle   Activity Tolerance Patient tolerated treatment well    Behavior During Therapy WFL for tasks assessed/performed             Past Medical History:  Diagnosis Date   Anemia    on meds   Arthritis    hips/wrists/back   Cataract    sx-bilateral   Chronic kidney disease    stage 3   Diabetes mellitus without complication (HCC)    on meds   Heart murmur    Hypertension    on meds   Iron deficiency    on oral iron supplements, has not required transfusion   Mixed hyperlipidemia    on meds   OSA on CPAP    compliant   Sleep apnea    uses CPAP   Thyroid nodule    removed-not on meds at this time (12/01/2020)   Past Surgical History:  Procedure Laterality Date   ABDOMINAL HYSTERECTOMY  1996   EYE SURGERY     JOINT REPLACEMENT  08/06/21   MASS EXCISION Right 2017   right arm mass removal   THYROID SURGERY     left side removal benign nodule   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   Procedure: RIGHT TOTAL HIP ARTHROPLASTY ANTERIOR APPROACH;  Surgeon: Tarry Kos, MD;  Location: [facility] OR;  Service: Orthopedics;  Laterality: Right;  3-C   Patient Active Problem List   Diagnosis Date Noted   Pain due to onychomycosis of toenails of both feet 08/22/2023   Neutropenia (HCC) 01/08/2023   Excessive daytime sleepiness 12/23/2022   Insufficient treatment with nasal CPAP 11/26/2022   Atypical facial pain  11/26/2022   Treatment-emergent central sleep apnea 11/26/2022   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) 11/26/2022   Agatston CAC score, >400 08/06/2022   Status post total replacement of right hip 08/06/2021   Preop cardiovascular exam 06/04/2021   Avascular necrosis of bone of right hip (HCC) 05/02/2021   Type 2 diabetes mellitus with diabetic polyneuropathy, without long-term current use of insulin (HCC) 09/11/2020   Type 2 diabetes mellitus with stage 3a chronic kidney disease, without long-term current use of insulin (HCC) 09/11/2020   Type 2 diabetes mellitus with hyperglycemia, with long-term current use of insulin (HCC) 06/06/2020   Memory deficits 03/16/2020   Diabetes mellitus without complication (HCC) 11/13/2018   OSA on CPAP    Chronic kidney disease    Essential hypertension    Mixed hyperlipidemia     ONSET DATE: "comes and goes"  REFERRING DIAG: R26.89 (ICD-10-CM) - Balance disorder  THERAPY DIAG:  Unsteadiness on feet  Muscle weakness (generalized)  Rationale for Evaluation and Treatment: Rehabilitation  SUBJECTIVE:  SUBJECTIVE STATEMENT: Doing ok, enjoyed the aquatic session Pt accompanied by: family member (daughter)   PERTINENT HISTORY: DM,   PAIN:  Are you having pain? No  PRECAUTIONS: None  RED FLAGS: None   WEIGHT BEARING RESTRICTIONS: No  FALLS: Has patient fallen in last 6 months? No  LIVING ENVIRONMENT: Lives with: lives with their family Lives in: House/apartment Stairs:  basement stairs, 1 step to enter, 4 steps to main part of house, no HR Has following equipment at home: Dan Humphreys - 2 wheeled  PLOF: Independent  PATIENT GOALS: "whatever needs to be improved"  OBJECTIVE:   TODAY'S TREATMENT: 12/08/23 Activity Comments   HEP review Excellent recall Repeated on foam --instances of LOB with foam  Sit to stand 3x5 W/ green loop to conteract knee valgus  Sidestepping x 2 min Green loop around [place] [employer] march 3x10 W/ green loop on feet  Resisted walking 2x2 min 15#       PATIENT EDUCATION: Education details: assessment details, HEP initiation, discussion of evidence-based fall prevention activities Person educated: Patient and Child(ren) Education method: Explanation and Handouts Education comprehension: verbalized understanding and needs further education  HOME EXERCISE PROGRAM: Access Code: [email or web address] URL: [email or web address] Date: 11/26/2023 Prepared by: Shary Decamp  Exercises - Corner Balance Feet Together With Eyes Closed  - 1 x daily - 7 x weekly - 3 sets - 30 sec hold - Corner Balance Feet Together: Eyes Open With Head Turns  - 1 x daily - 7 x weekly - 3 sets - 3 reps - Corner Balance Feet Together: Eyes Closed With Head Turns  - 1 x daily - 7 x weekly - 3 sets - 3 reps - Tandem Stance in Corner  - 1 x daily - 7 x weekly - 3 sets - 30 sec hold - Sit to Stand with Resistance Around Legs  - 1 x daily - 7 x weekly - 5 sets - 5 reps - Side Stepping with Resistance at Ankles and Counter Support  - 1 x daily - 7 x weekly - 2-3 sets - 2 minute rounds hold - Marching with Resistance  - 1 x daily - 7 x weekly - 3 sets - 10 reps  Note: Objective measures were completed at Evaluation unless otherwise noted.  DIAGNOSTIC FINDINGS: n/a for episode  COGNITION: Overall cognitive status: Within functional limits for tasks assessed   SENSATION: Not tested, denies numbness/tingling/burning  COORDINATION: WNL  EDEMA:  none  MUSCLE TONE: WNL    POSTURE: No Significant postural limitations  LOWER EXTREMITY ROM:     WNL  LOWER EXTREMITY MMT:  resisted tests in sitting  MMT Right Eval Left Eval  Hip flexion 5 5  Hip extension    Hip abduction 4 5  Hip adduction  5 5  Hip internal rotation    Hip external rotation    Knee flexion 5 5  Knee extension 5 5  Ankle dorsiflexion 4- 5  Ankle plantarflexion    Ankle inversion    Ankle eversion    (Blank rows = not tested)  BED MOBILITY:  indep  TRANSFERS: Assistive device utilized: None  Sit to stand: Complete Independence Stand to sit: Complete Independence Chair to chair: Complete Independence Floor: NT    CURB:  Level of Assistance: Complete Independence Assistive device utilized: None Curb Comments:   STAIRS: Level of Assistance: Complete Independence Stair Negotiation Technique: Alternating Pattern  with No Rails Number of Stairs:   Height of Stairs:   Comments:  GAIT: Gait pattern: WFL Distance walked:  Assistive device utilized: None Level of assistance: Complete Independence Comments: decreased speed  FUNCTIONAL TESTS:  5 times sit to stand: 17 sec Berg Balance Scale: 50/56 Dynamic Gait Index: 19/24  M-CTSIB  Condition 1: Firm Surface, EO 30 Sec, Normal Sway  Condition 2: Firm Surface, EC 30 Sec, Mild Sway  Condition 3: Foam Surface, EO 30 Sec, Normal Sway  Condition 4: Foam Surface, EC 20 Sec, Moderate and Severe Sway   10 meter walk test: 13.19 sec = 2.4 ft/sec                                                                                                                                  TREATMENT DATE: 12-01-23 - Aquatic PT Aquatic therapy at [facility] - pool temp 90 degrees  Patient seen for aquatic therapy today.  Treatment took place in water 3.6-4.5 feet deep depending upon activity.  Pt entered and exited the pool via step negotiation with use of hand rails with supervision.   Warm up - pt performed water walking forwards, backwards, sideways 18' x 4 reps each direction with use of noodle for UE support with PT assisting for stabilization prn; pt able to perform with less UE support/assist with balance as session progressed as pt acclimated to water and  to perturbations produced by current  Marching in place 10 reps x 1 set - LE's only without UE movement - pt did not hold onto any floatation device but was close to pool wall for assist with balance prn  Marching slowly across width of pool - 18' x 4 reps holding onto large noodle, stabilized by PT  Pt performed alternating forward, backward and side kicks holding large bar bells for assist with balance - with CGA prn for balance recovery; 10 reps each leg  Pt performed standing with feet together (NBOS for increased challenge with balance) - stood with EO and then with EC for 10 secs - no LOB:  pt performed horizontal head turns 5 reps with EO and then vertical head turns 5 reps with EO; progressed to horizontal and vertical head turns 5 reps each with EC - no major LOB with this, possibly due to buoyancy of water providing support  Pt amb. 18' x 2 reps across pool with horizontal head turns - no floatation device used  Core stabilization exercise - pt stood with feet approx. 6" apart - pushed large noodle down in front of her and then slowly lifted noodle to surface of water - cues to tighten core for stability; pt performed 10 reps  Pt stood on one leg - made circles CW and then CCW - 10 reps with each leg for improved SLS - UE support on edge of pool for assist with balance recovery  Pt performed balance/core stabilization exercise - held yellow barbells - stepped forward, pushed barbells forward 5 reps each leg; stepped laterally, performed shoulder  horizontal abdct./adduction and then stepped backward pushed barbells forward (shoulder protraction) 5 reps each direction, each leg - minimal LOB but pt able to recover independently   Pt performed tandem walking with use of noodle for UE support 18' x 2 reps with min assist for stabilization of noodle for assist with balance  Ai Chi postures performed - "Enclosing" 10 reps (no difficulty);  "Freeing" 5 reps to each side with moderate  LOB/minimal rotation noted:  "Balancing" with tactile cues for UE for correct sequence and coordination - 10 reps each side  Pt amb. Forwards, backwards and sideways 2 reps each at end of session without UE support on floatation device  Pt requires buoyancy of water for support for reduced fall risk with balance activities performed in aquatic environment compared to fall risk with performance of same exercises on land.  Viscosity of water is needed for resistance for strengthening and current of water provides perturbations for challenge for balance training      GOALS: Goals reviewed with patient? Yes  SHORT TERM GOALS: Target date: same as LTG   LONG TERM GOALS: Target date: 12/31/2023    Patient will be independent in HEP to improve functional outcomes to include land and aquatic-based activities per pt request for ongoing performance at D/C Baseline:  Goal status: INITIAL  2.  Demo improved postural stability for safety with ADL per mild sway condition 4 M-CTSIB Baseline: 20 sec mod-severe Goal status: INITIAL  3.  Demo improved safety and balance with ambulation per score 22/24 Dynamic Gait Index Baseline: 19/24 Goal status: INITIAL    ASSESSMENT:  CLINICAL IMPRESSION: Great recall to initial HEP and repeated with use of airex pad to good effect as this was particularly challenging and resulted in several retro-LOB with head movements whilst standing compliant surface. Instructed in new additions to HEP with bias to improving LE strength/power for greater mobility and instruction and use of resistance loop to correct her tendency for genu valgum collapse during transfers and imrpoved recruitment to hip abduction.  Dynamic balance vai resisted walking to end session to improve single limb stance and postural control. Continued sessions to progress POC details   OBJECTIVE IMPAIRMENTS: decreased balance and decreased strength.   ACTIVITY LIMITATIONS: lifting and locomotion  level  PARTICIPATION LIMITATIONS: community activity  PERSONAL FACTORS: Age, Time since onset of injury/illness/exacerbation, and 1-2 comorbidities: PMH  are also affecting patient's functional outcome.   REHAB POTENTIAL: Excellent  CLINICAL DECISION MAKING: Stable/uncomplicated  EVALUATION COMPLEXITY: Low  PLAN:  PT FREQUENCY: 1x/week  PT DURATION: 4 weeks  PLANNED INTERVENTIONS: 97110-Therapeutic exercises, 97530- Therapeutic activity, [id number]- Neuromuscular re-education, [phone]- Self Care, 19147- Manual therapy, [phone]- Gait training, [phone]- Aquatic Therapy, Patient/Family education, Balance training, Stair training, Joint mobilization, Spinal mobilization, and DME instructions  PLAN FOR NEXT SESSION: review balance HEP and progress for unstable surfaces  Aquatic - HEP to be issued near D/C   2:05 PM, 12/08/23 M. Shary Decamp, PT, DPT Physical Therapist- [place] Office Number: [phone]

## 2023-12-09 ENCOUNTER — Telehealth: Payer: Self-pay

## 2023-12-09 ENCOUNTER — Other Ambulatory Visit: Payer: Self-pay

## 2023-12-09 NOTE — Telephone Encounter (Signed)
 Patient was identified as falling into the [place] Measure - Diabetes.   Patient was scheduled and seen PCP 11/24/23; HbA1C completed at this visit; pt scheduled to come back in May. Nothing further needed at this time.

## 2023-12-16 ENCOUNTER — Encounter: Payer: Self-pay | Admitting: Rehabilitation

## 2023-12-16 ENCOUNTER — Ambulatory Visit: Payer: Medicare HMO | Attending: Physician Assistant | Admitting: Rehabilitation

## 2023-12-16 DIAGNOSIS — R2681 Unsteadiness on feet: Secondary | ICD-10-CM | POA: Insufficient documentation

## 2023-12-16 DIAGNOSIS — M6281 Muscle weakness (generalized): Secondary | ICD-10-CM | POA: Insufficient documentation

## 2023-12-16 NOTE — Therapy (Deleted)
 OUTPATIENT PHYSICAL THERAPY/AQUATIC TREATMENT NOTE   Patient Name: Catherine Mcconnell MRN: [id number] DOB:November 02, 1945, 78 y.o., female Today's Date: 12/16/2023   PCP: Jarold Motto, PA REFERRING PROVIDER: Jarold Motto, PA  END OF SESSION:  PT End of Session - 12/16/23 0818     Visit Number 4    Number of Visits 5    Date for PT Re-Evaluation 12/31/23    Authorization Type [employer]    Equipment Utilized During Treatment Other (comment)   aquatic step, barbells, large noodle   Activity Tolerance Patient tolerated treatment well    Behavior During Therapy WFL for tasks assessed/performed             Past Medical History:  Diagnosis Date   Anemia    on meds   Arthritis    hips/wrists/back   Cataract    sx-bilateral   Chronic kidney disease    stage 3   Diabetes mellitus without complication (HCC)    on meds   Heart murmur    Hypertension    on meds   Iron deficiency    on oral iron supplements, has not required transfusion   Mixed hyperlipidemia    on meds   OSA on CPAP    compliant   Sleep apnea    uses CPAP   Thyroid nodule    removed-not on meds at this time (12/01/2020)   Past Surgical History:  Procedure Laterality Date   ABDOMINAL HYSTERECTOMY  1996   EYE SURGERY     JOINT REPLACEMENT  08/06/21   MASS EXCISION Right 2017   right arm mass removal   THYROID SURGERY     left side removal benign nodule   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   Procedure: RIGHT TOTAL HIP ARTHROPLASTY ANTERIOR APPROACH;  Surgeon: Tarry Kos, MD;  Location: [facility] OR;  Service: Orthopedics;  Laterality: Right;  3-C   Patient Active Problem List   Diagnosis Date Noted   Pain due to onychomycosis of toenails of both feet 08/22/2023   Neutropenia (HCC) 01/08/2023   Excessive daytime sleepiness 12/23/2022   Insufficient treatment with nasal CPAP 11/26/2022   Atypical facial pain 11/26/2022   Treatment-emergent central sleep apnea 11/26/2022   Late onset  Alzheimer's dementia without behavioral disturbance (HCC) 11/26/2022   Agatston CAC score, >400 08/06/2022   Status post total replacement of right hip 08/06/2021   Preop cardiovascular exam 06/04/2021   Avascular necrosis of bone of right hip (HCC) 05/02/2021   Type 2 diabetes mellitus with diabetic polyneuropathy, without long-term current use of insulin (HCC) 09/11/2020   Type 2 diabetes mellitus with stage 3a chronic kidney disease, without long-term current use of insulin (HCC) 09/11/2020   Type 2 diabetes mellitus with hyperglycemia, with long-term current use of insulin (HCC) 06/06/2020   Memory deficits 03/16/2020   Diabetes mellitus without complication (HCC) 11/13/2018   OSA on CPAP    Chronic kidney disease    Essential hypertension    Mixed hyperlipidemia     ONSET DATE: "comes and goes"  REFERRING DIAG: R26.89 (ICD-10-CM) - Balance disorder  THERAPY DIAG:  Unsteadiness on feet  Muscle weakness (generalized)  Rationale for Evaluation and Treatment: Rehabilitation  SUBJECTIVE:  SUBJECTIVE STATEMENT: Pt reports she is looking forward to doing pool exercise today - reports no changes since eval.  Pt reports she did some of the exercises given for HEP but not all of them.   Pt accompanied by: family member (daughter)   PERTINENT HISTORY: DM,   PAIN:  Are you having pain? No  PRECAUTIONS: None  RED FLAGS: None   WEIGHT BEARING RESTRICTIONS: No  FALLS: Has patient fallen in last 6 months? No  LIVING ENVIRONMENT: Lives with: lives with their family Lives in: House/apartment Stairs:  basement stairs, 1 step to enter, 4 steps to main part of house, no HR Has following equipment at home: Walker - 2 wheeled  PLOF: Independent  PATIENT GOALS: "whatever needs to be  improved"  OBJECTIVE:  Note: Objective measures were completed at Evaluation unless otherwise noted.  DIAGNOSTIC FINDINGS: n/a for episode  COGNITION: Overall cognitive status: Within functional limits for tasks assessed   SENSATION: Not tested, denies numbness/tingling/burning  COORDINATION: WNL  EDEMA:  none  MUSCLE TONE: WNL    POSTURE: No Significant postural limitations  LOWER EXTREMITY ROM:     WNL  LOWER EXTREMITY MMT:  resisted tests in sitting  MMT Right Eval Left Eval  Hip flexion 5 5  Hip extension    Hip abduction 4 5  Hip adduction 5 5  Hip internal rotation    Hip external rotation    Knee flexion 5 5  Knee extension 5 5  Ankle dorsiflexion 4- 5  Ankle plantarflexion    Ankle inversion    Ankle eversion    (Blank rows = not tested)  BED MOBILITY:  indep  TRANSFERS: Assistive device utilized: None  Sit to stand: Complete Independence Stand to sit: Complete Independence Chair to chair: Complete Independence Floor: NT    CURB:  Level of Assistance: Complete Independence Assistive device utilized: None Curb Comments:   STAIRS: Level of Assistance: Complete Independence Stair Negotiation Technique: Alternating Pattern  with No Rails Number of Stairs:   Height of Stairs:   Comments:   GAIT: Gait pattern: WFL Distance walked:  Assistive device utilized: None Level of assistance: Complete Independence Comments: decreased speed  FUNCTIONAL TESTS:  5 times sit to stand: 17 sec Berg Balance Scale: 50/56 Dynamic Gait Index: 19/24  M-CTSIB  Condition 1: Firm Surface, EO 30 Sec, Normal Sway  Condition 2: Firm Surface, EC 30 Sec, Mild Sway  Condition 3: Foam Surface, EO 30 Sec, Normal Sway  Condition 4: Foam Surface, EC 20 Sec, Moderate and Severe Sway   10 meter walk test: 13.19 sec = 2.4 ft/sec                                                                                                                                   TREATMENT DATE: 12-16-23 - Aquatic PT Aquatic therapy at [facility] - pool temp 90 degrees  Patient seen for aquatic therapy today.  Treatment took place in water 3.6-4.5 feet deep depending upon activity.  Pt entered and exited the pool via step negotiation with use of hand rails with supervision.   Warm up - pt performed water walking forwards, backwards, sideways 18' x 4 reps each direction with use of noodle for UE support with PT assisting for stabilization prn; pt able to perform with less UE support/assist with balance as session progressed as pt acclimated to water and to perturbations produced by current      Pt requires buoyancy of water for support for reduced fall risk with balance activities performed in aquatic environment compared to fall risk with performance of same exercises on land.  Viscosity of water is needed for resistance for strengthening and current of water provides perturbations for challenge for balance training    PATIENT EDUCATION: Education details: assessment details, HEP initiation, discussion of evidence-based fall prevention activities Person educated: Patient and Child(ren) Education method: Explanation and Handouts Education comprehension: verbalized understanding and needs further education  HOME EXERCISE PROGRAM: Access Code: [email or web address] URL: [email or web address] Date: 11/26/2023 Prepared by: Shary Decamp  Exercises - Corner Balance Feet Together With Eyes Closed  - 1 x daily - 7 x weekly - 3 sets - 30 sec hold - Corner Balance Feet Together: Eyes Open With Head Turns  - 1 x daily - 7 x weekly - 3 sets - 3 reps - Corner Balance Feet Together: Eyes Closed With Head Turns  - 1 x daily - 7 x weekly - 3 sets - 3 reps - Tandem Stance in Corner  - 1 x daily - 7 x weekly - 3 sets - 30 sec hold  GOALS: Goals reviewed with patient? Yes  SHORT TERM GOALS: Target date: same as LTG   LONG TERM GOALS: Target date: 12/31/2023    Patient  will be independent in HEP to improve functional outcomes to include land and aquatic-based activities per pt request for ongoing performance at D/C Baseline:  Goal status: INITIAL  2.  Demo improved postural stability for safety with ADL per mild sway condition 4 M-CTSIB Baseline: 20 sec mod-severe Goal status: INITIAL  3.  Demo improved safety and balance with ambulation per score 22/24 Dynamic Gait Index Baseline: 19/24 Goal status: INITIAL    ASSESSMENT:  CLINICAL IMPRESSION: Aquatic PT session focused on dynamic gait with increased vestibular input incorporated and balance exercises to facilitate improved SLS on each leg.    OBJECTIVE IMPAIRMENTS: decreased balance and decreased strength.   ACTIVITY LIMITATIONS: lifting and locomotion level  PARTICIPATION LIMITATIONS: community activity  PERSONAL FACTORS: Age, Time since onset of injury/illness/exacerbation, and 1-2 comorbidities: PMH  are also affecting patient's functional outcome.   REHAB POTENTIAL: Excellent  CLINICAL DECISION MAKING: Stable/uncomplicated  EVALUATION COMPLEXITY: Low  PLAN:  PT FREQUENCY: 1x/week  PT DURATION: 4 weeks  PLANNED INTERVENTIONS: 97110-Therapeutic exercises, 97530- Therapeutic activity, 97112- Neuromuscular re-education, [phone]- Self Care, 21308- Manual therapy, [phone]- Gait training, [phone]- Aquatic Therapy, Patient/Family education, Balance training, Stair training, Joint mobilization, Spinal mobilization, and DME instructions  PLAN FOR NEXT SESSION: review balance HEP and progress for unstable surfaces  Aquatic - HEP to be issued near D/C   8:19 AM, 12/16/23 Kerry Fort, PT [facility] [phone]  Referring diagnosis? R26.89 (ICD-10-CM) - Balance disorder Treatment diagnosis? (if different than referring diagnosis) R26.81, M62.81 What was this (referring dx) caused by? []  Surgery []  Fall [x]  Ongoing issue []  Arthritis []  Other:  ____________  Laterality: []  Rt []  Lt [x]  Both  Check all possible CPT codes:  *CHOOSE 10 OR LESS*    See Planned Interventions listed in the Plan section of the Evaluation.

## 2023-12-18 ENCOUNTER — Other Ambulatory Visit: Payer: Self-pay

## 2023-12-19 ENCOUNTER — Ambulatory Visit: Attending: Physician Assistant

## 2023-12-19 DIAGNOSIS — R2681 Unsteadiness on feet: Secondary | ICD-10-CM | POA: Diagnosis not present

## 2023-12-19 DIAGNOSIS — M6281 Muscle weakness (generalized): Secondary | ICD-10-CM | POA: Diagnosis not present

## 2023-12-19 NOTE — Therapy (Signed)
 OUTPATIENT PHYSICAL THERAPY/AQUATIC TREATMENT NOTE, and D/C Summary   Patient Name: Catherine Mcconnell MRN: [id number] DOB:[birth year], 78 y.o., female Today's Date: 12/19/2023   PCP: Jarold Motto, PA REFERRING PROVIDER: Jarold Motto, PA  PHYSICAL THERAPY DISCHARGE SUMMARY  Visits from Start of Care: 5  Current functional level related to goals / functional outcomes: All goals met, see below for outcome measures   Remaining deficits: Some unsteadiness when ambulating and performing head turns   Education / Equipment: HEP    Patient agrees to discharge. Patient goals were not met. Patient is being discharged due to meeting the stated rehab goals.  END OF SESSION:  PT End of Session - 12/19/23 0846     Visit Number 5    Number of Visits 5    Date for PT Re-Evaluation 12/31/23    Authorization Type [employer]    PT Start Time [phone]    PT Stop Time 0930    PT Time Calculation (min) 44 min    Equipment Utilized During Treatment Other (comment)   aquatic step, barbells, large noodle   Activity Tolerance Patient tolerated treatment well    Behavior During Therapy WFL for tasks assessed/performed             Past Medical History:  Diagnosis Date   Anemia    on meds   Arthritis    hips/wrists/back   Cataract    sx-bilateral   Chronic kidney disease    stage 3   Diabetes mellitus without complication (HCC)    on meds   Heart murmur    Hypertension    on meds   Iron deficiency    on oral iron supplements, has not required transfusion   Mixed hyperlipidemia    on meds   OSA on CPAP    compliant   Sleep apnea    uses CPAP   Thyroid nodule    removed-not on meds at this time (12/01/2020)   Past Surgical History:  Procedure Laterality Date   ABDOMINAL HYSTERECTOMY  1996   EYE SURGERY     JOINT REPLACEMENT  08/06/21   MASS EXCISION Right 2017   right arm mass removal   THYROID SURGERY     left side removal benign nodule   TOTAL HIP ARTHROPLASTY  Right 08/06/2021   Procedure: RIGHT TOTAL HIP ARTHROPLASTY ANTERIOR APPROACH;  Surgeon: Tarry Kos, MD;  Location: [facility] OR;  Service: Orthopedics;  Laterality: Right;  3-C   Patient Active Problem List   Diagnosis Date Noted   Pain due to onychomycosis of toenails of both feet 08/22/2023   Neutropenia (HCC) 01/08/2023   Excessive daytime sleepiness 12/23/2022   Insufficient treatment with nasal CPAP 11/26/2022   Atypical facial pain 11/26/2022   Treatment-emergent central sleep apnea 11/26/2022   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) 11/26/2022   Agatston CAC score, >400 08/06/2022   Status post total replacement of right hip 08/06/2021   Preop cardiovascular exam 06/04/2021   Avascular necrosis of bone of right hip (HCC) 05/02/2021   Type 2 diabetes mellitus with diabetic polyneuropathy, without long-term current use of insulin (HCC) 09/11/2020   Type 2 diabetes mellitus with stage 3a chronic kidney disease, without long-term current use of insulin (HCC) 09/11/2020   Type 2 diabetes mellitus with hyperglycemia, with long-term current use of insulin (HCC) 06/06/2020   Memory deficits 03/16/2020   Diabetes mellitus without complication (HCC) 11/13/2018   OSA on CPAP    Chronic kidney disease  Essential hypertension    Mixed hyperlipidemia     ONSET DATE: "comes and goes"  REFERRING DIAG: R26.89 (ICD-10-CM) - Balance disorder  THERAPY DIAG:  Unsteadiness on feet  Muscle weakness (generalized)  Rationale for Evaluation and Treatment: Rehabilitation  SUBJECTIVE:                                                                                                                                                                                             SUBJECTIVE STATEMENT: Doing well, enjoyed the pool sessions.  Pt accompanied by: family member (daughter)   PERTINENT HISTORY: DM,   PAIN:  Are you having pain? No  PRECAUTIONS: None  RED  FLAGS: None   WEIGHT BEARING RESTRICTIONS: No  FALLS: Has patient fallen in last 6 months? No  LIVING ENVIRONMENT: Lives with: lives with their family Lives in: House/apartment Stairs:  basement stairs, 1 step to enter, 4 steps to main part of house, no HR Has following equipment at home: Dan Humphreys - 2 wheeled  PLOF: Independent  PATIENT GOALS: "whatever needs to be improved"  OBJECTIVE:   TODAY'S TREATMENT: 12/19/23 Activity Comments  DGI 22/24  Gait speed: 8.97 sec 3.6 ft/sec  M-CTSIB   HEP review Advanced resistance to blue            M-CTSIB  Condition 1: Firm Surface, EO 30 Sec, Normal Sway  Condition 2: Firm Surface, EC 30 Sec, Mild Sway  Condition 3: Foam Surface, EO 30 Sec, Normal Sway  Condition 4: Foam Surface, EC 30 Sec, Mild and    Sway     [facility] PT Assessment - 12/19/23 0001       Dynamic Gait Index   Level Surface Normal    Change in Gait Speed Normal    Gait with Horizontal Head Turns Mild Impairment    Gait with Vertical Head Turns Mild Impairment    Gait and Pivot Turn Normal    Step Over Obstacle Normal    Step Around Obstacles Normal    Steps Normal    Total Score 22             Note: Objective measures were completed at Evaluation unless otherwise noted.  DIAGNOSTIC FINDINGS: n/a for episode  COGNITION: Overall cognitive status: Within functional limits for tasks assessed   SENSATION: Not tested, denies numbness/tingling/burning  COORDINATION: WNL  EDEMA:  none  MUSCLE TONE: WNL    POSTURE: No Significant postural limitations  LOWER EXTREMITY ROM:     WNL  LOWER EXTREMITY MMT:  resisted tests in sitting  MMT Right Eval Left Eval  Hip flexion 5 5  Hip  extension    Hip abduction 4 5  Hip adduction 5 5  Hip internal rotation    Hip external rotation    Knee flexion 5 5  Knee extension 5 5  Ankle dorsiflexion 4- 5  Ankle plantarflexion    Ankle inversion    Ankle eversion    (Blank rows = not tested)  BED  MOBILITY:  indep  TRANSFERS: Assistive device utilized: None  Sit to stand: Complete Independence Stand to sit: Complete Independence Chair to chair: Complete Independence Floor: NT    CURB:  Level of Assistance: Complete Independence Assistive device utilized: None Curb Comments:   STAIRS: Level of Assistance: Complete Independence Stair Negotiation Technique: Alternating Pattern  with No Rails Number of Stairs:   Height of Stairs:   Comments:   GAIT: Gait pattern: WFL Distance walked:  Assistive device utilized: None Level of assistance: Complete Independence Comments: decreased speed  FUNCTIONAL TESTS:  5 times sit to stand: 17 sec Berg Balance Scale: 50/56 Dynamic Gait Index: 19/24  M-CTSIB  Condition 1: Firm Surface, EO 30 Sec, Normal Sway  Condition 2: Firm Surface, EC 30 Sec, Mild Sway  Condition 3: Foam Surface, EO 30 Sec, Normal Sway  Condition 4: Foam Surface, EC 20 Sec, Moderate and Severe Sway   10 meter walk test: 13.19 sec = 2.4 ft/sec                                                                                                                                  TREATMENT DATE: 12-16-23 - Aquatic PT Aquatic therapy at [facility] - pool temp 90 degrees  Patient seen for aquatic therapy today.  Treatment took place in water 3.6-4.5 feet deep depending upon activity.  Pt entered and exited the pool via step negotiation with use of hand rails with supervision.   Warm up - pt performed water walking forwards, backwards, sideways 18' x 4 reps each direction with use of noodle for UE support with PT assisting for stabilization prn; pt able to perform with less UE support/assist with balance as session progressed as pt acclimated to water and to perturbations produced by current      Pt requires buoyancy of water for support for reduced fall risk with balance activities performed in aquatic environment compared to fall risk with performance of same  exercises on land.  Viscosity of water is needed for resistance for strengthening and current of water provides perturbations for challenge for balance training    PATIENT EDUCATION: Education details: assessment details, HEP initiation, discussion of evidence-based fall prevention activities Person educated: Patient and Child(ren) Education method: Explanation and Handouts Education comprehension: verbalized understanding and needs further education  HOME EXERCISE PROGRAM: Access Code: [email or web address] URL: [email or web address] Date: 12/19/2023 Prepared by: Shary Decamp  Exercises - Corner Balance Feet Together With Eyes Closed  - 1 x daily - 7 x weekly - 3 sets - 30 sec hold - Corner Balance  Feet Together: Eyes Open With Head Turns  - 1 x daily - 7 x weekly - 3 sets - 3 reps - Corner Balance Feet Together: Eyes Closed With Head Turns  - 1 x daily - 7 x weekly - 3 sets - 3 reps - Tandem Stance in Corner  - 1 x daily - 7 x weekly - 3 sets - 30 sec hold - Sit to Stand with Resistance Around Legs  - 1 x daily - 7 x weekly - 5 sets - 5 reps - Side Stepping with Resistance at Ankles and Counter Support  - 1 x daily - 7 x weekly - 2-3 sets - 2 minute rounds hold - Marching with Resistance  - 1 x daily - 7 x weekly - 3 sets - 10 reps  GOALS: Goals reviewed with patient? Yes  SHORT TERM GOALS: Target date: same as LTG   LONG TERM GOALS: Target date: 12/31/2023    Patient will be independent in HEP to improve functional outcomes to include land and aquatic-based activities per pt request for ongoing performance at D/C Baseline:  Goal status: MET  2.  Demo improved postural stability for safety with ADL per mild sway condition 4 M-CTSIB Baseline: 20 sec mod-severe; (12/19/23) mild Goal status: MET  3.  Demo improved safety and balance with ambulation per score 22/24 Dynamic Gait Index Baseline: 19/24; (12/19/22) 22/24 Goal status: GOAL MET    ASSESSMENT:  CLINICAL  IMPRESSION: POC performance and review with improved score to 22/24 Dynamic Gait Index from initial 19/24 with chief issue being unsteadiness when walking and performing head turns/vertical movements.  Demonstrates faster gait velocity from initial 1.6 mph to 2.4 mph! Improved postural stability evident by condition 4 M-CTSIB. Demonstrates great recall and performance to HEP with ability to advance resistance from green to blue t-band.  Pt reports she and daughter intend to join fitness facility and discussed benefits of typical classes that facilities offer such as balance/tai chi and water aerobics classes for instructor-led design and endorsed meeting with trainer at sign-up for tutorial on compound resistance machines. Verbalized understanding. Will D/C to HEP at this time goals met.  OBJECTIVE IMPAIRMENTS: decreased balance and decreased strength.   ACTIVITY LIMITATIONS: lifting and locomotion level  PARTICIPATION LIMITATIONS: community activity  PERSONAL FACTORS: Age, Time since onset of injury/illness/exacerbation, and 1-2 comorbidities: PMH  are also affecting patient's functional outcome.   REHAB POTENTIAL: Excellent  CLINICAL DECISION MAKING: Stable/uncomplicated  EVALUATION COMPLEXITY: Low  PLAN:  PT FREQUENCY: 1x/week  PT DURATION: 4 weeks  PLANNED INTERVENTIONS: 97110-Therapeutic exercises, 97530- Therapeutic activity, [id number]- Neuromuscular re-education, [phone]- Self Care, 40981- Manual therapy, [phone]- Gait training, [phone]- Aquatic Therapy, Patient/Family education, Balance training, Stair training, Joint mobilization, Spinal mobilization, and DME instructions  PLAN FOR NEXT SESSION: rD/C to HEP  9:35 AM, 12/19/23 M. Shary Decamp, PT, DPT Physical Therapist- [place] Office Number: [phone]

## 2023-12-22 ENCOUNTER — Ambulatory Visit: Payer: Medicare HMO

## 2023-12-24 ENCOUNTER — Other Ambulatory Visit (HOSPITAL_COMMUNITY): Payer: Self-pay

## 2023-12-29 ENCOUNTER — Other Ambulatory Visit: Payer: Self-pay

## 2023-12-29 ENCOUNTER — Other Ambulatory Visit (HOSPITAL_BASED_OUTPATIENT_CLINIC_OR_DEPARTMENT_OTHER): Payer: Self-pay

## 2024-01-15 ENCOUNTER — Ambulatory Visit: Payer: Medicare HMO | Admitting: Adult Health

## 2024-01-22 ENCOUNTER — Other Ambulatory Visit (HOSPITAL_BASED_OUTPATIENT_CLINIC_OR_DEPARTMENT_OTHER): Payer: Self-pay

## 2024-01-26 ENCOUNTER — Other Ambulatory Visit (HOSPITAL_BASED_OUTPATIENT_CLINIC_OR_DEPARTMENT_OTHER): Payer: Self-pay

## 2024-01-30 ENCOUNTER — Other Ambulatory Visit: Payer: Self-pay

## 2024-02-11 ENCOUNTER — Encounter: Payer: Self-pay | Admitting: Adult Health

## 2024-02-24 ENCOUNTER — Ambulatory Visit: Payer: No Typology Code available for payment source | Admitting: Physician Assistant

## 2024-02-25 NOTE — Telephone Encounter (Signed)
 I called and spoke to daughter, Doristine Garrison, who is pt of our as well.  She said she did not see mychart message, but her mother did received supplies.  She did not know if was Research scientist (life sciences).  Will check and then let us  know.

## 2024-03-04 ENCOUNTER — Other Ambulatory Visit (HOSPITAL_BASED_OUTPATIENT_CLINIC_OR_DEPARTMENT_OTHER): Payer: Self-pay

## 2024-03-04 ENCOUNTER — Other Ambulatory Visit: Payer: Self-pay | Admitting: Pharmacist

## 2024-03-04 ENCOUNTER — Encounter: Payer: Self-pay | Admitting: Pharmacist

## 2024-03-04 ENCOUNTER — Other Ambulatory Visit: Payer: Self-pay

## 2024-03-04 MED ORDER — MIRTAZAPINE 7.5 MG PO TABS
7.5000 mg | ORAL_TABLET | Freq: Every day | ORAL | 0 refills | Status: DC
  Filled 2024-03-04: qty 90, 90d supply, fill #0

## 2024-03-04 NOTE — Telephone Encounter (Signed)
 Daughter states patient will run out of mirtazapine  before her upcoming appt with PCP 03/19/2024. Requesting refill.

## 2024-03-04 NOTE — Progress Notes (Addendum)
 Pharmacy Quality Measure Review  This patient is appearing on a report for being at risk of failing the adherence measure for cholesterol (statin) medications this calendar year.   Medication: atorvastatin  80mg  Last fill date: 10/22/2023 for 70 day supply - per report but per Dr Anson Basta database she last filled #36 atorvastatin  80mg  on 10/01/2023 which is a 84 day supply. It looks like maybe refills from [facility] are not showing in Dr [employer].  Since she had #36 or 84 day supply filled 10/01/2023 and #30 or 70 day supply filled 10/22/2023 - she should have enough atorvastatin  to last to around 03/03/2024 - should be due now / soon.  It looks like she might be due to refill other medications as well including: amlodipine  10mg , donepezil , cardevilol, losartan  - all filled 90 day supply on 10/27/2023 Repatha  - filled 28 days supply on 10/27/2023 - past due Rybelsus  - not showing as having been refilled recently Farxiga  - last refilled 02/02/2024 - cost was $0 per pharmacy records.     Left voicemail for patient to return my call at their convenience. and MyChart message sent to patient.  If she is taking Rybelsus  she will likely need a new Rx sent to her pharmacy of choice.   Follow up appt is already scheduled for 03/17/2024 with PCP   Cecilie Coffee, PharmD Clinical Pharmacist [facility]  [facility] [phone]   03/04/2024 - spoke to patient's daughter, Lettie Ray. Ms.Loeper is actually in [place]  caring for her mother in law who has ad breast cancer. Ms. Pucciarelli will return to [place] next week. Her daughters have been helping with medications. Ms. Welker insuranc echanged in 2025 and she received refill of many of her medications at the end of December and then again in January for 90 days.  The only medication that she will need before her appointment with Marnie Siren on 03/19/2024 is mirtazapine   Daughter endorses that they have 33 tablets of Rybelsus  left and about  1 month of Repatha   Will send request for mirtazapine  refill to PCP.   Cecilie Coffee, PharmD Clinical Pharmacist [facility] [phone] .

## 2024-03-09 ENCOUNTER — Other Ambulatory Visit: Payer: Self-pay

## 2024-03-09 ENCOUNTER — Other Ambulatory Visit (HOSPITAL_BASED_OUTPATIENT_CLINIC_OR_DEPARTMENT_OTHER): Payer: Self-pay

## 2024-03-09 ENCOUNTER — Other Ambulatory Visit: Payer: Self-pay | Admitting: Neurology

## 2024-03-17 ENCOUNTER — Other Ambulatory Visit (HOSPITAL_BASED_OUTPATIENT_CLINIC_OR_DEPARTMENT_OTHER): Payer: Self-pay

## 2024-03-17 ENCOUNTER — Encounter: Payer: Self-pay | Admitting: Physician Assistant

## 2024-03-17 ENCOUNTER — Ambulatory Visit (INDEPENDENT_AMBULATORY_CARE_PROVIDER_SITE_OTHER): Admitting: Physician Assistant

## 2024-03-17 ENCOUNTER — Other Ambulatory Visit: Payer: Self-pay

## 2024-03-17 VITALS — BP 160/84 | HR 67 | Temp 97.3°F | Ht 66.0 in | Wt 136.0 lb

## 2024-03-17 DIAGNOSIS — I129 Hypertensive chronic kidney disease with stage 1 through stage 4 chronic kidney disease, or unspecified chronic kidney disease: Secondary | ICD-10-CM | POA: Diagnosis not present

## 2024-03-17 DIAGNOSIS — N2581 Secondary hyperparathyroidism of renal origin: Secondary | ICD-10-CM | POA: Diagnosis not present

## 2024-03-17 DIAGNOSIS — N189 Chronic kidney disease, unspecified: Secondary | ICD-10-CM | POA: Diagnosis not present

## 2024-03-17 DIAGNOSIS — E1122 Type 2 diabetes mellitus with diabetic chronic kidney disease: Secondary | ICD-10-CM | POA: Diagnosis not present

## 2024-03-17 DIAGNOSIS — Z7984 Long term (current) use of oral hypoglycemic drugs: Secondary | ICD-10-CM

## 2024-03-17 DIAGNOSIS — I10 Essential (primary) hypertension: Secondary | ICD-10-CM

## 2024-03-17 DIAGNOSIS — E119 Type 2 diabetes mellitus without complications: Secondary | ICD-10-CM

## 2024-03-17 DIAGNOSIS — E785 Hyperlipidemia, unspecified: Secondary | ICD-10-CM | POA: Diagnosis not present

## 2024-03-17 DIAGNOSIS — N1832 Chronic kidney disease, stage 3b: Secondary | ICD-10-CM | POA: Diagnosis not present

## 2024-03-17 DIAGNOSIS — D631 Anemia in chronic kidney disease: Secondary | ICD-10-CM | POA: Diagnosis not present

## 2024-03-17 LAB — BASIC METABOLIC PANEL WITH GFR
BUN: 21 (ref 4–21)
CO2: 29 — AB (ref 13–22)
Chloride: 98 — AB (ref 99–108)
Creatinine: 1.9 — AB (ref 0.5–1.1)
Glucose: 128
Potassium: 4.3 meq/L (ref 3.5–5.1)
Sodium: 140 (ref 137–147)

## 2024-03-17 LAB — LIPID PANEL
Cholesterol: 243 — AB (ref 0–200)
HDL: 79 — AB (ref 35–70)
LDL Cholesterol: 151
Triglycerides: 77 (ref 40–160)

## 2024-03-17 LAB — IRON,TIBC AND FERRITIN PANEL
%SAT: 23
Ferritin: 314
Iron: 57
TIBC: 250
UIBC: 193

## 2024-03-17 LAB — CBC AND DIFFERENTIAL
HCT: 38 (ref 36–46)
Hemoglobin: 12.5 (ref 12.0–16.0)
Neutrophils Absolute: 1.5
Platelets: 157 10*3/uL (ref 150–400)
WBC: 2.7

## 2024-03-17 LAB — COMPREHENSIVE METABOLIC PANEL WITH GFR
Albumin: 4.6 (ref 3.5–5.0)
Calcium: 9.9 (ref 8.7–10.7)
eGFR: 27

## 2024-03-17 LAB — VITAMIN D 25 HYDROXY (VIT D DEFICIENCY, FRACTURES): Vit D, 25-Hydroxy: 50.9

## 2024-03-17 LAB — POCT GLYCOSYLATED HEMOGLOBIN (HGB A1C): Hemoglobin A1C: 6.5 % — AB (ref 4.0–5.6)

## 2024-03-17 LAB — CBC: RBC: 4.25 (ref 3.87–5.11)

## 2024-03-17 MED ORDER — RYBELSUS 3 MG PO TABS
3.0000 mg | ORAL_TABLET | Freq: Every day | ORAL | 1 refills | Status: DC
  Filled 2024-03-17: qty 90, 90d supply, fill #0

## 2024-03-17 MED ORDER — DONEPEZIL HCL 10 MG PO TBDP
ORAL_TABLET | ORAL | 0 refills | Status: DC
  Filled 2024-03-17: qty 90, 90d supply, fill #0
  Filled 2024-06-11: qty 10, 10d supply, fill #1

## 2024-03-17 NOTE — Progress Notes (Signed)
 Catherine Mcconnell is a 78 y.o. female here for a follow up of a pre-existing problem.  History of Present Illness:   Chief Complaint  Patient presents with   Diabetes       Diabetes 4 month follow-up. Current DM meds: Farxiga  10 mg and Rybelsus  7 mg. Blood sugars at home are: not checked. Patient is compliant with medications. Denies: hypoglycemic or hyperglycemic episodes or symptoms. This patient's diabetes is complicated by HTN and CKD.  Lab Results  Component Value Date   HGBA1C 6.5 (A) 03/17/2024   HTN Currently taking amlodipine  10 mg daily, carvedilol  6.25 mg twice daily, losartan  50 mg daily. At home blood pressure readings are: not checked. Patient denies chest pain, SOB, blurred vision, dizziness, unusual headaches, lower leg swelling. Patient is compliant with medication. Denies excessive caffeine intake, stimulant usage, excessive alcohol intake, or increase in salt consumption.  BP Readings from Last 3 Encounters:  03/17/24 (!) 160/84  11/24/23 116/60  10/27/23 120/62     Past Medical History:  Diagnosis Date   Anemia    on meds   Arthritis    hips/wrists/back   Cataract    sx-bilateral   Chronic kidney disease    stage 3   Diabetes mellitus without complication (HCC)    on meds   Heart murmur    Hypertension    on meds   Iron deficiency    on oral iron supplements, has not required transfusion   Mixed hyperlipidemia    on meds   OSA on CPAP    compliant   Sleep apnea    uses CPAP   Thyroid  nodule    removed-not on meds at this time (12/01/2020)     Social History   Tobacco Use   Smoking status: Former    Current packs/day: 0.00    Average packs/day: 0.3 packs/day for 1 year (0.3 ttl pk-yrs)    Types: Cigarettes    Start date: 23    Quit date: 39    Years since quitting: 65.4   Smokeless tobacco: Never   Tobacco comments:    Smoked briefly in my twenties  Vaping Use   Vaping status: Never Used  Substance Use Topics   Alcohol use:  Not Currently   Drug use: Never    Past Surgical History:  Procedure Laterality Date   ABDOMINAL HYSTERECTOMY  1996   EYE SURGERY     JOINT REPLACEMENT  08/06/21   MASS EXCISION Right 2017   right arm mass removal   THYROID  SURGERY     left side removal benign nodule   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   Procedure: RIGHT TOTAL HIP ARTHROPLASTY ANTERIOR APPROACH;  Surgeon: Wes Hamman, MD;  Location: [facility] OR;  Service: Orthopedics;  Laterality: Right;  3-C    Family History  Problem Relation Age of Onset   Stroke Mother    Hypertension Mother    Heart disease Mother    Heart attack Father    Early death Father    Hypertension Father    Diabetes Sister    Early death Sister    Hypertension Sister    Stroke Sister    Diabetes Sister    Early death Sister    Hyperlipidemia Sister    Kidney disease Sister    Diabetes Sister    Hyperlipidemia Sister    Hypertension Sister    Diabetes Sister    Early death Brother    Arthritis Brother    Heart  attack Brother    Heart disease Brother    Arthritis Brother    Memory loss Paternal Uncle    Depression Daughter    Hypertension Daughter    Hyperlipidemia Daughter    Prostate cancer Son    Cancer Son    Early death Son    Cancer Niece        gyn cancer   Colon polyps Neg Hx    Colon cancer Neg Hx    Esophageal cancer Neg Hx    Rectal cancer Neg Hx    Stomach cancer Neg Hx    Sleep apnea Neg Hx     Allergies  Allergen Reactions   Lisinopril Rash   Metformin  And Related Swelling    Current Medications:   Current Outpatient Medications:    Accu-Chek Softclix Lancets lancets, Use 1 lancet twice daily to test blood glucose levels., Disp: 200 each, Rfl: 1   amLODipine  (NORVASC ) 10 MG tablet, Take 1 tablet (10 mg total) by mouth daily., Disp: 90 tablet, Rfl: 1   aspirin  EC 81 MG tablet, Take 1 tablet (81 mg total) by mouth daily. Swallow whole., Disp: 90 tablet, Rfl: 3   atorvastatin  (LIPITOR) 80 MG tablet, Take  1 tablet by mouth every Monday, Wednesday, and Friday., Disp: 30 tablet, Rfl: 1   bimatoprost  (LUMIGAN ) 0.01 % SOLN, Place 1 drop into both eyes at bedtime., Disp: 7.5 mL, Rfl: 3   Blood Glucose Monitoring Suppl (ONE TOUCH ULTRA MINI) w/Device KIT, Use to check blood sugars twice a day., Disp: 1 kit, Rfl: 0   carvedilol  (COREG ) 6.25 MG tablet, Take 1 tablet (6.25 mg total) by mouth 2 (two) times daily with a meal., Disp: 180 tablet, Rfl: 1   dapagliflozin  propanediol (FARXIGA ) 10 MG TABS tablet, Take 1 tablet (10 mg total) by mouth daily., Disp: 30 tablet, Rfl: 11   donepezil  (ARICEPT  ODT) 10 MG disintegrating tablet, DISSOLVE 1 TABLET ON THE TONGUE  AT BEDTIME, Disp: 100 tablet, Rfl: 0   Evolocumab  (REPATHA  SURECLICK) 140 MG/ML SOAJ, Inject 140 mg into the skin every 14 (fourteen) days., Disp: 2 mL, Rfl: 5   glucose blood (ACCU-CHEK GUIDE TEST) test strip, Use 1 test strip twice daily to test blood glucose levels., Disp: 200 each, Rfl: 1   losartan  (COZAAR ) 50 MG tablet, Take 1 tablet (50 mg total) by mouth daily., Disp: 90 tablet, Rfl: 1   mirtazapine  (REMERON ) 7.5 MG tablet, Take 1 tablet (7.5 mg total) by mouth at bedtime., Disp: 90 tablet, Rfl: 0   nitroGLYCERIN  (NITROSTAT ) 0.4 MG SL tablet, Place 1 tablet (0.4 mg total) under the tongue every 5 (five) minutes as needed for chest pain., Disp: 25 tablet, Rfl: 2   Semaglutide  (RYBELSUS ) 3 MG TABS, Take 1 tablet (3 mg total) by mouth daily., Disp: 90 tablet, Rfl: 1   traMADol  (ULTRAM ) 50 MG tablet, Take 1 tablet (50 mg total) by mouth every 12 (twelve) hours as needed for severe pain., Disp: 30 tablet, Rfl: 1   Review of Systems:   Negative unless otherwise specified per HPI.  Vitals:   Vitals:   03/17/24 0906 03/17/24 0938  BP: (!) 170/86 (!) 160/84  Pulse: 67   Temp: (!) 97.3 F (36.3 C)   TempSrc: Temporal   SpO2: 97%   Weight: 136 lb (61.7 kg)   Height: 5\' 6"  (1.676 m)      Body mass index is 21.95 kg/m.  Physical Exam:    Physical Exam Vitals and nursing note  reviewed.  Constitutional:      General: She is not in acute distress.    Appearance: She is well-developed. She is not ill-appearing or toxic-appearing.  Cardiovascular:     Rate and Rhythm: Normal rate and regular rhythm.     Pulses: Normal pulses.     Heart sounds: Normal heart sounds, S1 normal and S2 normal.  Pulmonary:     Effort: Pulmonary effort is normal.     Breath sounds: Normal breath sounds.  Skin:    General: Skin is warm and dry.  Neurological:     Mental Status: She is alert.     GCS: GCS eye subscore is 4. GCS verbal subscore is 5. GCS motor subscore is 6.  Psychiatric:        Speech: Speech normal.        Behavior: Behavior normal. Behavior is cooperative.    Results for orders placed or performed in visit on 03/17/24  POCT glycosylated hemoglobin (Hb A1C)  Result Value Ref Range   Hemoglobin A1C 6.5 (A) 4.0 - 5.6 %     Assessment and Plan:   Diabetes mellitus without complication (HCC) (Primary); Diabetes mellitus treated with oral medication (HCC) - POCT glycosylated hemoglobin (Hb A1C)  Hemoglobin A1c is well controlled today We are going to decrease her Rybelsus  to 3 mg daily, in effort to help prevent further weight loss Continue Farxiga  10 mg daily as well, per renal Follow up in 91 days due to ongoing weight loss  Essential hypertension Above goal today No evidence of end-organ damage on my exam Recommend patient monitor home blood pressure at least a few times weekly Continue amlodipine  10 mg daily, carvedilol  6.25 mg twice daily, losartan  50 mg daily mg daily If home monitoring shows consistent elevation, or any symptom(s) develop, recommend reach out to us  for further advice on next steps    Alexander Iba, PA-C

## 2024-03-17 NOTE — Patient Instructions (Addendum)
 It was great to see you!  Stop Rybelsus  7 mg; start Rybelsus  3 mg  Please follow up with renal for your blood pressure  Let's follow-up in 3 months to check on weight and follow up on rybelsus , sooner if you have concerns.  Take care,  Alexander Iba PA-C

## 2024-03-18 LAB — LAB REPORT - SCANNED
Albumin, Urine POC: 478.7
Calcium: 9.9
Creatinine, POC: 102.6 mg/dL
EGFR: 27
Microalb Creat Ratio: 467
PTH: 37
TSH: 1.68 (ref 0.41–5.90)

## 2024-03-23 ENCOUNTER — Encounter: Payer: Self-pay | Admitting: Physician Assistant

## 2024-03-24 ENCOUNTER — Other Ambulatory Visit (HOSPITAL_BASED_OUTPATIENT_CLINIC_OR_DEPARTMENT_OTHER): Payer: Self-pay

## 2024-03-26 ENCOUNTER — Encounter: Payer: Self-pay | Admitting: Physician Assistant

## 2024-05-13 ENCOUNTER — Encounter: Payer: Self-pay | Admitting: Physician Assistant

## 2024-05-27 ENCOUNTER — Ambulatory Visit

## 2024-05-27 ENCOUNTER — Ambulatory Visit (INDEPENDENT_AMBULATORY_CARE_PROVIDER_SITE_OTHER): Admitting: Physician Assistant

## 2024-05-27 ENCOUNTER — Ambulatory Visit (INDEPENDENT_AMBULATORY_CARE_PROVIDER_SITE_OTHER)

## 2024-05-27 ENCOUNTER — Encounter: Payer: Self-pay | Admitting: Physician Assistant

## 2024-05-27 VITALS — BP 121/69 | HR 55 | Temp 97.2°F | Ht 66.0 in | Wt 137.0 lb

## 2024-05-27 DIAGNOSIS — M25551 Pain in right hip: Secondary | ICD-10-CM

## 2024-05-27 DIAGNOSIS — M7989 Other specified soft tissue disorders: Secondary | ICD-10-CM | POA: Diagnosis not present

## 2024-05-27 DIAGNOSIS — Z471 Aftercare following joint replacement surgery: Secondary | ICD-10-CM | POA: Diagnosis not present

## 2024-05-27 DIAGNOSIS — Z96641 Presence of right artificial hip joint: Secondary | ICD-10-CM | POA: Diagnosis not present

## 2024-05-27 NOTE — Progress Notes (Signed)
 Catherine Mcconnell is a 78 y.o. female here for a new problem.  History of Present Illness:   Chief Complaint  Patient presents with   Hip Pain    Rt side  Hip Replacement, couple of year ago     Discussed the use of AI scribe software for clinical note transcription with the patient, who gave verbal consent to proceed.  History of Present Illness Catherine Mcconnell is a 78 year old female who presents with hip swelling and pain. She is accompanied by her daughter.  She has swelling on the lateral aspect of her R hip, which is tender to touch but not painful unless pressed. She has taken Tylenol  for discomfort, most recently last night. Her daughter notes that she sometimes refrains from doing chores like dishes due to hip pain.  She maintains her daily routine, including walking from the back door to the mailbox and doing laps around the backyard, indicating no significant decrease in mobility. She underwent hip surgery on August 06, 2021. There is no history of osteoporosis.  She sustained a fall last month and symptom(s) began shortly after.    Past Medical History:  Diagnosis Date   Anemia    on meds   Arthritis    hips/wrists/back   Cataract    sx-bilateral   Chronic kidney disease    stage 3   Diabetes mellitus without complication (HCC)    on meds   Heart murmur    Hypertension    on meds   Iron deficiency    on oral iron supplements, has not required transfusion   Mixed hyperlipidemia    on meds   OSA on CPAP    compliant   Sleep apnea    uses CPAP   Thyroid  nodule    removed-not on meds at this time (12/01/2020)     Social History   Tobacco Use   Smoking status: Former    Current packs/day: 0.00    Average packs/day: 0.3 packs/day for 1 year (0.3 ttl pk-yrs)    Types: Cigarettes    Start date: 52    Quit date: 77    Years since quitting: 65.6   Smokeless tobacco: Never   Tobacco comments:    Smoked briefly in my twenties  Vaping Use   Vaping  status: Never Used  Substance Use Topics   Alcohol use: Not Currently   Drug use: Never    Past Surgical History:  Procedure Laterality Date   ABDOMINAL HYSTERECTOMY  1996   EYE SURGERY     JOINT REPLACEMENT  08/06/21   MASS EXCISION Right 2017   right arm mass removal   THYROID  SURGERY     left side removal benign nodule   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   Procedure: RIGHT TOTAL HIP ARTHROPLASTY ANTERIOR APPROACH;  Surgeon: Jerri Kay HERO, MD;  Location: [facility] OR;  Service: Orthopedics;  Laterality: Right;  3-C    Family History  Problem Relation Age of Onset   Stroke Mother    Hypertension Mother    Heart disease Mother    Heart attack Father    Early death Father    Hypertension Father    Diabetes Sister    Early death Sister    Hypertension Sister    Stroke Sister    Diabetes Sister    Early death Sister    Hyperlipidemia Sister    Kidney disease Sister    Diabetes Sister    Hyperlipidemia Sister  Hypertension Sister    Diabetes Sister    Early death Brother    Arthritis Brother    Heart attack Brother    Heart disease Brother    Arthritis Brother    Memory loss Paternal Uncle    Depression Daughter    Hypertension Daughter    Hyperlipidemia Daughter    Prostate cancer Son    Cancer Son    Early death Son    Cancer Niece        gyn cancer   Colon polyps Neg Hx    Colon cancer Neg Hx    Esophageal cancer Neg Hx    Rectal cancer Neg Hx    Stomach cancer Neg Hx    Sleep apnea Neg Hx     Allergies  Allergen Reactions   Lisinopril Rash   Metformin  And Related Swelling    Current Medications:   Current Outpatient Medications:    Accu-Chek Softclix Lancets lancets, Use 1 lancet twice daily to test blood glucose levels., Disp: 200 each, Rfl: 1   amLODipine  (NORVASC ) 10 MG tablet, Take 1 tablet (10 mg total) by mouth daily., Disp: 90 tablet, Rfl: 1   aspirin  EC 81 MG tablet, Take 1 tablet (81 mg total) by mouth daily. Swallow whole., Disp: 90  tablet, Rfl: 3   atorvastatin  (LIPITOR) 80 MG tablet, Take 1 tablet by mouth every Monday, Wednesday, and Friday., Disp: 30 tablet, Rfl: 1   bimatoprost  (LUMIGAN ) 0.01 % SOLN, Place 1 drop into both eyes at bedtime., Disp: 7.5 mL, Rfl: 3   Blood Glucose Monitoring Suppl (ONE TOUCH ULTRA MINI) w/Device KIT, Use to check blood sugars twice a day., Disp: 1 kit, Rfl: 0   carvedilol  (COREG ) 6.25 MG tablet, Take 1 tablet (6.25 mg total) by mouth 2 (two) times daily with a meal., Disp: 180 tablet, Rfl: 1   dapagliflozin  propanediol (FARXIGA ) 10 MG TABS tablet, Take 1 tablet (10 mg total) by mouth daily., Disp: 30 tablet, Rfl: 11   donepezil  (ARICEPT  ODT) 10 MG disintegrating tablet, DISSOLVE 1 TABLET ON THE TONGUE  AT BEDTIME, Disp: 100 tablet, Rfl: 0   Evolocumab  (REPATHA  SURECLICK) 140 MG/ML SOAJ, Inject 140 mg into the skin every 14 (fourteen) days., Disp: 2 mL, Rfl: 5   glucose blood (ACCU-CHEK GUIDE TEST) test strip, Use 1 test strip twice daily to test blood glucose levels., Disp: 200 each, Rfl: 1   losartan  (COZAAR ) 50 MG tablet, Take 1 tablet (50 mg total) by mouth daily., Disp: 90 tablet, Rfl: 1   mirtazapine  (REMERON ) 7.5 MG tablet, Take 1 tablet (7.5 mg total) by mouth at bedtime., Disp: 90 tablet, Rfl: 0   nitroGLYCERIN  (NITROSTAT ) 0.4 MG SL tablet, Place 1 tablet (0.4 mg total) under the tongue every 5 (five) minutes as needed for chest pain., Disp: 25 tablet, Rfl: 2   Semaglutide  (RYBELSUS ) 3 MG TABS, Take 1 tablet (3 mg total) by mouth daily., Disp: 90 tablet, Rfl: 1   traMADol  (ULTRAM ) 50 MG tablet, Take 1 tablet (50 mg total) by mouth every 12 (twelve) hours as needed for severe pain., Disp: 30 tablet, Rfl: 1   Review of Systems:   Negative unless otherwise specified per HPI.  Vitals:   Vitals:   05/27/24 0830  BP: 121/69  Pulse: (!) 55  Temp: (!) 97.2 F (36.2 C)  TempSrc: Temporal  SpO2: 100%  Weight: 137 lb (62.1 kg)  Height: 5' 6 (1.676 m)     Body mass index is 22.11  kg/m.  Physical Exam:   Physical Exam Constitutional:      Appearance: Normal appearance. She is well-developed.  HENT:     Head: Normocephalic and atraumatic.  Eyes:     General: Lids are normal.     Extraocular Movements: Extraocular movements intact.     Conjunctiva/sclera: Conjunctivae normal.  Pulmonary:     Effort: Pulmonary effort is normal.  Musculoskeletal:        General: Normal range of motion.     Cervical back: Normal range of motion and neck supple.     Comments: Tenderness to palpation to right lateral hip Normal range of motion of hip; normal gait No tenderness to palpation to spine   Skin:    General: Skin is warm and dry.  Neurological:     Mental Status: She is alert and oriented to person, place, and time.  Psychiatric:        Attention and Perception: Attention and perception normal.        Mood and Affect: Mood normal.        Behavior: Behavior normal.        Thought Content: Thought content normal.        Judgment: Judgment normal.     Assessment and Plan:    Assessment & Plan Right hip pain and swelling after fall with prior right hip surgery Right hip swelling and tenderness at the scar site from previous surgery. No significant pain on examination.  - Order right hip x-ray. - Consider sports medicine referral if x-ray findings are concerning. - Encouraged her to vocalize pain for further investigation. - reports there is no need for prescription for pain - consider physical therapy      Lucie Buttner, PA-C

## 2024-06-03 ENCOUNTER — Other Ambulatory Visit (HOSPITAL_BASED_OUTPATIENT_CLINIC_OR_DEPARTMENT_OTHER): Payer: Self-pay

## 2024-06-03 ENCOUNTER — Ambulatory Visit: Payer: Self-pay | Admitting: Physician Assistant

## 2024-06-12 ENCOUNTER — Other Ambulatory Visit (HOSPITAL_BASED_OUTPATIENT_CLINIC_OR_DEPARTMENT_OTHER): Payer: Self-pay

## 2024-06-12 ENCOUNTER — Other Ambulatory Visit: Payer: Self-pay | Admitting: Neurology

## 2024-06-16 ENCOUNTER — Other Ambulatory Visit: Payer: Self-pay | Admitting: Physician Assistant

## 2024-06-16 ENCOUNTER — Other Ambulatory Visit (HOSPITAL_BASED_OUTPATIENT_CLINIC_OR_DEPARTMENT_OTHER): Payer: Self-pay

## 2024-06-17 ENCOUNTER — Other Ambulatory Visit: Payer: Self-pay

## 2024-06-17 ENCOUNTER — Ambulatory Visit: Admitting: Physician Assistant

## 2024-06-17 ENCOUNTER — Other Ambulatory Visit (HOSPITAL_BASED_OUTPATIENT_CLINIC_OR_DEPARTMENT_OTHER): Payer: Self-pay

## 2024-06-17 ENCOUNTER — Encounter: Payer: Self-pay | Admitting: Nurse Practitioner

## 2024-06-17 VITALS — BP 130/70 | HR 55 | Temp 98.6°F | Ht 66.0 in | Wt 136.4 lb

## 2024-06-17 DIAGNOSIS — Z7984 Long term (current) use of oral hypoglycemic drugs: Secondary | ICD-10-CM

## 2024-06-17 DIAGNOSIS — M25551 Pain in right hip: Secondary | ICD-10-CM | POA: Diagnosis not present

## 2024-06-17 DIAGNOSIS — E782 Mixed hyperlipidemia: Secondary | ICD-10-CM | POA: Diagnosis not present

## 2024-06-17 DIAGNOSIS — E538 Deficiency of other specified B group vitamins: Secondary | ICD-10-CM | POA: Diagnosis not present

## 2024-06-17 DIAGNOSIS — E559 Vitamin D deficiency, unspecified: Secondary | ICD-10-CM | POA: Diagnosis not present

## 2024-06-17 DIAGNOSIS — R630 Anorexia: Secondary | ICD-10-CM

## 2024-06-17 DIAGNOSIS — E119 Type 2 diabetes mellitus without complications: Secondary | ICD-10-CM

## 2024-06-17 DIAGNOSIS — N1832 Chronic kidney disease, stage 3b: Secondary | ICD-10-CM | POA: Diagnosis not present

## 2024-06-17 LAB — CBC WITH DIFFERENTIAL/PLATELET
Basophils Absolute: 0 K/uL (ref 0.0–0.1)
Basophils Relative: 1.6 % (ref 0.0–3.0)
Eosinophils Absolute: 0.1 K/uL (ref 0.0–0.7)
Eosinophils Relative: 5.5 % — ABNORMAL HIGH (ref 0.0–5.0)
HCT: 34.7 % — ABNORMAL LOW (ref 36.0–46.0)
Hemoglobin: 11.6 g/dL — ABNORMAL LOW (ref 12.0–15.0)
Lymphocytes Relative: 36.8 % (ref 12.0–46.0)
Lymphs Abs: 0.9 K/uL (ref 0.7–4.0)
MCHC: 33.5 g/dL (ref 30.0–36.0)
MCV: 87.5 fl (ref 78.0–100.0)
Monocytes Absolute: 0.3 K/uL (ref 0.1–1.0)
Monocytes Relative: 10.1 % (ref 3.0–12.0)
Neutro Abs: 1.2 K/uL — ABNORMAL LOW (ref 1.4–7.7)
Neutrophils Relative %: 46 % (ref 43.0–77.0)
Platelets: 162 K/uL (ref 150.0–400.0)
RBC: 3.97 Mil/uL (ref 3.87–5.11)
RDW: 13.4 % (ref 11.5–15.5)
WBC: 2.5 K/uL — ABNORMAL LOW (ref 4.0–10.5)

## 2024-06-17 LAB — COMPREHENSIVE METABOLIC PANEL WITH GFR
ALT: 17 U/L (ref 0–35)
AST: 19 U/L (ref 0–37)
Albumin: 4.1 g/dL (ref 3.5–5.2)
Alkaline Phosphatase: 43 U/L (ref 39–117)
BUN: 29 mg/dL — ABNORMAL HIGH (ref 6–23)
CO2: 29 meq/L (ref 19–32)
Calcium: 9.3 mg/dL (ref 8.4–10.5)
Chloride: 103 meq/L (ref 96–112)
Creatinine, Ser: 1.78 mg/dL — ABNORMAL HIGH (ref 0.40–1.20)
GFR: 27.12 mL/min — ABNORMAL LOW (ref >60.00)
Glucose, Bld: 143 mg/dL — ABNORMAL HIGH (ref 70–99)
Potassium: 4.2 meq/L (ref 3.5–5.1)
Sodium: 139 meq/L (ref 135–145)
Total Bilirubin: 0.5 mg/dL (ref 0.2–1.2)
Total Protein: 6.7 g/dL (ref 6.0–8.3)

## 2024-06-17 LAB — LIPID PANEL
Cholesterol: 178 mg/dL (ref 0–200)
HDL: 60.6 mg/dL (ref >39.00)
LDL Cholesterol: 91 mg/dL (ref 0–99)
NonHDL: 117.76
Total CHOL/HDL Ratio: 3
Triglycerides: 135 mg/dL (ref 0.0–149.0)
VLDL: 27 mg/dL (ref 0.0–40.0)

## 2024-06-17 LAB — HEMOGLOBIN A1C: Hgb A1c MFr Bld: 7.8 % — ABNORMAL HIGH (ref 4.6–6.5)

## 2024-06-17 LAB — VITAMIN B12: Vitamin B-12: 459 pg/mL (ref 211–911)

## 2024-06-17 MED ORDER — MIRTAZAPINE 7.5 MG PO TABS
7.5000 mg | ORAL_TABLET | Freq: Every day | ORAL | 1 refills | Status: DC
  Filled 2024-06-21: qty 90, 90d supply, fill #0
  Filled 2024-09-16: qty 90, 90d supply, fill #1

## 2024-06-17 MED ORDER — TRAMADOL HCL 50 MG PO TABS
50.0000 mg | ORAL_TABLET | Freq: Two times a day (BID) | ORAL | 1 refills | Status: AC | PRN
  Filled 2024-06-17: qty 30, 15d supply, fill #0

## 2024-06-17 MED ORDER — MIRTAZAPINE 7.5 MG PO TABS
7.5000 mg | ORAL_TABLET | Freq: Every day | ORAL | 0 refills | Status: DC
  Filled 2024-06-17: qty 90, 90d supply, fill #0

## 2024-06-17 MED ORDER — RYBELSUS 3 MG PO TABS
3.0000 mg | ORAL_TABLET | Freq: Every day | ORAL | 1 refills | Status: AC
  Filled 2024-06-17: qty 90, 90d supply, fill #0
  Filled 2024-09-16: qty 90, 90d supply, fill #1

## 2024-06-17 MED FILL — Lancets: 90 days supply | Qty: 200 | Fill #0 | Status: AC

## 2024-06-17 NOTE — Progress Notes (Signed)
 Catherine Mcconnell is a 78 y.o. female here for a follow up of a pre-existing problem.  History of Present Illness:   Chief Complaint  Patient presents with   Follow-up    Patient states right hip swelling and achy where she had her fracture. States it happened last week. Just randomly happens. Starts to get painful when it happens.     Discussed the use of AI scribe software for clinical note transcription with the patient, who gave verbal consent to proceed.  History of Present Illness Catherine Mcconnell is a 78 year old female who presents for medication refills and follow-up.  She experiences intermittent right hip pain with swelling and discomfort during daytime activities, such as walking in the backyard. The pain does not affect her sleep. An x-ray was previously normal, and she has not consulted her hip surgeon since October 2023. She is taking tramadol  50 mg as needed.  DM She is currently taking Rybelsus  3 mg without any issues.   Poor appetite Her appetite is stable with the aid of Remeron  7.5 mg daily. Weight has been overall stable.  B12 deficiency She has history of B12 deficiency and was seen by hematology in the past, she has history of B12 injections and we are monitoring her B12    Past Medical History:  Diagnosis Date   Anemia    on meds   Arthritis    hips/wrists/back   Cataract    sx-bilateral   Chronic kidney disease    stage 3   Diabetes mellitus without complication (HCC)    on meds   Heart murmur    Hypertension    on meds   Iron deficiency    on oral iron supplements, has not required transfusion   Mixed hyperlipidemia    on meds   OSA on CPAP    compliant   Sleep apnea    uses CPAP   Thyroid  nodule    removed-not on meds at this time (12/01/2020)     Social History   Tobacco Use   Smoking status: Former    Current packs/day: 0.00    Average packs/day: 0.3 packs/day for 1 year (0.3 ttl pk-yrs)    Types: Cigarettes    Start date: 73     Quit date: 1    Years since quitting: 65.7   Smokeless tobacco: Never   Tobacco comments:    Smoked briefly in my twenties  Vaping Use   Vaping status: Never Used  Substance Use Topics   Alcohol use: Not Currently   Drug use: Never    Past Surgical History:  Procedure Laterality Date   ABDOMINAL HYSTERECTOMY  1996   EYE SURGERY     JOINT REPLACEMENT  08/06/21   MASS EXCISION Right 2017   right arm mass removal   THYROID  SURGERY     left side removal benign nodule   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   Procedure: RIGHT TOTAL HIP ARTHROPLASTY ANTERIOR APPROACH;  Surgeon: Jerri Kay HERO, MD;  Location: [facility] OR;  Service: Orthopedics;  Laterality: Right;  3-C    Family History  Problem Relation Age of Onset   Stroke Mother    Hypertension Mother    Heart disease Mother    Heart attack Father    Early death Father    Hypertension Father    Diabetes Sister    Early death Sister    Hypertension Sister    Stroke Sister    Diabetes Sister  Early death Sister    Hyperlipidemia Sister    Kidney disease Sister    Diabetes Sister    Hyperlipidemia Sister    Hypertension Sister    Diabetes Sister    Early death Brother    Arthritis Brother    Heart attack Brother    Heart disease Brother    Arthritis Brother    Memory loss Paternal Uncle    Depression Daughter    Hypertension Daughter    Hyperlipidemia Daughter    Prostate cancer Son    Cancer Son    Early death Son    Cancer Niece        gyn cancer   Colon polyps Neg Hx    Colon cancer Neg Hx    Esophageal cancer Neg Hx    Rectal cancer Neg Hx    Stomach cancer Neg Hx    Sleep apnea Neg Hx     Allergies  Allergen Reactions   Lisinopril Rash   Metformin  And Related Swelling    Current Medications:   Current Outpatient Medications:    Accu-Chek Softclix Lancets lancets, Use 1 lancet twice daily to test blood glucose levels., Disp: 200 each, Rfl: 1   amLODipine  (NORVASC ) 10 MG tablet, Take 1 tablet  (10 mg total) by mouth daily., Disp: 90 tablet, Rfl: 1   aspirin  EC 81 MG tablet, Take 1 tablet (81 mg total) by mouth daily. Swallow whole., Disp: 90 tablet, Rfl: 3   atorvastatin  (LIPITOR) 80 MG tablet, Take 1 tablet by mouth every Monday, Wednesday, and Friday., Disp: 30 tablet, Rfl: 1   bimatoprost  (LUMIGAN ) 0.01 % SOLN, Place 1 drop into both eyes at bedtime., Disp: 7.5 mL, Rfl: 3   Blood Glucose Monitoring Suppl (ONE TOUCH ULTRA MINI) w/Device KIT, Use to check blood sugars twice a day., Disp: 1 kit, Rfl: 0   carvedilol  (COREG ) 6.25 MG tablet, Take 1 tablet (6.25 mg total) by mouth 2 (two) times daily with a meal., Disp: 180 tablet, Rfl: 1   dapagliflozin  propanediol (FARXIGA ) 10 MG TABS tablet, Take 1 tablet (10 mg total) by mouth daily., Disp: 30 tablet, Rfl: 11   donepezil  (ARICEPT  ODT) 10 MG disintegrating tablet, DISSOLVE 1 TABLET ON THE TONGUE  AT BEDTIME, Disp: 100 tablet, Rfl: 0   Evolocumab  (REPATHA  SURECLICK) 140 MG/ML SOAJ, Inject 140 mg into the skin every 14 (fourteen) days., Disp: 2 mL, Rfl: 5   glucose blood (ACCU-CHEK GUIDE TEST) test strip, Use 1 test strip twice daily to test blood glucose levels., Disp: 200 each, Rfl: 1   losartan  (COZAAR ) 50 MG tablet, Take 1 tablet (50 mg total) by mouth daily., Disp: 90 tablet, Rfl: 1   mirtazapine  (REMERON ) 7.5 MG tablet, Take 1 tablet (7.5 mg total) by mouth at bedtime., Disp: 90 tablet, Rfl: 0   nitroGLYCERIN  (NITROSTAT ) 0.4 MG SL tablet, Place 1 tablet (0.4 mg total) under the tongue every 5 (five) minutes as needed for chest pain., Disp: 25 tablet, Rfl: 2   Semaglutide  (RYBELSUS ) 3 MG TABS, Take 1 tablet (3 mg total) by mouth daily., Disp: 90 tablet, Rfl: 1   traMADol  (ULTRAM ) 50 MG tablet, Take 1 tablet (50 mg total) by mouth every 12 (twelve) hours as needed for severe pain., Disp: 30 tablet, Rfl: 1   Review of Systems:   Negative unless otherwise specified per HPI.  Vitals:   Vitals:   06/17/24 1101  BP: 130/70  Pulse: (!)  55  Temp: 98.6 F (37 C)  TempSrc: Oral  SpO2: 98%  Weight: 136 lb 6.4 oz (61.9 kg)  Height: 5' 6 (1.676 m)     Body mass index is 22.02 kg/m.  Physical Exam:   Physical Exam Constitutional:      Appearance: Normal appearance. She is well-developed.  HENT:     Head: Normocephalic and atraumatic.  Eyes:     General: Lids are normal.     Extraocular Movements: Extraocular movements intact.     Conjunctiva/sclera: Conjunctivae normal.  Pulmonary:     Effort: Pulmonary effort is normal.  Musculoskeletal:        General: Normal range of motion.     Cervical back: Normal range of motion and neck supple.  Skin:    General: Skin is warm and dry.  Neurological:     Mental Status: She is alert and oriented to person, place, and time.  Psychiatric:        Attention and Perception: Attention and perception normal.        Mood and Affect: Mood normal.        Behavior: Behavior normal.        Thought Content: Thought content normal.        Judgment: Judgment normal.     Assessment and Plan:   Assessment and Plan Assessment & Plan Right hip pain  Intermittent pain and swelling, activity-related. X-ray normal. - Refer to Dr. Jerri for orthopedic evaluation.  Type 2 diabetes mellitus;  Blood sugars stable on 3 mg Rybelsus . - Order blood work including A1c and adjust medication as needed  Stage 3b Chronic kidney disease Kidney function well-managed, under nephrologist care. Continues on Farxiga  10 mg daily  Vitamin D  deficiency -Update and provide recommendations   Vitamin B12 deficiency Monitoring B12 levels, taking oral supplements. - Order blood work including B12 levels.  Mixed HLD  - Continue close follow up with cardiology for ongoing cholesterol management   Poor appetite On Remeron  7.5 mg medication for sleep and appetite, reports good sleep quality.     Lucie Buttner, PA-C

## 2024-06-17 NOTE — Patient Instructions (Addendum)
  VISIT SUMMARY: Today, we discussed your medication refills and followed up on several health issues, including your right hip pain, diabetes, hypertension, chronic kidney disease, obesity, vitamin B12 deficiency, and insomnia.  YOUR PLAN: RIGHT HIP PAIN AND SWELLING: You have intermittent pain and swelling in your right hip, especially during activities like walking. Your previous x-ray was normal. -You will be referred to Dr. Jerri for an orthopedic evaluation.  TYPE 2 DIABETES MELLITUS: Your blood sugar levels are stable with your current medication, Rybelsus  3 mg. -We will order blood work, including an A1c test, to monitor your diabetes.  HYPERTENSION: Your blood pressure is well-controlled. -Continue with your current management plan.  CHRONIC KIDNEY DISEASE: Your kidney function is well-managed and you are under the care of a nephrologist. -Continue with your current management plan.  OBESITY: Your weight has been stable within a two-pound range since June. -Continue with your current management plan.  VITAMIN B12 DEFICIENCY: You are taking oral B12 supplements and your levels are being monitored. -We will order blood work to check your B12 levels.  INSOMNIA: You are on medication for sleep and appetite and report good sleep quality. -Continue with your current management plan.    Please get flu shot by next month.                  Contains text generated by [facility].                                 Contains text generated by [employer].

## 2024-06-18 ENCOUNTER — Ambulatory Visit: Payer: Self-pay | Admitting: Physician Assistant

## 2024-06-21 ENCOUNTER — Other Ambulatory Visit: Payer: Self-pay

## 2024-06-21 ENCOUNTER — Other Ambulatory Visit (HOSPITAL_BASED_OUTPATIENT_CLINIC_OR_DEPARTMENT_OTHER): Payer: Self-pay

## 2024-06-21 ENCOUNTER — Encounter: Payer: Self-pay | Admitting: Nurse Practitioner

## 2024-06-21 MED ORDER — DONEPEZIL HCL 10 MG PO TBDP
10.0000 mg | ORAL_TABLET | Freq: Every day | ORAL | 0 refills | Status: DC
  Filled 2024-06-21: qty 90, 90d supply, fill #0
  Filled 2024-10-18: qty 90, 90d supply, fill #1
  Filled ????-??-??: fill #1

## 2024-06-22 ENCOUNTER — Other Ambulatory Visit (HOSPITAL_BASED_OUTPATIENT_CLINIC_OR_DEPARTMENT_OTHER): Payer: Self-pay

## 2024-06-23 ENCOUNTER — Telehealth: Payer: Self-pay | Admitting: Nurse Practitioner

## 2024-06-25 ENCOUNTER — Other Ambulatory Visit: Payer: Self-pay

## 2024-06-25 DIAGNOSIS — D709 Neutropenia, unspecified: Secondary | ICD-10-CM

## 2024-06-25 NOTE — Progress Notes (Signed)
 [facility]   [phone] [phone]    Patient Care Team: Job Lukes, [place] as PCP - General (Physician Assistant) Lonni Slain, MD as PCP - Cardiology (Cardiology) Elmira Newman PARAS, MD as Consulting Physician (Cardiology) Gearline Norris, MD as Consulting Physician (Nephrology) Georgina Speaks, FNP (General Practice) Dohmeier, Dedra, MD as Consulting Physician (Neurology) [facility] as Consulting Physician  Date of service: 06/28/2024  CHIEF COMPLAINT: Follow up leukopenia   CURRENT THERAPY: B12 supplementation   INTERVAL HISTORY Ms. Stivers returns for follow up, last seen by Dr. Lanny 04/30/23. She returns to review recent labs drawn on 06/17/24 showing slight drop in ANC to 1.2.  Denies recent fever/chills or known infection.  She had been taking oral B12 daily until November when the level was elevated and she dropped to once a week.  Weight is stable, denies night sweats, adenopathy, bleeding, new pain, or any other new or specific complaints.  ROS  All other systems reviewed and negative  Past Medical History:  Diagnosis Date   Anemia    on meds   Arthritis    hips/wrists/back   Cataract    sx-bilateral   Chronic kidney disease    stage 3   Diabetes mellitus without complication (HCC)    on meds   Heart murmur    Hypertension    on meds   Iron deficiency    on oral iron supplements, has not required transfusion   Mixed hyperlipidemia    on meds   OSA on CPAP    compliant   Sleep apnea    uses CPAP   Thyroid  nodule    removed-not on meds at this time (12/01/2020)     Past Surgical History:  Procedure Laterality Date   ABDOMINAL HYSTERECTOMY  1996   EYE SURGERY     JOINT REPLACEMENT  08/06/21   MASS EXCISION Right 2017   right arm mass removal   THYROID  SURGERY     left side removal benign nodule   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   Procedure: RIGHT TOTAL HIP ARTHROPLASTY  ANTERIOR APPROACH;  Surgeon: Jerri Kay HERO, MD;  Location: [facility] OR;  Service: Orthopedics;  Laterality: Right;  3-C     Outpatient Encounter Medications as of 06/28/2024  Medication Sig   Accu-Chek Softclix Lancets lancets Use 1 lancet twice daily to test blood glucose levels.   amLODipine  (NORVASC ) 10 MG tablet Take 1 tablet (10 mg total) by mouth daily.   aspirin  EC 81 MG tablet Take 1 tablet (81 mg total) by mouth daily. Swallow whole.   atorvastatin  (LIPITOR) 80 MG tablet Take 1 tablet by mouth every Monday, Wednesday, and Friday.   Blood Glucose Monitoring Suppl (ONE TOUCH ULTRA MINI) w/Device KIT Use to check blood sugars twice a day.   carvedilol  (COREG ) 6.25 MG tablet Take 1 tablet (6.25 mg total) by mouth 2 (two) times daily with a meal.   dapagliflozin  propanediol (FARXIGA ) 10 MG TABS tablet Take 1 tablet (10 mg total) by mouth daily.   donepezil  (ARICEPT  ODT) 10 MG disintegrating tablet Place 1 tablet (10 mg total) and let dissolve on tongue at bedtime.   Evolocumab  (REPATHA  SURECLICK) 140 MG/ML SOAJ Inject 140 mg into the skin every 14 (fourteen) days.   glucose blood (ACCU-CHEK GUIDE TEST) test strip Use 1 test strip twice daily to test blood glucose levels.   losartan  (COZAAR ) 50 MG tablet Take 1 tablet (50 mg total) by mouth  daily.   mirtazapine  (REMERON ) 7.5 MG tablet Take 1 tablet (7.5 mg total) by mouth at bedtime.   nitroGLYCERIN  (NITROSTAT ) 0.4 MG SL tablet Place 1 tablet (0.4 mg total) under the tongue every 5 (five) minutes as needed for chest pain.   Semaglutide  (RYBELSUS ) 3 MG TABS Take 1 tablet (3 mg total) by mouth daily.   traMADol  (ULTRAM ) 50 MG tablet Take 1 tablet (50 mg total) by mouth every 12 (twelve) hours as needed for severe pain (pain score 7-10).   [DISCONTINUED] bimatoprost  (LUMIGAN ) 0.01 % SOLN Place 1 drop into both eyes at bedtime.   No facility-administered encounter medications on file as of 06/28/2024.     Today's Vitals   06/28/24 0842  BP: (!)  126/58  Pulse: (!) 53  Resp: 17  Temp: 97.8 F (36.6 C)  SpO2: 96%  Weight: 138 lb 8 oz (62.8 kg)   Body mass index is 22.35 kg/m.   ECOG PERFORMANCE STATUS: 0 - Asymptomatic  PHYSICAL EXAM GENERAL:alert, no distress and comfortable SKIN: no rash  EYES: sclera clear NECK: without mass LYMPH:  no palpable cervical or supraclavicular lymphadenopathy  LUNGS: clear with normal breathing effort HEART: regular rate & rhythm, trace ankle edema ABDOMEN: abdomen soft, non-tender and normal bowel sounds NEURO: alert & oriented x 3 with fluent speech, no focal motor/sensory deficits   CBC    Latest Ref Rng & Units 06/28/2024    8:22 AM 06/17/2024   11:52 AM 03/17/2024   12:00 AM  CBC  WBC 4.0 - 10.5 K/uL 2.5  2.5  2.7      Hemoglobin 12.0 - 15.0 g/dL 88.3  88.3  87.4      Hematocrit 36.0 - 46.0 % 34.4  34.7  38      Platelets 150 - 400 K/uL 163  162.0  157         This result is from an external source.      CMP     Latest Ref Rng & Units 06/17/2024   11:52 AM 03/17/2024   12:00 AM 11/24/2023   10:37 AM  CMP  Glucose 70 - 99 mg/dL 856   871   BUN 6 - 23 mg/dL 29  21     16    Creatinine 0.40 - 1.20 mg/dL 8.21  1.9     8.38   Sodium 135 - 145 mEq/L 139  140     138   Potassium 3.5 - 5.1 mEq/L 4.2  4.3     3.8   Chloride 96 - 112 mEq/L 103  98     103   CO2 19 - 32 mEq/L 29  29     28    Calcium  8.4 - 10.5 mg/dL 9.3  9.9       9.9     9.1   Total Protein 6.0 - 8.3 g/dL 6.7     Total Bilirubin 0.2 - 1.2 mg/dL 0.5     Alkaline Phos 39 - 117 U/L 43     AST 0 - 37 U/L 19     ALT 0 - 35 U/L 17        This result is from an external source.      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 No recent med changes. HCV and B12 normal in the past. 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 Pry appears stable. Exam is benign. I reviewed her labs, which show stable mild anemia but worsening neutropenia. No recent infection. She reduced oral B12 from daily to weekly this year. I recommend increase back to at least 5 days per week -If ANC does not improve, may need to r/o bone marrow conditions. Patient and her daughter agree -Lab in 4 weeks with phone f/up few days later.    Normocytic anemia, secondary to CKD -CBC normal in 2012, she has had mild intermittent anemia since at least 2019, Hgb 11-13 range -Has been on oral iron for many years, recently decreased from BID to once daily  -Recent ferritin 305, possibly possibly from iron replacement vs anemia of chronic disease -stable, does not need ESA at this time         PLAN: Labs reviewed  Increase oral B12 to 5x/week Infection precautions, recommend vaccines, etc Lab 4 weeks at [facility], phone f/up few days later      All questions were answered. The patient knows to call the clinic with any problems, questions or concerns. No barriers to learning were detected.   Jeremian Whitby K Colleena Kurtenbach, NP 07/06/2024

## 2024-06-28 ENCOUNTER — Inpatient Hospital Stay: Attending: Nurse Practitioner

## 2024-06-28 ENCOUNTER — Other Ambulatory Visit (HOSPITAL_BASED_OUTPATIENT_CLINIC_OR_DEPARTMENT_OTHER): Payer: Self-pay

## 2024-06-28 ENCOUNTER — Inpatient Hospital Stay (HOSPITAL_BASED_OUTPATIENT_CLINIC_OR_DEPARTMENT_OTHER): Admitting: Nurse Practitioner

## 2024-06-28 VITALS — BP 126/58 | HR 53 | Temp 97.8°F | Resp 17 | Wt 138.5 lb

## 2024-06-28 DIAGNOSIS — N189 Chronic kidney disease, unspecified: Secondary | ICD-10-CM | POA: Diagnosis not present

## 2024-06-28 DIAGNOSIS — D631 Anemia in chronic kidney disease: Secondary | ICD-10-CM | POA: Insufficient documentation

## 2024-06-28 DIAGNOSIS — E538 Deficiency of other specified B group vitamins: Secondary | ICD-10-CM | POA: Diagnosis not present

## 2024-06-28 DIAGNOSIS — D709 Neutropenia, unspecified: Secondary | ICD-10-CM

## 2024-06-28 DIAGNOSIS — I129 Hypertensive chronic kidney disease with stage 1 through stage 4 chronic kidney disease, or unspecified chronic kidney disease: Secondary | ICD-10-CM | POA: Insufficient documentation

## 2024-06-28 LAB — CBC WITH DIFFERENTIAL (CANCER CENTER ONLY)
Abs Immature Granulocytes: 0 K/uL (ref 0.00–0.07)
Basophils Absolute: 0 K/uL (ref 0.0–0.1)
Basophils Relative: 1 %
Eosinophils Absolute: 0.2 K/uL (ref 0.0–0.5)
Eosinophils Relative: 7 %
HCT: 34.4 % — ABNORMAL LOW (ref 36.0–46.0)
Hemoglobin: 11.6 g/dL — ABNORMAL LOW (ref 12.0–15.0)
Immature Granulocytes: 0 %
Lymphocytes Relative: 47 %
Lymphs Abs: 1.2 K/uL (ref 0.7–4.0)
MCH: 29.7 pg (ref 26.0–34.0)
MCHC: 33.7 g/dL (ref 30.0–36.0)
MCV: 88.2 fL (ref 80.0–100.0)
Monocytes Absolute: 0.2 K/uL (ref 0.1–1.0)
Monocytes Relative: 8 %
Neutro Abs: 0.9 K/uL — ABNORMAL LOW (ref 1.7–7.7)
Neutrophils Relative %: 37 %
Platelet Count: 163 K/uL (ref 150–400)
RBC: 3.9 MIL/uL (ref 3.87–5.11)
RDW: 12.6 % (ref 11.5–15.5)
WBC Count: 2.5 K/uL — ABNORMAL LOW (ref 4.0–10.5)
nRBC: 0 % (ref 0.0–0.2)

## 2024-06-28 LAB — VITAMIN B12: Vitamin B-12: 923 pg/mL — ABNORMAL HIGH (ref 180–914)

## 2024-06-29 ENCOUNTER — Ambulatory Visit: Admitting: Orthopaedic Surgery

## 2024-06-29 DIAGNOSIS — Z96641 Presence of right artificial hip joint: Secondary | ICD-10-CM | POA: Diagnosis not present

## 2024-06-29 NOTE — Progress Notes (Signed)
 Office Visit Note   Patient: Catherine Mcconnell           Date of Birth: [birth year]           MRN: [id number] Visit Date: 06/29/2024              Requested by: Job Lukes, [place] [address] [place],  [place] [address] PCP: Job Lukes, [place]   Assessment & Plan: Visit Diagnoses:  1. Status post total replacement of right hip     Plan: History of Present Illness Catherine Mcconnell is a 78 year old female who presents with swelling and intermittent aching in the right hip following a hip replacement in 2022.  Swelling is present on the lateral aspect of the right hip, described as soft and intermittent, with occasional aching but no consistent pain. This has been ongoing since her hip replacement surgery in 2022. She experienced a fall in July, though it is unclear if she landed on the right side.  There is a perceived discrepancy in leg length, with her right leg feeling shorter than the left, leading to an altered walking pattern. She feels a greater descent with the right foot, but this is not associated with specific pain.  An x-ray was performed in August at her primary care office.  Physical Exam MUSCULOSKELETAL: Right hip scar healed with expected soft tissue appearance without evidence of infection. Legs lengths are symmetric.  Results RADIOLOGY Right hip X-ray: Implant intact, no loosening, no abnormalities (05/2024)  Assessment and Plan Status post right total hip arthroplasty with right hip swelling and intermittent pain after prior fall Swelling likely due to trauma from fall or residual post-surgical effects. X-ray shows no implant issues. Pain is intermittent, not severe, indicating no significant abnormality. - Reassured regarding normal healing process post-surgery.  Degenerative lumbar scoliosis Scoliosis identified on X-ray. No leg length discrepancy; scoliosis may cause pelvic tilt and perceived leg length difference.  Follow-Up Instructions: No follow-ups  on file.   Orders:  No orders of the defined types were placed in this encounter.  No orders of the defined types were placed in this encounter.     Procedures: No procedures performed   Clinical Data: No additional findings.   Subjective: Chief Complaint  Patient presents with   Right Hip - Pain    HPI  Review of Systems  Constitutional: Negative.   HENT: Negative.    Eyes: Negative.   Respiratory: Negative.    Cardiovascular: Negative.   Endocrine: Negative.   Musculoskeletal: Negative.   Neurological: Negative.   Hematological: Negative.   Psychiatric/Behavioral: Negative.    All other systems reviewed and are negative.    Objective: Vital Signs: There were no vitals taken for this visit.  Physical Exam Vitals and nursing note reviewed.  Constitutional:      Appearance: She is well-developed.  HENT:     Head: Atraumatic.     Nose: Nose normal.  Eyes:     Extraocular Movements: Extraocular movements intact.  Cardiovascular:     Pulses: Normal pulses.  Pulmonary:     Effort: Pulmonary effort is normal.  Abdominal:     Palpations: Abdomen is soft.  Musculoskeletal:     Cervical back: Neck supple.  Skin:    General: Skin is warm.     Capillary Refill: Capillary refill takes less than 2 seconds.  Neurological:     Mental Status: She is alert. Mental status is at baseline.  Psychiatric:  Behavior: Behavior normal.        Thought Content: Thought content normal.        Judgment: Judgment normal.       Family History  Problem Relation Age of Onset   Stroke Mother    Hypertension Mother    Heart disease Mother    Heart attack Father    Early death Father    Hypertension Father    Diabetes Sister    Early death Sister    Hypertension Sister    Stroke Sister    Diabetes Sister    Early death Sister    Hyperlipidemia Sister    Kidney disease Sister    Diabetes Sister    Hyperlipidemia Sister    Hypertension Sister    Diabetes  Sister    Early death Brother    Arthritis Brother    Heart attack Brother    Heart disease Brother    Arthritis Brother    Memory loss Paternal Uncle    Depression Daughter    Hypertension Daughter    Hyperlipidemia Daughter    Prostate cancer Son    Cancer Son    Early death Son    Cancer Niece        gyn cancer   Colon polyps Neg Hx    Colon cancer Neg Hx    Esophageal cancer Neg Hx    Rectal cancer Neg Hx    Stomach cancer Neg Hx    Sleep apnea Neg Hx     Past Surgical History:  Procedure Laterality Date   ABDOMINAL HYSTERECTOMY  1996   EYE SURGERY     JOINT REPLACEMENT  08/06/21   MASS EXCISION Right 2017   right arm mass removal   THYROID  SURGERY     left side removal benign nodule   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   Procedure: RIGHT TOTAL HIP ARTHROPLASTY ANTERIOR APPROACH;  Surgeon: Jerri Kay HERO, MD;  Location: [facility] OR;  Service: Orthopedics;  Laterality: Right;  3-C   Social History   Occupational History   Occupation: Retired   Tobacco Use   Smoking status: Former    Current packs/day: 0.00    Average packs/day: 0.3 packs/day for 1 year (0.3 ttl pk-yrs)    Types: Cigarettes    Start date: 73    Quit date: 1960    Years since quitting: 65.7   Smokeless tobacco: Never   Tobacco comments:    Smoked briefly in my twenties  Vaping Use   Vaping status: Never Used  Substance and Sexual Activity   Alcohol use: Not Currently   Drug use: Never   Sexual activity: Not Currently    Birth control/protection: Abstinence

## 2024-07-01 ENCOUNTER — Other Ambulatory Visit (HOSPITAL_BASED_OUTPATIENT_CLINIC_OR_DEPARTMENT_OTHER): Payer: Self-pay

## 2024-07-01 MED ORDER — LUMIGAN 0.01 % OP SOLN
1.0000 [drp] | Freq: Every day | OPHTHALMIC | 4 refills | Status: AC
  Filled 2024-07-01: qty 7.5, 75d supply, fill #0
  Filled 2024-09-16: qty 7.5, 75d supply, fill #1

## 2024-07-07 ENCOUNTER — Encounter: Payer: Self-pay | Admitting: Nurse Practitioner

## 2024-07-19 DIAGNOSIS — H40023 Open angle with borderline findings, high risk, bilateral: Secondary | ICD-10-CM | POA: Diagnosis not present

## 2024-07-19 DIAGNOSIS — H40033 Anatomical narrow angle, bilateral: Secondary | ICD-10-CM | POA: Diagnosis not present

## 2024-07-19 DIAGNOSIS — G500 Trigeminal neuralgia: Secondary | ICD-10-CM | POA: Diagnosis not present

## 2024-07-19 DIAGNOSIS — H04123 Dry eye syndrome of bilateral lacrimal glands: Secondary | ICD-10-CM | POA: Diagnosis not present

## 2024-07-19 DIAGNOSIS — E119 Type 2 diabetes mellitus without complications: Secondary | ICD-10-CM | POA: Diagnosis not present

## 2024-07-19 DIAGNOSIS — H2513 Age-related nuclear cataract, bilateral: Secondary | ICD-10-CM | POA: Diagnosis not present

## 2024-07-19 LAB — OPHTHALMOLOGY REPORT-SCANNED

## 2024-07-20 ENCOUNTER — Other Ambulatory Visit (HOSPITAL_BASED_OUTPATIENT_CLINIC_OR_DEPARTMENT_OTHER): Payer: Self-pay

## 2024-07-20 ENCOUNTER — Other Ambulatory Visit: Payer: Self-pay

## 2024-07-20 MED ORDER — PREDNISOLONE ACETATE 1 % OP SUSP
1.0000 [drp] | Freq: Four times a day (QID) | OPHTHALMIC | 1 refills | Status: DC
  Filled 2024-07-20: qty 10, 50d supply, fill #0

## 2024-07-20 MED ORDER — KETOROLAC TROMETHAMINE 0.5 % OP SOLN
1.0000 [drp] | Freq: Four times a day (QID) | OPHTHALMIC | 1 refills | Status: DC
  Filled 2024-07-20: qty 5, 25d supply, fill #0
  Filled 2024-09-07: qty 5, 25d supply, fill #1

## 2024-07-20 MED ORDER — OFLOXACIN 0.3 % OP SOLN
1.0000 [drp] | Freq: Four times a day (QID) | OPHTHALMIC | 1 refills | Status: DC
  Filled 2024-07-20: qty 5, 25d supply, fill #0

## 2024-07-22 ENCOUNTER — Ambulatory Visit: Payer: Self-pay

## 2024-07-22 NOTE — Telephone Encounter (Addendum)
 Contacted patient @T [phone]. Spoke with patient's daughter. Went over provider's comments from below. Daughter verbalized understanding and confirmed lab and in-person office visit at [facility] location.   ----- Message from Mayme MARLA Silversmith sent at 07/06/2024 11:53 PM EDT ----- Regarding: RE: B12 Lab Results It's ok to continue oral B12, the level is high-normal but not dangerous. But low ANC is not due to B12 deficiency in this case, not sure why it dropped. Please keep lab 10/13 and phone visit as  scheduled. We'll go from there.   Thanks Lacie NP ----- Message ----- From: Seena Anders SQUIBB, RN Sent: 07/01/2024   1:24 PM EDT To: Norleen KANDICE Spain, CMA; Lacie K Burton, NP; Kian# Subject: B12 Lab Results                                Lacie,  Please see pt's B12 lab results below.  Does she need to hold the B12 or is it OK for her B12 to be too high?  Please advise what you would like to do.  Thanks,  Anders ----- Message ----- From: Rebecka, Lab In [place] Sent: 06/28/2024   8:38 AM EDT To: Onita Mattock, MD

## 2024-07-26 ENCOUNTER — Inpatient Hospital Stay: Attending: Nurse Practitioner

## 2024-07-26 DIAGNOSIS — D631 Anemia in chronic kidney disease: Secondary | ICD-10-CM | POA: Insufficient documentation

## 2024-07-26 DIAGNOSIS — I129 Hypertensive chronic kidney disease with stage 1 through stage 4 chronic kidney disease, or unspecified chronic kidney disease: Secondary | ICD-10-CM | POA: Diagnosis not present

## 2024-07-26 DIAGNOSIS — D709 Neutropenia, unspecified: Secondary | ICD-10-CM | POA: Diagnosis not present

## 2024-07-26 DIAGNOSIS — N189 Chronic kidney disease, unspecified: Secondary | ICD-10-CM | POA: Insufficient documentation

## 2024-07-26 DIAGNOSIS — Z23 Encounter for immunization: Secondary | ICD-10-CM | POA: Diagnosis not present

## 2024-07-26 DIAGNOSIS — E538 Deficiency of other specified B group vitamins: Secondary | ICD-10-CM | POA: Diagnosis not present

## 2024-07-26 LAB — CBC WITH DIFFERENTIAL (CANCER CENTER ONLY)
Abs Immature Granulocytes: 0 K/uL (ref 0.00–0.07)
Basophils Absolute: 0 K/uL (ref 0.0–0.1)
Basophils Relative: 1 %
Eosinophils Absolute: 0.2 K/uL (ref 0.0–0.5)
Eosinophils Relative: 7 %
HCT: 34.7 % — ABNORMAL LOW (ref 36.0–46.0)
Hemoglobin: 11.5 g/dL — ABNORMAL LOW (ref 12.0–15.0)
Immature Granulocytes: 0 %
Lymphocytes Relative: 37 %
Lymphs Abs: 0.9 K/uL (ref 0.7–4.0)
MCH: 30.2 pg (ref 26.0–34.0)
MCHC: 33.1 g/dL (ref 30.0–36.0)
MCV: 91.1 fL (ref 80.0–100.0)
Monocytes Absolute: 0.2 K/uL (ref 0.1–1.0)
Monocytes Relative: 9 %
Neutro Abs: 1.2 K/uL — ABNORMAL LOW (ref 1.7–7.7)
Neutrophils Relative %: 46 %
Platelet Count: 156 K/uL (ref 150–400)
RBC: 3.81 MIL/uL — ABNORMAL LOW (ref 3.87–5.11)
RDW: 12.8 % (ref 11.5–15.5)
WBC Count: 2.5 K/uL — ABNORMAL LOW (ref 4.0–10.5)
nRBC: 0 % (ref 0.0–0.2)

## 2024-07-26 LAB — VITAMIN B12: Vitamin B-12: 505 pg/mL (ref 180–914)

## 2024-07-30 ENCOUNTER — Encounter: Payer: Self-pay | Admitting: Nurse Practitioner

## 2024-07-30 ENCOUNTER — Other Ambulatory Visit: Payer: Self-pay

## 2024-07-30 ENCOUNTER — Inpatient Hospital Stay

## 2024-07-30 ENCOUNTER — Inpatient Hospital Stay (HOSPITAL_BASED_OUTPATIENT_CLINIC_OR_DEPARTMENT_OTHER): Admitting: Nurse Practitioner

## 2024-07-30 VITALS — BP 146/69 | HR 90 | Temp 97.8°F | Resp 18 | Ht 66.0 in | Wt 136.1 lb

## 2024-07-30 DIAGNOSIS — Z23 Encounter for immunization: Secondary | ICD-10-CM

## 2024-07-30 DIAGNOSIS — D709 Neutropenia, unspecified: Secondary | ICD-10-CM

## 2024-07-30 MED ORDER — INFLUENZA VAC SPLIT HIGH-DOSE 0.5 ML IM SUSY
0.5000 mL | PREFILLED_SYRINGE | Freq: Once | INTRAMUSCULAR | Status: AC
  Administered 2024-07-30: 0.5 mL via INTRAMUSCULAR
  Filled 2024-07-30: qty 0.5

## 2024-07-30 NOTE — Patient Instructions (Signed)

## 2024-07-30 NOTE — Progress Notes (Signed)
 [facility]   [phone] [phone]    Patient Care Team: Job Lukes, [place] as PCP - General (Physician Assistant) Lonni Slain, MD as PCP - Cardiology (Cardiology) Elmira Newman PARAS, MD as Consulting Physician (Cardiology) Gearline Norris, MD as Consulting Physician (Nephrology) Georgina Speaks, FNP (General Practice) Dohmeier, Dedra, MD as Consulting Physician (Neurology) [facility] as Consulting Physician   CHIEF COMPLAINT: Follow-up anemia, neutropenia  CURRENT THERAPY: B12 once a week  INTERVAL HISTORY Catherine Mcconnell returns with her daughter for follow-up as scheduled, doing well.  No changes since her visit a month ago.  Specifically she denies low appetite, unintentional weight loss, fever, night sweats, pain, bleeding, bruising, dyspnea.  ROS  All other systems reviewed and negative  Past Medical History:  Diagnosis Date   Anemia    on meds   Arthritis    hips/wrists/back   Cataract    sx-bilateral   Chronic kidney disease    stage 3   Diabetes mellitus without complication (HCC)    on meds   Heart murmur    Hypertension    on meds   Iron deficiency    on oral iron supplements, has not required transfusion   Mixed hyperlipidemia    on meds   OSA on CPAP    compliant   Sleep apnea    uses CPAP   Thyroid  nodule    removed-not on meds at this time (12/01/2020)     Past Surgical History:  Procedure Laterality Date   ABDOMINAL HYSTERECTOMY  1996   EYE SURGERY     JOINT REPLACEMENT  08/06/21   MASS EXCISION Right 2017   right arm mass removal   THYROID  SURGERY     left side removal benign nodule   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   Procedure: RIGHT TOTAL HIP ARTHROPLASTY ANTERIOR APPROACH;  Surgeon: Jerri Kay HERO, MD;  Location: [facility] OR;  Service: Orthopedics;  Laterality: Right;  3-C     Outpatient Encounter Medications as of 07/30/2024  Medication Sig   Accu-Chek Softclix  Lancets lancets Use 1 lancet twice daily to test blood glucose levels.   amLODipine  (NORVASC ) 10 MG tablet Take 1 tablet (10 mg total) by mouth daily.   aspirin  EC 81 MG tablet Take 1 tablet (81 mg total) by mouth daily. Swallow whole.   atorvastatin  (LIPITOR) 80 MG tablet Take 1 tablet by mouth every Monday, Wednesday, and Friday.   bimatoprost  (LUMIGAN ) 0.01 % SOLN Place 1 drop into both eyes at bedtime.   Blood Glucose Monitoring Suppl (ONE TOUCH ULTRA MINI) w/Device KIT Use to check blood sugars twice a day.   carvedilol  (COREG ) 6.25 MG tablet Take 1 tablet (6.25 mg total) by mouth 2 (two) times daily with a meal.   dapagliflozin  propanediol (FARXIGA ) 10 MG TABS tablet Take 1 tablet (10 mg total) by mouth daily.   donepezil  (ARICEPT  ODT) 10 MG disintegrating tablet Place 1 tablet (10 mg total) and let dissolve on tongue at bedtime.   Evolocumab  (REPATHA  SURECLICK) 140 MG/ML SOAJ Inject 140 mg into the skin every 14 (fourteen) days.   glucose blood (ACCU-CHEK GUIDE TEST) test strip Use 1 test strip twice daily to test blood glucose levels.   ketorolac (ACULAR) 0.5 % ophthalmic solution Place 1 drop into the left eye 4 (four) times daily. STARTING 1 DAY BEFORE SURGERY   losartan  (COZAAR ) 50 MG tablet Take 1 tablet (50 mg total) by mouth daily.  mirtazapine  (REMERON ) 7.5 MG tablet Take 1 tablet (7.5 mg total) by mouth at bedtime.   nitroGLYCERIN  (NITROSTAT ) 0.4 MG SL tablet Place 1 tablet (0.4 mg total) under the tongue every 5 (five) minutes as needed for chest pain.   ofloxacin (OCUFLOX) 0.3 % ophthalmic solution Place 1 drop into the left eye 4 (four) times daily. STARTING 1 DAY BEFORE SURGERY   prednisoLONE acetate (PRED FORTE) 1 % ophthalmic suspension Place 1 drop into the left eye 4 (four) times daily AFTER SURGERY   Semaglutide  (RYBELSUS ) 3 MG TABS Take 1 tablet (3 mg total) by mouth daily.   traMADol  (ULTRAM ) 50 MG tablet Take 1 tablet (50 mg total) by mouth every 12 (twelve) hours as  needed for severe pain (pain score 7-10).   No facility-administered encounter medications on file as of 07/30/2024.     Today's Vitals   07/30/24 1048 07/30/24 1049 07/30/24 1100  BP: (!) 156/70 (!) 146/69   Pulse: 90    Resp: 18    Temp: 97.8 F (36.6 C)    TempSrc: Temporal    SpO2: 100%    Weight: 136 lb 1.6 oz (61.7 kg)    Height: 5' 6 (1.676 m)    PainSc:   0-No pain   Body mass index is 21.97 kg/m.   PHYSICAL EXAM GENERAL:alert, no distress and comfortable SKIN: no rash  EYES: sclera clear NECK: without mass LYMPH:  no palpable cervical or supraclavicular lymphadenopathy  LUNGS:  normal breathing effort HEART:  no lower extremity edema ABDOMEN: No splenomegaly NEURO: alert & oriented x 3 with fluent speech, no focal motor/sensory deficits   CBC    Latest Ref Rng & Units 07/26/2024   10:43 AM 06/28/2024    8:22 AM 06/17/2024   11:52 AM  CBC  WBC 4.0 - 10.5 K/uL 2.5  2.5  2.5   Hemoglobin 12.0 - 15.0 g/dL 88.4  88.3  88.3   Hematocrit 36.0 - 46.0 % 34.7  34.4  34.7   Platelets 150 - 400 K/uL 156  163  162.0       CMP     Latest Ref Rng & Units 06/17/2024   11:52 AM 03/17/2024   12:00 AM 11/24/2023   10:37 AM  CMP  Glucose 70 - 99 mg/dL 856   871   BUN 6 - 23 mg/dL 29  21     16    Creatinine 0.40 - 1.20 mg/dL 8.21  1.9     8.38   Sodium 135 - 145 mEq/L 139  140     138   Potassium 3.5 - 5.1 mEq/L 4.2  4.3     3.8   Chloride 96 - 112 mEq/L 103  98     103   CO2 19 - 32 mEq/L 29  29     28    Calcium  8.4 - 10.5 mg/dL 9.3  9.9       9.9     9.1   Total Protein 6.0 - 8.3 g/dL 6.7     Total Bilirubin 0.2 - 1.2 mg/dL 0.5     Alkaline Phos 39 - 117 U/L 43     AST 0 - 37 U/L 19     ALT 0 - 35 U/L 17        This result is from an external source.      ASSESSMENT & PLAN:  78 year old female   Leukopenia/neutropenia -She has had mild intermittent leukopenia with neutropenia since at least  2012 -No h/o recurrent infections or autoimmune disorder. No  recent med changes. HCV and B12 normal in the past. No true IDA but has been on oral iron for many years -No sign of autoimmune condition  -She has mild B12 deficiency which can cause leukopenia; could also have cyclical neutropenia or benign ethnic neutropenia  -Taking B12 supplement once a week, B12 level fluctuates. -Catherine Mcconnell appears well, she is asymptomatic.  ANC improved closer to baseline, no signs of infection.  She will receive the flu vaccine today.  I recommend to increase B12 to 3 - 5 x per week    Normocytic anemia, secondary to CKD -CBC normal in 2012, she has had mild intermittent anemia since at least 2019, Hgb 11-13 range -Has been on oral iron for many years, recently decreased from BID to once daily  -Recent ferritin 305, possibly possibly from iron replacement vs anemia of chronic disease -stable, does not need ESA at this time     PLAN: -Labs reviewed -Increase oral B12 to 3-5 x per week -Flu vaccine today -Lab in 3 and 6 months -F/up in 6 months, or sooner if needed ie fever/infection, weight loss, bruising/bleeding, pain or symptoms of anemia   All questions were answered. The patient knows to call the clinic with any problems, questions or concerns. No barriers to learning were detected.  Catherine Mcconnell K Ashlei Chinchilla, NP 07/30/2024

## 2024-07-31 ENCOUNTER — Other Ambulatory Visit (HOSPITAL_BASED_OUTPATIENT_CLINIC_OR_DEPARTMENT_OTHER): Payer: Self-pay | Admitting: Cardiology

## 2024-07-31 DIAGNOSIS — E782 Mixed hyperlipidemia: Secondary | ICD-10-CM

## 2024-07-31 DIAGNOSIS — I2510 Atherosclerotic heart disease of native coronary artery without angina pectoris: Secondary | ICD-10-CM

## 2024-08-03 ENCOUNTER — Other Ambulatory Visit (HOSPITAL_BASED_OUTPATIENT_CLINIC_OR_DEPARTMENT_OTHER): Payer: Self-pay

## 2024-08-03 MED ORDER — ATORVASTATIN CALCIUM 80 MG PO TABS
ORAL_TABLET | ORAL | 0 refills | Status: DC
  Filled 2024-08-03: qty 30, 60d supply, fill #0

## 2024-08-06 ENCOUNTER — Other Ambulatory Visit (HOSPITAL_BASED_OUTPATIENT_CLINIC_OR_DEPARTMENT_OTHER): Payer: Self-pay

## 2024-08-10 DIAGNOSIS — H2512 Age-related nuclear cataract, left eye: Secondary | ICD-10-CM | POA: Diagnosis not present

## 2024-08-16 ENCOUNTER — Encounter: Payer: Self-pay | Admitting: Radiology

## 2024-08-18 ENCOUNTER — Other Ambulatory Visit (HOSPITAL_BASED_OUTPATIENT_CLINIC_OR_DEPARTMENT_OTHER): Payer: Self-pay

## 2024-08-23 DIAGNOSIS — H2511 Age-related nuclear cataract, right eye: Secondary | ICD-10-CM | POA: Diagnosis not present

## 2024-08-26 DIAGNOSIS — H2511 Age-related nuclear cataract, right eye: Secondary | ICD-10-CM | POA: Diagnosis not present

## 2024-09-02 DIAGNOSIS — E785 Hyperlipidemia, unspecified: Secondary | ICD-10-CM | POA: Diagnosis not present

## 2024-09-02 DIAGNOSIS — H5713 Ocular pain, bilateral: Secondary | ICD-10-CM | POA: Diagnosis not present

## 2024-09-02 DIAGNOSIS — H409 Unspecified glaucoma: Secondary | ICD-10-CM | POA: Diagnosis not present

## 2024-09-02 DIAGNOSIS — E1122 Type 2 diabetes mellitus with diabetic chronic kidney disease: Secondary | ICD-10-CM | POA: Diagnosis not present

## 2024-09-02 DIAGNOSIS — I129 Hypertensive chronic kidney disease with stage 1 through stage 4 chronic kidney disease, or unspecified chronic kidney disease: Secondary | ICD-10-CM | POA: Diagnosis not present

## 2024-09-02 DIAGNOSIS — R32 Unspecified urinary incontinence: Secondary | ICD-10-CM | POA: Diagnosis not present

## 2024-09-02 DIAGNOSIS — N184 Chronic kidney disease, stage 4 (severe): Secondary | ICD-10-CM | POA: Diagnosis not present

## 2024-09-02 DIAGNOSIS — G309 Alzheimer's disease, unspecified: Secondary | ICD-10-CM | POA: Diagnosis not present

## 2024-09-02 DIAGNOSIS — H579 Unspecified disorder of eye and adnexa: Secondary | ICD-10-CM | POA: Diagnosis not present

## 2024-09-02 DIAGNOSIS — F325 Major depressive disorder, single episode, in full remission: Secondary | ICD-10-CM | POA: Diagnosis not present

## 2024-09-02 DIAGNOSIS — F0283 Dementia in other diseases classified elsewhere, unspecified severity, with mood disturbance: Secondary | ICD-10-CM | POA: Diagnosis not present

## 2024-09-02 DIAGNOSIS — G4733 Obstructive sleep apnea (adult) (pediatric): Secondary | ICD-10-CM | POA: Diagnosis not present

## 2024-09-07 ENCOUNTER — Other Ambulatory Visit (HOSPITAL_BASED_OUTPATIENT_CLINIC_OR_DEPARTMENT_OTHER): Payer: Self-pay

## 2024-09-13 ENCOUNTER — Other Ambulatory Visit: Payer: Self-pay | Admitting: Physician Assistant

## 2024-09-13 DIAGNOSIS — Z1231 Encounter for screening mammogram for malignant neoplasm of breast: Secondary | ICD-10-CM

## 2024-09-16 ENCOUNTER — Other Ambulatory Visit (HOSPITAL_BASED_OUTPATIENT_CLINIC_OR_DEPARTMENT_OTHER): Payer: Self-pay

## 2024-09-16 ENCOUNTER — Encounter: Payer: Self-pay | Admitting: Nurse Practitioner

## 2024-09-16 ENCOUNTER — Ambulatory Visit: Admitting: Physician Assistant

## 2024-09-16 ENCOUNTER — Other Ambulatory Visit (HOSPITAL_BASED_OUTPATIENT_CLINIC_OR_DEPARTMENT_OTHER): Payer: Self-pay | Admitting: Cardiology

## 2024-09-16 ENCOUNTER — Encounter: Payer: Self-pay | Admitting: Physician Assistant

## 2024-09-16 VITALS — BP 130/64 | HR 55 | Temp 97.3°F | Ht 66.0 in | Wt 137.0 lb

## 2024-09-16 DIAGNOSIS — M25551 Pain in right hip: Secondary | ICD-10-CM

## 2024-09-16 DIAGNOSIS — E119 Type 2 diabetes mellitus without complications: Secondary | ICD-10-CM

## 2024-09-16 DIAGNOSIS — I10 Essential (primary) hypertension: Secondary | ICD-10-CM

## 2024-09-16 DIAGNOSIS — D709 Neutropenia, unspecified: Secondary | ICD-10-CM | POA: Diagnosis not present

## 2024-09-16 DIAGNOSIS — Z7984 Long term (current) use of oral hypoglycemic drugs: Secondary | ICD-10-CM | POA: Diagnosis not present

## 2024-09-16 DIAGNOSIS — N184 Chronic kidney disease, stage 4 (severe): Secondary | ICD-10-CM

## 2024-09-16 DIAGNOSIS — E1122 Type 2 diabetes mellitus with diabetic chronic kidney disease: Secondary | ICD-10-CM | POA: Diagnosis not present

## 2024-09-16 LAB — POCT GLYCOSYLATED HEMOGLOBIN (HGB A1C): Hemoglobin A1C: 6.8 % — AB (ref 4.0–5.6)

## 2024-09-16 NOTE — Progress Notes (Signed)
 History of Present Illness:   Chief Complaint  Patient presents with   Diabetes    3 month follow-up. Pretty steady reading at home. No questions or concerns for today.     Discussed the use of AI scribe software for clinical note transcription with the patient, who gave verbal consent to proceed.  History of Present Illness   Catherine Mcconnell is a 78 year old female who presents for follow-up on her A1c and hip pain.  Her A1c improved from 7.8% to 6.8% with increased activity at home. She manages diabetes with lifestyle changes and medication. She is currently taking Farxiga  10 mg daily, Rybelsus  3 mg daily.  She had prior hip surgery. Her surgeon recently reviewed x-rays and reported the hardware is intact. She has intermittent weather-related hip pain. Reports there is no new/worsening concerns.  She is followed by oncology/hematology for low neutrophil count, with ANC improved from 0.9 to 1.2 but still below goal. She takes oral B12 several times per week.  She has new bruising on her nose from her CPAP mask despite no change in mask type. A mask fitting visit was postponed. She applies ointment to the area during the day.  Her kidney function was last checked in September. Follow-up nephrology evaluation has been postponed to February.        Past Medical History:  Diagnosis Date   Anemia    on meds   Arthritis    hips/wrists/back   Cataract 2020   sx-bilateral   Chronic kidney disease    stage 3   Diabetes mellitus without complication (HCC)    on meds   Heart murmur    Hypertension    on meds   Iron deficiency    on oral iron supplements, has not required transfusion   Mixed hyperlipidemia    on meds   OSA on CPAP    compliant   Sleep apnea    uses CPAP   Thyroid  nodule    removed-not on meds at this time (12/01/2020)     Social History   Tobacco Use   Smoking status: Former    Current packs/day: 0.00    Average packs/day: 0.3 packs/day for 1 year  (0.3 ttl pk-yrs)    Types: Cigarettes    Start date: 70    Quit date: 45    Years since quitting: 65.9   Smokeless tobacco: Never   Tobacco comments:    Smoked briefly in my twenties  Vaping Use   Vaping status: Never Used  Substance Use Topics   Alcohol use: Not Currently   Drug use: Never    Past Surgical History:  Procedure Laterality Date   ABDOMINAL HYSTERECTOMY  1996   EYE SURGERY     JOINT REPLACEMENT  08/06/21   MASS EXCISION Right 2017   right arm mass removal   THYROID  SURGERY     left side removal benign nodule   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   Procedure: RIGHT TOTAL HIP ARTHROPLASTY ANTERIOR APPROACH;  Surgeon: Jerri Kay HERO, MD;  Location: [facility] OR;  Service: Orthopedics;  Laterality: Right;  3-C    Family History  Problem Relation Age of Onset   Stroke Mother    Hypertension Mother    Heart disease Mother    Heart attack Father    Early death Father    Hypertension Father    Diabetes Sister    Early death Sister    Hypertension Sister    Stroke  Sister    Diabetes Sister    Early death Sister    Hyperlipidemia Sister    Kidney disease Sister    Diabetes Sister    Hyperlipidemia Sister    Hypertension Sister    Diabetes Sister    Early death Brother    Arthritis Brother    Heart attack Brother    Heart disease Brother    Arthritis Brother    Memory loss Paternal Uncle    Depression Daughter    Hypertension Daughter    Hyperlipidemia Daughter    Prostate cancer Son    Cancer Son    Early death Son    Cancer Niece        gyn cancer   Colon polyps Neg Hx    Colon cancer Neg Hx    Esophageal cancer Neg Hx    Rectal cancer Neg Hx    Stomach cancer Neg Hx    Sleep apnea Neg Hx     Allergies  Allergen Reactions   Lisinopril Rash   Metformin  And Related Swelling    Current Medications:   Current Outpatient Medications:    Accu-Chek Softclix Lancets lancets, Use 1 lancet twice daily to test blood glucose levels., Disp: 200  each, Rfl: 1   amLODipine  (NORVASC ) 10 MG tablet, Take 1 tablet (10 mg total) by mouth daily., Disp: 90 tablet, Rfl: 1   aspirin  EC 81 MG tablet, Take 1 tablet (81 mg total) by mouth daily. Swallow whole., Disp: 90 tablet, Rfl: 3   atorvastatin  (LIPITOR) 80 MG tablet, Take 1 tablet by mouth every Monday, Wednesday, and Friday., Disp: 30 tablet, Rfl: 0   bimatoprost  (LUMIGAN ) 0.01 % SOLN, Place 1 drop into both eyes at bedtime., Disp: 7.5 mL, Rfl: 4   Blood Glucose Monitoring Suppl (ONE TOUCH ULTRA MINI) w/Device KIT, Use to check blood sugars twice a day., Disp: 1 kit, Rfl: 0   carvedilol  (COREG ) 6.25 MG tablet, Take 1 tablet (6.25 mg total) by mouth 2 (two) times daily with a meal., Disp: 180 tablet, Rfl: 1   dapagliflozin  propanediol (FARXIGA ) 10 MG TABS tablet, Take 1 tablet (10 mg total) by mouth daily., Disp: 30 tablet, Rfl: 11   donepezil  (ARICEPT  ODT) 10 MG disintegrating tablet, Place 1 tablet (10 mg total) and let dissolve on tongue at bedtime., Disp: 100 tablet, Rfl: 0   Evolocumab  (REPATHA  SURECLICK) 140 MG/ML SOAJ, Inject 140 mg into the skin every 14 (fourteen) days., Disp: 2 mL, Rfl: 5   glucose blood (ACCU-CHEK GUIDE TEST) test strip, Use 1 test strip twice daily to test blood glucose levels., Disp: 200 each, Rfl: 1   losartan  (COZAAR ) 50 MG tablet, Take 1 tablet (50 mg total) by mouth daily., Disp: 90 tablet, Rfl: 1   mirtazapine  (REMERON ) 7.5 MG tablet, Take 1 tablet (7.5 mg total) by mouth at bedtime., Disp: 90 tablet, Rfl: 1   nitroGLYCERIN  (NITROSTAT ) 0.4 MG SL tablet, Place 1 tablet (0.4 mg total) under the tongue every 5 (five) minutes as needed for chest pain., Disp: 25 tablet, Rfl: 2   prednisoLONE  acetate (PRED FORTE ) 1 % ophthalmic suspension, Place 1 drop into the left eye 4 (four) times daily AFTER SURGERY, Disp: 10 mL, Rfl: 1   Semaglutide  (RYBELSUS ) 3 MG TABS, Take 1 tablet (3 mg total) by mouth daily., Disp: 90 tablet, Rfl: 1   traMADol  (ULTRAM ) 50 MG tablet, Take 1  tablet (50 mg total) by mouth every 12 (twelve) hours as needed for severe pain (  pain score 7-10)., Disp: 30 tablet, Rfl: 1   Review of Systems:   Negative unless otherwise specified per HPI.  Vitals:   Vitals:   09/16/24 1059  BP: 130/64  Pulse: (!) 55  Temp: (!) 97.3 F (36.3 C)  TempSrc: Temporal  SpO2: 98%  Weight: 137 lb (62.1 kg)  Height: 5' 6 (1.676 m)     Body mass index is 22.11 kg/m.  Physical Exam:   Physical Exam Vitals and nursing note reviewed.  Constitutional:      General: She is not in acute distress.    Appearance: She is well-developed. She is not ill-appearing or toxic-appearing.  Cardiovascular:     Rate and Rhythm: Normal rate and regular rhythm.     Pulses: Normal pulses.     Heart sounds: Normal heart sounds, S1 normal and S2 normal.  Pulmonary:     Effort: Pulmonary effort is normal.     Breath sounds: Normal breath sounds.  Skin:    General: Skin is warm and dry.  Neurological:     Mental Status: She is alert.     GCS: GCS eye subscore is 4. GCS verbal subscore is 5. GCS motor subscore is 6.  Psychiatric:        Speech: Speech normal.        Behavior: Behavior normal. Behavior is cooperative.     Assessment and Plan:   Assessment and Plan    Type 2 diabetes mellitus Improved glycemic control. Hemoglobin A1c decreased from 7.8% to 6.8%. - Continue current diabetes management plan of Farxiga  10 mg daily, Rybelsus  3 mg daily.  Right hip pain Postoperative right hip pain, weather-related. No significant concerns from orthopedic follow-up. Hardware and screws are intact. - No further orthopedic follow-up needed unless symptoms worsen.  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 Management per hematology. - Continue oral B12 supplementation 3-5 times per week. - Monitor blood counts in 3 and 6 months.  Chronic kidney disease 4 Well-managed kidney function. - Continue monitoring  kidney function as per nephrology follow-up schedule.  Follow up in 4 month(s)   Lucie Buttner, PA-C

## 2024-09-17 ENCOUNTER — Encounter

## 2024-09-17 DIAGNOSIS — Z1231 Encounter for screening mammogram for malignant neoplasm of breast: Secondary | ICD-10-CM

## 2024-09-20 ENCOUNTER — Other Ambulatory Visit (HOSPITAL_COMMUNITY): Payer: Self-pay

## 2024-09-21 ENCOUNTER — Other Ambulatory Visit (HOSPITAL_BASED_OUTPATIENT_CLINIC_OR_DEPARTMENT_OTHER): Payer: Self-pay

## 2024-09-21 ENCOUNTER — Other Ambulatory Visit (HOSPITAL_COMMUNITY): Payer: Self-pay

## 2024-09-21 ENCOUNTER — Other Ambulatory Visit (HOSPITAL_COMMUNITY): Payer: Self-pay | Admitting: Physician Assistant

## 2024-09-21 DIAGNOSIS — Z1231 Encounter for screening mammogram for malignant neoplasm of breast: Secondary | ICD-10-CM

## 2024-09-21 MED ORDER — CARVEDILOL 6.25 MG PO TABS
6.2500 mg | ORAL_TABLET | Freq: Two times a day (BID) | ORAL | 0 refills | Status: AC
  Filled 2024-09-21: qty 60, 30d supply, fill #0

## 2024-09-21 MED ORDER — AMLODIPINE BESYLATE 10 MG PO TABS
10.0000 mg | ORAL_TABLET | Freq: Every day | ORAL | 0 refills | Status: AC
  Filled 2024-09-21: qty 30, 30d supply, fill #0

## 2024-09-21 MED ORDER — DAPAGLIFLOZIN PROPANEDIOL 10 MG PO TABS
10.0000 mg | ORAL_TABLET | Freq: Every day | ORAL | 3 refills | Status: AC
  Filled 2024-09-21 – 2024-09-24 (×2): qty 30, 30d supply, fill #0
  Filled 2024-10-25 (×3): qty 30, 30d supply, fill #1

## 2024-09-21 MED ORDER — LOSARTAN POTASSIUM 50 MG PO TABS
50.0000 mg | ORAL_TABLET | Freq: Every day | ORAL | 0 refills | Status: AC
  Filled 2024-09-21: qty 30, 30d supply, fill #0

## 2024-09-23 ENCOUNTER — Encounter (HOSPITAL_COMMUNITY): Payer: Self-pay

## 2024-09-23 ENCOUNTER — Inpatient Hospital Stay (HOSPITAL_COMMUNITY): Admission: RE | Admit: 2024-09-23 | Discharge: 2024-09-23

## 2024-09-23 DIAGNOSIS — Z1231 Encounter for screening mammogram for malignant neoplasm of breast: Secondary | ICD-10-CM | POA: Diagnosis present

## 2024-09-24 ENCOUNTER — Other Ambulatory Visit (HOSPITAL_BASED_OUTPATIENT_CLINIC_OR_DEPARTMENT_OTHER): Payer: Self-pay

## 2024-09-24 ENCOUNTER — Other Ambulatory Visit (HOSPITAL_COMMUNITY): Payer: Self-pay

## 2024-09-27 ENCOUNTER — Other Ambulatory Visit (HOSPITAL_COMMUNITY): Payer: Self-pay

## 2024-10-18 ENCOUNTER — Other Ambulatory Visit (HOSPITAL_BASED_OUTPATIENT_CLINIC_OR_DEPARTMENT_OTHER): Payer: Self-pay | Admitting: Cardiology

## 2024-10-18 ENCOUNTER — Other Ambulatory Visit: Payer: Self-pay | Admitting: Physician Assistant

## 2024-10-18 DIAGNOSIS — E782 Mixed hyperlipidemia: Secondary | ICD-10-CM

## 2024-10-18 DIAGNOSIS — I2510 Atherosclerotic heart disease of native coronary artery without angina pectoris: Secondary | ICD-10-CM

## 2024-10-19 ENCOUNTER — Other Ambulatory Visit: Payer: Self-pay | Admitting: Neurology

## 2024-10-19 ENCOUNTER — Other Ambulatory Visit: Payer: Self-pay

## 2024-10-19 ENCOUNTER — Other Ambulatory Visit (HOSPITAL_BASED_OUTPATIENT_CLINIC_OR_DEPARTMENT_OTHER): Payer: Self-pay

## 2024-10-19 MED ORDER — MIRTAZAPINE 7.5 MG PO TABS
7.5000 mg | ORAL_TABLET | Freq: Every day | ORAL | 1 refills | Status: AC
  Filled 2024-10-19: qty 90, 90d supply, fill #0

## 2024-10-20 ENCOUNTER — Other Ambulatory Visit: Payer: Self-pay

## 2024-10-20 ENCOUNTER — Other Ambulatory Visit (HOSPITAL_BASED_OUTPATIENT_CLINIC_OR_DEPARTMENT_OTHER): Payer: Self-pay

## 2024-10-20 MED ORDER — ATORVASTATIN CALCIUM 80 MG PO TABS
ORAL_TABLET | ORAL | 0 refills | Status: AC
  Filled 2024-10-20: qty 30, 60d supply, fill #0

## 2024-10-22 ENCOUNTER — Other Ambulatory Visit: Payer: Self-pay

## 2024-10-22 ENCOUNTER — Other Ambulatory Visit (HOSPITAL_BASED_OUTPATIENT_CLINIC_OR_DEPARTMENT_OTHER): Payer: Self-pay

## 2024-10-22 MED ORDER — DONEPEZIL HCL 10 MG PO TBDP
10.0000 mg | ORAL_TABLET | Freq: Every day | ORAL | 0 refills | Status: AC
  Filled 2024-10-22: qty 100, 100d supply, fill #0

## 2024-10-22 NOTE — Telephone Encounter (Signed)
 Last seen on 10/28/23 Follow up scheduled on 11/18/24

## 2024-10-25 ENCOUNTER — Other Ambulatory Visit: Payer: Self-pay

## 2024-10-25 ENCOUNTER — Other Ambulatory Visit (HOSPITAL_BASED_OUTPATIENT_CLINIC_OR_DEPARTMENT_OTHER): Payer: Self-pay

## 2024-10-26 ENCOUNTER — Other Ambulatory Visit (HOSPITAL_BASED_OUTPATIENT_CLINIC_OR_DEPARTMENT_OTHER): Payer: Self-pay

## 2024-10-26 ENCOUNTER — Telehealth: Payer: Medicare HMO | Admitting: Adult Health

## 2024-10-26 ENCOUNTER — Other Ambulatory Visit: Payer: Self-pay | Admitting: Physician Assistant

## 2024-10-26 ENCOUNTER — Other Ambulatory Visit: Payer: Self-pay

## 2024-10-28 ENCOUNTER — Ambulatory Visit: Payer: Medicare HMO

## 2024-10-28 VITALS — BP 120/64 | HR 52 | Temp 97.8°F | Ht 67.0 in | Wt 136.4 lb

## 2024-10-28 DIAGNOSIS — Z0000 Encounter for general adult medical examination without abnormal findings: Secondary | ICD-10-CM

## 2024-10-28 NOTE — Progress Notes (Signed)
 "  Chief Complaint  Patient presents with   Medicare Wellness     Subjective:   Catherine Mcconnell is a 79 y.o. female who presents for a Medicare Annual Wellness Visit.  Visit info / Clinical Intake: Medicare Wellness Visit Type:: Subsequent Annual Wellness Visit Persons participating in visit and providing information:: patient & caregiver Medicare Wellness Visit Mode:: In-person (required for WTM) Interpreter Needed?: No Pre-visit prep was completed: yes AWV questionnaire completed by patient prior to visit?: yes Date:: 10/28/24 Living arrangements:: (Patient-Rptd) with family/others Patient's Overall Health Status Rating: (Patient-Rptd) good Typical amount of pain: (Patient-Rptd) some Does pain affect daily life?: (!) (Patient-Rptd) yes Are you currently prescribed opioids?: (!) yes  Dietary Habits and Nutritional Risks How many meals a day?: (Patient-Rptd) 2 Eats fruit and vegetables daily?: (Patient-Rptd) yes Most meals are obtained by: (Patient-Rptd) preparing own meals; having others provide food In the last 2 weeks, have you had any of the following?: none Diabetic:: (!) yes Any non-healing wounds?: no How often do you check your BS?: 2 Would you like to be referred to a Nutritionist or for Diabetic Management? : no  Functional Status Activities of Daily Living (to include ambulation/medication): (!) Needs Assist (bi pap) Ambulation: Independent with device- listed below Home Assistive Devices/Equipment: Eyeglasses; Cane; CPAP Medication Administration: (Patient-Rptd) Needs assistance (comment) Home Management (perform basic housework or laundry): (Patient-Rptd) Needs assistance (comment) Manage your own finances?: (!) (Patient-Rptd) no Primary transportation is: (Patient-Rptd) family / friends Concerns about vision?: no *vision screening is required for WTM* Concerns about hearing?: no  Fall Screening Falls in the past year?: (Patient-Rptd) 0 Number of falls in past  year: 0 Was there an injury with Fall?: 0 Fall Risk Category Calculator: 0 Patient Fall Risk Level: Low Fall Risk  Fall Risk Patient at Risk for Falls Due to: Impaired balance/gait; Impaired mobility Fall risk Follow up: Falls evaluation completed  Home and Transportation Safety: All rugs have non-skid backing?: (Patient-Rptd) yes All stairs or steps have railings?: (Patient-Rptd) yes Grab bars in the bathtub or shower?: (!) (Patient-Rptd) no Have non-skid surface in bathtub or shower?: (Patient-Rptd) yes Good home lighting?: (Patient-Rptd) yes Regular seat belt use?: (Patient-Rptd) yes Hospital stays in the last year:: (Patient-Rptd) no  Cognitive Assessment Difficulty concentrating, remembering, or making decisions? : (Patient-Rptd) yes Will 6CIT or Mini Cog be Completed: yes What year is it?: 0 points What month is it?: 0 points Give patient an address phrase to remember (5 components): [address] [place]  About what time is it?: 0 points Count backwards from 20 to 1: 0 points Say the months of the year in reverse: 0 points Repeat the address phrase from earlier: 10 points 6 CIT Score: 10 points  Advance Directives (For Healthcare) Does Patient Have a Medical Advance Directive?: Yes Does patient want to make changes to medical advance directive?: No - Patient declined Type of Advance Directive: Healthcare Power of Attorney Copy of Healthcare Power of Attorney in Chart?: Yes - validated most recent copy scanned in chart (See row information) Would patient like information on creating a medical advance directive?: No - Patient declined  Reviewed/Updated  Reviewed/Updated: Reviewed All (Medical, Surgical, Family, Medications, Allergies, Care Teams, Patient Goals)    Allergies (verified) Lisinopril and Metformin  and related   Current Medications (verified) Outpatient Encounter Medications as of 10/28/2024  Medication Sig   Accu-Chek Softclix Lancets lancets Use 1  lancet twice daily to test blood glucose levels.   amLODipine  (NORVASC ) 10 MG tablet Take  1 tablet (10 mg total) by mouth daily. Pt must schedule an overdue followup appt with Cardiology for any more refills.   aspirin  EC 81 MG tablet Take 1 tablet (81 mg total) by mouth daily. Swallow whole.   atorvastatin  (LIPITOR) 80 MG tablet Take 1 tablet by mouth every Monday, Wednesday, and Friday.   bimatoprost  (LUMIGAN ) 0.01 % SOLN Place 1 drop into both eyes at bedtime.   Blood Glucose Monitoring Suppl (ONE TOUCH ULTRA MINI) w/Device KIT Use to check blood sugars twice a day.   carvedilol  (COREG ) 6.25 MG tablet Take 1 tablet (6.25 mg total) by mouth 2 (two) times daily with a meal. Pt must schedule an overdue followup appt with Cardiology for any more refills. [phone] 1st attempt Thank You   dapagliflozin  propanediol (FARXIGA ) 10 MG TABS tablet Take 1 tablet (10 mg total) by mouth daily.   donepezil  (ARICEPT  ODT) 10 MG disintegrating tablet Place 1 tablet (10 mg total) and let dissolve on tongue at bedtime.   Evolocumab  (REPATHA  SURECLICK) 140 MG/ML SOAJ Inject 140 mg into the skin every 14 (fourteen) days.   glucose blood (ACCU-CHEK GUIDE TEST) test strip Use 1 test strip twice daily to test blood glucose levels.   losartan  (COZAAR ) 50 MG tablet Take 1 tablet (50 mg total) by mouth daily. Pt must schedule an overdue followup appt with Cardiology for any more refills. [phone] 1st attempt Thank You   mirtazapine  (REMERON ) 7.5 MG tablet Take 1 tablet (7.5 mg total) by mouth at bedtime.   nitroGLYCERIN  (NITROSTAT ) 0.4 MG SL tablet Place 1 tablet (0.4 mg total) under the tongue every 5 (five) minutes as needed for chest pain.   Semaglutide  (RYBELSUS ) 3 MG TABS Take 1 tablet (3 mg total) by mouth daily.   traMADol  (ULTRAM ) 50 MG tablet Take 1 tablet (50 mg total) by mouth every 12 (twelve) hours as needed for severe pain (pain score 7-10).   [DISCONTINUED] prednisoLONE  acetate (PRED FORTE ) 1 %  ophthalmic suspension Place 1 drop into the left eye 4 (four) times daily AFTER SURGERY   No facility-administered encounter medications on file as of 10/28/2024.    History: Past Medical History:  Diagnosis Date   Anemia    on meds   Arthritis    hips/wrists/back   Cataract 2020   sx-bilateral   Chronic kidney disease    stage 3   Diabetes mellitus without complication (HCC)    on meds   Heart murmur    Hypertension    on meds   Iron deficiency    on oral iron supplements, has not required transfusion   Mixed hyperlipidemia    on meds   OSA on CPAP    compliant   Sleep apnea    uses CPAP   Thyroid  nodule    removed-not on meds at this time (12/01/2020)   Past Surgical History:  Procedure Laterality Date   ABDOMINAL HYSTERECTOMY  1996   EYE SURGERY     JOINT REPLACEMENT  08/06/21   MASS EXCISION Right 2017   right arm mass removal   THYROID  SURGERY     left side removal benign nodule   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   Procedure: RIGHT TOTAL HIP ARTHROPLASTY ANTERIOR APPROACH;  Surgeon: Jerri Kay HERO, MD;  Location: [facility] OR;  Service: Orthopedics;  Laterality: Right;  3-C   Family History  Problem Relation Age of Onset   Stroke Mother    Hypertension Mother    Heart disease Mother  Heart attack Father    Early death Father    Hypertension Father    Diabetes Sister    Early death Sister    Hypertension Sister    Stroke Sister    Diabetes Sister    Early death Sister    Hyperlipidemia Sister    Kidney disease Sister    Diabetes Sister    Hyperlipidemia Sister    Hypertension Sister    Diabetes Sister    Early death Brother    Arthritis Brother    Heart attack Brother    Heart disease Brother    Arthritis Brother    Memory loss Paternal Uncle    Depression Daughter    Hypertension Daughter    Hyperlipidemia Daughter    Prostate cancer Son    Cancer Son    Early death Son    Cancer Niece        gyn cancer   Colon polyps Neg Hx    Colon  cancer Neg Hx    Esophageal cancer Neg Hx    Rectal cancer Neg Hx    Stomach cancer Neg Hx    Sleep apnea Neg Hx    Social History   Occupational History   Occupation: Retired   Tobacco Use   Smoking status: Former    Current packs/day: 0.00    Average packs/day: 0.3 packs/day for 1 year (0.3 ttl pk-yrs)    Types: Cigarettes    Start date: 76    Quit date: 1960    Years since quitting: 66.0   Smokeless tobacco: Never   Tobacco comments:    Smoked briefly in my twenties  Vaping Use   Vaping status: Never Used  Substance and Sexual Activity   Alcohol use: Not Currently   Drug use: Never   Sexual activity: Not Currently    Birth control/protection: Abstinence   Tobacco Counseling Counseling given: Not Answered Tobacco comments: Smoked briefly in my twenties  SDOH Screenings   Food Insecurity: No Food Insecurity (10/28/2024)  Housing: Low Risk (10/28/2024)  Transportation Needs: No Transportation Needs (10/28/2024)  Utilities: Not At Risk (10/28/2024)  Alcohol Screen: Low Risk (10/28/2024)  Depression (PHQ2-9): Low Risk (10/28/2024)  Financial Resource Strain: Low Risk (09/15/2024)  Physical Activity: Inactive (10/28/2024)  Social Connections: Moderately Isolated (10/28/2024)  Stress: No Stress Concern Present (10/28/2024)  Tobacco Use: Medium Risk (10/28/2024)  Health Literacy: Adequate Health Literacy (10/28/2024)   See flowsheets for full screening details  Depression Screen PHQ 2 & 9 Depression Scale- Over the past 2 weeks, how often have you been bothered by any of the following problems? Little interest or pleasure in doing things: 0 Feeling down, depressed, or hopeless (PHQ Adolescent also includes...irritable): 0 PHQ-2 Total Score: 0 Trouble falling or staying asleep, or sleeping too much: 0 Feeling tired or having little energy: 0 Poor appetite or overeating (PHQ Adolescent also includes...weight loss): 0 Feeling bad about yourself - or that you are a failure or  have let yourself or your family down: 0 Trouble concentrating on things, such as reading the newspaper or watching television (PHQ Adolescent also includes...like school work): 0 Moving or speaking so slowly that other people could have noticed. Or the opposite - being so fidgety or restless that you have been moving around a lot more than usual: 0 Thoughts that you would be better off dead, or of hurting yourself in some way: 0 PHQ-9 Total Score: 0 If you checked off any problems, how difficult have these problems made  it for you to do your work, take care of things at home, or get along with other people?: Not difficult at all  Depression Treatment Depression Interventions/Treatment : EYV7-0 Score <4 Follow-up Not Indicated     Goals Addressed               This Visit's Progress     be more active (pt-stated)        Be more active              Objective:    Today's Vitals   10/28/24 1017  BP: 120/64  Pulse: (!) 52  Temp: 97.8 F (36.6 C)  SpO2: 96%  Weight: 136 lb 6.4 oz (61.9 kg)  Height: 5' 7 (1.702 m)   Body mass index is 21.36 kg/m.  Hearing/Vision screen Hearing Screening - Comments:: Pt denies any hearing issues  Vision Screening - Comments:: Wears rx glasses - up to date with routine eye exams with Dr Octavia  Immunizations and Health Maintenance Health Maintenance  Topic Date Due   COVID-19 Vaccine (4 - 2025-26 season) 06/14/2024   Diabetic kidney evaluation - Urine ACR  03/17/2025   HEMOGLOBIN A1C  03/17/2025   Diabetic kidney evaluation - eGFR measurement  06/17/2025   OPHTHALMOLOGY EXAM  07/19/2025   FOOT EXAM  09/16/2025   Medicare Annual Wellness (AWV)  10/28/2025   Bone Density Scan  09/22/2026   DTaP/Tdap/Td (2 - Td or Tdap) 08/05/2028   Colonoscopy  12/16/2030   Pneumococcal Vaccine: 50+ Years  Completed   Influenza Vaccine  Completed   Hepatitis C Screening  Completed   Zoster Vaccines- Shingrix  Completed   Meningococcal B Vaccine   Aged Out   Mammogram  Discontinued        Assessment/Plan:  This is a routine wellness examination for [place].  Patient Care Team: Job Lukes, [place] as PCP - General (Physician Assistant) Lonni Slain, MD as PCP - Cardiology (Cardiology) Elmira Newman PARAS, MD as Consulting Physician (Cardiology) Gearline Norris, MD as Consulting Physician (Nephrology) Georgina Speaks, FNP (General Practice) Dohmeier, Dedra, MD as Consulting Physician (Neurology) [facility] as Consulting Physician  I have personally reviewed and noted the following in the patients chart:   Medical and social history Use of alcohol, tobacco or illicit drugs  Current medications and supplements including opioid prescriptions. Functional ability and status Nutritional status Physical activity Advanced directives List of other physicians Hospitalizations, surgeries, and ER visits in previous 12 months Vitals Screenings to include cognitive, depression, and falls Referrals and appointments  No orders of the defined types were placed in this encounter.  In addition, I have reviewed and discussed with patient certain preventive protocols, quality metrics, and best practice recommendations. A written personalized care plan for preventive services as well as general preventive health recommendations were provided to patient.   Ellouise VEAR Haws, LPN   8/84/7973   Return in about 1 year (around 10/31/2025).  After Visit Summary: (In Person-Printed) AVS printed and given to the patient  Nurse Notes: HM Addressed: Vaccines Due: and discussed   "

## 2024-10-28 NOTE — Patient Instructions (Signed)
 Ms. Catherine Mcconnell,  Thank you for taking the time for your Medicare Wellness Visit. I appreciate your continued commitment to your health goals. Please review the care plan we discussed, and feel free to reach out if I can assist you further.  Please note that Annual Wellness Visits do not include a physical exam. Some assessments may be limited, especially if the visit was conducted virtually. If needed, we may recommend an in-person follow-up with your provider.  Ongoing Care Seeing your primary care provider every 3 to 6 months helps us  monitor your health and provide consistent, personalized care.   Referrals If a referral was made during today's visit and you haven't received any updates within two weeks, please contact the referred provider directly to check on the status.  Recommended Screenings:  Health Maintenance  Topic Date Due   COVID-19 Vaccine (4 - 2025-26 season) 06/14/2024   Medicare Annual Wellness Visit  10/26/2024   Yearly kidney health urinalysis for diabetes  03/17/2025   Hemoglobin A1C  03/17/2025   Yearly kidney function blood test for diabetes  06/17/2025   Eye exam for diabetics  07/19/2025   Complete foot exam   09/16/2025   Osteoporosis screening with Bone Density Scan  09/22/2026   DTaP/Tdap/Td vaccine (2 - Td or Tdap) 08/05/2028   Colon Cancer Screening  12/16/2030   Pneumococcal Vaccine for age over 65  Completed   Flu Shot  Completed   Hepatitis C Screening  Completed   Zoster (Shingles) Vaccine  Completed   Meningitis B Vaccine  Aged Out   Breast Cancer Screening  Discontinued       07/30/2024   11:00 AM  Advanced Directives  Does Patient Have a Medical Advance Directive? No  Would patient like information on creating a medical advance directive? No - Patient declined    Vision: Annual vision screenings are recommended for early detection of glaucoma, cataracts, and diabetic retinopathy. These exams can also reveal signs of chronic conditions such as  diabetes and high blood pressure.  Dental: Annual dental screenings help detect early signs of oral cancer, gum disease, and other conditions linked to overall health, including heart disease and diabetes.  Please see the attached documents for additional preventive care recommendations.

## 2024-10-29 ENCOUNTER — Inpatient Hospital Stay: Attending: Nurse Practitioner

## 2024-10-29 DIAGNOSIS — I129 Hypertensive chronic kidney disease with stage 1 through stage 4 chronic kidney disease, or unspecified chronic kidney disease: Secondary | ICD-10-CM | POA: Diagnosis not present

## 2024-10-29 DIAGNOSIS — D631 Anemia in chronic kidney disease: Secondary | ICD-10-CM | POA: Diagnosis not present

## 2024-10-29 DIAGNOSIS — D709 Neutropenia, unspecified: Secondary | ICD-10-CM | POA: Insufficient documentation

## 2024-10-29 DIAGNOSIS — N189 Chronic kidney disease, unspecified: Secondary | ICD-10-CM | POA: Diagnosis not present

## 2024-10-29 DIAGNOSIS — E538 Deficiency of other specified B group vitamins: Secondary | ICD-10-CM | POA: Diagnosis not present

## 2024-10-29 LAB — CBC WITH DIFFERENTIAL (CANCER CENTER ONLY)
Abs Immature Granulocytes: 0.01 K/uL (ref 0.00–0.07)
Basophils Absolute: 0 K/uL (ref 0.0–0.1)
Basophils Relative: 1 %
Eosinophils Absolute: 0.1 K/uL (ref 0.0–0.5)
Eosinophils Relative: 5 %
HCT: 33.7 % — ABNORMAL LOW (ref 36.0–46.0)
Hemoglobin: 11.2 g/dL — ABNORMAL LOW (ref 12.0–15.0)
Immature Granulocytes: 0 %
Lymphocytes Relative: 37 %
Lymphs Abs: 0.8 K/uL (ref 0.7–4.0)
MCH: 29.9 pg (ref 26.0–34.0)
MCHC: 33.2 g/dL (ref 30.0–36.0)
MCV: 90.1 fL (ref 80.0–100.0)
Monocytes Absolute: 0.2 K/uL (ref 0.1–1.0)
Monocytes Relative: 10 %
Neutro Abs: 1.1 K/uL — ABNORMAL LOW (ref 1.7–7.7)
Neutrophils Relative %: 47 %
Platelet Count: 137 K/uL — ABNORMAL LOW (ref 150–400)
RBC: 3.74 MIL/uL — ABNORMAL LOW (ref 3.87–5.11)
RDW: 12.2 % (ref 11.5–15.5)
WBC Count: 2.3 K/uL — ABNORMAL LOW (ref 4.0–10.5)
nRBC: 0 % (ref 0.0–0.2)

## 2024-11-04 ENCOUNTER — Other Ambulatory Visit (HOSPITAL_BASED_OUTPATIENT_CLINIC_OR_DEPARTMENT_OTHER): Payer: Self-pay

## 2024-11-04 ENCOUNTER — Other Ambulatory Visit: Payer: Self-pay | Admitting: Physician Assistant

## 2024-11-04 MED FILL — Glucose Blood Test Strip: 100 days supply | Qty: 200 | Fill #0 | Status: AC

## 2024-11-05 ENCOUNTER — Other Ambulatory Visit: Payer: Self-pay | Admitting: Nurse Practitioner

## 2024-11-05 ENCOUNTER — Ambulatory Visit: Payer: Self-pay | Admitting: Nurse Practitioner

## 2024-11-05 DIAGNOSIS — D709 Neutropenia, unspecified: Secondary | ICD-10-CM

## 2024-11-17 NOTE — Progress Notes (Unsigned)
 Setup date 03/26/2017

## 2024-11-18 ENCOUNTER — Ambulatory Visit: Admitting: Adult Health

## 2024-11-18 ENCOUNTER — Encounter: Payer: Self-pay | Admitting: Adult Health

## 2024-11-18 VITALS — BP 128/64 | HR 51 | Ht 66.0 in | Wt 133.4 lb

## 2024-11-18 DIAGNOSIS — G4733 Obstructive sleep apnea (adult) (pediatric): Secondary | ICD-10-CM

## 2024-11-18 NOTE — Progress Notes (Signed)
 "   PATIENT: Catherine Mcconnell DOB: 1946/09/14  REASON FOR VISIT: follow up HISTORY FROM: patient   HISTORY OF PRESENT ILLNESS: Today 11/18/24:  Catherine Mcconnell is a 79 y.o. female with a history of OSA on CPAP. Returns today for follow-up.  She reports that CPAP is working well for her.  She is currently wearing a fullface mask.  Her last sleep study was in 2024 and showed severe sleep apnea.  Her download is below    REVIEW OF SYSTEMS: Out of a complete 14 system review of symptoms, the patient complains only of the following symptoms, and all other reviewed systems are negative.  FSS ESS  ALLERGIES: Allergies[1]  HOME MEDICATIONS: Outpatient Medications Prior to Visit  Medication Sig Dispense Refill   glucose blood (ACCU-CHEK GUIDE TEST) test strip Use 1 test strip twice daily to test blood glucose levels. 200 each 1   Accu-Chek Softclix Lancets lancets Use 1 lancet twice daily to test blood glucose levels. 200 each 1   amLODipine  (NORVASC ) 10 MG tablet Take 1 tablet (10 mg total) by mouth daily. Pt must schedule an overdue followup appt with Cardiology for any more refills. 30 tablet 0   aspirin  EC 81 MG tablet Take 1 tablet (81 mg total) by mouth daily. Swallow whole. 90 tablet 3   atorvastatin  (LIPITOR) 80 MG tablet Take 1 tablet by mouth every Monday, Wednesday, and Friday. 30 tablet 0   bimatoprost  (LUMIGAN ) 0.01 % SOLN Place 1 drop into both eyes at bedtime. 7.5 mL 4   Blood Glucose Monitoring Suppl (ONE TOUCH ULTRA MINI) w/Device KIT Use to check blood sugars twice a day. 1 kit 0   carvedilol  (COREG ) 6.25 MG tablet Take 1 tablet (6.25 mg total) by mouth 2 (two) times daily with a meal. Pt must schedule an overdue followup appt with Cardiology for any more refills. [phone] 1st attempt Thank You 60 tablet 0   dapagliflozin  propanediol (FARXIGA ) 10 MG TABS tablet Take 1 tablet (10 mg total) by mouth daily. 30 tablet 3   donepezil  (ARICEPT  ODT) 10 MG disintegrating tablet  Place 1 tablet (10 mg total) and let dissolve on tongue at bedtime. 100 tablet 0   Evolocumab  (REPATHA  SURECLICK) 140 MG/ML SOAJ Inject 140 mg into the skin every 14 (fourteen) days. 2 mL 5   losartan  (COZAAR ) 50 MG tablet Take 1 tablet (50 mg total) by mouth daily. Pt must schedule an overdue followup appt with Cardiology for any more refills. [phone] 1st attempt Thank You 30 tablet 0   mirtazapine  (REMERON ) 7.5 MG tablet Take 1 tablet (7.5 mg total) by mouth at bedtime. 90 tablet 1   nitroGLYCERIN  (NITROSTAT ) 0.4 MG SL tablet Place 1 tablet (0.4 mg total) under the tongue every 5 (five) minutes as needed for chest pain. 25 tablet 2   Semaglutide  (RYBELSUS ) 3 MG TABS Take 1 tablet (3 mg total) by mouth daily. 90 tablet 1   traMADol  (ULTRAM ) 50 MG tablet Take 1 tablet (50 mg total) by mouth every 12 (twelve) hours as needed for severe pain (pain score 7-10). 30 tablet 1   No facility-administered medications prior to visit.    PAST MEDICAL HISTORY: Past Medical History:  Diagnosis Date   Anemia    on meds   Arthritis    hips/wrists/back   Cataract 2020   sx-bilateral   Chronic kidney disease    stage 3   Diabetes mellitus without complication (HCC)    on meds   Heart  murmur    Hypertension    on meds   Iron deficiency    on oral iron supplements, has not required transfusion   Mixed hyperlipidemia    on meds   OSA on CPAP    compliant   Sleep apnea    uses CPAP   Thyroid  nodule    removed-not on meds at this time (12/01/2020)    PAST SURGICAL HISTORY: Past Surgical History:  Procedure Laterality Date   ABDOMINAL HYSTERECTOMY  1996   EYE SURGERY     JOINT REPLACEMENT  08/06/21   MASS EXCISION Right 2017   right arm mass removal   THYROID  SURGERY     left side removal benign nodule   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   Procedure: RIGHT TOTAL HIP ARTHROPLASTY ANTERIOR APPROACH;  Surgeon: Jerri Kay HERO, MD;  Location: [facility] OR;  Service: Orthopedics;  Laterality:  Right;  3-C    FAMILY HISTORY: Family History  Problem Relation Age of Onset   Stroke Mother    Hypertension Mother    Heart disease Mother    Heart attack Father    Early death Father    Hypertension Father    Diabetes Sister    Early death Sister    Hypertension Sister    Stroke Sister    Diabetes Sister    Early death Sister    Hyperlipidemia Sister    Kidney disease Sister    Diabetes Sister    Hyperlipidemia Sister    Hypertension Sister    Diabetes Sister    Early death Brother    Arthritis Brother    Heart attack Brother    Heart disease Brother    Arthritis Brother    Memory loss Paternal Uncle    Depression Daughter    Hypertension Daughter    Hyperlipidemia Daughter    Prostate cancer Son    Cancer Son    Early death Son    Cancer Niece        gyn cancer   Colon polyps Neg Hx    Colon cancer Neg Hx    Esophageal cancer Neg Hx    Rectal cancer Neg Hx    Stomach cancer Neg Hx    Sleep apnea Neg Hx     SOCIAL HISTORY: Social History   Socioeconomic History   Marital status: Divorced    Spouse name: Not on file   Number of children: 6   Years of education: some college   Highest education level: Some college, no degree  Occupational History   Occupation: Retired   Tobacco Use   Smoking status: Former    Current packs/day: 0.00    Average packs/day: 0.3 packs/day for 1 year (0.3 ttl pk-yrs)    Types: Cigarettes    Start date: 51    Quit date: 1960    Years since quitting: 66.1   Smokeless tobacco: Never   Tobacco comments:    Smoked briefly in my twenties  Vaping Use   Vaping status: Never Used  Substance and Sexual Activity   Alcohol use: Not Currently   Drug use: Never   Sexual activity: Not Currently    Birth control/protection: Abstinence  Other Topics Concern   Not on file  Social History Narrative   03/16/20 lives with dgtr Juanna   Mother of 6   --Two children have passed, one from from prostate cancer and one from liver  failure   Used to be a LAWYER, Programmer, Systems   Recently  moved back to [place]   Social Drivers of Health   Tobacco Use: Medium Risk (10/28/2024)   Patient History    Smoking Tobacco Use: Former    Smokeless Tobacco Use: Never    Passive Exposure: Not on file  Financial Resource Strain: Low Risk (09/15/2024)   Overall Financial Resource Strain (CARDIA)    Difficulty of Paying Living Expenses: Not very hard  Food Insecurity: No Food Insecurity (10/28/2024)   Epic    Worried About Programme Researcher, Broadcasting/film/video in the Last Year: Never true    Ran Out of Food in the Last Year: Never true  Transportation Needs: No Transportation Needs (10/28/2024)   Epic    Lack of Transportation (Medical): No    Lack of Transportation (Non-Medical): No  Physical Activity: Inactive (10/28/2024)   Exercise Vital Sign    Days of Exercise per Week: 0 days    Minutes of Exercise per Session: 0 min  Stress: No Stress Concern Present (10/28/2024)   [employer] of Occupational Health - Occupational Stress Questionnaire    Feeling of Stress: Not at all  Social Connections: Moderately Isolated (10/28/2024)   Social Connection and Isolation Panel    Frequency of Communication with Friends and Family: Once a week    Frequency of Social Gatherings with Friends and Family: More than three times a week    Attends Religious Services: More than 4 times per year    Active Member of Clubs or Organizations: No    Attends Banker Meetings: Never    Marital Status: Divorced  Catering Manager Violence: Not At Risk (10/28/2024)   Epic    Fear of Current or Ex-Partner: No    Emotionally Abused: No    Physically Abused: No    Sexually Abused: No  Depression (PHQ2-9): Low Risk (10/28/2024)   Depression (PHQ2-9)    PHQ-2 Score: 0  Alcohol Screen: Low Risk (10/28/2024)   Alcohol Screen    Last Alcohol Screening Score (AUDIT): 0  Housing: Low Risk (10/28/2024)   Epic    Unable to Pay for Housing in the Last Year: No    Number of  Times Moved in the Last Year: 0    Homeless in the Last Year: No  Utilities: Not At Risk (10/28/2024)   Epic    Threatened with loss of utilities: No  Health Literacy: Adequate Health Literacy (10/28/2024)   B1300 Health Literacy    Frequency of need for help with medical instructions: Never      PHYSICAL EXAM  Vitals:   11/18/24 1142  BP: 128/64  Pulse: (!) 51  Weight: 133 lb 6.4 oz (60.5 kg)  Height: 5' 6 (1.676 m)   Body mass index is 21.53 kg/m.  Generalized: Well developed, in no acute distress  Chest: Lungs clear to auscultation bilaterally  Neurological examination  Mentation: Alert oriented to time, place, history taking. Follows all commands speech and language fluent Cranial nerve II-XII: Extraocular movements were full, visual field were full on confrontational test Head turning and shoulder shrug  were normal and symmetric. Motor: The motor testing reveals 5 over 5 strength of all 4 extremities. Good symmetric motor tone is noted throughout.  Sensory: Sensory testing is intact to soft touch on all 4 extremities. No evidence of extinction is noted.  Gait and station: Gait is normal.    DIAGNOSTIC DATA (LABS, IMAGING, TESTING) - I reviewed patient records, labs, notes, testing and imaging myself where available.  Lab Results  Component Value  Date   WBC 2.3 (L) 10/29/2024   HGB 11.2 (L) 10/29/2024   HCT 33.7 (L) 10/29/2024   MCV 90.1 10/29/2024   PLT 137 (L) 10/29/2024      Component Value Date/Time   NA 139 06/17/2024 1152   NA 140 03/17/2024 0000   K 4.2 06/17/2024 1152   CL 103 06/17/2024 1152   CO2 29 06/17/2024 1152   GLUCOSE 143 (H) 06/17/2024 1152   BUN 29 (H) 06/17/2024 1152   BUN 21 03/17/2024 0000   CREATININE 1.78 (H) 06/17/2024 1152   CREATININE 1.44 (H) 06/14/2020 1353   CALCIUM  9.3 06/17/2024 1152   CALCIUM  9.9 03/17/2024 0000   PROT 6.7 06/17/2024 1152   ALBUMIN 4.1 06/17/2024 1152   AST 19 06/17/2024 1152   ALT 17 06/17/2024 1152    ALKPHOS 43 06/17/2024 1152   BILITOT 0.5 06/17/2024 1152   GFRNONAA 42 (L) 08/07/2021 0504   GFRAA 38 10/16/2021 0000   Lab Results  Component Value Date   CHOL 178 06/17/2024   HDL 60.60 06/17/2024   LDLCALC 91 06/17/2024   TRIG 135.0 06/17/2024   CHOLHDL 3 06/17/2024   Lab Results  Component Value Date   HGBA1C 6.8 (A) 09/16/2024   Lab Results  Component Value Date   VITAMINB12 505 07/26/2024   Lab Results  Component Value Date   TSH 1.68 03/17/2024      ASSESSMENT AND PLAN 79 y.o. year old female  has a past medical history of Anemia, Arthritis, Cataract (2020), Chronic kidney disease, Diabetes mellitus without complication (HCC), Heart murmur, Hypertension, Iron deficiency, Mixed hyperlipidemia, OSA on CPAP, Sleep apnea, and Thyroid  nodule. here with:  OSA on CPAP  - CPAP compliance excellent - Good treatment of AHI  - Encourage patient to use CPAP nightly and > 4 hours each night - F/U in 1 year or sooner if needed    Duwaine Russell, MSN, NP-C 11/18/2024, 9:22 AM [facility] [address], [address] [place], [place] [address] [phone]      [1]  Allergies Allergen Reactions   Lisinopril Rash   Metformin  And Related Swelling   "

## 2024-11-18 NOTE — Patient Instructions (Addendum)
 Continue using CPAP nightly and greater than 4 hours each night If your symptoms worsen or you develop new symptoms please let us  know.

## 2025-01-17 ENCOUNTER — Ambulatory Visit: Admitting: Physician Assistant

## 2025-01-27 ENCOUNTER — Inpatient Hospital Stay

## 2025-01-27 ENCOUNTER — Inpatient Hospital Stay: Admitting: Nurse Practitioner

## 2025-11-01 ENCOUNTER — Ambulatory Visit

## 2025-11-17 ENCOUNTER — Ambulatory Visit: Admitting: Adult Health
